# Patient Record
Sex: Female | Born: 1990 | Race: Black or African American | Hispanic: No | Marital: Single | Smoking: Current every day smoker
Health system: Southern US, Community
[De-identification: ages and names within clinical notes are randomized; demographics above are authoritative.]

## PROBLEM LIST (undated history)

## (undated) ENCOUNTER — Inpatient Hospital Stay (HOSPITAL_COMMUNITY): Payer: Self-pay

## (undated) ENCOUNTER — Emergency Department (HOSPITAL_COMMUNITY): Payer: MEDICAID

## (undated) DIAGNOSIS — Z98891 History of uterine scar from previous surgery: Secondary | ICD-10-CM

## (undated) DIAGNOSIS — E119 Type 2 diabetes mellitus without complications: Secondary | ICD-10-CM

## (undated) DIAGNOSIS — J302 Other seasonal allergic rhinitis: Secondary | ICD-10-CM

## (undated) DIAGNOSIS — F1290 Cannabis use, unspecified, uncomplicated: Secondary | ICD-10-CM

## (undated) DIAGNOSIS — J4530 Mild persistent asthma, uncomplicated: Secondary | ICD-10-CM

## (undated) DIAGNOSIS — F419 Anxiety disorder, unspecified: Secondary | ICD-10-CM

## (undated) DIAGNOSIS — R4589 Other symptoms and signs involving emotional state: Secondary | ICD-10-CM

## (undated) DIAGNOSIS — F332 Major depressive disorder, recurrent severe without psychotic features: Secondary | ICD-10-CM

## (undated) DIAGNOSIS — F329 Major depressive disorder, single episode, unspecified: Secondary | ICD-10-CM

## (undated) DIAGNOSIS — Z6791 Unspecified blood type, Rh negative: Secondary | ICD-10-CM

## (undated) DIAGNOSIS — F319 Bipolar disorder, unspecified: Secondary | ICD-10-CM

## (undated) DIAGNOSIS — O0990 Supervision of high risk pregnancy, unspecified, unspecified trimester: Secondary | ICD-10-CM

## (undated) DIAGNOSIS — O24113 Pre-existing diabetes mellitus, type 2, in pregnancy, third trimester: Secondary | ICD-10-CM

## (undated) DIAGNOSIS — Z720 Tobacco use: Secondary | ICD-10-CM

## (undated) HISTORY — PX: NO PAST SURGERIES: SHX2092

---
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
## 2009-04-22 ENCOUNTER — Inpatient Hospital Stay (HOSPITAL_COMMUNITY): Admission: AD | Admit: 2009-04-22 | Discharge: 2009-04-22 | Payer: Self-pay | Admitting: Obstetrics & Gynecology

## 2009-04-23 ENCOUNTER — Encounter: Payer: Self-pay | Admitting: Physician Assistant

## 2009-04-23 ENCOUNTER — Ambulatory Visit (HOSPITAL_COMMUNITY): Admission: RE | Admit: 2009-04-23 | Discharge: 2009-04-23 | Payer: Self-pay | Admitting: Obstetrics & Gynecology

## 2009-04-23 ENCOUNTER — Encounter (INDEPENDENT_AMBULATORY_CARE_PROVIDER_SITE_OTHER): Payer: Self-pay | Admitting: Family Medicine

## 2009-04-23 ENCOUNTER — Ambulatory Visit: Payer: Self-pay | Admitting: Obstetrics and Gynecology

## 2009-04-23 LAB — CONVERTED CEMR LAB
Basophils Relative: 0 % (ref 0–1)
Eosinophils Absolute: 0.2 10*3/uL (ref 0.0–0.7)
Eosinophils Relative: 2 % (ref 0–5)
HCT: 32 % — ABNORMAL LOW (ref 36.0–46.0)
Hepatitis B Surface Ag: NEGATIVE
Hgb F Quant: 0 % (ref 0.0–2.0)
Lymphs Abs: 1.9 10*3/uL (ref 0.7–4.0)
MCV: 89.6 fL (ref 78.0–100.0)
Neutro Abs: 7.9 10*3/uL — ABNORMAL HIGH (ref 1.7–7.7)
Neutrophils Relative %: 72 % (ref 43–77)
Rubella: 45.8 intl units/mL — ABNORMAL HIGH
WBC: 10.9 10*3/uL — ABNORMAL HIGH (ref 4.0–10.5)

## 2009-04-27 ENCOUNTER — Other Ambulatory Visit: Payer: Self-pay | Admitting: Emergency Medicine

## 2009-04-27 ENCOUNTER — Ambulatory Visit: Payer: Self-pay | Admitting: Obstetrics and Gynecology

## 2009-04-27 ENCOUNTER — Inpatient Hospital Stay (HOSPITAL_COMMUNITY): Admission: AD | Admit: 2009-04-27 | Discharge: 2009-04-27 | Payer: Self-pay | Admitting: Obstetrics & Gynecology

## 2009-05-02 ENCOUNTER — Inpatient Hospital Stay (HOSPITAL_COMMUNITY): Admission: AD | Admit: 2009-05-02 | Discharge: 2009-05-04 | Payer: Self-pay | Admitting: Obstetrics & Gynecology

## 2009-05-02 ENCOUNTER — Ambulatory Visit: Payer: Self-pay | Admitting: Obstetrics and Gynecology

## 2009-05-05 ENCOUNTER — Encounter: Admission: RE | Admit: 2009-05-05 | Discharge: 2009-05-13 | Payer: Self-pay | Admitting: Obstetrics and Gynecology

## 2009-05-05 ENCOUNTER — Ambulatory Visit: Payer: Self-pay | Admitting: Obstetrics & Gynecology

## 2009-05-05 ENCOUNTER — Encounter: Payer: Self-pay | Admitting: Family

## 2009-05-05 LAB — CONVERTED CEMR LAB: GC Probe Amp, Urine: NEGATIVE

## 2009-05-06 ENCOUNTER — Encounter: Payer: Self-pay | Admitting: Family

## 2009-05-09 ENCOUNTER — Ambulatory Visit: Payer: Self-pay | Admitting: Obstetrics & Gynecology

## 2009-05-12 ENCOUNTER — Encounter: Payer: Self-pay | Admitting: Obstetrics & Gynecology

## 2009-05-12 ENCOUNTER — Ambulatory Visit: Payer: Self-pay | Admitting: Obstetrics & Gynecology

## 2009-05-13 ENCOUNTER — Ambulatory Visit: Payer: Self-pay | Admitting: Family Medicine

## 2009-05-13 ENCOUNTER — Inpatient Hospital Stay (HOSPITAL_COMMUNITY): Admission: AD | Admit: 2009-05-13 | Discharge: 2009-05-13 | Payer: Self-pay | Admitting: Family Medicine

## 2009-05-16 ENCOUNTER — Ambulatory Visit: Payer: Self-pay | Admitting: Obstetrics & Gynecology

## 2009-05-19 ENCOUNTER — Ambulatory Visit: Payer: Self-pay | Admitting: Obstetrics & Gynecology

## 2009-05-23 ENCOUNTER — Ambulatory Visit: Payer: Self-pay | Admitting: Family Medicine

## 2009-05-23 ENCOUNTER — Ambulatory Visit: Payer: Self-pay | Admitting: Obstetrics & Gynecology

## 2009-05-23 ENCOUNTER — Ambulatory Visit (HOSPITAL_COMMUNITY): Admission: RE | Admit: 2009-05-23 | Discharge: 2009-05-23 | Payer: Self-pay | Admitting: Obstetrics & Gynecology

## 2009-05-23 ENCOUNTER — Inpatient Hospital Stay (HOSPITAL_COMMUNITY): Admission: AD | Admit: 2009-05-23 | Discharge: 2009-05-26 | Payer: Self-pay | Admitting: Obstetrics & Gynecology

## 2009-10-28 ENCOUNTER — Emergency Department (HOSPITAL_COMMUNITY): Admission: EM | Admit: 2009-10-28 | Discharge: 2009-10-29 | Payer: Self-pay | Admitting: Emergency Medicine

## 2010-03-29 ENCOUNTER — Encounter: Payer: Self-pay | Admitting: Family Medicine

## 2010-05-22 LAB — CBC
HCT: 36.4 % (ref 36.0–46.0)
MCHC: 34.1 g/dL (ref 30.0–36.0)
MCV: 85.2 fL (ref 78.0–100.0)
RDW: 14.3 % (ref 11.5–15.5)
WBC: 10.8 10*3/uL — ABNORMAL HIGH (ref 4.0–10.5)

## 2010-05-22 LAB — DIFFERENTIAL
Basophils Absolute: 0 10*3/uL (ref 0.0–0.1)
Basophils Relative: 0 % (ref 0–1)
Eosinophils Relative: 3 % (ref 0–5)
Lymphocytes Relative: 33 % (ref 12–46)
Neutrophils Relative %: 58 % (ref 43–77)

## 2010-05-22 LAB — POCT I-STAT, CHEM 8
BUN: 8 mg/dL (ref 6–23)
Glucose, Bld: 119 mg/dL — ABNORMAL HIGH (ref 70–99)

## 2010-05-26 ENCOUNTER — Emergency Department (HOSPITAL_COMMUNITY): Payer: Medicaid Other

## 2010-05-26 ENCOUNTER — Emergency Department (HOSPITAL_COMMUNITY)
Admission: EM | Admit: 2010-05-26 | Discharge: 2010-05-26 | Disposition: A | Discharge status: Home / Self Care | Payer: Medicaid Other | Attending: Emergency Medicine | Admitting: Emergency Medicine

## 2010-05-26 DIAGNOSIS — J40 Bronchitis, not specified as acute or chronic: Secondary | ICD-10-CM | POA: Insufficient documentation

## 2010-05-26 DIAGNOSIS — R112 Nausea with vomiting, unspecified: Secondary | ICD-10-CM | POA: Insufficient documentation

## 2010-05-26 DIAGNOSIS — R0989 Other specified symptoms and signs involving the circulatory and respiratory systems: Secondary | ICD-10-CM | POA: Insufficient documentation

## 2010-05-26 DIAGNOSIS — R0609 Other forms of dyspnea: Secondary | ICD-10-CM | POA: Insufficient documentation

## 2010-05-26 DIAGNOSIS — Z79899 Other long term (current) drug therapy: Secondary | ICD-10-CM | POA: Insufficient documentation

## 2010-05-26 DIAGNOSIS — R059 Cough, unspecified: Secondary | ICD-10-CM | POA: Insufficient documentation

## 2010-05-26 DIAGNOSIS — F3289 Other specified depressive episodes: Secondary | ICD-10-CM | POA: Insufficient documentation

## 2010-05-26 DIAGNOSIS — R0602 Shortness of breath: Secondary | ICD-10-CM | POA: Insufficient documentation

## 2010-05-26 DIAGNOSIS — R05 Cough: Secondary | ICD-10-CM | POA: Insufficient documentation

## 2010-05-26 DIAGNOSIS — J45901 Unspecified asthma with (acute) exacerbation: Secondary | ICD-10-CM | POA: Insufficient documentation

## 2010-05-26 DIAGNOSIS — E119 Type 2 diabetes mellitus without complications: Secondary | ICD-10-CM | POA: Insufficient documentation

## 2010-05-26 DIAGNOSIS — F329 Major depressive disorder, single episode, unspecified: Secondary | ICD-10-CM | POA: Insufficient documentation

## 2010-05-26 DIAGNOSIS — Z794 Long term (current) use of insulin: Secondary | ICD-10-CM | POA: Insufficient documentation

## 2010-05-26 LAB — DIFFERENTIAL
Basophils Absolute: 0.1 10*3/uL (ref 0.0–0.1)
Eosinophils Absolute: 0.4 10*3/uL (ref 0.0–0.7)
Lymphocytes Relative: 29 % (ref 12–46)
Monocytes Absolute: 0.9 10*3/uL (ref 0.1–1.0)
Monocytes Relative: 11 % (ref 3–12)
Neutro Abs: 4.4 10*3/uL (ref 1.7–7.7)

## 2010-05-26 LAB — URINALYSIS, ROUTINE W REFLEX MICROSCOPIC
Glucose, UA: NEGATIVE mg/dL
Hgb urine dipstick: NEGATIVE
Specific Gravity, Urine: 1.015 (ref 1.005–1.030)
pH: 6.5 (ref 5.0–8.0)

## 2010-05-26 LAB — BASIC METABOLIC PANEL
BUN: 4 mg/dL — ABNORMAL LOW (ref 6–23)
CO2: 24 mEq/L (ref 19–32)

## 2010-05-26 LAB — CBC
MCH: 25.6 pg — ABNORMAL LOW (ref 26.0–34.0)
MCV: 79.2 fL (ref 78.0–100.0)
Platelets: 362 10*3/uL (ref 150–400)
RBC: 4.18 MIL/uL (ref 3.87–5.11)
WBC: 8.1 10*3/uL (ref 4.0–10.5)

## 2010-05-26 LAB — URINE MICROSCOPIC-ADD ON

## 2010-05-26 LAB — POCT PREGNANCY, URINE: Preg Test, Ur: NEGATIVE

## 2010-05-27 LAB — URINE CULTURE
Colony Count: 75000
Culture  Setup Time: 201203201444

## 2010-05-27 LAB — GLUCOSE, CAPILLARY
Glucose-Capillary: 108 mg/dL — ABNORMAL HIGH (ref 70–99)
Glucose-Capillary: 128 mg/dL — ABNORMAL HIGH (ref 70–99)
Glucose-Capillary: 138 mg/dL — ABNORMAL HIGH (ref 70–99)
Glucose-Capillary: 178 mg/dL — ABNORMAL HIGH (ref 70–99)
Glucose-Capillary: 178 mg/dL — ABNORMAL HIGH (ref 70–99)
Glucose-Capillary: 204 mg/dL — ABNORMAL HIGH (ref 70–99)
Glucose-Capillary: 212 mg/dL — ABNORMAL HIGH (ref 70–99)

## 2010-05-27 LAB — POCT URINALYSIS DIP (DEVICE)
Bilirubin Urine: NEGATIVE
Glucose, UA: 250 mg/dL — AB
Nitrite: NEGATIVE
Protein, ur: 100 mg/dL — AB
Protein, ur: 100 mg/dL — AB
Specific Gravity, Urine: 1.02 (ref 1.005–1.030)
Specific Gravity, Urine: 1.02 (ref 1.005–1.030)

## 2010-05-27 LAB — RH IMMUNE GLOBULIN WORKUP (NOT WOMEN'S HOSP): ABO/RH(D): A NEG

## 2010-05-27 LAB — CBC
Hemoglobin: 11.1 g/dL — ABNORMAL LOW (ref 12.0–15.0)
MCHC: 34.1 g/dL (ref 30.0–36.0)
MCV: 91.6 fL (ref 78.0–100.0)
RBC: 3.57 MIL/uL — ABNORMAL LOW (ref 3.87–5.11)
WBC: 6.5 10*3/uL (ref 4.0–10.5)

## 2010-05-29 LAB — POCT I-STAT, CHEM 8
Calcium, Ion: 1.07 mmol/L — ABNORMAL LOW (ref 1.12–1.32)
Glucose, Bld: 157 mg/dL — ABNORMAL HIGH (ref 70–99)
HCT: 32 % — ABNORMAL LOW (ref 36.0–46.0)
Hemoglobin: 10.9 g/dL — ABNORMAL LOW (ref 12.0–15.0)
Potassium: 3.8 mEq/L (ref 3.5–5.1)

## 2010-05-29 LAB — URINALYSIS, ROUTINE W REFLEX MICROSCOPIC
Bilirubin Urine: NEGATIVE
Ketones, ur: NEGATIVE mg/dL
Nitrite: NEGATIVE
Specific Gravity, Urine: 1.022 (ref 1.005–1.030)
Urobilinogen, UA: 0.2 mg/dL (ref 0.0–1.0)

## 2010-05-29 LAB — CBC
MCHC: 34.6 g/dL (ref 30.0–36.0)
MCV: 92.8 fL (ref 78.0–100.0)
RBC: 3.64 MIL/uL — ABNORMAL LOW (ref 3.87–5.11)
RDW: 13.8 % (ref 11.5–15.5)

## 2010-05-29 LAB — DIFFERENTIAL
Basophils Absolute: 0 10*3/uL (ref 0.0–0.1)
Basophils Relative: 0 % (ref 0–1)
Eosinophils Absolute: 0.1 10*3/uL (ref 0.0–0.7)
Monocytes Relative: 4 % (ref 3–12)
Neutro Abs: 9.1 10*3/uL — ABNORMAL HIGH (ref 1.7–7.7)
Neutrophils Relative %: 89 % — ABNORMAL HIGH (ref 43–77)

## 2010-05-29 LAB — GLUCOSE, CAPILLARY: Glucose-Capillary: 128 mg/dL — ABNORMAL HIGH (ref 70–99)

## 2010-05-31 LAB — GLUCOSE, CAPILLARY
Glucose-Capillary: 109 mg/dL — ABNORMAL HIGH (ref 70–99)
Glucose-Capillary: 110 mg/dL — ABNORMAL HIGH (ref 70–99)
Glucose-Capillary: 120 mg/dL — ABNORMAL HIGH (ref 70–99)
Glucose-Capillary: 129 mg/dL — ABNORMAL HIGH (ref 70–99)
Glucose-Capillary: 133 mg/dL — ABNORMAL HIGH (ref 70–99)
Glucose-Capillary: 138 mg/dL — ABNORMAL HIGH (ref 70–99)
Glucose-Capillary: 160 mg/dL — ABNORMAL HIGH (ref 70–99)
Glucose-Capillary: 162 mg/dL — ABNORMAL HIGH (ref 70–99)
Glucose-Capillary: 189 mg/dL — ABNORMAL HIGH (ref 70–99)
Glucose-Capillary: 243 mg/dL — ABNORMAL HIGH (ref 70–99)
Glucose-Capillary: 244 mg/dL — ABNORMAL HIGH (ref 70–99)
Glucose-Capillary: 86 mg/dL (ref 70–99)
Glucose-Capillary: 98 mg/dL (ref 70–99)

## 2010-05-31 LAB — RH IMMUNE GLOB WKUP(>/=20WKS)(NOT WOMEN'S HOSP)

## 2010-05-31 LAB — POCT URINALYSIS DIP (DEVICE)
Bilirubin Urine: NEGATIVE
Glucose, UA: 100 mg/dL — AB
Ketones, ur: NEGATIVE mg/dL
Ketones, ur: NEGATIVE mg/dL
Nitrite: NEGATIVE
Protein, ur: 30 mg/dL — AB
Urobilinogen, UA: 0.2 mg/dL (ref 0.0–1.0)
pH: 7 (ref 5.0–8.0)

## 2010-05-31 LAB — WET PREP, GENITAL
Clue Cells Wet Prep HPF POC: NONE SEEN
Trich, Wet Prep: NONE SEEN
Yeast Wet Prep HPF POC: NONE SEEN

## 2010-05-31 LAB — CBC
HCT: 33.7 % — ABNORMAL LOW (ref 36.0–46.0)
Platelets: 248 10*3/uL (ref 150–400)
WBC: 9.4 10*3/uL (ref 4.0–10.5)

## 2010-06-12 LAB — POCT I-STAT, CHEM 8
Hemoglobin: 12.2 g/dL (ref 12.0–15.0)
Sodium: 137 mEq/L (ref 135–145)
TCO2: 22 mmol/L (ref 0–100)

## 2010-06-12 LAB — URINALYSIS, ROUTINE W REFLEX MICROSCOPIC
Ketones, ur: NEGATIVE mg/dL
Nitrite: NEGATIVE
Protein, ur: NEGATIVE mg/dL
pH: 6.5 (ref 5.0–8.0)

## 2010-06-12 LAB — URINE CULTURE: Colony Count: NO GROWTH

## 2010-06-12 LAB — URINE MICROSCOPIC-ADD ON

## 2010-06-18 LAB — CBC
HCT: 33.7 % — ABNORMAL LOW (ref 36.0–46.0)
Hemoglobin: 11.4 g/dL — ABNORMAL LOW (ref 12.0–15.0)
MCHC: 33.8 g/dL (ref 30.0–36.0)
Platelets: 252 10*3/uL (ref 150–400)
RDW: 13.9 % (ref 11.5–15.5)
WBC: 9.1 10*3/uL (ref 4.0–10.5)

## 2010-06-18 LAB — GLUCOSE, CAPILLARY
Glucose-Capillary: 117 mg/dL — ABNORMAL HIGH (ref 70–99)
Glucose-Capillary: 117 mg/dL — ABNORMAL HIGH (ref 70–99)
Glucose-Capillary: 189 mg/dL — ABNORMAL HIGH (ref 70–99)
Glucose-Capillary: 284 mg/dL — ABNORMAL HIGH (ref 70–99)
Glucose-Capillary: 87 mg/dL (ref 70–99)

## 2010-06-18 LAB — RH IMMUNE GLOB WKUP(>/=20WKS)(NOT WOMEN'S HOSP)

## 2010-06-18 LAB — RAPID HIV SCREEN (WH-MAU): Rapid HIV Screen: NONREACTIVE

## 2010-06-18 LAB — LSPG (L/S RATIO WITH PG)-AMNIO FLUID

## 2010-06-18 LAB — GLUCOSE, RANDOM: Glucose, Bld: 290 mg/dL — ABNORMAL HIGH (ref 70–99)

## 2010-06-18 LAB — RPR: RPR Ser Ql: NONREACTIVE

## 2010-06-22 LAB — RH IMMUNE GLOBULIN WORKUP (NOT WOMEN'S HOSP): Antibody Screen: NEGATIVE

## 2010-06-23 LAB — COMPREHENSIVE METABOLIC PANEL
ALT: 15 U/L (ref 0–35)
Alkaline Phosphatase: 66 U/L (ref 39–117)
CO2: 23 mEq/L (ref 19–32)
Calcium: 9.1 mg/dL (ref 8.4–10.5)
Chloride: 106 mEq/L (ref 96–112)
GFR calc non Af Amer: >60 mL/min (ref >60)
Glucose, Bld: 145 mg/dL — ABNORMAL HIGH (ref 70–99)
Potassium: 3.7 mEq/L (ref 3.5–5.1)
Sodium: 135 mEq/L (ref 135–145)
Total Bilirubin: <0.1 mg/dL — ABNORMAL LOW (ref 0.3–1.2)

## 2010-06-23 LAB — GLUCOSE, CAPILLARY
Glucose-Capillary: 109 mg/dL — ABNORMAL HIGH (ref 70–99)
Glucose-Capillary: 126 mg/dL — ABNORMAL HIGH (ref 70–99)
Glucose-Capillary: 137 mg/dL — ABNORMAL HIGH (ref 70–99)
Glucose-Capillary: 141 mg/dL — ABNORMAL HIGH (ref 70–99)
Glucose-Capillary: 82 mg/dL (ref 70–99)

## 2010-06-23 LAB — CBC
Hemoglobin: 10.5 g/dL — ABNORMAL LOW (ref 12.0–15.0)
MCHC: 33.9 g/dL (ref 30.0–36.0)
RBC: 3.28 MIL/uL — ABNORMAL LOW (ref 3.87–5.11)
WBC: 9.3 10*3/uL (ref 4.0–10.5)

## 2010-06-23 LAB — HEMOGLOBIN A1C: Hgb A1c MFr Bld: 6.6 % — ABNORMAL HIGH (ref 4.6–6.1)

## 2010-07-21 NOTE — Discharge Summary (Signed)
Grace Montgomery, Grace Montgomery              ACCOUNT NO.:  [id number]   MEDICAL RECORD NO.:  [id number]         PATIENT TYPE:  WINP   LOCATION:                                FACILITY:  [facility]   PHYSICIAN:  Kathreen Cosier, M.D.DATE OF BIRTH:  1990/07/07   DATE OF ADMISSION:  04/27/2008  DATE OF DISCHARGE:                               DISCHARGE SUMMARY   The patient is an 20 year old gravida 2, para 1-0-0-1, [facility] June 03, 2008.  The patient is a known diabetic, was seen at [facility].  On  the day of admission, she was seen in [facility] and she was given  prescription for her insulin.  The patient states she was unable to  afford medication and was sent to the hospital by the group home where  she lived.  While hospitalized, her blood sugars were under control.  She had a social work consult.  She was on 40 units of Lantus at bedtime  and 10 units of Humalog with meals and we were able to arrange for her  to have her medication prior to discharge on April 29, 2008.   DISCHARGE DIAGNOSIS:  Intrauterine pregnancy with gestational diabetes.           ______________________________  Kathreen Cosier, M.D.     BAM/MEDQ  D:  05/22/2008  T:  05/22/2008  Job:  [id number]

## 2010-08-07 ENCOUNTER — Emergency Department (HOSPITAL_COMMUNITY)
Admission: EM | Admit: 2010-08-07 | Discharge: 2010-08-07 | Discharge status: Against Medical Advice | Payer: Self-pay | Attending: Emergency Medicine | Admitting: Emergency Medicine

## 2010-08-07 DIAGNOSIS — R071 Chest pain on breathing: Secondary | ICD-10-CM | POA: Insufficient documentation

## 2010-12-11 LAB — CBC
HCT: 33.7 % — ABNORMAL LOW (ref 36.0–46.0)
MCHC: 34.7 g/dL (ref 30.0–36.0)
MCV: 94.3 fL (ref 78.0–100.0)
RBC: 3.57 MIL/uL — ABNORMAL LOW (ref 3.87–5.11)
WBC: 9.8 10*3/uL (ref 4.0–10.5)

## 2010-12-11 LAB — URINALYSIS, ROUTINE W REFLEX MICROSCOPIC
Hgb urine dipstick: NEGATIVE
Ketones, ur: 40 mg/dL — AB
Protein, ur: 30 mg/dL — AB
Urobilinogen, UA: 0.2 mg/dL (ref 0.0–1.0)

## 2010-12-11 LAB — COMPREHENSIVE METABOLIC PANEL
BUN: 5 mg/dL — ABNORMAL LOW (ref 6–23)
CO2: 21 mEq/L (ref 19–32)
Chloride: 104 mEq/L (ref 96–112)
Creatinine, Ser: 0.69 mg/dL (ref 0.4–1.2)
GFR calc non Af Amer: >60 mL/min (ref >60)
Total Bilirubin: 0.7 mg/dL (ref 0.3–1.2)

## 2010-12-11 LAB — WET PREP, GENITAL
Trich, Wet Prep: NONE SEEN
Yeast Wet Prep HPF POC: NONE SEEN

## 2010-12-11 LAB — URINE MICROSCOPIC-ADD ON

## 2010-12-22 LAB — RH IMMUNE GLOBULIN WORKUP (NOT WOMEN'S HOSP)
ABO/RH(D): A NEG
Antibody Screen: NEGATIVE

## 2010-12-22 LAB — CBC
HCT: 34.2 — ABNORMAL LOW
Hemoglobin: 11.7 — ABNORMAL LOW
MCHC: 34.1
MCV: 95.8
Platelets: 260
RBC: 3.57 — ABNORMAL LOW
RDW: 13.3
WBC: 11.1 — ABNORMAL HIGH

## 2010-12-22 LAB — KLEIHAUER-BETKE STAIN: Quantitation Fetal Hemoglobin: 0

## 2010-12-23 LAB — URINALYSIS, ROUTINE W REFLEX MICROSCOPIC
Glucose, UA: 100 — AB
Hgb urine dipstick: NEGATIVE
Ketones, ur: 15 — AB
Nitrite: NEGATIVE
Protein, ur: NEGATIVE
Specific Gravity, Urine: 1.01
pH: 7

## 2010-12-23 LAB — DIFFERENTIAL
Lymphs Abs: 1.8
Monocytes Relative: 7
Neutro Abs: 7.8 — ABNORMAL HIGH
Neutrophils Relative %: 73 — ABNORMAL HIGH

## 2010-12-23 LAB — HIV ANTIBODY (ROUTINE TESTING W REFLEX): HIV: NONREACTIVE

## 2010-12-23 LAB — CBC
RBC: 3.63 — ABNORMAL LOW
WBC: 10.8 — ABNORMAL HIGH

## 2010-12-23 LAB — ABO/RH: ABO/RH(D): A NEG

## 2010-12-23 LAB — WET PREP, GENITAL: Yeast Wet Prep HPF POC: NONE SEEN

## 2010-12-23 LAB — HEPATITIS B SURFACE ANTIGEN: Hepatitis B Surface Ag: NEGATIVE

## 2010-12-23 LAB — RPR
RPR Ser Ql: NONREACTIVE
RPR Ser Ql: NONREACTIVE

## 2011-02-19 ENCOUNTER — Encounter: Payer: Self-pay | Admitting: *Deleted

## 2011-02-19 ENCOUNTER — Emergency Department (HOSPITAL_COMMUNITY)
Admission: EM | Admit: 2011-02-19 | Discharge: 2011-02-19 | Disposition: A | Discharge status: Home / Self Care | Payer: Medicaid Other | Attending: Emergency Medicine | Admitting: Emergency Medicine

## 2011-02-19 DIAGNOSIS — R0981 Nasal congestion: Secondary | ICD-10-CM

## 2011-02-19 DIAGNOSIS — Z794 Long term (current) use of insulin: Secondary | ICD-10-CM | POA: Insufficient documentation

## 2011-02-19 DIAGNOSIS — J3489 Other specified disorders of nose and nasal sinuses: Secondary | ICD-10-CM | POA: Insufficient documentation

## 2011-02-19 DIAGNOSIS — R0789 Other chest pain: Secondary | ICD-10-CM | POA: Insufficient documentation

## 2011-02-19 DIAGNOSIS — E119 Type 2 diabetes mellitus without complications: Secondary | ICD-10-CM | POA: Insufficient documentation

## 2011-02-19 DIAGNOSIS — B9789 Other viral agents as the cause of diseases classified elsewhere: Secondary | ICD-10-CM | POA: Insufficient documentation

## 2011-02-19 DIAGNOSIS — F17200 Nicotine dependence, unspecified, uncomplicated: Secondary | ICD-10-CM | POA: Insufficient documentation

## 2011-02-19 DIAGNOSIS — B349 Viral infection, unspecified: Secondary | ICD-10-CM

## 2011-02-19 MED ORDER — MOMETASONE FUROATE 50 MCG/ACT NA SUSP: 2.0000 | Freq: Every day | NASAL | Status: DC

## 2011-02-19 MED ORDER — PSEUDOEPHEDRINE HCL 60 MG PO TABS: 60.0000 mg | ORAL_TABLET | ORAL | Status: AC | PRN

## 2011-02-19 NOTE — ED Provider Notes (Signed)
Medical screening examination/treatment/procedure(s) were performed by non-physician practitioner and as supervising physician I was immediately available for consultation/collaboration.  Ethelda Chick, MD 02/19/11 [phone]

## 2011-02-19 NOTE — ED Notes (Signed)
Pt states "for the past 2 days I have been a little congested & my chest feels a little tight, have nasal congestion & feeling a little dizzy, those spells come & go"; pt denies cough/n/v

## 2011-02-19 NOTE — ED Provider Notes (Signed)
History     CSN: [id number] Arrival date & time: 02/19/2011 10:32 AM   First MD Initiated Contact with Patient 02/19/11 1111      Chief Complaint  Patient presents with  . URI    (Consider location/radiation/quality/duration/timing/severity/associated sxs/prior treatment) HPI Comments: Patient states she's felt congestion for the last 4-5 days.  She does not have a primary care Dr.  She states that she's been having night sweats and chills.  She denies nausea, vomiting, diarrhea, headaches, change in vision, sore throat, cough and chest pain.  Patient is a 20 y.o. female presenting with URI. The history is provided by the patient.  URI Primary symptoms do not include fever, fatigue, headaches, ear pain, sore throat, swollen glands, cough, wheezing, abdominal pain, nausea, vomiting, myalgias, arthralgias or rash. The current episode started 3 to 5 days ago. This is a new problem. The problem has not changed since onset. The onset of the illness is associated with exposure to sick contacts. Symptoms associated with the illness include chills, congestion and rhinorrhea. The illness is not associated with plugged ear sensation, facial pain or sinus pressure.    Past Medical History  Diagnosis Date  . Diabetes mellitus   . Asthma     History reviewed. No pertinent past surgical history.  No family history on file.  History  Substance Use Topics  . Smoking status: Current Everyday Smoker -- 0.5 packs/day  . Smokeless tobacco: Not on file  . Alcohol Use: Yes     ocassionally    OB History    Grav Para Term Preterm Abortions TAB SAB Ect Mult Living                  Review of Systems  Constitutional: Positive for chills. Negative for fever, appetite change and fatigue.  HENT: Positive for congestion and rhinorrhea. Negative for hearing loss, ear pain, nosebleeds, sore throat, sneezing, trouble swallowing, neck stiffness, voice change, postnasal drip, sinus pressure, tinnitus  and ear discharge.   Eyes: Negative for photophobia and visual disturbance.  Respiratory: Positive for chest tightness. Negative for apnea, cough, choking, shortness of breath, wheezing and stridor.   Cardiovascular: Negative for chest pain, palpitations and leg swelling.  Gastrointestinal: Negative for nausea, vomiting, abdominal pain, diarrhea and constipation.  Genitourinary: Negative for dysuria, urgency and flank pain.  Musculoskeletal: Negative for myalgias and arthralgias.  Skin: Negative for rash.  Neurological: Negative for dizziness, seizures, syncope, weakness, light-headedness, numbness and headaches.  Psychiatric/Behavioral: Negative for behavioral problems and confusion.  All other systems reviewed and are negative.    Allergies  Chocolate  Home Medications   Current Outpatient Rx  Name Route Sig Dispense Refill  . ALBUTEROL 90 MCG/ACT IN AERS Inhalation Inhale 2 puffs into the lungs every 6 (six) hours as needed. wheezing     . INSULIN ASPART 100 UNIT/ML [place] SOLN Subcutaneous Inject 36-42 Units into the skin 3 (three) times daily before meals. 42 units in the morning,24 units in the afternoon,36 units before supper     . INSULIN GLARGINE 100 UNIT/ML [place] SOLN Subcutaneous Inject 42 Units into the skin daily.        BP 124/64  Pulse 83  Temp(Src) 98.9 F (37.2 C) (Oral)  Resp 16  Wt 198 lb (89.812 kg)  SpO2 100%  LMP 02/16/2011  Physical Exam  Nursing note and vitals reviewed. Constitutional: She is oriented to person, place, and time. She appears well-developed and well-nourished. No distress.  HENT:  Head: Normocephalic and  atraumatic. No trismus in the jaw.  Right Ear: Tympanic membrane, external ear and ear canal normal.  Left Ear: Tympanic membrane, external ear and ear canal normal.  Nose: Rhinorrhea present. No sinus tenderness or nasal deformity. Right sinus exhibits no maxillary sinus tenderness and no frontal sinus tenderness. Left sinus exhibits no  maxillary sinus tenderness and no frontal sinus tenderness.  Mouth/Throat: Uvula is midline and mucous membranes are normal. No oropharyngeal exudate, posterior oropharyngeal edema or posterior oropharyngeal erythema.  Eyes:       Normal appearance  Neck: Normal range of motion. Neck supple.  Cardiovascular: Normal rate and regular rhythm.   Pulmonary/Chest: Effort normal and breath sounds normal.  Abdominal: There is no tenderness.  Lymphadenopathy:    She has no cervical adenopathy.  Neurological: She is alert and oriented to person, place, and time.  Skin: Skin is warm and dry. No rash noted.  Psychiatric: She has a normal mood and affect. Her behavior is normal.    ED Course  Procedures (including critical care time)  Labs Reviewed - No data to display No results found.   No diagnosis found.  Patient's lungs are clear to auscultation anteriorly and posteriorly.  She is hemodynamically stable and is in no apparent distress.  Discussion of time of onset of symptoms and cause for his benefit of Tamiflu was discussed with the patient who states that she does not want her prescription.  Patient has asked for recommendations for over-the-counter decongestants.  Patient has no other complaints.  MDM  Viral syndrome, nasal congestion         Jaci Carrel, [place] 02/19/11 1151

## 2011-04-24 ENCOUNTER — Other Ambulatory Visit: Payer: Self-pay

## 2011-04-24 ENCOUNTER — Emergency Department (HOSPITAL_COMMUNITY)
Admission: EM | Admit: 2011-04-24 | Discharge: 2011-04-25 | Disposition: A | Discharge status: Other Acute Inpatient Hospital | Payer: Medicaid Other | Source: Home / Self Care | Attending: Emergency Medicine | Admitting: Emergency Medicine

## 2011-04-24 ENCOUNTER — Encounter (HOSPITAL_COMMUNITY): Payer: Self-pay | Admitting: *Deleted

## 2011-04-24 DIAGNOSIS — Z794 Long term (current) use of insulin: Secondary | ICD-10-CM | POA: Insufficient documentation

## 2011-04-24 DIAGNOSIS — T50902A Poisoning by unspecified drugs, medicaments and biological substances, intentional self-harm, initial encounter: Secondary | ICD-10-CM | POA: Insufficient documentation

## 2011-04-24 DIAGNOSIS — T50901A Poisoning by unspecified drugs, medicaments and biological substances, accidental (unintentional), initial encounter: Secondary | ICD-10-CM

## 2011-04-24 DIAGNOSIS — F329 Major depressive disorder, single episode, unspecified: Secondary | ICD-10-CM | POA: Insufficient documentation

## 2011-04-24 DIAGNOSIS — E119 Type 2 diabetes mellitus without complications: Secondary | ICD-10-CM | POA: Insufficient documentation

## 2011-04-24 DIAGNOSIS — T1491XA Suicide attempt, initial encounter: Secondary | ICD-10-CM

## 2011-04-24 DIAGNOSIS — F3289 Other specified depressive episodes: Secondary | ICD-10-CM | POA: Insufficient documentation

## 2011-04-24 DIAGNOSIS — R404 Transient alteration of awareness: Secondary | ICD-10-CM | POA: Insufficient documentation

## 2011-04-24 HISTORY — DX: Major depressive disorder, single episode, unspecified: F32.9

## 2011-04-24 HISTORY — DX: Anxiety disorder, unspecified: F41.9

## 2011-04-24 NOTE — ED Notes (Signed)
Pt denies SI/HI 

## 2011-04-24 NOTE — ED Provider Notes (Signed)
History     CSN: [id number]  Arrival date & time 04/24/11  2304   First MD Initiated Contact with Patient 04/24/11 2312      Chief Complaint  Patient presents with  . Ingestion    (Consider location/radiation/quality/duration/timing/severity/associated sxs/prior treatment) HPI Comments: 21 year old female with a history of psychiatric disorder of unknown diagnosis who states that she does take psychiatric medications as well as a sleep medication. She presents by ambulance after taking an unknown medication and overdose. The patient states that she had only taken a sleep medicine and her purpose was to go to sleep because she has not been sleeping well. According to a caseworker who helps her with psychiatric disorders he received Messages from her stating that she wanted to end her life and that she was tired of living.  The patient admits to taking the medication approximately 3 hours prior to but denies any coingestants. The patient is excessively sleepy and somnolent and was found not speaking very much by the caseworker. Symptoms are severe, nothing makes better or worse, patient denies suicidal thoughts though text messages suggest otherwise.  Patient does have a history of diabetes but admits that she has not been taking her insulin. She cannot remember the name of any of her medications.  My review of the medical record suggests that the patient does have a psychiatric history and she states that she had not taken her mental health medication from a visit in March of 2012. She recorded insulin as a medication as far back as approximately 1 year ago.  Patient also admits to recent miscarriage.  Denies chest pain, shortness of breath, headache, swelling, back pain, abdominal pain, vaginal bleeding, fevers chills nausea or vomit  Patient is a 21 y.o. female presenting with Ingested Medication. The history is provided by the patient, medical records, the EMS personnel and a friend.   Ingestion    Past Medical History  Diagnosis Date  . Diabetes mellitus   . Asthma   . 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 Anxiety   . Cannabis dependence   . Alcohol abuse     History reviewed. No pertinent past surgical history.  No family history on file.  History  Substance Use Topics  . Smoking status: Current Everyday Smoker -- 0.5 packs/day  . Smokeless tobacco: Not on file  . Alcohol Use: Yes     ocassionally    OB History    Grav Para Term Preterm Abortions TAB SAB Ect Mult Living                  Review of Systems  All other systems reviewed and are negative.    Allergies  Chocolate  Home Medications   Current Outpatient Rx  Name Route Sig Dispense Refill  . ALBUTEROL 90 MCG/ACT IN AERS Inhalation Inhale 2 puffs into the lungs every 6 (six) hours as needed. wheezing     . INSULIN ASPART 100 UNIT/ML [place] SOLN Subcutaneous Inject 36-42 Units into the skin 3 (three) times daily before meals. 42 units in the morning,24 units in the afternoon,36 units before supper     . MOMETASONE FUROATE 50 MCG/ACT NA SUSP Nasal Place 2 sprays into the nose daily. 17 g 12  . NAPROXEN SODIUM 220 MG PO CAPS Oral Take 2 capsules by mouth daily as needed.      BP 126/71  Pulse 92  Temp(Src) 98.9 F (37.2 C) (Oral)  Resp 16  SpO2 99%  Physical Exam  Nursing note and vitals reviewed. Constitutional: She appears well-developed and well-nourished.       Somnolent but easily arousable  HENT:  Head: Normocephalic and atraumatic.  Mouth/Throat: Oropharynx is clear and moist. No oropharyngeal exudate.  Eyes: Conjunctivae and EOM are normal. Pupils are equal, round, and reactive to light. Right eye exhibits no discharge. Left eye exhibits no discharge. No scleral icterus.  Neck: Normal range of motion. Neck supple. No JVD present. No thyromegaly present.  Cardiovascular: Normal rate, regular rhythm, normal heart sounds and intact distal pulses.   Exam reveals no gallop and no friction rub.   No murmur heard. Pulmonary/Chest: Effort normal and breath sounds normal. No respiratory distress. She has no wheezes. She has no rales.  Abdominal: Soft. Bowel sounds are normal. She exhibits no distension and no mass. There is no tenderness.  Musculoskeletal: Normal range of motion. She exhibits no edema and no tenderness.  Lymphadenopathy:    She has no cervical adenopathy.  Neurological: Coordination normal.       Sleepy appearing, follows commands, speech clear, cranial nerves III through XII intact, strength and sensation at baseline, moves all extremities x4, follows commands  Skin: Skin is warm and dry. No rash noted. No erythema.  Psychiatric:       Depression, suicidal thoughts    ED Course  Procedures (including critical care time)  ED ECG REPORT   Date: 04/25/2011   Rate: 77  Rhythm: normal sinus rhythm  QRS Axis: normal  Intervals: normal  ST/T Wave abnormalities: normal  Conduction Disutrbances:none  Narrative Interpretation:   Old EKG Reviewed: none available   Labs Reviewed  CBC - Abnormal; Notable for the following:    Hemoglobin 8.3 (*)    HCT 27.3 (*)    MCV 68.9 (*)    MCH 21.0 (*)    RDW 18.5 (*)    All other components within normal limits  COMPREHENSIVE METABOLIC PANEL - Abnormal; Notable for the following:    Glucose, Bld 130 (*)    Albumin 3.4 (*)    Total Bilirubin 0.1 (*)    All other components within normal limits  SALICYLATE LEVEL - Abnormal; Notable for the following:    Salicylate Lvl <2.0 (*)    All other components within normal limits  ETHANOL  ACETAMINOPHEN LEVEL  URINE RAPID DRUG SCREEN (HOSP PERFORMED)  URINALYSIS, ROUTINE W REFLEX MICROSCOPIC  PREGNANCY, URINE   No results found.   1. Overdose   2. Suicide attempt       MDM  At this time patient has normal vital signs including blood pressure 126/71, pulse of 85 and temperature of 98.9 with oxygen of 100% on room air.  We'll check for coingestants, EKG, observation, anticipate admission for intentional overdose and suicidal intent   12:38 AM  Discussed care with behavioral health assessment number who agrees to evaluate in ED.  Patient has been easily arousable her entire stay,  lab results show normal renal function, normal liver function, and detectable alcohol acetaminophen and salicylate. White blood cell count 10.2, hemoglobin 8.3 which is approximately 2 g lower than normal. She denies any other sources of bleeding. Will Hemoccult stools.   IVC completed by myself  Change of shift - care signed out to Dr. Ranae Palms at 8 AM  Vida Roller, MD 04/25/11 1539

## 2011-04-24 NOTE — ED Notes (Signed)
Pt comes from home where she took two unknown blue pills that her friend gave her for self treatment of insomnia.  EMS was called by a case worker.

## 2011-04-24 NOTE — ED Notes (Signed)
A director of care Grace Montgomery) from [facility] presents to [facility] with the report of having read many text messages sent from the pt about wanting to end her life.

## 2011-04-24 NOTE — ED Notes (Signed)
ZOX:WRUE<AV> Expected date:04/24/11<BR> Expected time:10:56 PM<BR> Means of arrival:Ambulance<BR> Comments:<BR> EMS [facility], 20 yof took 2 unknown pills, very sleepy

## 2011-04-25 ENCOUNTER — Encounter (HOSPITAL_COMMUNITY): Payer: Self-pay | Admitting: *Deleted

## 2011-04-25 ENCOUNTER — Inpatient Hospital Stay (HOSPITAL_COMMUNITY)
Admission: RE | Admit: 2011-04-25 | Discharge: 2011-04-28 | DRG: 885 | Disposition: A | Discharge status: Home / Self Care | Payer: Medicaid Other | Source: Ambulatory Visit | Attending: Psychiatry | Admitting: Psychiatry

## 2011-04-25 DIAGNOSIS — J45909 Unspecified asthma, uncomplicated: Secondary | ICD-10-CM

## 2011-04-25 DIAGNOSIS — F1220 Cannabis dependence, uncomplicated: Secondary | ICD-10-CM

## 2011-04-25 DIAGNOSIS — F17200 Nicotine dependence, unspecified, uncomplicated: Secondary | ICD-10-CM

## 2011-04-25 DIAGNOSIS — E119 Type 2 diabetes mellitus without complications: Secondary | ICD-10-CM

## 2011-04-25 DIAGNOSIS — Z79899 Other long term (current) drug therapy: Secondary | ICD-10-CM

## 2011-04-25 DIAGNOSIS — F1010 Alcohol abuse, uncomplicated: Secondary | ICD-10-CM

## 2011-04-25 DIAGNOSIS — R45851 Suicidal ideations: Secondary | ICD-10-CM

## 2011-04-25 DIAGNOSIS — Z91018 Allergy to other foods: Secondary | ICD-10-CM

## 2011-04-25 DIAGNOSIS — F329 Major depressive disorder, single episode, unspecified: Secondary | ICD-10-CM | POA: Diagnosis present

## 2011-04-25 DIAGNOSIS — F411 Generalized anxiety disorder: Secondary | ICD-10-CM

## 2011-04-25 DIAGNOSIS — F332 Major depressive disorder, recurrent severe without psychotic features: Principal | ICD-10-CM

## 2011-04-25 LAB — URINALYSIS, ROUTINE W REFLEX MICROSCOPIC
Bilirubin Urine: NEGATIVE
Glucose, UA: NEGATIVE mg/dL
Ketones, ur: NEGATIVE mg/dL
Nitrite: NEGATIVE
Protein, ur: 30 mg/dL — AB
Specific Gravity, Urine: 1.008 (ref 1.005–1.030)
Urobilinogen, UA: 0.2 mg/dL (ref 0.0–1.0)
pH: 6 (ref 5.0–8.0)

## 2011-04-25 LAB — COMPREHENSIVE METABOLIC PANEL WITH GFR
ALT: 7 U/L (ref 0–35)
AST: 13 U/L (ref 0–37)
Albumin: 3.4 g/dL — ABNORMAL LOW (ref 3.5–5.2)
Alkaline Phosphatase: 74 U/L (ref 39–117)
BUN: 13 mg/dL (ref 6–23)
CO2: 23 meq/L (ref 19–32)
Calcium: 9.1 mg/dL (ref 8.4–10.5)
Chloride: 105 meq/L (ref 96–112)
Creatinine, Ser: 0.84 mg/dL (ref 0.50–1.10)
GFR calc Af Amer: >90 mL/min (ref >90)
GFR calc non Af Amer: >90 mL/min (ref >90)
Glucose, Bld: 130 mg/dL — ABNORMAL HIGH (ref 70–99)
Potassium: 4 meq/L (ref 3.5–5.1)
Sodium: 137 meq/L (ref 135–145)
Total Bilirubin: 0.1 mg/dL — ABNORMAL LOW (ref 0.3–1.2)
Total Protein: 7.1 g/dL (ref 6.0–8.3)

## 2011-04-25 LAB — PREGNANCY, URINE: Preg Test, Ur: NEGATIVE

## 2011-04-25 LAB — CBC
HCT: 27.3 % — ABNORMAL LOW (ref 36.0–46.0)
Platelets: 363 10*3/uL (ref 150–400)
RBC: 3.96 MIL/uL (ref 3.87–5.11)
RDW: 18.5 % — ABNORMAL HIGH (ref 11.5–15.5)
WBC: 10.2 10*3/uL (ref 4.0–10.5)

## 2011-04-25 LAB — URINE MICROSCOPIC-ADD ON

## 2011-04-25 LAB — RAPID URINE DRUG SCREEN, HOSP PERFORMED
Amphetamines: NOT DETECTED
Tetrahydrocannabinol: POSITIVE — AB

## 2011-04-25 LAB — GLUCOSE, CAPILLARY: Glucose-Capillary: 82 mg/dL (ref 70–99)

## 2011-04-25 LAB — SALICYLATE LEVEL: Salicylate Lvl: <2 mg/dL — ABNORMAL LOW (ref 2.8–20.0)

## 2011-04-25 MED ORDER — ALUM & MAG HYDROXIDE-SIMETH 200-200-20 MG/5ML PO SUSP
30.0000 mL | ORAL | Status: DC | PRN
  Administered 2011-04-27: 30 mL via ORAL

## 2011-04-25 MED ORDER — NICOTINE 21 MG/24HR TD PT24
21.0000 mg | MEDICATED_PATCH | Freq: Every day | TRANSDERMAL | Status: DC
  Filled 2011-04-25 (×3): qty 1

## 2011-04-25 MED ORDER — LORAZEPAM 1 MG PO TABS: 1.0000 mg | ORAL_TABLET | Freq: Three times a day (TID) | ORAL | Status: DC | PRN

## 2011-04-25 MED ORDER — IBUPROFEN 600 MG PO TABS: 600.0000 mg | ORAL_TABLET | Freq: Three times a day (TID) | ORAL | Status: DC | PRN

## 2011-04-25 MED ORDER — ONDANSETRON HCL 4 MG PO TABS: 4.0000 mg | ORAL_TABLET | Freq: Three times a day (TID) | ORAL | Status: DC | PRN

## 2011-04-25 MED ORDER — MAGNESIUM HYDROXIDE 400 MG/5ML PO SUSP: 30.0000 mL | Freq: Every day | ORAL | Status: DC | PRN

## 2011-04-25 MED ORDER — ALUM & MAG HYDROXIDE-SIMETH 200-200-20 MG/5ML PO SUSP: 30.0000 mL | ORAL | Status: DC | PRN

## 2011-04-25 MED ORDER — NICOTINE 21 MG/24HR TD PT24: 21.0000 mg | MEDICATED_PATCH | Freq: Every day | TRANSDERMAL | Status: DC

## 2011-04-25 MED ORDER — ACETAMINOPHEN 325 MG PO TABS
650.0000 mg | ORAL_TABLET | ORAL | Status: DC | PRN
Start: 2011-04-25 — End: 2011-04-25

## 2011-04-25 MED ORDER — ACETAMINOPHEN 325 MG PO TABS: 650.0000 mg | ORAL_TABLET | Freq: Four times a day (QID) | ORAL | Status: DC | PRN

## 2011-04-25 NOTE — ED Notes (Signed)
PD contacted for transport to [facility]

## 2011-04-25 NOTE — ED Notes (Signed)
Rules Regulations explained to pt and questions answered.

## 2011-04-25 NOTE — BH Assessment (Signed)
Assessment Note   Grace Montgomery is an 21 y.o. female who presented voluntarily at [facility] via EMS, however she is now under IVC. Per RN's note - pt brought to [facility] after her caseworker Onalee Hua from [facility] called EMS b/c pt had been sending him texts referencing desire to kill herself. Pt took two unknown pills at that time. At time of assessment, pt reports no knowledge of having texted caseworker. Pt reports fleeting SI but is unable to contract for safety. She endorses depressed mood including fatigue, sadness, some tearfulness, loss of interest, and irritability. She denies HI and denies AVH. Pt also denies any substance use, however THC is present in her drug test. Pt notes loss of appetite and has recently lost 40 lbs.   Axis I: Major Depressive Disorder, Single Episode Axis II: Deferred Axis III:  Past Medical History  Diagnosis Date  . Diabetes mellitus   . Asthma   . 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 Anxiety   . Cannabis dependence   . Alcohol abuse    Axis IV: other psychosocial or environmental problems Axis V: 41-50 serious symptoms  Past Medical History:  Past Medical History  Diagnosis Date  . Diabetes mellitus   . Asthma   . 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 Anxiety   . Cannabis dependence   . Alcohol abuse     History reviewed. No pertinent past surgical history.  Family History: No family history on file.  Social History:  reports that she has been smoking.  She does not have any smokeless tobacco history on file. She reports that she drinks alcohol. She reports that she does not use illicit drugs.  Additional Social History:  Alcohol / Drug Use Pain Medications: none Prescriptions: none Over the Counter: none History of alcohol / drug use?: No history of alcohol / drug abuse (denies any substance use but THC present in labs 2/17) Longest period of sobriety (when/how long): n/a Allergies:    Allergies  Allergen Reactions  . Chocolate     Ears & throat itch    Home Medications:  Medications Prior to Admission  Medication Dose Route Frequency Provider Last Rate Last Dose  . acetaminophen (TYLENOL) tablet 650 mg  650 mg Oral Q4H PRN Vida Roller, MD      . alum & mag hydroxide-simeth (MAALOX/MYLANTA) 200-200-20 MG/5ML suspension 30 mL  30 mL Oral PRN Vida Roller, MD      . ibuprofen (ADVIL,MOTRIN) tablet 600 mg  600 mg Oral Q8H PRN Vida Roller, MD      . LORazepam (ATIVAN) tablet 1 mg  1 mg Oral Q8H PRN Vida Roller, MD      . nicotine (NICODERM CQ - dosed in mg/24 hours) patch 21 mg  21 mg Transdermal Daily Vida Roller, MD      . ondansetron [facility]) tablet 4 mg  4 mg Oral Q8H PRN Vida Roller, MD       Medications Prior to Admission  Medication Sig Dispense Refill  . albuterol (PROVENTIL,VENTOLIN) 90 MCG/ACT inhaler Inhale 2 puffs into the lungs every 6 (six) hours as needed. wheezing       . insulin aspart (NOVOLOG) 100 UNIT/ML injection Inject 36-42 Units into the skin 3 (three) times daily before meals. 42 units in the morning,24 units in the afternoon,36 units before supper       . mometasone (  NASONEX) 50 MCG/ACT nasal spray Place 2 sprays into the nose daily.  17 g  12    OB/GYN Status:  No LMP recorded.  General Assessment Data Location of Assessment: [facility] ED Living Arrangements: Children Can pt return to current living arrangement?: Yes Admission Status: Involuntary Is patient capable of signing voluntary admission?: Yes Transfer from: Acute Hospital  Education Status Is patient currently in school?: No  Risk to self Suicidal Ideation: Yes-Currently Present Suicidal Intent: No Is patient at risk for suicide?: No Suicidal Plan?: No Access to Means: No What has been your use of drugs/alcohol within the last 12 months?: denies substance use but thc in blood test Previous Attempts/Gestures: No How many times?: 0  Other Self Harm Risks:  n/a Triggers for Past Attempts:  (n/a) Intentional Self Injurious Behavior: None Family Suicide History: No Recent stressful life event(s):  (didn't name anything specific) Persecutory voices/beliefs?: No Depression: No Depression Symptoms: Despondent;Tearfulness;Feeling angry/irritable;Fatigue;Loss of interest in usual pleasures Substance abuse history and/or treatment for substance abuse?: No  Risk to Others Homicidal Ideation: No Thoughts of Harm to Others: No Current Homicidal Intent: No Current Homicidal Plan: No Access to Homicidal Means: No Identified Victim: n/a History of harm to others?: No Assessment of Violence: None Noted Violent Behavior Description: n/a Does patient have access to weapons?: No Criminal Charges Pending?: No Does patient have a court date: No  Psychosis Hallucinations: None noted Delusions: None noted  Mental Status Report Appear/Hygiene: Disheveled Eye Contact: Poor Motor Activity: Freedom of movement Speech: Logical/coherent Level of Consciousness: Drowsy Mood: Depressed Affect: Appropriate to circumstance Anxiety Level: None Thought Processes: Coherent;Relevant Judgement: Impaired Orientation: Situation;Time;Place;Person Obsessive Compulsive Thoughts/Behaviors: None  Cognitive Functioning Concentration: Decreased Memory: Remote Intact;Recent Intact IQ: Average Insight: Poor Impulse Control: Poor Appetite: Poor Weight Loss: 40  Sleep: No Change Total Hours of Sleep: 5  Vegetative Symptoms: None  Prior Inpatient Therapy Prior Inpatient Therapy: No Prior Therapy Dates: n/a Prior Therapy Facilty/Provider(s): n/a Reason for Treatment: n?a  Prior Outpatient Therapy Prior Outpatient Therapy: Yes Prior Therapy Dates: currently Prior Therapy Facilty[facility]Provider(s): [facility] Reason for Treatment: depression/community support team  ADL Screening (condition at time of admission) Patient's cognitive ability adequate to safely  complete daily activities?: Yes Patient able to express need for assistance with ADLs?: Yes Independently performs ADLs?: Yes Weakness of Legs: None Weakness of Arms/Hands: None       Abuse/Neglect Assessment (Assessment to be complete while patient is alone) Physical Abuse: Denies Verbal Abuse: Yes, past (Comment) Sexual Abuse: Denies Exploitation of patient/patient's resources: Denies Self-Neglect: Denies Values / Beliefs Cultural Requests During Hospitalization: None Spiritual Requests During Hospitalization: None        Additional Information 1:1 In Past 12 Months?: No CIRT Risk: No Elopement Risk: No Does patient have medical clearance?: Yes     Disposition:  Disposition Disposition of Patient: Outpatient treatment;Inpatient treatment program Type of inpatient treatment program: Adult Type of outpatient treatment: Adult  On Site Evaluation by:   Reviewed with Physician:     Donnamarie Rossetti P 04/25/2011 6:03 AM

## 2011-04-25 NOTE — ED Notes (Signed)
Sitting quietly in room waiting for [employer] to arrive

## 2011-04-25 NOTE — ED Notes (Signed)
Pt reports she does not take insulin now--was dx w/ with gestational diabetes when she was pregnant 1 yr ago

## 2011-04-25 NOTE — ED Notes (Signed)
[employer] here to see

## 2011-04-25 NOTE — ED Notes (Signed)
Grace Montgomery -case worker into see

## 2011-04-25 NOTE — ED Notes (Signed)
Up to the bathroom 

## 2011-04-25 NOTE — ED Provider Notes (Signed)
Pt states she is currently on her period which typically last 7 days with heavy flow requiring multiple pads daily. She has an appointment to see her OB/GYN on the 20th of this month to address this concern. She is anemic currently but denies CP, SOB, dizziness or weakness. Her VS are normal. Pt does not require acute transfusion for her anemia and should keep appointment with OB/GYN. Will advise starting iron supplements.   Loren Racer, MD 04/25/11 [phone]

## 2011-04-25 NOTE — ED Notes (Signed)
Family in to see. 

## 2011-04-25 NOTE — ED Notes (Signed)
1 bag of belongings sent w/ pt 

## 2011-04-25 NOTE — ED Notes (Signed)
Up to the bathroom to bath and shower

## 2011-04-25 NOTE — ED Notes (Signed)
Permission given by pt to discuss care w/ Onalee Hua at bedside--Pt also reports that she started on a new medication on Sunday-but is unsure of the name.  Onalee Hua will contact pt's Dr to try to find out the name of current medication and previous medication that she tried.

## 2011-04-25 NOTE — BH Assessment (Signed)
Pt too drowsy to assess at this time. Writer will try to assess later this am.

## 2011-04-25 NOTE — Progress Notes (Signed)
Patient ID: Grace Montgomery, female   DOB: 03/13/1990, 21 y.o.   MRN: [id number]  Pt was admitted to [facility] after a overdose on unknown pills. Pt reported on admission that she did not know why she was admitted and that she thought she was going home. Pt was agitated on admission, and was a very poor historian. Pt reported being negative SI/HI, no AH/VH noted.

## 2011-04-25 NOTE — Tx Team (Signed)
Initial Interdisciplinary Treatment Plan  PATIENT STRENGTHS: (choose at least two) Average or above average intelligence  PATIENT STRESSORS: Marital or family conflict   PROBLEM LIST: Problem List/Patient Goals Date to be addressed Date deferred Reason deferred Estimated date of resolution  Depression  04/25/11           SI 04/25/11                                          DISCHARGE CRITERIA:  Improved stabilization in mood, thinking, and/or behavior  PRELIMINARY DISCHARGE PLAN: Return to previous living arrangement  PATIENT/FAMIILY INVOLVEMENT: This treatment plan has been presented to and reviewed with the patient, Kitara D Behrman.  The patient and family have been given the opportunity to ask questions and make suggestions.  Jacquelyne Balint Shanta 04/25/2011, 6:39 PM

## 2011-04-26 DIAGNOSIS — F329 Major depressive disorder, single episode, unspecified: Secondary | ICD-10-CM | POA: Diagnosis present

## 2011-04-26 MED ORDER — FERROUS SULFATE 325 (65 FE) MG PO TABS
325.0000 mg | ORAL_TABLET | Freq: Two times a day (BID) | ORAL | Status: DC
  Administered 2011-04-26 – 2011-04-28 (×5): 325 mg via ORAL
  Filled 2011-04-26 (×9): qty 1

## 2011-04-26 MED ORDER — CITALOPRAM HYDROBROMIDE 10 MG PO TABS
10.0000 mg | ORAL_TABLET | Freq: Every day | ORAL | Status: DC
  Administered 2011-04-26 – 2011-04-28 (×3): 10 mg via ORAL
  Filled 2011-04-26 (×4): qty 1

## 2011-04-26 MED ORDER — DIPHENHYDRAMINE HCL 25 MG PO CAPS
25.0000 mg | ORAL_CAPSULE | Freq: Once | ORAL | Status: AC
  Administered 2011-04-26: 25 mg via ORAL

## 2011-04-26 NOTE — Progress Notes (Addendum)
Patient ID: Grace Montgomery, female   DOB: 04/02/1990, 21 y.o.   MRN: [id number] Pt. had been in bed all evening, since after admission.  Pt. awoke c/o hunger and was given a snack. Interaction with staff was appropriate and mood was quiet and subdued.  No c/o of any other problem.  03:30AM--Pt. Awoke and was in the bathroom, when she requested assistance to obtain towels and wash cloths in order to bathe, due to incontinent heavy menstrual flow: "it's running down my leg."  Pt. was allowed to take a shower and cleansed herself and wore a a fresh, clean Pt. Gown. 04:30--Pt asleep.

## 2011-04-26 NOTE — Progress Notes (Signed)
[facility] Group Notes:  (Counselor/Nursing/MHT/Case Management/Adjunct) 1:15pm   Type of Therapy:  Group Therapy  Participation Level:  Did Not Attend       Grace Montgomery 04/26/2011  2:07 PM   

## 2011-04-26 NOTE — H&P (Signed)
Medical/psychiatric screening examination/treatment/procedure(s) were performed by non-physician practitioner and as supervising physician I was immediately available for consultation/collaboration.   I have seen and examined this patient and agree with this evaluation.  

## 2011-04-26 NOTE — Progress Notes (Addendum)
Pt. Is very quiet this am but is attending group./ states she slept well this am and does contract for safety. No SI or HI at this time. Will continue to monitor closely. Pt. Is currently invol. Denies wanting to hurt herself right now, Pt was on the phone earlier smiling

## 2011-04-26 NOTE — Progress Notes (Signed)
[facility] Group Notes: (Counselor/Nursing/MHT/Case Management/Adjunct)  04/26/2011 @ 11:00am  Type of Therapy: Group Therapy   Participation Level: None   Participation Quality: None   Affect: Blunted   Cognitive: Appropriate   Insight: None   Engagement in Group: None   Engagement in Therapy: None   Modes of Intervention: Support and Exploration   Summary of Progress/Problems: Honesty was attentive but not engaged in group process   Grace Montgomery  04/26/2011 12:43 PM

## 2011-04-26 NOTE — Progress Notes (Signed)
SW met with pt individually at this time.  Pt was open with sharing reason for entering the hospital.  Pt states that she overdosed on sleeping pills and was sending texts to people, stating she didn't want to be here anymore.  When asked how pt felt about this today pt states that she feels she is meant to be here since she did not succeed.  Pt states that she lives by herself in [place] and has transportation home.  Pt states that she is seen by [facility] for medication management and therapy and states that they are coming to visit her tonight.  SW contacted [facility] and confirmed pt is seen by them for CST, PSR and medication management.  They will need to be contacted closer to d/c to schedule a follow up appointment.  No further needs at this time.   Reyes Ivan, [place] 04/26/2011  3:44 PM

## 2011-04-26 NOTE — [facility] Counselor (Signed)
Adult Comprehensive Assessment  Patient ID: Grace Montgomery, female   DOB: 05/12/1990, 21 y.o.   MRN: [id number]  Information Source: Information source: Patient  Current Stressors:  Educational / Learning stressors: has done some work toward a [employer] but did not finish Employment / Job issues: unemployed Family Relationships: Clinical cytogeneticist / Lack of resources (include bankruptcy): no income Housing / Lack of housing: no stressors Physical health (include injuries & life threatening diseases): diabetes, recent miscarriage Social relationships: no stressors Substance abuse: THC Bereavement / Loss: recent miscarriage  Living/Environment/Situation:  Living Arrangements: Spouse/significant other Living conditions (as described by patient or guardian): lives alone with children How long has patient lived in current situation?: about a year What is atmosphere in current home: Comfortable  Family History:  Marital status: Single Does patient have children?: Yes How many children?: 2  How is patient's relationship with their children?: 1 son and 1 daughter - great relationships  Childhood History:  By whom was/is the patient raised?: Mother Additional childhood history information: "everyone in my family has problems" Description of patient's relationship with caregiver when they were a child: okay Patient's description of current relationship with people who raised him/her: okay  Does patient have siblings?: Yes Number of Siblings: 4  Description of patient's current relationship with siblings: okay with some of them Did patient suffer any verbal/emotional/physical/sexual abuse as a child?: No Did patient suffer from severe childhood neglect?: No Has patient ever been sexually abused/assaulted/raped as an adolescent or adult?: No Was the patient ever a victim of a crime or a disaster?: No Witnessed domestic violence?: No Has patient been effected by domestic violence as an  adult?: No  Education:  Highest grade of school patient has completed: 10th grade Currently a student?: No Learning disability?: No  Employment/Work Situation:   Employment situation: Unemployed Patient's job has been impacted by current illness: No What is the longest time patient has a held a job?: never had a job Where was the patient employed at that time?: never worked Has patient ever been in the [employer]?: No Has patient ever served in Buyer, retail?: No  Financial Resources:   Financial resources: No income Does patient have a Lawyer or guardian?: No  Alcohol/Substance Abuse:   If attempted suicide, did drugs/alcohol play a role in this?: No (denies any attempt - took 2 sleeping pills) Alcohol/Substance Abuse Treatment Hx: Denies past history Has alcohol/substance abuse ever caused legal problems?: No  Social Support System:   Patient's Community Support System: Good Describe Community Support System: [employer], PSR people, pastor Type of faith/religion: Ephri[employer] Knuckles How does patient's faith help to cope with current illness?: reads the Bible, guidance from pastor  Leisure/Recreation:   Leisure and Hobbies: cooking   Strengths/Needs:   What things does the patient do well?: people person In what areas does patient struggle / problems for patient: "I'm fine, I don't need to be here. I have a house to get back to and children to get back to."; sent texts to case manager saying she didn't want to live and took 2 sleeping pills, denies stressors other than wanting to get some sleep.  Discharge Plan:   Does patient have access to transportation?: Yes Will patient be returning to same living situation after discharge?: Yes Currently receiving community mental health services: Yes (From Whom) Herbalist Team at [facility]) If no, would patient like referral for services when discharged?: No Does patient have financial barriers related to discharge  medications?: No  Summary/Recommendations:   Summary and Recommendations (to be completed by the evaluator): Grace Montgomery is a 21 year old single female diagnosed with Major Depressive Disorder. She reports she was not suicidal and did not attempt. States life is "stressful" but nothing out of the ordinary and she does not believe she needs to be in the hospital. Grace Montgomery would benefit from crisis stabilization, medication evaluation, therapy groups for processing thoughts/feelings/experiences, psychoed groups for coping skills, and case management for discharge planning.   Grace Montgomery, Grace Montgomery. 04/26/2011

## 2011-04-26 NOTE — [facility] Suicide Risk Assessment (Signed)
Suicide Risk Assessment  Admission Assessment     Demographic factors:  Assessment Details Time of Assessment: Admission Information Obtained From: Patient Current Mental Status:    Loss Factors:  Loss Factors: Financial problems / change in socioeconomic status Historical Factors:  Historical Factors: Prior suicide attempts Risk Reduction Factors:  Risk Reduction Factors: Responsible for children under 22 years of age  CLINICAL FACTORS:   Depression:   Anhedonia Comorbid alcohol abuse/dependence Hopelessness  COGNITIVE FEATURES THAT CONTRIBUTE TO RISK:  Thought constriction (tunnel vision)    SUICIDE RISK:   Moderate:  Frequent suicidal ideation with limited intensity, and duration, some specificity in terms of plans, no associated intent, good self-control, limited dysphoria/symptomatology, some risk factors present, and identifiable protective factors, including available and accessible social support.  Reason for hospitalization: .Pt was texting a friend and making referencies about not understanding why she is here  Diagnosis:  Axis I: Major Depression, single episode  ADL's:  Intact  Sleep: Good  Appetite:  Poor  Suicidal Ideation:  Plan:  Denies Intent:  Denies Means:  Denies Homicidal Ideation:  Plan:  No Intent:  No Means:  No  AEB (as evidenced by):  Mental Status Examination/Evaluation: Objective:  Appearance: Casual  Eye Contact::  Fair  Speech:  Clear and Coherent  Volume:  Decreased  Mood:  5 /10 on a scale of 1 is the best and 10 is the worst  Anxious: 1/10 on the same scale  Affect:  Blunt  Thought Process:  Coherent  Orientation:  Full  Thought Content:  WDL  Suicidal Thoughts:  No  Homicidal Thoughts:  No  Memory:  Immediate;   Good  Judgement:  Impaired  Insight:  Lacking  Psychomotor Activity:  Normal  Concentration:  Fair  Recall:  Fair  Akathisia:  No  Handed:  Right  AIMS (if indicated):     Assets:  Resilience  Sleep:        Vital Signs: Blood pressure 120/63, pulse 71, temperature 98.4 F (36.9 C), temperature source Oral, resp. rate 16, height 5\' 2"  (1.575 m), weight 92.534 kg (204 lb), last menstrual period 04/25/2011. Current Medications:  Current Facility-Administered Medications  Medication Dose Route Frequency Provider Last Rate Last Dose  . acetaminophen (TYLENOL) tablet 650 mg  650 mg Oral Q6H PRN Jorje Guild, PA      . alum & mag hydroxide-simeth (MAALOX/MYLANTA) 200-200-20 MG/5ML suspension 30 mL  30 mL Oral Q4H PRN Jorje Guild, PA      . ferrous sulfate tablet 325 mg  325 mg Oral BID WC Sanjuana Kava, NP      . magnesium hydroxide (MILK OF MAGNESIA) suspension 30 mL  30 mL Oral Daily PRN Jorje Guild, PA      . nicotine (NICODERM CQ - dosed in mg/24 hours) patch 21 mg  21 mg Transdermal Q0600 Jorje Guild, PA       Facility-Administered Medications Ordered in Other Encounters  Medication Dose Route Frequency Provider Last Rate Last Dose  . DISCONTD: acetaminophen (TYLENOL) tablet 650 mg  650 mg Oral Q4H PRN Vida Roller, MD      . DISCONTD: alum & mag hydroxide-simeth (MAALOX/MYLANTA) 200-200-20 MG/5ML suspension 30 mL  30 mL Oral PRN Vida Roller, MD      . DISCONTD: ibuprofen (ADVIL,MOTRIN) tablet 600 mg  600 mg Oral Q8H PRN Vida Roller, MD      . DISCONTD: LORazepam (ATIVAN) tablet 1 mg  1 mg Oral Q8H PRN Oris Drone  Hyacinth Meeker, MD      . DISCONTD: nicotine (NICODERM CQ - dosed in mg/24 hours) patch 21 mg  21 mg Transdermal Daily Vida Roller, MD      . DISCONTD: ondansetron [facility]) tablet 4 mg  4 mg Oral Q8H PRN Vida Roller, MD        Lab Results:  Results for orders placed during the hospital encounter of 04/24/11 (from the past 48 hour(s))  CBC     Status: Abnormal   Collection Time   04/24/11 11:22 PM      Component Value Range Comment   WBC 10.2  4.0 - 10.5 (K/uL)    RBC 3.96  3.87 - 5.11 (MIL/uL)    Hemoglobin 8.3 (*) 12.0 - 15.0 (g/dL)    HCT 16.1 (*) 09.6 - 46.0 (%)    MCV 68.9 (*)  78.0 - 100.0 (fL)    MCH 21.0 (*) 26.0 - 34.0 (pg)    MCHC 30.4  30.0 - 36.0 (g/dL)    RDW 04.5 (*) 40.9 - 15.5 (%)    Platelets 363  150 - 400 (K/uL)   COMPREHENSIVE METABOLIC PANEL     Status: Abnormal   Collection Time   04/24/11 11:22 PM      Component Value Range Comment   Sodium 137  135 - 145 (mEq/L)    Potassium 4.0  3.5 - 5.1 (mEq/L)    Chloride 105  96 - 112 (mEq/L)    CO2 23  19 - 32 (mEq/L)    Glucose, Bld 130 (*) 70 - 99 (mg/dL)    BUN 13  6 - 23 (mg/dL)    Creatinine, Ser 8.11  0.50 - 1.10 (mg/dL)    Calcium 9.1  8.4 - 10.5 (mg/dL)    Total Protein 7.1  6.0 - 8.3 (g/dL)    Albumin 3.4 (*) 3.5 - 5.2 (g/dL)    AST 13  0 - 37 (U/L)    ALT 7  0 - 35 (U/L)    Alkaline Phosphatase 74  39 - 117 (U/L)    Total Bilirubin 0.1 (*) 0.3 - 1.2 (mg/dL)    GFR calc non Af Amer >90  >90 (mL/min)    GFR calc Af Amer >90  >90 (mL/min)   ETHANOL     Status: Normal   Collection Time   04/24/11 11:22 PM      Component Value Range Comment   Alcohol, Ethyl (B) <11  0 - 11 (mg/dL)   ACETAMINOPHEN LEVEL     Status: Normal   Collection Time   04/24/11 11:22 PM      Component Value Range Comment   Acetaminophen (Tylenol), Serum <15.0  10 - 30 (ug/mL)   SALICYLATE LEVEL     Status: Abnormal   Collection Time   04/24/11 11:22 PM      Component Value Range Comment   Salicylate Lvl <2.0 (*) 2.8 - 20.0 (mg/dL)   URINE RAPID DRUG SCREEN (HOSP PERFORMED)     Status: Abnormal   Collection Time   04/25/11 12:20 AM      Component Value Range Comment   Opiates NONE DETECTED  NONE DETECTED     Cocaine NONE DETECTED  NONE DETECTED     Benzodiazepines NONE DETECTED  NONE DETECTED     Amphetamines NONE DETECTED  NONE DETECTED     Tetrahydrocannabinol POSITIVE (*) NONE DETECTED     Barbiturates NONE DETECTED  NONE DETECTED    URINALYSIS, ROUTINE W REFLEX MICROSCOPIC  Status: Abnormal   Collection Time   04/25/11 12:20 AM      Component Value Range Comment   Color, Urine RED (*) YELLOW   BIOCHEMICALS MAY BE AFFECTED BY COLOR   APPearance CLOUDY (*) CLEAR     Specific Gravity, Urine 1.008  1.005 - 1.030     pH 6.0  5.0 - 8.0     Glucose, UA NEGATIVE  NEGATIVE (mg/dL)    Hgb urine dipstick LARGE (*) NEGATIVE     Bilirubin Urine NEGATIVE  NEGATIVE     Ketones, ur NEGATIVE  NEGATIVE (mg/dL)    Protein, ur 30 (*) NEGATIVE (mg/dL)    Urobilinogen, UA 0.2  0.0 - 1.0 (mg/dL)    Nitrite NEGATIVE  NEGATIVE     Leukocytes, UA TRACE (*) NEGATIVE    PREGNANCY, URINE     Status: Normal   Collection Time   04/25/11 12:20 AM      Component Value Range Comment   Preg Test, Ur NEGATIVE  NEGATIVE    URINE MICROSCOPIC-ADD ON     Status: Normal   Collection Time   04/25/11 12:20 AM      Component Value Range Comment   RBC / HPF TOO NUMEROUS TO COUNT  <3 (RBC/hpf)    Urine-Other FIELD OBSCURED BY RBC'S     GLUCOSE, CAPILLARY     Status: Abnormal   Collection Time   04/25/11  8:43 AM      Component Value Range Comment   Glucose-Capillary 104 (*) 70 - 99 (mg/dL)    Comment 1 Notify RN     GLUCOSE, CAPILLARY     Status: Normal   Collection Time   04/25/11 12:18 PM      Component Value Range Comment   Glucose-Capillary 82  70 - 99 (mg/dL)    Comment 1 Notify RN     GLUCOSE, CAPILLARY     Status: Abnormal   Collection Time   04/25/11  5:25 PM      Component Value Range Comment   Glucose-Capillary 111 (*) 70 - 99 (mg/dL)    Physical Findings: AIMS:   CIWA:     COWS:      Treatment Plan Summary: Daily contact with patient to assess and evaluate symptoms and progress in treatment Medication management  Risk of harm to self is elevated by her depression and her absolute lack of insight.  Risk of harm to others is minimal in that she has not been involved in fights or had any legal charges filed on her.  Plan: We will admit the patient for crisis stabilization and treatment. I talked to pt about starting Celexa for depression.  That is what her primary care provider had started for  her, but she refused to take it because she "didn't believe in taking that crap". I explained the risks and benefits of medication in detail.  We will continue on q. 15 checks the unit protocol. At this time there is no clinical indication for one-to-one observation as patient contract for safety and presents little risk to harm themself and others.  We will increase collateral information. I encourage patient to participate in group milieu therapy. Pt will be seen in treatment team meeting tomorrow morning for further treatment and appropriate discharge planning. Please see history and physical note for more detailed information ELOS: 4 to 6 days.     Tamesha Ellerbrock 04/26/2011, 9:51 AM

## 2011-04-26 NOTE — H&P (Signed)
Psychiatric Admission Assessment Adult  Patient Identification:  Grace Montgomery Date of Evaluation:  04/26/2011 Chief Complaint:  MDD  History of Present Illness:: This is a 21 year old African-American female, admitted to [facility] from the [facility] ED with complaints of suicidal threats. Patient at this time is under involuntary commitment. Patient reports, "I really do not know why I am in this hospital. I was told by some people that I tried to kill myself. I know I did not try and or attempt to hurt myself. I remembered taking 2 sleeping pills to help me get some sleep, that is all. This innocent thing was turn into something that did not make any sense. I am not depressed, rather being in here depresses me. I am not suicidal and I did not try to hurt myself. I have 2 children to raise. This is my first time being in a place like this.  So when am I getting out of here? I have not seen any nurse since I have been in here"  Mood Symptoms:  Insomnia Depression Symptoms:  insomnia, (Hypo) Manic Symptoms:  Irritable Mood, Anxiety Symptoms:  Excessive Worry, Psychotic Symptoms:  Hallucinations: None  PTSD Symptoms: Had a traumatic exposure:  None reported  Past Psychiatric History: Diagnosis: Major depressive disorder  Hospitalizations: [facility]  Outpatient Care: "[facility] to [facility] here in [place]"  Substance Abuse Care: None reported  Self-Mutilation: None reported  Suicidal Attempts: Report indicates by overdose, however, patient denies r                                               reports.  Violent Behaviors: None reported   Past Medical History:   Past Medical History  Diagnosis Date  . Diabetes mellitus   . Asthma   . 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 Anxiety   . Cannabis dependence   . Alcohol abuse    None. Allergies:   Allergies  Allergen Reactions  . Chocolate     Ears & throat itch   PTA Medications: Prescriptions prior  to admission  Medication Sig Dispense Refill  . albuterol (PROVENTIL,VENTOLIN) 90 MCG/ACT inhaler Inhale 2 puffs into the lungs every 6 (six) hours as needed. wheezing      . insulin aspart (NOVOLOG) 100 UNIT/ML injection Inject 36-42 Units into the skin 3 (three) times daily before meals. 42 units in the morning,24 units in the afternoon,36 units before supper       . mometasone (NASONEX) 50 MCG/ACT nasal spray Place 2 sprays into the nose daily.  17 g  12  . Naproxen Sodium (ALEVE) 220 MG CAPS Take 2 capsules by mouth daily as needed. For pain        Previous Psychotropic Medications:  Medication/Dose                 Substance Abuse History in the last 12 months: Substance Age of 1st Use Last Use Amount Specific Type  Nicotine 1 4 Prior to hosp 4-6 cigarettes daily Cigarettes  Alcohol Denies use     Cannabis Denies use, however, report indicated + result on admission.      Opiates Denies use     Cocaine Denies use     Methamphetamines Denies use     LSD Denies use  Ecstasy Denies use     Benzodiazepines Denies use     Caffeine      Inhalants      Others:                         Consequences of Substance Abuse: Medical Consequences:  Liver damage, possible death by overdose. Legal Consequences:  Arrests, jail times, loss of driving privileges. Family Consequences:  Family discord Blackouts:   DT's: Withdrawal Symptoms:   Cramps Diaphoresis Headaches Nausea Tremors Vomiting  Social History: Current Place of Residence: [place] Place of Birth:  [place] Family Members: "My 2 children" Marital Status:  Single Children:2  Sons:1  Daughters:1 Relationships: "My children" Education:  No high school diploma Educational Problems/Performance: "I did not finish high school" Religious Beliefs/Practices: None reported History of Abuse (Emotional/Phsycial/Sexual): None reported Occupational Experiences: Unemployed Military History:  None. Legal History:  None reported Hobbies/Interests: None reported  Family History:  History reviewed. No pertinent family history.  Mental Status Examination/Evaluation: Objective:  Appearance: Disheveled  Eye Contact::  Fair  Speech:  Clear and Coherent  Volume:  Normal  Mood:  Irritable and frustrated  Affect:  Flat  Thought Process:  Coherent  Orientation:  Full  Thought Content:  Rumination  Suicidal Thoughts:  No, "I am not suicidal"  Homicidal Thoughts:  No  Memory:  Immediate;   Good Recent;   Good Remote;   Good  Judgement:  Impaired  Insight:  Lacking  Psychomotor Activity:  Normal  Concentration:  Fair  Recall:  Good  Akathisia:  No  Handed:  Right  AIMS (if indicated):     Assets:  Others:  lacked awareness of the issue at hand  Sleep:    Poor           Assessment:    AXIS I:  Major depressive disorder, AXIS II:  Deferred AXIS III:   Past Medical History  Diagnosis Date  . Diabetes mellitus   . Asthma   . 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 Anxiety   . Cannabis dependence   . Alcohol abuse    AXIS IV:  economic problems, educational problems, occupational problems, other psychosocial or environmental problems and problems related to social environment AXIS V:  41-50 serious symptoms  Treatment Plan/Recommendations: Admit for safety and stabilization.                                                                Review and reinstate any pertinent home medications.                                                                Start Ferrous sulfate 325 mg twice daily.  Obtain vitamin D levels.                                                         Treatment Plan Summary: Daily contact with patient to assess and evaluate symptoms and progress in treatment Medication management Current Medications:  Current  Facility-Administered Medications  Medication Dose Route Frequency Provider Last Rate Last Dose  . acetaminophen (TYLENOL) tablet 650 mg  650 mg Oral Q6H PRN Jorje Guild, PA      . alum & mag hydroxide-simeth (MAALOX/MYLANTA) 200-200-20 MG/5ML suspension 30 mL  30 mL Oral Q4H PRN Jorje Guild, PA      . citalopram (CELEXA) tablet 10 mg  10 mg Oral Daily Orson Aloe, MD      . ferrous sulfate tablet 325 mg  325 mg Oral BID WC Sanjuana Kava, NP      . magnesium hydroxide (MILK OF MAGNESIA) suspension 30 mL  30 mL Oral Daily PRN Jorje Guild, PA      . nicotine (NICODERM CQ - dosed in mg/24 hours) patch 21 mg  21 mg Transdermal Q0600 Jorje Guild, PA       Facility-Administered Medications Ordered in Other Encounters  Medication Dose Route Frequency Provider Last Rate Last Dose  . DISCONTD: acetaminophen (TYLENOL) tablet 650 mg  650 mg Oral Q4H PRN Vida Roller, MD      . DISCONTD: alum & mag hydroxide-simeth (MAALOX/MYLANTA) 200-200-20 MG/5ML suspension 30 mL  30 mL Oral PRN Vida Roller, MD      . DISCONTD: ibuprofen (ADVIL,MOTRIN) tablet 600 mg  600 mg Oral Q8H PRN Vida Roller, MD      . DISCONTD: LORazepam (ATIVAN) tablet 1 mg  1 mg Oral Q8H PRN Vida Roller, MD      . DISCONTD: nicotine (NICODERM CQ - dosed in mg/24 hours) patch 21 mg  21 mg Transdermal Daily Vida Roller, MD      . DISCONTD: ondansetron [facility]) tablet 4 mg  4 mg Oral Q8H PRN Vida Roller, MD        Observation Level/Precautions:  Q 15 minutes checks for safety  Laboratory:  Obtain vitamin D lvels.  Psychotherapy:  Group  Medications:  See lists  Routine PRN Medications:  Yes  Consultations:  None indicated  Discharge Concerns:  Safety  Other:     Sanjuana Kava 2/18/201311:40 AM

## 2011-04-27 DIAGNOSIS — F339 Major depressive disorder, recurrent, unspecified: Secondary | ICD-10-CM

## 2011-04-27 LAB — VITAMIN D 25 HYDROXY (VIT D DEFICIENCY, FRACTURES): Vit D, 25-Hydroxy: 12 ng/mL — ABNORMAL LOW (ref 30–89)

## 2011-04-27 LAB — GLUCOSE, CAPILLARY
Glucose-Capillary: 115 mg/dL — ABNORMAL HIGH (ref 70–99)
Glucose-Capillary: 218 mg/dL — ABNORMAL HIGH (ref 70–99)

## 2011-04-27 MED ORDER — PANTOPRAZOLE SODIUM 20 MG PO TBEC
20.0000 mg | DELAYED_RELEASE_TABLET | Freq: Two times a day (BID) | ORAL | Status: DC
  Administered 2011-04-27 – 2011-04-28 (×2): 20 mg via ORAL
  Filled 2011-04-27 (×6): qty 1

## 2011-04-27 MED ORDER — DIPHENHYDRAMINE HCL 25 MG PO CAPS
50.0000 mg | ORAL_CAPSULE | Freq: Every evening | ORAL | Status: DC | PRN
  Administered 2011-04-27: 50 mg via ORAL

## 2011-04-27 NOTE — Progress Notes (Signed)
  [facility] Group Notes:  (Counselor/Nursing/MHT/Case Management/Adjunct)  04/27/2011 5:20 PM  Type of Therapy: Group Therapy   Participation Level: Minimal   Participation Quality: Attentive and Sharing   Affect: Depressed   Cognitive: Appropriate   Insight: Limited   Engagement in Group: Limited   Engagement in Therapy: Limited   Modes of Intervention: Clarification, Education, Problem-solving, Socialization and Support   Summary of iProgress/Problems: Pt participated in group by listening attentively and self disclosing. Therapist prompted Pt to identify areas that are barriers to recovery.  Pt identified alcohol as an area that causes problems.  Pt stated she was going to stick with her brother and get back to a productive life.  Pt actively participated in the positive affirmations exercise and acknowledged it felt good to receive positive reinforcement.  Progress noted. Intervention effective.   Christen Butter 04/27/2011, 5:22 PM

## 2011-04-27 NOTE — Progress Notes (Signed)
Patient ID: Grace Montgomery, female   DOB: [birth year], 21 y.o.   MRN: [id number]  "Everything you do they say you're trying to kill yourself. It was only sleeping pills". Pt denies SI, but said upset with Dr due to comment made to her. Support and encouragement was offered.

## 2011-04-27 NOTE — Progress Notes (Signed)
Grief and Loss Group  Facilitated grief and loss group on [address]. Discussed group rules and confidentiality/privacy. Discussed various ways in which one may experience loss and recognizing the grief reactions one may have in response. This group mainly focused on loss of identity related to mental health dx and loss of freedom and self-esteem as a result of interaction w/ mental health system. Group identified grief reactions (re: anger, frustration, sadness, desperation, feeling overwhelmed) as a result of these losses.  The group was interactive with open sharing and universality.  Pt was engaged in the group, shared her struggle w/ losing pet and w/ loss of identity being labeled w/ mental health dx. Pt related to others in the group and provided insight and support. Pt identified those in her life that were helpful to her and indicated that surrounding herself w/ positive, helpful ppl (not just 'yes' ppl) may aid in fostering her wellness.  Zaiden Ludlum B MS, LPCA, NCC

## 2011-04-27 NOTE — Progress Notes (Signed)
Pt states she slept well, appetite is good, energy level is normal and focus is good. Pt denies SI/HI. Pt rates her depression as a 3 and hopelessness as a 0. Pt c/o feeling "gasy". Pt's goal after discharge is to "take my meds".

## 2011-04-27 NOTE — Progress Notes (Signed)
Pt attended discharge planning group and actively participated.  Pt presents with calm mood and affect.  Pt discussed wanting to d/c to be with her kids. Pt talked about her 25 and 22 year old children.  Pt states that her grandfather is watching them right now.  Pt states that [facility] was unable to visit her yesterday but plan to do so today.  Pt denies depression and anxiety today.  Pt denies SI.  SW discussed how pt plans to stay safe upon d/c.  Pt states that she stopped going to her PSR classes for a week and believes this is why she began feeling worse.  Pt will follow up at [facility] for CST, PSR and med management.  No further needs at this time.    Reyes Ivan, LCSWA 04/27/2011  10:13 AM

## 2011-04-27 NOTE — Tx Team (Signed)
Interdisciplinary Treatment Plan Update (Adult)  Date:  04/27/2011  Time Reviewed:  10:23 AM   Progress in Treatment: Attending groups: Yes Participating in groups:  Yes Taking medication as prescribed: Yes Tolerating medication:  Yes Family/Significant othe contact made:   Patient understands diagnosis:  Yes Discussing patient identified problems/goals with staff:  Yes Medical problems stabilized or resolved:  Yes Denies suicidal/homicidal ideation: Yes Issues/concerns per patient self-inventory:  None identified Other: N/A  New problem(s) identified: None Identified  Reason for Continuation of Hospitalization: Anxiety Depression Medication stabilization  Interventions implemented related to continuation of hospitalization: mood stabilization, medication monitoring and adjustment, group therapy and psycho education, safety checks q 15 mins  Additional comments: N/A  Estimated length of stay: 1-2 days  Discharge Plan: Pt will follow up with [facility] for CST, PSR and med mangement   New goal(s): N/A  Review of initial/current patient goals per problem list:    1.  Goal(s): Reduce depressive symptoms  Met:  No  Target date: by discharge  As evidenced by: Reducing depression from a 10 to a 3 as reported by pt.   2.  Goal (s): Reduce/Eliminate suicidal ideation  Met:  No  Target date: by discharge  As evidenced by: pt reporting no SI.    3.  Goal(s): Reduce anxiety symptoms  Met:  No  Target date: by discharge  As evidenced by: Reduce anxiety from a 10 to a 3 as reported by pt.    Attendees: Patient:     Family:     Physician:  Orson Aloe, MD  04/27/2011  10:23 AM   Nursing:   Quintella Reichert, RN 04/27/2011 10:24 AM   Case Manager:  Reyes Ivan, LCSWA 04/27/2011  10:23 AM   Counselor:  Marni Griffon, LCAS 04/27/2011  10:23 AM   Other:  Juline Patch, LCSW 04/27/2011  10:23 AM   Other:  Serena Colonel, NP 04/27/2011  10:23 AM   Other:     Other:       Scribe for Treatment Team:   Carmina Miller, 04/27/2011 , 10:23 AM

## 2011-04-27 NOTE — Progress Notes (Signed)
[facility] MD Progress Note  04/27/2011 3:16 PM  Diagnosis:  Axis I: Major Depression, Recurrent severe  ADL's:  Intact  Sleep: Fair  Appetite:  Good  Suicidal Ideation:  Denies any desire to harm herself  Homicidal Ideation:  Denies adamantly any homicidal thoughts.  Mental Status Examination/Evaluation: Objective:  Appearance: Casual  Eye Contact::  Good  Speech:  Clear and Coherent  Volume:  Normal  Mood:  3 /10 on a scale of 1 is the best and 10 is the worst  Anxiety: 0/10 on the same scale  Affect:  Blunt  Thought Process:  Coherent  Orientation:  Full  Thought Content:  WDL  Suicidal Thoughts:  No  Homicidal Thoughts:  No  Memory:  Immediate;   Good  Judgement:  Fair  Insight:  Fair  Psychomotor Activity:  Normal  Concentration:  Good  Recall:  Good  Akathisia:  No  AIMS (if indicated):     Assets:  Communication Skills Desire for Improvement Resilience Social Support  Sleep:      Vital Signs:Blood pressure 131/89, pulse 101, temperature 97.7 F (36.5 C), temperature source Oral, resp. rate 18, height 5\' 2"  (1.575 m), weight 92.534 kg (204 lb), last menstrual period 04/25/2011. Current Medications: Current Facility-Administered Medications  Medication Dose Route Frequency Provider Last Rate Last Dose  . acetaminophen (TYLENOL) tablet 650 mg  650 mg Oral Q6H PRN Jorje Guild, PA      . alum & mag hydroxide-simeth (MAALOX/MYLANTA) 200-200-20 MG/5ML suspension 30 mL  30 mL Oral Q4H PRN Jorje Guild, PA   30 mL at 04/27/11 0621  . citalopram (CELEXA) tablet 10 mg  10 mg Oral Daily Orson Aloe, MD   10 mg at 04/27/11 0801  . diphenhydrAMINE (BENADRYL) capsule 25 mg  25 mg Oral Once Viviann Spare, NP   25 mg at 04/26/11 2138  . ferrous sulfate tablet 325 mg  325 mg Oral BID WC Sanjuana Kava, NP   325 mg at 04/27/11 0802  . magnesium hydroxide (MILK OF MAGNESIA) suspension 30 mL  30 mL Oral Daily PRN Jorje Guild, PA      . pantoprazole (PROTONIX) EC tablet 20 mg  20 mg Oral  BID AC Orson Aloe, MD      . DISCONTD: nicotine (NICODERM CQ - dosed in mg/24 hours) patch 21 mg  21 mg Transdermal Q0600 Jorje Guild, PA        Lab Results:  Results for orders placed during the hospital encounter of 04/25/11 (from the past 48 hour(s))  VITAMIN D 25 HYDROXY     Status: Abnormal   Collection Time   04/26/11  7:30 PM      Component Value Range Comment   Vit D, 25-Hydroxy 12 (*) 30 - 89 (ng/mL)   GLUCOSE, CAPILLARY     Status: Abnormal   Collection Time   04/27/11  6:33 AM      Component Value Range Comment   Glucose-Capillary 115 (*) 70 - 99 (mg/dL)    Comment 1 Notify RN     GLUCOSE, CAPILLARY     Status: Abnormal   Collection Time   04/27/11 11:32 AM      Component Value Range Comment   Glucose-Capillary 218 (*) 70 - 99 (mg/dL)     Physical Findings: AIMS:  , ,  ,  ,    CIWA:    COWS:     Treatment Plan Summary: Daily contact with patient to assess and evaluate symptoms and progress  in treatment Medication management  Plan: Add Protonix for her stomach gas/hurting from the food here.  Orville Mena 04/27/2011, 3:16 PM

## 2011-04-27 NOTE — Progress Notes (Signed)
[facility] Family/Significant Other Suicide Prevention Education  Suicide Prevention Education:  Education Completed; Darnelle Maffucci, Emelia Loron [phone], has been identified by the patient as the family member/significant other with whom the patient will be residing, and identified as the person(s) who will aid the patient in the event of a mental health crisis (suicidal ideations/suicide attempt).  With written consent from the patient, the family member/significant other has been provided the following suicide prevention education, prior to the and/or following the discharge of the patient.  The suicide prevention education provided includes the following:  Suicide risk factors  Suicide prevention and interventions  National Suicide Hotline telephone number  [facility] assessment telephone number  [facility] Emergency Assistance 911  [facility] and/or Residential Mobile Crisis Unit telephone number  Request made of family/significant other to:  Remove weapons (e.g., guns, rifles, knives), all items previously/currently identified as safety concern.    Remove drugs/medications (over-the-counter, prescriptions, illicit drugs), all items previously/currently identified as a safety concern.  The family member/significant other verbalizes understanding of the suicide prevention education information provided.  The family member/significant other agrees to remove the items of safety concern listed above.  Marni Griffon C 04/27/2011, 1:13 PM

## 2011-04-27 NOTE — Progress Notes (Signed)
[facility] Group Notes:  (Counselor/Nursing/MHT/Case Management/Adjunct)  04/27/2011 5:48 PM  [facility] Group Notes:  (Counselor/Nursing/MHT/Case Management/Adjunct)   Type of Therapy: Group Therapy   Participation Level: Minimal   Participation Quality: Attentive and Sharing   Affect: Depressed   Cognitive: Appropriate   Insight: Limited   Engagement in Group: Limited   Engagement in Therapy: Limited   Modes of Intervention: Clarification, Education, Problem-solving, Socialization and Support    Summary of Progress/Problems:  Pt actively participated in group focused on the importance of expressing feelings.  Therapist prompted Pt to openly disclose.  Pt reported that she had a upset stomach and was not feeling well.  Pt related to others and offered appropriate feedback.  Minimal Progress noted.  Intervention somewhat effective.   Christen Butter 04/27/2011, 5:48 PM

## 2011-04-28 LAB — GLUCOSE, CAPILLARY: Glucose-Capillary: 94 mg/dL (ref 70–99)

## 2011-04-28 MED ORDER — ALBUTEROL 90 MCG/ACT IN AERS: 2.0000 | INHALATION_SPRAY | Freq: Four times a day (QID) | RESPIRATORY_TRACT | Status: DC | PRN

## 2011-04-28 MED ORDER — NAPROXEN SODIUM 220 MG PO CAPS: 2.0000 | ORAL_CAPSULE | Freq: Every day | ORAL | Status: DC | PRN

## 2011-04-28 MED ORDER — FERROUS SULFATE 325 (65 FE) MG PO TABS: 325.0000 mg | ORAL_TABLET | Freq: Two times a day (BID) | ORAL | Status: DC

## 2011-04-28 MED ORDER — CITALOPRAM HYDROBROMIDE 10 MG PO TABS: 10.0000 mg | ORAL_TABLET | Freq: Every day | ORAL | Status: DC

## 2011-04-28 MED ORDER — PANTOPRAZOLE SODIUM 20 MG PO TBEC: 20.0000 mg | DELAYED_RELEASE_TABLET | Freq: Two times a day (BID) | ORAL | Status: DC

## 2011-04-28 NOTE — Progress Notes (Addendum)
Patient alert and oriented. Denies SI/HI. States that family member will be picking her up at 2 PM. Discharge paperwork reviewed with patient. Prescriptions given. Patient given suicide prevention information in discharge packet. No further questions voiced by patient. Patient ratyes depression and hopelessness at 1/10.

## 2011-04-28 NOTE — Discharge Summary (Signed)
Physician Discharge Summary Note  Patient:  Grace Montgomery is an 21 y.o., female MRN:  [id number] DOB:  27-Feb-1991 Patient phone:  [phone] (home)  Patient address:   [address] [place] [address],   Date of Admission:  04/25/2011 Date of Discharge: 04/28/11  Reason for Admission: Suicide attempt by overdose  Discharge Diagnoses: Principal Problem:  *Major depressive disorder, recurrent episode   Axis Diagnosis:   AXIS I:  Major depressive disorder, recurrent episodes. AXIS II:  Deferred AXIS III:   Past Medical History  Diagnosis Date  . Diabetes mellitus   . Asthma   . 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 Anxiety   . Cannabis dependence   . Alcohol abuse    AXIS IV:  economic problems, occupational problems, other psychosocial or environmental problems and problems related to social environment AXIS V:  70  Level of Care:  OP  Hospital Course: This is a 21 year old African-American female, admitted to [facility] from the [facility] ED with complaints of suicidal threats. Patient at this time is under involuntary commitment. Patient reports, "I really do not know why I am in this hospital. I was told by some people that I tried to kill myself. I know I did not try and or attempt to hurt myself. I remembered taking 2 sleeping pills to help me get some sleep, that is all. While a patient in this hospital, patient received medication management as well as group counseling. She also received medication management for other medical conditions. She did attend group counseling on daily basis. She expressed how she has struggled over the years with being labelled mentally ill. She reports on daily basis an improved mood and decreased suicidal thoughts. She did attend treatment team meeting this morning and agreed with the treatment team members that she is stable for home discharge. She will be living with her grandfather to whom her 2  children are with while she is in this hospital. She will continue psychiatric care on an outpatient basis at the [facility] care services. She is provided with the address, date and time for her follow-up appointment. Patient left [facility] facility with all personal belongings via family transport. She is in no apparent distress. When asked what she has learned from being in this hospital, patient replied, "I need to take and do one thing at a time. I will try not to get overwhelmed in the morning with all I need to do, and make myself a list to better manage all of them in their order of importance.  Consults:  None  Significant Diagnostic Studies:  None  Discharge Vitals:   Blood pressure 110/67, pulse 93, temperature 98.1 F (36.7 C), temperature source Oral, resp. rate 17, height 5\' 2"  (1.575 m), weight 92.534 kg (204 lb), last menstrual period 04/25/2011.  Mental Status Exam: See Mental Status Examination and Suicide Risk Assessment completed by Attending Physician prior to discharge.  Discharge destination:  Home  Is patient on multiple antipsychotic therapies at discharge:  No   Has Patient had three or more failed trials of antipsychotic monotherapy by history:  No  Recommended Plan for Multiple Antipsychotic Therapies: NA   Medication List  As of 04/28/2011  1:01 PM   STOP taking these medications         insulin aspart 100 UNIT/ML injection      mometasone 50 MCG/ACT nasal spray  TAKE these medications      Indication    albuterol 90 MCG/ACT inhaler   Commonly known as: PROVENTIL,VENTOLIN   Inhale 2 puffs into the lungs every 6 (six) hours as needed for shortness of breath. wheezing       citalopram 10 MG tablet   Commonly known as: CELEXA   Take 1 tablet (10 mg total) by mouth daily. For depresion.       ferrous sulfate 325 (65 FE) MG tablet   Take 1 tablet (325 mg total) by mouth 2 (two) times daily with a meal. For iron replacement.       Naproxen Sodium 220  MG Caps   Take 2 capsules (440 mg total) by mouth daily as needed. For pain       pantoprazole 20 MG tablet   Commonly known as: PROTONIX   Take 1 tablet (20 mg total) by mouth 2 (two) times daily before a meal. For control of stomach acid secretion and helps GERD.            Follow-up Information    Follow up with [facility] - CST, PSR and medication management on 04/29/2011. (Appointment scheduled at 12:00 pm with Onalee Hua)    Contact information:   [address] [place], [place] [address]  [phone]         Follow-up recommendations:  Other:  Instructed patient to keep all scheduled follow-up appointments as recommended.  Comments:  Take medications as prescribed.                       Report any adverse effects promptly to your outpatient provider.  SignedArmandina Stammer I 04/28/2011, 1:01 PM

## 2011-04-28 NOTE — Progress Notes (Signed)
Patient's grandfather picked patient up at discharge. Denied SI/HI. Smiling, forward thinking.

## 2011-04-28 NOTE — Tx Team (Signed)
Interdisciplinary Treatment Plan Update (Adult)  Date:  04/28/2011  Time Reviewed:  10:54 AM   Progress in Treatment: Attending groups: Yes Participating in groups:  Yes Taking medication as prescribed: Yes Tolerating medication:  Yes Family/Significant othe contact made:  Yes - contact made with grandfather Patient understands diagnosis:  Yes Discussing patient identified problems/goals with staff:  Yes Medical problems stabilized or resolved:  Yes Denies suicidal/homicidal ideation: Yes Issues/concerns per patient self-inventory:  None identified Other: N/A  New problem(s) identified: None Identified  Reason for Continuation of Hospitalization: Stable to d/c  Interventions implemented related to continuation of hospitalization: Stable to d/c  Additional comments: N/A  Estimated length of stay: D/C today  Discharge Plan: Pt will follow up with [facility] for CST, PSR and med management  New goal(s): N/A  Review of initial/current patient goals per problem list:    1.  Goal(s): Reduce depressive symptoms  Met:  Yes  Target date: by discharge  As evidenced by: Reducing depression from a 10 to a 3 as reported by pt.  Pt denies depression.    2.  Goal (s): Reduce/Eliminate suicidal ideation  Met:  Yes  Target date: by discharge  As evidenced by: pt denies SI.      3.  Goal(s): Reduce anxiety symptoms  Met:  Yes  Target date: by discharge  As evidenced by: Reduce anxiety from a 10 to a 3 as reported by pt.  Pt denies anxiety.    Attendees: Patient:  Grace Montgomery 04/28/2011 10:56 AM   Family:     Physician:  Orson Aloe, MD  04/28/2011  10:54 AM   Nursing:   Omelia Blackwater, RN 04/28/2011 10:56 AM   Case Manager:  Reyes Ivan, LCSWA 04/28/2011  10:54 AM   Counselor:  Angus Palms, LCSW 04/28/2011  10:54 AM   Other:  Juline Patch, LCSW 04/28/2011  10:54 AM   Other:  Wilmon Arms, SW intern 04/28/2011  10:54 AM   Other:  Magdalen Spatz,  counseling intern 04/28/2011 10:56 AM   Other:      Scribe for Treatment Team:   Carmina Miller, 04/28/2011 , 10:54 AM

## 2011-04-28 NOTE — Progress Notes (Signed)
[facility] Group Notes:  (Counselor/Nursing/MHT/Case Management/Adjunct)  04/28/2011 2:13 PM  Type of Therapy:  1:15PM Group Therapy  Participation Level:  Active  Participation Quality:  Appropriate and Attentive  Affect:  Appropriate  Cognitive:  Alert and Appropriate  Insight:  Limited  Engagement in Group:  Limited  Engagement in Therapy:  Limited  Modes of Intervention:  Activity, Education and Exploration  Summary of Progress/Problems: Patient seemed to be actively engaged in the muscle relaxation exercise. Patient stated she felt "very relaxed". Patient seems invested in her treatment.   Wilmon Arms 04/28/2011, 2:13 PM  Cosigned by: Angus Palms, LCSW

## 2011-04-28 NOTE — Progress Notes (Signed)
Pt reports being in a good mood this evening. Pt primary concern this evening was being able to sleep. Pt reports minium results with the 25 mg of benadryl given Monday night. Pt suggested that 50 mg may work. Physican notified with 50 mg of benadryl being an acceptable prescribed med for this pt. Pt was not asleep at hr follow-up, but was asleep at 2300. Pt has been observed interacting well within the unit. Pt is denying any thoughts of SI/HI at this time. Pt safety remains with q57min checks.

## 2011-04-28 NOTE — Progress Notes (Signed)
DSS came to visit pt before d/c.  DSS states a new report came regarding condition of pt's home for the children.  DSS worker met with pt at this time to discuss the report.  Reyes Ivan, LCSWA 04/28/2011  2:20 PM

## 2011-04-28 NOTE — Progress Notes (Signed)
[facility] Case Management Discharge Plan:  Will you be returning to the same living situation after discharge: Yes,  return home At discharge, do you have transportation home?:Yes,  pt has transportation home Do you have the ability to pay for your medications:Yes,  access to meds  Release of information consent forms completed and in the chart;  Patient's signature needed at discharge.  Patient to Follow up at:  Follow-up Information    Follow up with [facility] - CST, PSR and medication management on 04/29/2011. (Appointment scheduled at 12:00 pm with Onalee Hua)    Contact information:   [address] [place], [place] [address]  [phone]         Patient denies SI/HI:   Yes,  pt denies SI/HI    Safety Planning and Suicide Prevention discussed:  Yes,  discussed with pt  Barrier to discharge identified:No.  Summary and Recommendations: Pt attended discharge planning group and actively participated.  Pt presents with calm mood and affect.  Pt denies depression, anxiety and SI.  Pt reports feeling stable to d/c today.  Pt discussed being eager to get home to her kids.  No recommendations from SW.  No further needs voiced by pt.  Pt stable to discharge.     Carmina Miller 04/28/2011, 1:07 PM

## 2011-04-28 NOTE — Progress Notes (Signed)
[facility] Group Notes:  (Counselor/Nursing/MHT/Case Management/Adjunct)  04/28/2011 2:02 PM  Type of Therapy:  Group Therapy  Participation Level:  Active  Participation Quality:  Appropriate, Attentive and Sharing  Affect:  Appropriate  Cognitive:  Alert and Appropriate  Insight:  Good  Engagement in Group:  Good  Engagement in Therapy:  Good  Modes of Intervention:  Education, Support and Exploration  Summary of Progress/Problems: Patient reported fearing the lost of control. She stated when she lived with her mother she would always be obedient and respectful of her mother. She then stated when she moved out of her mother's house her mother then lost her place of residence. Patient reported she allowed her mother and younger siblings to come live with her and as a result her electric bill had increased to $1,600.00. Patient then told her mother she could no longer live with her. Patient stated she made this decision because she was looking in the best interest of her children.    Wilmon Arms 04/28/2011, 2:02 PM  Cosigned by: Angus Palms, LCSW

## 2011-04-28 NOTE — [facility] Suicide Risk Assessment (Signed)
Suicide Risk Assessment  Discharge Assessment     Demographic factors:  Assessment Details Time of Assessment: Admission Information Obtained From: Patient Current Mental Status:    Risk Reduction Factors:  Risk Reduction Factors: Responsible for children under 22 years of age  CLINICAL FACTORS:   Depression:   Anhedonia Hopelessness  COGNITIVE FEATURES THAT CONTRIBUTE TO RISK:  Closed-mindedness Thought constriction (tunnel vision)    SUICIDE RISK:   Minimal: No identifiable suicidal ideation.  Patients presenting with no risk factors but with morbid ruminations; may be classified as minimal risk based on the severity of the depressive symptoms  ADL's:  Intact  Sleep: Good  Appetite:  Good  Suicidal Ideation:  Denies adamantly any suicidal thoughts. Homicidal Ideation:  Denies adamantly any homicidal thoughts.  Mental Status Examination/Evaluation: Objective:  Appearance: Casual  Eye Contact::  Good  Speech:  Clear and Coherent  Volume:  Normal  Mood:  Euthymic  Affect:  Congruent  Thought Process:  Coherent  Orientation:  Full  Thought Content:  WDL  Suicidal Thoughts:  No  Homicidal Thoughts:  No  Memory:  Immediate;   Good  Judgement:  Good  Insight:  Good  Psychomotor Activity:  Normal  Concentration:  Good  Recall:  Good  Akathisia:  No  AIMS (if indicated):     Assets:  Communication Skills Desire for Improvement Financial Resources/Insurance Housing Intimacy Leisure Time Physical Health Resilience Social Support Vocational/Educational  Sleep: Number of Hours: 6    Vital Signs: Blood pressure 110/67, pulse 93, temperature 98.1 F (36.7 C), temperature source Oral, resp. rate 17, height 5\' 2"  (1.575 m), weight 92.534 kg (204 lb), last menstrual period 04/25/2011.  Labs Results for orders placed during the hospital encounter of 04/25/11 (from the past 48 hour(s))  VITAMIN D 25 HYDROXY     Status: Abnormal   Collection Time   04/26/11  7:30 PM       Component Value Range Comment   Vit D, 25-Hydroxy 12 (*) 30 - 89 (ng/mL)   GLUCOSE, CAPILLARY     Status: Abnormal   Collection Time   04/27/11  6:33 AM      Component Value Range Comment   Glucose-Capillary 115 (*) 70 - 99 (mg/dL)    Comment 1 Notify RN     GLUCOSE, CAPILLARY     Status: Abnormal   Collection Time   04/27/11 11:32 AM      Component Value Range Comment   Glucose-Capillary 218 (*) 70 - 99 (mg/dL)   GLUCOSE, CAPILLARY     Status: Abnormal   Collection Time   04/27/11  5:08 PM      Component Value Range Comment   Glucose-Capillary 110 (*) 70 - 99 (mg/dL)   GLUCOSE, CAPILLARY     Status: Abnormal   Collection Time   04/28/11  5:54 AM      Component Value Range Comment   Glucose-Capillary 125 (*) 70 - 99 (mg/dL)   GLUCOSE, CAPILLARY     Status: Normal   Collection Time   04/28/11 11:53 AM      Component Value Range Comment   Glucose-Capillary 94  70 - 99 (mg/dL)     What pt has learned from hospital stay is that she needs to take one thing at a time.  To not get overwhelmed in the morning with all that she needs to that she needs to make herself a list to better manage all that.  Risk of self harm is elevated by  her having thought about it before, but has her children and herself and her future to live for.  Risk of harm to others is minimal in that she has not been involved in fights or had any legal charges filed on her.  PLAN: Discharge home Continue Medication List  As of 04/28/2011 12:27 PM   STOP taking these medications         insulin aspart 100 UNIT/ML injection      mometasone 50 MCG/ACT nasal spray         TAKE these medications         albuterol 90 MCG/ACT inhaler   Commonly known as: PROVENTIL,VENTOLIN   Inhale 2 puffs into the lungs every 6 (six) hours as needed for shortness of breath. wheezing      citalopram 10 MG tablet   Commonly known as: CELEXA   Take 1 tablet (10 mg total) by mouth daily. For depresion.      ferrous sulfate  325 (65 FE) MG tablet   Take 1 tablet (325 mg total) by mouth 2 (two) times daily with a meal. For iron replacement.      Naproxen Sodium 220 MG Caps   Take 2 capsules (440 mg total) by mouth daily as needed. For pain      pantoprazole 20 MG tablet   Commonly known as: PROTONIX   Take 1 tablet (20 mg total) by mouth 2 (two) times daily before a meal. For control of stomach acid secretion and helps GERD.           Nyzir Dubois 04/28/2011, 12:26 PM

## 2011-05-03 NOTE — Progress Notes (Signed)
Patient Discharge Instructions:  Admission Note Faxed,  05/03/2011 After Visit Summary Faxed,  05/03/2011 Faxed to the [facility] provider:  05/03/2011 D/C Summary Note faxed 05/03/2011 Facesheet faxed 05/03/2011  Faxed to [facility], [place] @ [phone]  Wandra Scot, 05/03/2011, 3:05 PM

## 2011-05-05 NOTE — Discharge Summary (Signed)
I agree with this D/C Summary.  

## 2011-06-19 ENCOUNTER — Encounter (HOSPITAL_COMMUNITY): Payer: Self-pay | Admitting: Emergency Medicine

## 2011-06-19 ENCOUNTER — Emergency Department (HOSPITAL_COMMUNITY)
Admission: EM | Admit: 2011-06-19 | Discharge: 2011-06-19 | Discharge status: Against Medical Advice | Payer: Medicaid Other | Attending: Emergency Medicine | Admitting: Emergency Medicine

## 2011-06-19 DIAGNOSIS — R109 Unspecified abdominal pain: Secondary | ICD-10-CM | POA: Insufficient documentation

## 2011-06-19 DIAGNOSIS — R112 Nausea with vomiting, unspecified: Secondary | ICD-10-CM | POA: Insufficient documentation

## 2011-06-19 DIAGNOSIS — R197 Diarrhea, unspecified: Secondary | ICD-10-CM | POA: Insufficient documentation

## 2011-06-19 NOTE — ED Notes (Signed)
Abdominal pain n/v/d since 0800 am

## 2011-06-19 NOTE — ED Notes (Signed)
Called x 2 without answer.  

## 2011-06-22 ENCOUNTER — Encounter: Payer: Self-pay | Admitting: Sports Medicine

## 2011-06-22 ENCOUNTER — Ambulatory Visit (INDEPENDENT_AMBULATORY_CARE_PROVIDER_SITE_OTHER): Payer: Medicaid Other | Admitting: Sports Medicine

## 2011-06-22 VITALS — BP 112/62 | HR 88 | Temp 98.8°F | Ht 63.0 in | Wt 210.0 lb

## 2011-06-22 DIAGNOSIS — J302 Other seasonal allergic rhinitis: Secondary | ICD-10-CM

## 2011-06-22 DIAGNOSIS — E669 Obesity, unspecified: Secondary | ICD-10-CM

## 2011-06-22 DIAGNOSIS — J309 Allergic rhinitis, unspecified: Secondary | ICD-10-CM

## 2011-06-22 MED ORDER — AEROCHAMBER PLUS MISC: Status: DC

## 2011-06-22 MED ORDER — BECLOMETHASONE DIPROPIONATE 40 MCG/ACT IN AERS: 2.0000 | INHALATION_SPRAY | Freq: Two times a day (BID) | RESPIRATORY_TRACT | Status: DC

## 2011-06-22 NOTE — Patient Instructions (Signed)
It was nice to meet you today.  I am sending in a prescription for an inhaled steroid (QVAR) to help treat your persistent asthma.  Please continue to take your Celexa.  Stop Your Pantoprazole.    Please come in for a lab visit first thing one morning in the next 1 to 2 weeks.  We will check your cholesterol and screen for diabetes.  Please call our office and let us know the day prior to coming in.  Please do not eat anything after midnight the day your blood is to be drawn.    Please follow up in 1 year or as needed.  Remember we have a 24 hour emergency line if you need to contact us in the event of an emergency or if you are unsure if you need to be evaluated in the Emergency Department or if your issue can wait until the our clinic opens in the morning.  Please call our office [phone]) and follow the instructions to reach our paging service.  If you have a life or limb threatening emergency, proceed to the [facility] Emergency Department or call 911.

## 2011-06-29 ENCOUNTER — Encounter: Payer: Self-pay | Admitting: Sports Medicine

## 2011-06-29 DIAGNOSIS — J4530 Mild persistent asthma, uncomplicated: Secondary | ICD-10-CM

## 2011-06-29 DIAGNOSIS — J302 Other seasonal allergic rhinitis: Secondary | ICD-10-CM | POA: Insufficient documentation

## 2011-06-29 HISTORY — DX: Mild persistent asthma, uncomplicated: J45.30

## 2011-06-29 HISTORY — DX: Other seasonal allergic rhinitis: J30.2

## 2011-06-29 HISTORY — DX: Morbid (severe) obesity due to excess calories: E66.01

## 2011-06-29 NOTE — Assessment & Plan Note (Signed)
Pt reports increasing usage of her albuterol MDI to >3 times per week.  Will start low dose controller med

## 2011-06-29 NOTE — Assessment & Plan Note (Signed)
- 

## 2011-06-29 NOTE — Assessment & Plan Note (Addendum)
Check TSH, LIPIDS & BMET & A1C as elevated 3 yrs ago and history of gestational dm

## 2011-06-29 NOTE — Assessment & Plan Note (Signed)
Will continue celexa and defer to primary psychiatrist

## 2011-06-29 NOTE — Progress Notes (Signed)
HPI:  Grace Montgomery is a 21 y.o. female presenting today to establish care.  She reports a history of Asthma, depression and gestational diabetes.  She is currently doing well and is concerned about her weight. She has returned to school and is doing well studying psychotherapy.  Hx of season allergies currently flared up with rhinorrhea and coryza but improved with zyrtec.  Was previously started on PPI for GERD like symptoms but have now resolved.  No symptoms, no heart burn, no voice changes, no cough  ROS No cough, no congestion, no fevers, no chills Hx of seasonal allergies  Past Medical Hx Reviewed: yes - see updated history section Medications Reviewed: yes - see updated history section Family History Reviewed: yes - see updated history section Social History Reviewed: yes - see updated history section  PE: GENERAL:  Young obese adult female.  Examined in [facility].  Sitting comfortably on exam table  In no discomfort; norespiratory distress.   PSYCH: Alert and appropriately interactive  HNEENT: AT/[place], MMM, no scleral icterus, EOMi THORAX: HEART: RRR, S1/S2 heard, no murmur LUNGS: CTA B, no wheezes, no crackles ABDOMEN:  +BS, soft, non-tender, no rigidity, no guarding, no masses/organomegaly EXTREMITIES: Moves all 4 extremities spontaneously, warm well perfused, no edema, bilateral DP and PT pulses 2/4.

## 2011-07-28 ENCOUNTER — Emergency Department (HOSPITAL_COMMUNITY)
Admission: EM | Admit: 2011-07-28 | Discharge: 2011-07-28 | Disposition: A | Discharge status: Home / Self Care | Payer: Medicaid Other | Attending: Emergency Medicine | Admitting: Emergency Medicine

## 2011-07-28 ENCOUNTER — Emergency Department (HOSPITAL_COMMUNITY): Payer: Medicaid Other

## 2011-07-28 ENCOUNTER — Encounter (HOSPITAL_COMMUNITY): Payer: Self-pay | Admitting: Emergency Medicine

## 2011-07-28 DIAGNOSIS — N644 Mastodynia: Secondary | ICD-10-CM | POA: Insufficient documentation

## 2011-07-28 DIAGNOSIS — F411 Generalized anxiety disorder: Secondary | ICD-10-CM | POA: Insufficient documentation

## 2011-07-28 DIAGNOSIS — F17200 Nicotine dependence, unspecified, uncomplicated: Secondary | ICD-10-CM | POA: Insufficient documentation

## 2011-07-28 DIAGNOSIS — J45909 Unspecified asthma, uncomplicated: Secondary | ICD-10-CM | POA: Insufficient documentation

## 2011-07-28 DIAGNOSIS — N6310 Unspecified lump in the right breast, unspecified quadrant: Secondary | ICD-10-CM

## 2011-07-28 DIAGNOSIS — E119 Type 2 diabetes mellitus without complications: Secondary | ICD-10-CM | POA: Insufficient documentation

## 2011-07-28 DIAGNOSIS — N63 Unspecified lump in unspecified breast: Secondary | ICD-10-CM | POA: Insufficient documentation

## 2011-07-28 DIAGNOSIS — N6459 Other signs and symptoms in breast: Secondary | ICD-10-CM | POA: Insufficient documentation

## 2011-07-28 MED ORDER — HYDROCODONE-ACETAMINOPHEN 5-325 MG PO TABS
1.0000 | ORAL_TABLET | Freq: Once | ORAL | Status: AC
  Administered 2011-07-28: 1 via ORAL
  Filled 2011-07-28: qty 1

## 2011-07-28 MED ORDER — HYDROCODONE-ACETAMINOPHEN 5-325 MG PO TABS: ORAL_TABLET | ORAL | Status: AC

## 2011-07-28 MED ORDER — DOXYCYCLINE HYCLATE 100 MG PO CAPS: 100.0000 mg | ORAL_CAPSULE | Freq: Two times a day (BID) | ORAL | Status: AC

## 2011-07-28 NOTE — Discharge Instructions (Signed)
Breast Cyst A breast cyst is a sac in your breast that is filled with fluid. This is common in women. Women can have one or many cysts. When the breasts contain many cysts, it is usually due to a noncancerous (benign) condition called fibrocystic change. These lumps form under the influence of female hormones (estrogen and progesterone). The lumps are most often located in the upper, outer portion of the breast. They are often more swollen, painful, and tender before your period starts. They usually disappear after menopause, unless you are on hormone therapy. Different types of cysts:  Macrocysts. These are cysts that are about 2 inches (5.1 cm) in diameter.   Microcysts. These are tiny cysts that you cannot feel, but that are seen with a mammogram or an ultrasound.   Galactocele. This is a cyst containing milk, which develops when and if you suddenly stop breastfeeding.   Sebaceous cyst of the skin (not in the breast tissue itself). These are not cancerous.  Breast cysts do not increase your chance of getting breast cancer. However, they must be followed and treated closely, because a cyst can be cancerous. Be sure to see your caregiver for follow-up care as recommended.  CAUSES   It is not completely known what causes a breast cyst.   Estrogen may influence the development of a breast cyst.   An overgrowth of milk glands and connective tissue in the breast can block the milk glands, causing them to fill with fluid.   Scar tissue in the breast from previous surgery may block the glands, causing a cyst.  SYMPTOMS   Feeling a smooth, round, soft lump (like a grape) in the breast that is easily moveable.   Breast discomfort or pain, especially in the area of the cyst.   Increase in size of the lump before your menstrual period, and decrease in size after your menstrual period.  DIAGNOSIS   The cyst can be felt during an exam by your caregiver.   Mammogram (breast X-ray).    Ultrasound.   Removing fluid from the cyst with a needle (fine needle aspiration).  TREATMENT   Your caregiver may feel there is no reason for treatment. He or she may watch to see if it goes away on its own.   Hormone treatment.   Needle aspiration. There is a 40% chance of the cyst recurring after aspiration.   Surgery to remove the whole cyst.  HOME CARE INSTRUCTIONS   Get a yearly exam by your caregiver.   Practice "breast self-awareness." This means understanding the normal appearance and feel of your breasts and may include breast self-examination.   Have a clinical breast exam (CBE) by a caregiver every 1 to 3 years if you are 20 to 22 years of age. After age 40, you should have a CBE every year.   Get mammogram tests as directed by your caregiver.   Only take over-the-counter pain medicine as directed by your caregiver.   Wear a good support bra, especially when exercising.   Avoid caffeine.   Reduce your salt intake, especially before your menstrual period. Too much salt can cause fluid retention, breast swelling, and discomfort.  SEEK MEDICAL CARE IF:   You feel, or think you feel, a lump in your breast.   You notice that both breasts look different than usual.   You notice that both breasts feel different than before.   Your breast is still causing pain, after your menstrual period is over.     You need medicine for breast pain and swelling that occurs with your menstrual period.  SEEK IMMEDIATE MEDICAL CARE IF:   You develop severe pain, tenderness, redness, or warmth in your breast.   You develop nipple discharge or bleeding.   Your breast lump becomes hard and painful.   You find new lumps or bumps that were not there before.   You feel lumps in your armpit (axilla).   You notice dimpling or wrinkling of the breast or nipple.   You have a fever.  Document Released: 02/22/2005 Document Revised: 02/11/2011 Document Reviewed: 06/14/2008 [employer]  Patient Information 2012 [employer]. 

## 2011-07-28 NOTE — ED Provider Notes (Addendum)
History    This chart was scribed for Gavin Pound. Oletta Lamas, MD, MD by Smitty Pluck. The patient was seen in room STRE4 and the patient's care was started at 6:24PM.   CSN: [id number]  Arrival date & time 07/28/11  1733   First MD Initiated Contact with Patient 07/28/11 1804      Chief Complaint  Patient presents with  . Breast Pain    (Consider location/radiation/quality/duration/timing/severity/associated sxs/prior treatment) The history is provided by the patient.   Grace Montgomery is a 21 y.o. female who presents to the Emergency Department complaining of waxing and waning over right breast pain onset 8 months but has worsened over past 3 days. She has noticed a lump in the area of pain. There is no radiation of pain. Denies family hx of breast cancer. Denies discharge, skin color change, fevers.    Past Medical History  Diagnosis Date  . Diabetes mellitus   . Asthma   . 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 Anxiety   . Cannabis dependence   . Alcohol abuse     History reviewed. No pertinent past surgical history.  Family History  Problem Relation Age of Onset  . Sickle cell anemia Mother   . Cancer Mother   . Depression Mother   . Diabetes Other     grandparents and aunts/uncles  . Stroke Other     grandparent    History  Substance Use Topics  . Smoking status: Current Everyday Smoker -- 0.5 packs/day  . Smokeless tobacco: Not on file  . Alcohol Use: Yes     ocassionally    OB History    Grav Para Term Preterm Abortions TAB SAB Ect Mult Living   2 2 0 0 0 0 0 0 0 2       Review of Systems  Constitutional: Negative for fever, chills and fatigue.  Respiratory: Negative for cough.   Cardiovascular: Negative for chest pain.  Gastrointestinal: Negative for nausea and vomiting.  Skin: Negative for color change and rash.    Allergies  Monistat; Banana; and Chocolate  Home Medications   Current Outpatient Rx  Name Route Sig  Dispense Refill  . ALBUTEROL 90 MCG/ACT IN AERS Inhalation Inhale 2 puffs into the lungs every 6 (six) hours as needed for shortness of breath. wheezing 17 g 0  . CITALOPRAM HYDROBROMIDE 10 MG PO TABS Oral Take 10 mg by mouth daily.    Marland Kitchen FERROUS SULFATE 325 (65 FE) MG PO TABS Oral Take 1 tablet (325 mg total) by mouth 2 (two) times daily with a meal. For iron replacement. 60 tablet 0  . NAPROXEN SODIUM 220 MG PO CAPS Oral Take 2 capsules (440 mg total) by mouth daily as needed. For pain 60 each 0  . AEROCHAMBER PLUS MISC  Use as instructed 1 each 2  . DOXYCYCLINE HYCLATE 100 MG PO CAPS Oral Take 1 capsule (100 mg total) by mouth 2 (two) times daily. 20 capsule 0  . HYDROCODONE-ACETAMINOPHEN 5-325 MG PO TABS  1-2 tablets po q 6 hours prn moderate to severe pain 20 tablet 0    BP 127/68  Pulse 106  Temp(Src) 98.6 F (37 C) (Oral)  Resp 20  SpO2 98%  Physical Exam  Nursing note and vitals reviewed. Constitutional: She is oriented to person, place, and time. She appears well-developed and well-nourished. No distress.  HENT:  Head: Normocephalic and atraumatic.  Eyes: Conjunctivae are normal.  Pulmonary/Chest: Effort  normal. No respiratory distress. Right breast exhibits inverted nipple, mass and tenderness. Right breast exhibits no nipple discharge and no skin change.    Abdominal: Soft. She exhibits no distension.  Neurological: She is oriented to person, place, and time.  Skin: Skin is warm, dry and intact. No abrasion and no rash noted. No pallor.       2 o'clock position under the areola. Silver dollar in size   Psychiatric: She has a normal mood and affect. Her behavior is normal.    ED Course  Procedures (including critical care time) DIAGNOSTIC STUDIES: Oxygen Saturation is 98% on room air, normal by my interpretation.    COORDINATION OF CARE: 6:31PM EDP discusses   Labs Reviewed - No data to display No results found.   1. Breast mass, right       MDM  I  personally performed the services described in this documentation, which was scribed in my presence. The recorded information has been reviewed and considered.     No sig induration, erythema, heat.  Very tender, underneath areola.  I spoke to Dr. Lyman Bishop with radiology who suggested pt present to Breast Center first thing in AM tomorrow, start on PO doxycycline for now and they will try to diagnose and drain lesion if appropriate.         Gavin Pound. Oletta Lamas, MD 07/28/11 1854  Gavin Pound. Oletta Lamas, MD 07/28/11 1610

## 2011-07-28 NOTE — ED Notes (Signed)
Pt c/o right sided breast pain x 3 days with lump noted

## 2011-07-28 NOTE — ED Notes (Signed)
Patient states she has a lump in her right breast that is sore x 8 months and the lump goes away from time to time. Patient states the lump came back x 1 week ago and the pain keep her up at night and the right breast looks and feel swollen. Patient getting undressed and in gown. Patient denies any other pain. Patient denies N/V/F/D.

## 2011-07-29 ENCOUNTER — Other Ambulatory Visit: Payer: Self-pay

## 2011-09-08 ENCOUNTER — Emergency Department (HOSPITAL_COMMUNITY)
Admission: EM | Admit: 2011-09-08 | Discharge: 2011-09-08 | Disposition: A | Discharge status: Home / Self Care | Payer: Medicaid Other | Source: Home / Self Care | Attending: Emergency Medicine | Admitting: Emergency Medicine

## 2011-09-08 ENCOUNTER — Encounter (HOSPITAL_COMMUNITY): Payer: Self-pay

## 2011-09-08 ENCOUNTER — Encounter (HOSPITAL_COMMUNITY): Payer: Self-pay | Admitting: Emergency Medicine

## 2011-09-08 ENCOUNTER — Emergency Department (HOSPITAL_COMMUNITY)
Admission: EM | Admit: 2011-09-08 | Discharge: 2011-09-08 | Disposition: A | Discharge status: Home / Self Care | Payer: Medicaid Other | Attending: Emergency Medicine | Admitting: Emergency Medicine

## 2011-09-08 ENCOUNTER — Ambulatory Visit
Admission: RE | Admit: 2011-09-08 | Discharge: 2011-09-08 | Disposition: A | Discharge status: Home / Self Care | Payer: Medicaid Other | Source: Ambulatory Visit

## 2011-09-08 DIAGNOSIS — E119 Type 2 diabetes mellitus without complications: Secondary | ICD-10-CM | POA: Insufficient documentation

## 2011-09-08 DIAGNOSIS — F17200 Nicotine dependence, unspecified, uncomplicated: Secondary | ICD-10-CM | POA: Insufficient documentation

## 2011-09-08 DIAGNOSIS — N611 Abscess of the breast and nipple: Secondary | ICD-10-CM

## 2011-09-08 DIAGNOSIS — Z888 Allergy status to other drugs, medicaments and biological substances status: Secondary | ICD-10-CM | POA: Insufficient documentation

## 2011-09-08 DIAGNOSIS — F3289 Other specified depressive episodes: Secondary | ICD-10-CM | POA: Insufficient documentation

## 2011-09-08 DIAGNOSIS — Z91018 Allergy to other foods: Secondary | ICD-10-CM | POA: Insufficient documentation

## 2011-09-08 DIAGNOSIS — N61 Mastitis without abscess: Secondary | ICD-10-CM | POA: Insufficient documentation

## 2011-09-08 DIAGNOSIS — J45909 Unspecified asthma, uncomplicated: Secondary | ICD-10-CM | POA: Insufficient documentation

## 2011-09-08 DIAGNOSIS — N644 Mastodynia: Secondary | ICD-10-CM

## 2011-09-08 DIAGNOSIS — F329 Major depressive disorder, single episode, unspecified: Secondary | ICD-10-CM | POA: Insufficient documentation

## 2011-09-08 DIAGNOSIS — Z79899 Other long term (current) drug therapy: Secondary | ICD-10-CM | POA: Insufficient documentation

## 2011-09-08 MED ORDER — OXYCODONE-ACETAMINOPHEN 5-325 MG PO TABS
2.0000 | ORAL_TABLET | Freq: Once | ORAL | Status: AC
  Administered 2011-09-08: 2 via ORAL
  Filled 2011-09-08: qty 2

## 2011-09-08 MED ORDER — CLINDAMYCIN HCL 300 MG PO CAPS: 300.0000 mg | ORAL_CAPSULE | Freq: Three times a day (TID) | ORAL | Status: AC

## 2011-09-08 MED ORDER — OXYCODONE-ACETAMINOPHEN 5-325 MG PO TABS: 1.0000 | ORAL_TABLET | Freq: Four times a day (QID) | ORAL | Status: AC | PRN

## 2011-09-08 MED ORDER — CLINDAMYCIN HCL 300 MG PO CAPS
300.0000 mg | ORAL_CAPSULE | Freq: Once | ORAL | Status: AC
  Administered 2011-09-08: 300 mg via ORAL
  Filled 2011-09-08: qty 1

## 2011-09-08 NOTE — ED Notes (Signed)
Patient seen in ED today sent to breast center who sent patient back to ED for evaluation for right breast abscess Pain now 0/10 states sleepy from pain medication.

## 2011-09-08 NOTE — ED Provider Notes (Addendum)
History   This chart was scribed for Forbes Cellar, MD by Charolett Bumpers . The patient was seen in room TR07C/TR07C.    CSN: [id number]  Arrival date & time 09/08/11  1046   First MD Initiated Contact with Patient 09/08/11 1114      Chief Complaint  Patient presents with  . Abscess    (Consider location/radiation/quality/duration/timing/severity/associated sxs/prior treatment) HPI Grace Montgomery is a 21 y.o. female who presents to the Emergency Department complaining of constant, moderate swelling to her left breast for the past 2 days. Patient also report associated redness and pain. Patient reports that her breast pain is a 10/10. Patient states that she was in ED 07/08/11 for the same symptoms and was prescribed doxycycline. Patient reports an incomplete course of doxycycline, but states that her symptoms subsided afterwards. Patient also reports taking tylenol with no relief. Patient states that the symptoms are alleviated some with the support of her bra. Patient denies any nipple discharge "I feel like it, but it's not there". Patient denies following up with the breast center. Patient denies any fevers or chills. Patient reports a h/o asthma and borderline diabetes. Patient reports a h/o an abscess under her left armpit in past. Patient denies any unintentional weight loss, night sweats. Patient denies any family h/o breast cancer.   Past Medical History  Diagnosis Date  . Diabetes mellitus   . Asthma   . 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 Anxiety   . Cannabis dependence   . Alcohol abuse     History reviewed. No pertinent past surgical history.  Family History  Problem Relation Age of Onset  . Sickle cell anemia Mother   . Cancer Mother   . Depression Mother   . Diabetes Other     grandparents and aunts/uncles  . Stroke Other     grandparent    History  Substance Use Topics  . Smoking status: Current Everyday Smoker -- 0.5  packs/day  . Smokeless tobacco: Not on file  . Alcohol Use: Yes     ocassionally    OB History    Grav Para Term Preterm Abortions TAB SAB Ect Mult Living   2 2 0 0 0 0 0 0 0 2       Review of Systems A complete 10 system review of systems was obtained and all systems are negative except as noted in the HPI and PMH.   Allergies  Monistat; Banana; and Chocolate  Home Medications   Current Outpatient Rx  Name Route Sig Dispense Refill  . ALBUTEROL 90 MCG/ACT IN AERS Inhalation Inhale 2 puffs into the lungs every 6 (six) hours as needed for shortness of breath. wheezing 17 g 0  . CITALOPRAM HYDROBROMIDE 10 MG PO TABS Oral Take 10 mg by mouth daily.    Marland Kitchen FERROUS SULFATE 325 (65 FE) MG PO TABS Oral Take 1 tablet (325 mg total) by mouth 2 (two) times daily with a meal. For iron replacement. 60 tablet 0  . CLINDAMYCIN HCL 300 MG PO CAPS Oral Take 1 capsule (300 mg total) by mouth 3 (three) times daily. 30 capsule 0  . OXYCODONE-ACETAMINOPHEN 5-325 MG PO TABS Oral Take 1 tablet by mouth every 6 (six) hours as needed for pain. 15 tablet 0    BP 112/76  Pulse 94  Temp 97 F (36.1 C) (Oral)  Resp 20  Ht 5\' 4"  (1.626 m)  Wt 204 lb (92.534 kg)  BMI 35.02 kg/m2  SpO2 100%  LMP 08/30/2011  Physical Exam  Nursing note and vitals reviewed. Constitutional: She is oriented to person, place, and time. She appears well-developed and well-nourished. No distress.  HENT:  Head: Normocephalic and atraumatic.  Mouth/Throat: Mucous membranes are normal.       Mucous membranes moist.   Eyes: EOM are normal. Pupils are equal, round, and reactive to light.  Neck: Neck supple. No tracheal deviation present.  Cardiovascular: Normal rate, regular rhythm and normal heart sounds.   Pulmonary/Chest: Effort normal and breath sounds normal. No respiratory distress. She has no wheezes.  Abdominal: Soft. Bowel sounds are normal. She exhibits no distension. There is no tenderness.  Musculoskeletal: Normal  range of motion. She exhibits no edema.  Neurological: She is alert and oriented to person, place, and time. No sensory deficit.  Skin: Skin is warm and dry.          Mild erythema to medial aspect of right breast. Tenderness to palpation of right breast with tenderness greater on right side of nipple +induration, no fluctuance  Psychiatric: She has a normal mood and affect. Her behavior is normal.    ED Course  Procedures (including critical care time)  DIAGNOSTIC STUDIES: Oxygen Saturation is 100% on room air, normal by my interpretation.    COORDINATION OF CARE:  11:48am- Discussed planned course of treatment with the patient, who is agreeable at this time.  12:00pm-Medication Orders: Oxycodone-acetaminophen (Percocet) 5-325 mg per tablet 2 tablet-once.    Labs Reviewed - No data to display No results found.   1. Breast pain    MDM  Breast pain. Possible cellulitis v/s abscess. D/W Radiology Dr. Manson Passey- patient to proceed to breast center for U/S-- if susp for cancer will also have mammogram performed at that time. She has been given percocet, clindamycin for home and will need to complete course for cellulitis if none of the above present.   I personally performed the services described in this documentation, which was scribed in my presence. The recorded information has been reviewed and considered.   Forbes Cellar, MD 09/08/11 1204  D/W Radiology Breast imaging center. 4.5cm abscess by U/S. Radiology plans to call surgery for I&D--if cannot arrange as an outpatient will send patient back to the Emergency Department for surgical evaluation.  Forbes Cellar, MD 09/08/11 1321

## 2011-09-08 NOTE — ED Notes (Signed)
Pt given drink and turkey sandwich  

## 2011-09-08 NOTE — ED Notes (Addendum)
Pt was seen today about c/o rt breast abscess, Pt was sent over to Breast Imaging per EDP Hyman Hopes. Breast Imaging sent pt back to ED to have abcess incision and drained. Pt states her pain right now is at a 5, Pt did state that the percocet given at the earlier ED visit has helped with the pain . Pt has no visible drainage, rt breast is tender to touch.

## 2011-09-08 NOTE — ED Notes (Signed)
Pt  States that about a month ago she was diagnosis at [facility] of having a cyst to her right nipple, pt was given doxycyline but admits that she did not complete the antibotic. Cyst on right nipple has a raise bump on nipple and swellen that is spreading to the outside of her breast, Pt states it is tender to touch. Educated pt that when she is given an antibiotic to finish the medication even if she starts feeling better because the infection can reoccur, no drainage visible from nipple. Pt states she took 2 tylenol 200 mg about 9a.m with no relieve.

## 2011-09-08 NOTE — ED Notes (Addendum)
Called pharmacy to send cleocin to POD A

## 2011-09-08 NOTE — ED Notes (Signed)
Per EDP pt refused to have IV inserted

## 2011-09-08 NOTE — ED Provider Notes (Signed)
History     CSN: [id number]  Arrival date & time 09/08/11  1349   First MD Initiated Contact with Patient 09/08/11 1418      Chief Complaint  Patient presents with  . Abscess    (Consider location/radiation/quality/duration/timing/severity/associated sxs/prior treatment) HPI  See previous note from today.  Past Medical History  Diagnosis Date  . Diabetes mellitus   . Asthma   . 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 Anxiety   . Cannabis dependence   . Alcohol abuse     History reviewed. No pertinent past surgical history.  Family History  Problem Relation Age of Onset  . Sickle cell anemia Mother   . Cancer Mother   . Depression Mother   . Diabetes Other     grandparents and aunts/uncles  . Stroke Other     grandparent    History  Substance Use Topics  . Smoking status: Current Everyday Smoker -- 0.5 packs/day  . Smokeless tobacco: Not on file  . Alcohol Use: Yes     ocassionally    OB History    Grav Para Term Preterm Abortions TAB SAB Ect Mult Living   2 2 0 0 0 0 0 0 0 2       Review of Systems  Allergies  Monistat; Banana; and Chocolate  Home Medications   Current Outpatient Rx  Name Route Sig Dispense Refill  . ALBUTEROL 90 MCG/ACT IN AERS Inhalation Inhale 2 puffs into the lungs every 6 (six) hours as needed for shortness of breath. wheezing 17 g 0  . CITALOPRAM HYDROBROMIDE 10 MG PO TABS Oral Take 10 mg by mouth daily.    Marland Kitchen CLINDAMYCIN HCL 300 MG PO CAPS Oral Take 1 capsule (300 mg total) by mouth 3 (three) times daily. 30 capsule 0  . FERROUS SULFATE 325 (65 FE) MG PO TABS Oral Take 1 tablet (325 mg total) by mouth 2 (two) times daily with a meal. For iron replacement. 60 tablet 0  . OXYCODONE-ACETAMINOPHEN 5-325 MG PO TABS Oral Take 1 tablet by mouth every 6 (six) hours as needed for pain. 15 tablet 0    BP 120/72  Pulse 97  Temp 98.3 F (36.8 C) (Oral)  Resp 16  SpO2 100%  LMP 08/30/2011  Physical  Exam  ED Course  Procedures (including critical care time)  Labs Reviewed - No data to display US Breast Right  09/08/2011  *RADIOLOGY REPORT*  Clinical Data:  Tender right breast mass with clinical findings suspicious for a breast abscess.  The patient has had a chronic infection in that portion of the breast since November 2012.  She was treated with doxycycline in May 2013.  However, she did not take all of the prescribed doxycycline after the symptoms resolved following 5 days of the antibiotic.  The symptoms subsequently recurred.  RIGHT BREAST ULTRASOUND  Comparison:  None.  On physical exam, the patient has an approximately 7 x 7 cm rounded, fluctuant, tender mass in the upper inner subareolar and periareolar regions of the right breast.  There is mild associated skin redness and warmth.  Findings: Ultrasound is performed, showing a 4.4 x 4.4 x 2.7 cm inhomogeneous fluid collection in the upper inner subareolar region of the right breast containing swirling, floating internal echoes. The surrounding breast tissue is echogenic.  IMPRESSION: 4.4 cm upper inner right breast subareolar abscess.  Incision and drainage is recommended.  This was discussed with the patient and Dr.  Hyman Hopes and the patient is returning to the Emergency Department at [facility] for the procedure.  Annual screening mammography is recommended beginning at age 34.  RECOMMENDATION:  1.  Right breast incision and drainage and clinical management of the abscess of the breast. 2.  Bilateral screening mammogram at age 13.  BI-RADS CATEGORY 2:  Benign finding(s).  Original Report Authenticated By: Darrol Angel, M.D.     1. Breast abscess       MDM    Patient sent back from Breast Imaging and surgery at the center could not I&D this afternoon. She was seen by general surgery and refused I&D. Given PO Clindamycin in the ED. Recommended f/u with her PMD or UCC in 2 days for recheck. Given Precautions for return.           Forbes Cellar, MD 09/08/11 [phone]

## 2011-09-08 NOTE — ED Notes (Signed)
Pt presents with 3 day h/o abscess to R breast.  Pt reports minimal clear drainage to area that was first noted yesterday.  Pt reports h/o same to same area.

## 2011-09-08 NOTE — Consult Note (Signed)
Reason for Consult: Right breast abcess Referring Physician: Dr. Forbes Cellar HPI: Grace Montgomery is a 21 y.o. female who presents to the Emergency Department complaining of constant, moderate swelling to her left breast for the past 2 days. Patient also report associated redness and pain. Patient reports that her breast pain is a 10/10. Patient states that she was in ED 07/08/11 for the same symptoms and was prescribed doxycycline. Patient reports an incomplete course of doxycycline, but states that her symptoms subsided afterwards. Patient also reports taking tylenol with no relief. Patient states that the symptoms are alleviated some with the support of her bra. Patient denies any nipple discharge "I feel like it, but it's not there". Patient denies following up with the breast center. Patient denies any fevers or chills. Patient reports a h/o asthma and borderline diabetes. Patient reports a h/o an abscess under her left armpit in past. Patient denies any unintentional weight loss, night sweats. Patient denies any family h/o breast cancer.    Past Medical History  Diagnosis Date  . Diabetes mellitus   . Asthma   . 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 Anxiety   . Cannabis dependence   . Alcohol abuse     History reviewed. No pertinent past surgical history.  Family History  Problem Relation Age of Onset  . Sickle cell anemia Mother   . Cancer Mother   . Depression Mother   . Diabetes Other     grandparents and aunts/uncles  . Stroke Other     grandparent    Social History:  reports that she has been smoking.  She does not have any smokeless tobacco history on file. She reports that she drinks alcohol. She reports that she does not use illicit drugs.  Allergies:  Allergies  Allergen Reactions  . Monistat (Tioconazole) Swelling  . Banana Other (See Comments)    Throat itching  . Chocolate Other (See Comments)    Ears & throat itch    Medications:  I have reviewed the patient's current medications.  No results found for this or any previous visit (from the past 48 hour(s)).  US Breast Right  09/08/2011  *RADIOLOGY REPORT*  Clinical Data:  Tender right breast mass with clinical findings suspicious for a breast abscess.  The patient has had a chronic infection in that portion of the breast since November 2012.  She was treated with doxycycline in May 2013.  However, she did not take all of the prescribed doxycycline after the symptoms resolved following 5 days of the antibiotic.  The symptoms subsequently recurred.  RIGHT BREAST ULTRASOUND  Comparison:  None.  On physical exam, the patient has an approximately 7 x 7 cm rounded, fluctuant, tender mass in the upper inner subareolar and periareolar regions of the right breast.  There is mild associated skin redness and warmth.  Findings: Ultrasound is performed, showing a 4.4 x 4.4 x 2.7 cm inhomogeneous fluid collection in the upper inner subareolar region of the right breast containing swirling, floating internal echoes. The surrounding breast tissue is echogenic.  IMPRESSION: 4.4 cm upper inner right breast subareolar abscess.  Incision and drainage is recommended.  This was discussed with the patient and Dr. Hyman Hopes and the patient is returning to the Emergency Department at [facility] for the procedure.  Annual screening mammography is recommended beginning at age 13.  RECOMMENDATION:  1.  Right breast incision and drainage and clinical management of the abscess  of the breast. 2.  Bilateral screening mammogram at age 56.  BI-RADS CATEGORY 2:  Benign finding(s).  Original Report Authenticated By: Darrol Angel, M.D.    Review of Systems  Constitutional: Negative.   HENT: Negative.   Eyes: Negative.   Respiratory: Negative.   Cardiovascular: Negative.   Gastrointestinal: Negative.   Genitourinary: Negative.   Musculoskeletal: Negative.   Skin: Negative.   Neurological: Negative.     Endo/Heme/Allergies: Negative.   Psychiatric/Behavioral: Negative.    Blood pressure 120/72, pulse 97, temperature 98.3 F (36.8 C), temperature source Oral, resp. rate 16, last menstrual period 08/30/2011, SpO2 100.00%. Physical Exam  Constitutional: She is oriented to person, place, and time. She appears well-developed and well-nourished.  HENT:  Head: Normocephalic and atraumatic.  Cardiovascular: Normal rate and regular rhythm.   Respiratory: Effort normal and breath sounds normal.  Neurological: She is alert and oriented to person, place, and time.  Skin: Skin is warm and dry.  Psychiatric: She has a normal mood and affect.    Assessment/Plan: Recurrent right breast abcess confirmed by ultrasound measures 4.4 x 4.4 x 2.7 cm  In homogeneous fluid collection in the upper inner subareolar region  of the right breast containing swirling, floating internal echoes.  The surrounding breast tissue is echogenic. Patient had previously been treated with doxycycline and was improving, but stopped taking the antibiotics after 5 days "because I felt better".  Plan: 1. Agree with Clindamycin 300 mg for 10 days, patient does not wish to have I&D done. She has been strongly advised to complete this course of antibiotics and has stated that she will do so. She also stated that she would prefer to follow-up with her PCP versus urgent care clinic should her condition worsen. 2. Patient also has script for hydrocodone 5-325 for pain management.    Grace Montgomery 09/08/2011, 3:25 PM

## 2011-09-08 NOTE — ED Notes (Signed)
Surgeon at bedside.  

## 2011-09-10 ENCOUNTER — Other Ambulatory Visit: Payer: Self-pay

## 2011-10-21 ENCOUNTER — Emergency Department (HOSPITAL_COMMUNITY)
Admission: EM | Admit: 2011-10-21 | Discharge: 2011-10-21 | Discharge status: Against Medical Advice | Payer: Medicaid Other | Attending: Emergency Medicine | Admitting: Emergency Medicine

## 2011-10-21 ENCOUNTER — Encounter (HOSPITAL_COMMUNITY): Payer: Self-pay | Admitting: Adult Health

## 2011-10-21 DIAGNOSIS — R42 Dizziness and giddiness: Secondary | ICD-10-CM | POA: Insufficient documentation

## 2011-10-21 NOTE — ED Notes (Addendum)
C/o dizziness that began yesterday.  Dizziness is associated with nausea. Dizziness only happens with position change. Alert, oriented and maex4. Pt also c/o constipation. Last BM was today described as hard small stools.

## 2011-11-07 ENCOUNTER — Encounter (HOSPITAL_COMMUNITY): Payer: Self-pay | Admitting: General Practice

## 2011-11-07 ENCOUNTER — Emergency Department (HOSPITAL_COMMUNITY)
Admission: EM | Admit: 2011-11-07 | Discharge: 2011-11-07 | Disposition: A | Discharge status: Home / Self Care | Payer: Medicaid Other | Attending: Emergency Medicine | Admitting: Emergency Medicine

## 2011-11-07 DIAGNOSIS — F329 Major depressive disorder, single episode, unspecified: Secondary | ICD-10-CM | POA: Insufficient documentation

## 2011-11-07 DIAGNOSIS — F411 Generalized anxiety disorder: Secondary | ICD-10-CM | POA: Insufficient documentation

## 2011-11-07 DIAGNOSIS — J45909 Unspecified asthma, uncomplicated: Secondary | ICD-10-CM | POA: Insufficient documentation

## 2011-11-07 DIAGNOSIS — F3289 Other specified depressive episodes: Secondary | ICD-10-CM | POA: Insufficient documentation

## 2011-11-07 DIAGNOSIS — F17200 Nicotine dependence, unspecified, uncomplicated: Secondary | ICD-10-CM | POA: Insufficient documentation

## 2011-11-07 DIAGNOSIS — J029 Acute pharyngitis, unspecified: Secondary | ICD-10-CM

## 2011-11-07 NOTE — ED Provider Notes (Signed)
History     CSN: [id number]  Arrival date & time 11/07/11  1144   First MD Initiated Contact with Patient 11/07/11 1203      Chief Complaint  Patient presents with  . URI  . Sore Throat    (Consider location/radiation/quality/duration/timing/severity/associated sxs/prior treatment) HPI Comments: Patient is a 21 year old female who presents for URI symptoms, and sore throat. Symptoms started yesterday, and had persisted.  No headache, no ear pain. Patient with sore throat., No trouble swallowing or drooling.  No rash, no abdominal pain. Patient with mild congestion and rhinorrhea. Patient's son with similar symptoms.    Patient is a 21 y.o. female presenting with URI and pharyngitis. The history is provided by the patient. No language interpreter was used.  URI The primary symptoms include sore throat, cough and myalgias. Primary symptoms do not include fever, swollen glands, abdominal pain, nausea, vomiting or rash. The current episode started yesterday. This is a new problem. The problem has not changed since onset. The sore throat began yesterday. The sore throat has been unchanged since its onset. The sore throat is mild in intensity. The sore throat is not accompanied by trouble swallowing, drooling, hoarse voice or stridor.  The onset of the illness is associated with exposure to sick contacts. Symptoms associated with the illness include sinus pressure, congestion and rhinorrhea. The illness is not associated with chills. The following treatments were addressed: NSAIDs were effective.  Sore Throat Pertinent negatives include no abdominal pain.    Past Medical History  Diagnosis Date  . Asthma   . 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 Anxiety   . Cannabis dependence     History reviewed. No pertinent past surgical history.  Family History  Problem Relation Age of Onset  . Sickle cell anemia Mother   . Cancer Mother   . Depression Mother   .  Diabetes Other     grandparents and aunts/uncles  . Stroke Other     grandparent    History  Substance Use Topics  . Smoking status: Current Everyday Smoker -- 0.5 packs/day  . Smokeless tobacco: Not on file  . Alcohol Use: Yes     ocassionally    OB History    Grav Para Term Preterm Abortions TAB SAB Ect Mult Living   2 2 0 0 0 0 0 0 0 2       Review of Systems  Constitutional: Negative for fever and chills.  HENT: Positive for congestion, sore throat, rhinorrhea and sinus pressure. Negative for hoarse voice, drooling and trouble swallowing.   Respiratory: Positive for cough. Negative for stridor.   Gastrointestinal: Negative for nausea, vomiting and abdominal pain.  Musculoskeletal: Positive for myalgias.  Skin: Negative for rash.  All other systems reviewed and are negative.    Allergies  Monistat; Banana; and Chocolate  Home Medications   Current Outpatient Rx  Name Route Sig Dispense Refill  . ALBUTEROL SULFATE HFA 108 (90 BASE) MCG/ACT IN AERS Inhalation Inhale 2 puffs into the lungs every 6 (six) hours as needed. For shortness of breath.    Marland Kitchen CITALOPRAM HYDROBROMIDE 10 MG PO TABS Oral Take 10 mg by mouth daily.      BP 121/75  Pulse 102  Temp 98.4 F (36.9 C) (Oral)  Resp 22  Wt 207 lb (93.895 kg)  SpO2 97%  Physical Exam  Nursing note and vitals reviewed. Constitutional: She is oriented to person, place, and time. She appears well-developed  and well-nourished.  HENT:  Head: Normocephalic and atraumatic.  Right Ear: External ear normal.  Left Ear: External ear normal.  Mouth/Throat: No oropharyngeal exudate.       Slight red oralpharynx, no exudate  Eyes: Conjunctivae and EOM are normal.  Neck: Normal range of motion. Neck supple.  Cardiovascular: Normal rate, normal heart sounds and intact distal pulses.   Pulmonary/Chest: Effort normal and breath sounds normal.  Abdominal: Soft. Bowel sounds are normal. There is no tenderness. There is no  rebound.  Musculoskeletal: Normal range of motion.  Neurological: She is alert and oriented to person, place, and time.  Skin: Skin is warm.    ED Course  Procedures (including critical care time)   Labs Reviewed  RAPID STREP SCREEN   No results found.   1. Pharyngitis       MDM  21 year old with sore throat, possible strep, will obtain rapid test. No physical signs of peritonsillar abscess on exam or by history.  Likely viral.   Strep is negative. Patient with likely viral pharyngitis. Discussed symptomatic care. Discussed signs that warrant reevaluation. Patient to followup with PCP in 2-3 days if not improved.         Chrystine Oiler, MD 11/07/11 1330

## 2011-11-07 NOTE — ED Notes (Signed)
Pt c/o of cold s/s, sore throat, and chills. No meds today.

## 2011-11-28 ENCOUNTER — Emergency Department (HOSPITAL_COMMUNITY)
Admission: EM | Admit: 2011-11-28 | Discharge: 2011-11-28 | Disposition: A | Discharge status: Home / Self Care | Payer: Medicaid Other | Attending: Emergency Medicine | Admitting: Emergency Medicine

## 2011-11-28 ENCOUNTER — Emergency Department (HOSPITAL_COMMUNITY): Payer: Medicaid Other

## 2011-11-28 ENCOUNTER — Encounter (HOSPITAL_COMMUNITY): Payer: Self-pay | Admitting: Emergency Medicine

## 2011-11-28 DIAGNOSIS — F3289 Other specified depressive episodes: Secondary | ICD-10-CM | POA: Insufficient documentation

## 2011-11-28 DIAGNOSIS — N12 Tubulo-interstitial nephritis, not specified as acute or chronic: Secondary | ICD-10-CM

## 2011-11-28 DIAGNOSIS — Z87891 Personal history of nicotine dependence: Secondary | ICD-10-CM | POA: Insufficient documentation

## 2011-11-28 DIAGNOSIS — J45909 Unspecified asthma, uncomplicated: Secondary | ICD-10-CM | POA: Insufficient documentation

## 2011-11-28 DIAGNOSIS — F411 Generalized anxiety disorder: Secondary | ICD-10-CM | POA: Insufficient documentation

## 2011-11-28 DIAGNOSIS — F329 Major depressive disorder, single episode, unspecified: Secondary | ICD-10-CM | POA: Insufficient documentation

## 2011-11-28 LAB — CBC WITH DIFFERENTIAL/PLATELET
Basophils Relative: 0 % (ref 0–1)
Eosinophils Absolute: 0.1 10*3/uL (ref 0.0–0.7)
Eosinophils Relative: 1 % (ref 0–5)
Hemoglobin: 8.5 g/dL — ABNORMAL LOW (ref 12.0–15.0)
Lymphs Abs: 1.4 10*3/uL (ref 0.7–4.0)
MCH: 22 pg — ABNORMAL LOW (ref 26.0–34.0)
MCHC: 30.7 g/dL (ref 30.0–36.0)
MCV: 71.6 fL — ABNORMAL LOW (ref 78.0–100.0)
Monocytes Absolute: 0.8 10*3/uL (ref 0.1–1.0)
Neutro Abs: 9.7 10*3/uL — ABNORMAL HIGH (ref 1.7–7.7)
Neutrophils Relative %: 80 % — ABNORMAL HIGH (ref 43–77)
RBC: 3.87 MIL/uL (ref 3.87–5.11)

## 2011-11-28 LAB — URINALYSIS, ROUTINE W REFLEX MICROSCOPIC
Glucose, UA: NEGATIVE mg/dL
Protein, ur: 30 mg/dL — AB
Urobilinogen, UA: 1 mg/dL (ref 0.0–1.0)

## 2011-11-28 LAB — COMPREHENSIVE METABOLIC PANEL
ALT: 7 U/L (ref 0–35)
BUN: 7 mg/dL (ref 6–23)
CO2: 24 mEq/L (ref 19–32)
Calcium: 9 mg/dL (ref 8.4–10.5)
Creatinine, Ser: 0.99 mg/dL (ref 0.50–1.10)
GFR calc Af Amer: >90 mL/min (ref >90)
GFR calc non Af Amer: 80 mL/min — ABNORMAL LOW (ref >90)
Glucose, Bld: 100 mg/dL — ABNORMAL HIGH (ref 70–99)
Sodium: 134 mEq/L — ABNORMAL LOW (ref 135–145)
Total Protein: 7.3 g/dL (ref 6.0–8.3)

## 2011-11-28 LAB — POCT PREGNANCY, URINE: Preg Test, Ur: NEGATIVE

## 2011-11-28 LAB — URINE MICROSCOPIC-ADD ON

## 2011-11-28 LAB — LIPASE, BLOOD: Lipase: 11 U/L (ref 11–59)

## 2011-11-28 LAB — MONONUCLEOSIS SCREEN: Mono Screen: NEGATIVE

## 2011-11-28 LAB — RAPID STREP SCREEN (MED CTR MEBANE ONLY): Streptococcus, Group A Screen (Direct): NEGATIVE

## 2011-11-28 MED ORDER — SODIUM CHLORIDE 0.9 % IV SOLN
1000.0000 mL | INTRAVENOUS | Status: DC
  Administered 2011-11-28: 1000 mL via INTRAVENOUS

## 2011-11-28 MED ORDER — SODIUM CHLORIDE 0.9 % IV SOLN
1000.0000 mL | Freq: Once | INTRAVENOUS | Status: AC
  Administered 2011-11-28: 1000 mL via INTRAVENOUS

## 2011-11-28 MED ORDER — CIPROFLOXACIN HCL 500 MG PO TABS: 500.0000 mg | ORAL_TABLET | Freq: Two times a day (BID) | ORAL | Status: DC

## 2011-11-28 MED ORDER — OXYCODONE-ACETAMINOPHEN 5-325 MG PO TABS
2.0000 | ORAL_TABLET | Freq: Once | ORAL | Status: AC
  Administered 2011-11-28: 2 via ORAL
  Filled 2011-11-28: qty 2

## 2011-11-28 MED ORDER — ONDANSETRON 8 MG PO TBDP: 8.0000 mg | ORAL_TABLET | Freq: Three times a day (TID) | ORAL | Status: DC | PRN

## 2011-11-28 MED ORDER — DEXTROSE 5 % IV SOLN
1.0000 g | INTRAVENOUS | Status: DC
  Administered 2011-11-28: 1 g via INTRAVENOUS
  Filled 2011-11-28: qty 10

## 2011-11-28 MED ORDER — OXYCODONE-ACETAMINOPHEN 5-325 MG PO TABS: 1.0000 | ORAL_TABLET | Freq: Four times a day (QID) | ORAL | Status: DC | PRN

## 2011-11-28 MED ORDER — ONDANSETRON HCL 4 MG/2ML IJ SOLN
4.0000 mg | Freq: Once | INTRAMUSCULAR | Status: DC
Start: 2011-11-28 — End: 2011-11-28
  Filled 2011-11-28: qty 2

## 2011-11-28 MED ORDER — ACETAMINOPHEN 325 MG PO TABS
650.0000 mg | ORAL_TABLET | Freq: Four times a day (QID) | ORAL | Status: DC | PRN
  Administered 2011-11-28: 650 mg via ORAL
  Filled 2011-11-28: qty 2

## 2011-11-28 NOTE — ED Notes (Signed)
Pt aware of need for urine sample. Pt unable to give sample at this time. Will call when able.

## 2011-11-28 NOTE — ED Notes (Signed)
Pt states that she started feeling bad Friday with general aches and fever.  Pt felt better on Saturday morning after taking some cold/flu medicine so she took her kids out but later in the day pt felt much worse.  Today, pt is c/o abdominal pain and generalized body aches. States that sometimes she feels nauseous but not currently.  Has been having cold sweats, shivering.

## 2011-11-28 NOTE — ED Notes (Signed)
To ED via private vehicle with c/o fever, abd pain, cough, started 2 days ago-- had flu in past. Fever for two days

## 2011-11-28 NOTE — ED Notes (Signed)
Pt also states that she thinks she might have pneumonia because she went outside with shoes on.

## 2011-11-28 NOTE — ED Provider Notes (Signed)
History     CSN: [id number]  Arrival date & time 11/28/11  1222   First MD Initiated Contact with Patient 11/28/11 1255      Chief Complaint  Patient presents with  . Generalized Body Aches  . Fever     Patient is a 21 y.o. female presenting with fever. The history is provided by the patient.  Fever Primary symptoms of the febrile illness include fever, cough and abdominal pain. Primary symptoms do not include vomiting, diarrhea, dysuria or rash. The current episode started 2 days ago. This is a new problem. The problem has been gradually worsening.  The abdominal pain is located in the epigastric region.  No tick bite, no rash, no foreign travel.  Imm utd.  Past Medical History  Diagnosis Date  . Asthma   . 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 Anxiety   . Cannabis dependence     History reviewed. No pertinent past surgical history.  Family History  Problem Relation Age of Onset  . Sickle cell anemia Mother   . Cancer Mother   . Depression Mother   . Diabetes Other     grandparents and aunts/uncles  . Stroke Other     grandparent    History  Substance Use Topics  . Smoking status: Former Smoker -- 0.5 packs/day  . Smokeless tobacco: Not on file  . Alcohol Use: Yes     ocassionally    OB History    Grav Para Term Preterm Abortions TAB SAB Ect Mult Living   2 2 0 0 0 0 0 0 0 2       Review of Systems  Constitutional: Positive for fever and chills.  HENT:       Halitosis   Respiratory: Positive for cough.   Gastrointestinal: Positive for abdominal pain. Negative for vomiting and diarrhea.  Genitourinary: Negative for dysuria, vaginal bleeding and vaginal discharge.  Skin: Negative for rash.  All other systems reviewed and are negative.    Allergies  Monistat; Banana; and Chocolate  Home Medications   Current Outpatient Rx  Name Route Sig Dispense Refill  . ALBUTEROL SULFATE HFA 108 (90 BASE) MCG/ACT IN AERS Inhalation  Inhale 2 puffs into the lungs every 6 (six) hours as needed. For shortness of breath.    Marland Kitchen CITALOPRAM HYDROBROMIDE 10 MG PO TABS Oral Take 10 mg by mouth daily.      BP 118/74  Pulse 120  Temp 103.1 F (39.5 C) (Oral)  Resp 20  SpO2 100%  LMP 11/19/2011  Physical Exam  Nursing note and vitals reviewed. Constitutional: She appears well-developed and well-nourished. No distress.  HENT:  Head: Normocephalic and atraumatic.  Right Ear: External ear normal.  Left Ear: External ear normal.  Mouth/Throat: No oropharyngeal exudate.  Eyes: Conjunctivae normal are normal. Right eye exhibits no discharge. Left eye exhibits no discharge. No scleral icterus.  Neck: Neck supple. No tracheal deviation present.  Cardiovascular: Regular rhythm and intact distal pulses.  Tachycardia present.   Pulmonary/Chest: Effort normal and breath sounds normal. No stridor. No respiratory distress. She has no wheezes. She has no rales.  Abdominal: Soft. Bowel sounds are normal. She exhibits no distension and no mass. There is no tenderness. There is no rebound and no guarding.  Musculoskeletal: She exhibits no edema and no tenderness.  Neurological: She is alert. She has normal strength. No sensory deficit. Cranial nerve deficit:  no gross defecits noted. She exhibits normal muscle  tone. She displays no seizure activity. Coordination normal.  Skin: Skin is warm and dry. No rash noted.  Psychiatric: She has a normal mood and affect.    ED Course  Procedures (including critical care time)  Labs Reviewed  CBC WITH DIFFERENTIAL - Abnormal; Notable for the following:    WBC 12.0 (*)     Hemoglobin 8.5 (*)     HCT 27.7 (*)     MCV 71.6 (*)     MCH 22.0 (*)     RDW 18.6 (*)     Neutrophils Relative 80 (*)     Neutro Abs 9.7 (*)     All other components within normal limits  COMPREHENSIVE METABOLIC PANEL - Abnormal; Notable for the following:    Sodium 134 (*)     Glucose, Bld 100 (*)     Albumin 3.3 (*)       GFR calc non Af Amer 80 (*)     All other components within normal limits  URINALYSIS, ROUTINE W REFLEX MICROSCOPIC - Abnormal; Notable for the following:    APPearance CLOUDY (*)     Hgb urine dipstick SMALL (*)     Ketones, ur TRACE (*)     Protein, ur 30 (*)     Leukocytes, UA LARGE (*)     All other components within normal limits  URINE MICROSCOPIC-ADD ON - Abnormal; Notable for the following:    Squamous Epithelial / LPF MANY (*)     Bacteria, UA FEW (*)     All other components within normal limits  LIPASE, BLOOD  RAPID STREP SCREEN  POCT PREGNANCY, URINE  MONONUCLEOSIS SCREEN  URINE CULTURE   Dg Chest 2 View  11/28/2011  *RADIOLOGY REPORT*  Clinical Data: Cough, fever  CHEST - 2 VIEW  Comparison: 05/26/2010  Findings: Cardiomediastinal silhouette is unremarkable.  No acute infiltrate or pleural effusion.  No pulmonary edema.  Central mild bronchitic changes.  Bony thorax is stable.  IMPRESSION: No acute infiltrate or pulmonary edema.  Central mild bronchitic changes.   Original Report Authenticated By: Natasha Mead, M.D.      1. Pyelonephritis       MDM  Patient appears to have uti/pyelonephritis.  On repeat exam she is still not having any adbominal ttp.  Doubt PID, appendicitis.  Pt is tolerating PO here, eating a sandwich and crackers.  Will dc home on oral abx.  Discussed close follow up.        Celene Kras, MD 11/28/11 [phone]

## 2011-11-30 LAB — URINE CULTURE: Colony Count: >=100000

## 2011-12-01 ENCOUNTER — Telehealth (HOSPITAL_COMMUNITY): Payer: Self-pay | Admitting: Emergency Medicine

## 2011-12-01 NOTE — ED Notes (Signed)
Results received from [facility].  URNC, >/= 100,000 colonies -> E Coli.  Rx given in ED for Cipro -> sensitive to the same.  Chart appended per protocol.  

## 2012-07-20 ENCOUNTER — Ambulatory Visit: Payer: Self-pay | Admitting: Sports Medicine

## 2012-08-15 ENCOUNTER — Encounter: Payer: Self-pay | Admitting: Sports Medicine

## 2012-08-15 ENCOUNTER — Ambulatory Visit (INDEPENDENT_AMBULATORY_CARE_PROVIDER_SITE_OTHER): Payer: Self-pay | Admitting: Sports Medicine

## 2012-08-15 VITALS — BP 110/57 | HR 86 | Temp 98.8°F | Ht 64.0 in | Wt 202.0 lb

## 2012-08-15 DIAGNOSIS — R21 Rash and other nonspecific skin eruption: Secondary | ICD-10-CM

## 2012-08-15 DIAGNOSIS — Z309 Encounter for contraceptive management, unspecified: Secondary | ICD-10-CM

## 2012-08-15 NOTE — Patient Instructions (Addendum)
It was nice to see you again today  Please make sure you schedule a lab appointment for some time in the next week to have your blood work tested.  Schedule a followup appointment with me to do the Nexplanon once your Medicaid has been changed.   Etonogestrel implant What is this medicine? ETONOGESTREL is a contraceptive (birth control) device. It is used to prevent pregnancy. It can be used for up to 3 years. This medicine may be used for other purposes; ask your health care provider or pharmacist if you have questions. What should I tell my health care provider before I take this medicine? They need to know if you have any of these conditions: -abnormal vaginal bleeding -blood vessel disease or blood clots -cancer of the breast, cervix, or liver -depression -diabetes -gallbladder disease -headaches -heart disease or recent heart attack -high blood pressure -high cholesterol -kidney disease -liver disease -renal disease -seizures -tobacco smoker -an unusual or allergic reaction to etonogestrel, other hormones, anesthetics or antiseptics, medicines, foods, dyes, or preservatives -pregnant or trying to get pregnant -breast-feeding How should I use this medicine? This device is inserted just under the skin on the inner side of your upper arm by a health care professional. Talk to your pediatrician regarding the use of this medicine in children. Special care may be needed. Overdosage: If you think you've taken too much of this medicine contact a poison control center or emergency room at once. Overdosage: If you think you have taken too much of this medicine contact a poison control center or emergency room at once. NOTE: This medicine is only for you. Do not share this medicine with others. What if I miss a dose? This does not apply. What may interact with this medicine? Do not take this medicine with any of the following  medications: -amprenavir -bosentan -fosamprenavir This medicine may also interact with the following medications: -barbiturate medicines for inducing sleep or treating seizures -certain medicines for fungal infections like ketoconazole and itraconazole -griseofulvin -medicines to treat seizures like carbamazepine, felbamate, oxcarbazepine, phenytoin, topiramate -modafinil -phenylbutazone -rifampin -some medicines to treat HIV infection like atazanavir, indinavir, lopinavir, nelfinavir, tipranavir, ritonavir -St. John's wort This list may not describe all possible interactions. Give your health care provider a list of all the medicines, herbs, non-prescription drugs, or dietary supplements you use. Also tell them if you smoke, drink alcohol, or use illegal drugs. Some items may interact with your medicine. What should I watch for while using this medicine? This product does not protect you against HIV infection (AIDS) or other sexually transmitted diseases. You should be able to feel the implant by pressing your fingertips over the skin where it was inserted. Tell your doctor if you cannot feel the implant. What side effects may I notice from receiving this medicine? Side effects that you should report to your doctor or health care professional as soon as possible: -allergic reactions like skin rash, itching or hives, swelling of the face, lips, or tongue -breast lumps -changes in vision -confusion, trouble speaking or understanding -dark urine -depressed mood -general ill feeling or flu-like symptoms -light-colored stools -loss of appetite, nausea -right upper belly pain -severe headaches -severe pain, swelling, or tenderness in the abdomen -shortness of breath, chest pain, swelling in a leg -signs of pregnancy -sudden numbness or weakness of the face, arm or leg -trouble walking, dizziness, loss of balance or coordination -unusual vaginal bleeding, discharge -unusually weak or  tired -yellowing of the eyes or skin Side effects  that usually do not require medical attention (Report these to your doctor or health care professional if they continue or are bothersome.): -acne -breast pain -changes in weight -cough -fever or chills -headache -irregular menstrual bleeding -itching, burning, and vaginal discharge -pain or difficulty passing urine -sore throat This list may not describe all possible side effects. Call your doctor for medical advice about side effects. You may report side effects to FDA at 1-800-FDA-1088. Where should I keep my medicine? This drug is given in a hospital or clinic and will not be stored at home. NOTE: This sheet is a summary. It may not cover all possible information. If you have questions about this medicine, talk to your doctor, pharmacist, or health care provider.  2013, [employer]vier/Gold Standard. (11/15/2008 3:54:17 PM)

## 2012-08-22 DIAGNOSIS — R21 Rash and other nonspecific skin eruption: Secondary | ICD-10-CM | POA: Insufficient documentation

## 2012-08-22 DIAGNOSIS — Z309 Encounter for contraceptive management, unspecified: Secondary | ICD-10-CM | POA: Insufficient documentation

## 2012-08-22 NOTE — Assessment & Plan Note (Signed)
Appears to be a bruise.  Encourage her to stay out of his son with this and to avoid any excessive sun exposure as this could worsening the hyperpigmentation.  Spontaneously improving

## 2012-08-22 NOTE — Assessment & Plan Note (Addendum)
Patient is sexually active.  Discussed chemical contraception does not decrease STI Potential interested in the Nexplanon.  Discussed the risks benefits and expected bleeding duration.  Also brought up Mirena the patient is uneasy with an IUD Encouraged to use condoms with withdrawal until Nexplanon is placed Greater than 50% of this 25 minute visit was spent in direct patient counseling. > Patient to schedule a followup appointment in my clinic for Nexplanon placement (30 min appointment).

## 2012-08-22 NOTE — Progress Notes (Signed)
  [facility]  Patient name: Grace Montgomery MRN [id number]  Date of birth: 23-Jul-1990  CC & HPI:  Grace Montgomery is a 22 y.o. female presenting today for:  # Contraception Management:  Patient is interested in contraception.  She is not using any chemical contraception at this point.  Condoms only.  She is monogamous.  She's not wishing to become pregnant.  She reports that her periods are regular lasting between 3-5 days every 28 days with moderate amount of bleeding with heaviest amount on the first day.   OB History   Grav Para Term Preterm Abortions TAB SAB Ect Mult Living   2 2 0 0 0 0 0 0 0 2      # Outc Date GA Lbr Len/2nd Wgt Sex Del Anes PTL Lv   1 PAR            2 PAR               # Skin Lesion:  She notices a single dark lesion on the left volar aspect of her forearm.  [place] the [place] and she is unsure as to what happened but she woke up and there is a small bruise-like appearance.  This is starting to go away on its own.  Has been less than one week.  There's no erythema, minimally painful with deep palpation   ROS:  No fevers, no chills, no dizziness, no orthopnea,  Pertinent History Reviewed:  Medical & Surgical Hx:  Reviewed: Significant for obesity, major depressive disorder including hospitalization for suicidality with accidental overdose of Tylenol Medications: Reviewed & Updated - see associated section Social History: Reviewed -  reports that she has quit smoking. She does not have any smokeless tobacco history on file.  Objective Findings:  Vitals: BP 110/57  Pulse 86  Temp(Src) 98.8 F (37.1 C) (Oral)  Ht 5\' 4"  (1.626 m)  Wt 202 lb (91.627 kg)  BMI 34.66 kg/m2  LMP 08/15/2012  PE: GENERAL:  Young Adult AA   female. In no discomfort; no respiratory distress. PSYCH: Alert and appropriately interactive; Insight:Good  Mood is appropriate   H&N: AT/[place], trachea midline EENT:  MMM, no scleral icterus, EOMi HEART: RRR,  S1/S2 heard, no murmur LUNGS: CTA B, no wheezes, no crackles EXTREMITIES: Moves all 4 extremities spontaneously, warm well perfused, no edema, bilateral DP and PT pulses 2/4.   aproximately 3 cm diameter purpuric lesion on the left volar aspect of her forearm.  No erythema, non-blanchable no other rash appreciated    Assessment & Plan:

## 2012-08-29 ENCOUNTER — Other Ambulatory Visit: Payer: Self-pay

## 2013-02-27 ENCOUNTER — Emergency Department (HOSPITAL_COMMUNITY)
Admission: EM | Admit: 2013-02-27 | Discharge: 2013-02-27 | Disposition: A | Discharge status: Home / Self Care | Payer: Self-pay | Attending: Emergency Medicine | Admitting: Emergency Medicine

## 2013-02-27 ENCOUNTER — Encounter (HOSPITAL_COMMUNITY): Payer: Self-pay | Admitting: Emergency Medicine

## 2013-02-27 ENCOUNTER — Emergency Department (HOSPITAL_COMMUNITY): Payer: Self-pay

## 2013-02-27 DIAGNOSIS — R142 Eructation: Secondary | ICD-10-CM | POA: Insufficient documentation

## 2013-02-27 DIAGNOSIS — Z87891 Personal history of nicotine dependence: Secondary | ICD-10-CM | POA: Insufficient documentation

## 2013-02-27 DIAGNOSIS — E669 Obesity, unspecified: Secondary | ICD-10-CM | POA: Insufficient documentation

## 2013-02-27 DIAGNOSIS — Z8659 Personal history of other mental and behavioral disorders: Secondary | ICD-10-CM | POA: Insufficient documentation

## 2013-02-27 DIAGNOSIS — Z3202 Encounter for pregnancy test, result negative: Secondary | ICD-10-CM | POA: Insufficient documentation

## 2013-02-27 DIAGNOSIS — R141 Gas pain: Secondary | ICD-10-CM | POA: Insufficient documentation

## 2013-02-27 DIAGNOSIS — J45909 Unspecified asthma, uncomplicated: Secondary | ICD-10-CM | POA: Insufficient documentation

## 2013-02-27 LAB — URINALYSIS, ROUTINE W REFLEX MICROSCOPIC
Bilirubin Urine: NEGATIVE
Hgb urine dipstick: NEGATIVE
Protein, ur: NEGATIVE mg/dL
Urobilinogen, UA: 0.2 mg/dL (ref 0.0–1.0)

## 2013-02-27 LAB — CBC WITH DIFFERENTIAL/PLATELET
Basophils Absolute: 0.1 10*3/uL (ref 0.0–0.1)
Basophils Relative: 0 % (ref 0–1)
HCT: 35.7 % — ABNORMAL LOW (ref 36.0–46.0)
MCHC: 33.1 g/dL (ref 30.0–36.0)
Monocytes Absolute: 0.6 10*3/uL (ref 0.1–1.0)
Neutro Abs: 6.7 10*3/uL (ref 1.7–7.7)
RDW: 16.4 % — ABNORMAL HIGH (ref 11.5–15.5)

## 2013-02-27 LAB — COMPREHENSIVE METABOLIC PANEL
ALT: 9 U/L (ref 0–35)
Alkaline Phosphatase: 78 U/L (ref 39–117)
CO2: 23 mEq/L (ref 19–32)
Chloride: 102 mEq/L (ref 96–112)
GFR calc Af Amer: >90 mL/min (ref >90)
GFR calc non Af Amer: 81 mL/min — ABNORMAL LOW (ref >90)
Glucose, Bld: 101 mg/dL — ABNORMAL HIGH (ref 70–99)
Potassium: 3.8 mEq/L (ref 3.5–5.1)
Sodium: 137 mEq/L (ref 135–145)
Total Bilirubin: 0.1 mg/dL — ABNORMAL LOW (ref 0.3–1.2)
Total Protein: 7.9 g/dL (ref 6.0–8.3)

## 2013-02-27 LAB — PREGNANCY, URINE: Preg Test, Ur: NEGATIVE

## 2013-02-27 MED ORDER — ONDANSETRON HCL 4 MG/2ML IJ SOLN: 4.0000 mg | Freq: Once | INTRAMUSCULAR | Status: DC

## 2013-02-27 MED ORDER — SODIUM CHLORIDE 0.9 % IV BOLUS (SEPSIS): 1000.0000 mL | Freq: Once | INTRAVENOUS | Status: DC

## 2013-02-27 MED ORDER — HYDROCODONE-ACETAMINOPHEN 5-325 MG PO TABS: 2.0000 | ORAL_TABLET | ORAL | Status: DC | PRN

## 2013-02-27 MED ORDER — KETOROLAC TROMETHAMINE 30 MG/ML IJ SOLN: 30.0000 mg | Freq: Once | INTRAMUSCULAR | Status: DC

## 2013-02-27 MED ORDER — GI COCKTAIL ~~LOC~~
30.0000 mL | Freq: Once | ORAL | Status: AC
  Administered 2013-02-27: 30 mL via ORAL
  Filled 2013-02-27: qty 30

## 2013-02-27 MED ORDER — ONDANSETRON 4 MG PO TBDP
4.0000 mg | ORAL_TABLET | Freq: Once | ORAL | Status: DC
  Filled 2013-02-27: qty 1

## 2013-02-27 MED ORDER — SENNOSIDES-DOCUSATE SODIUM 8.6-50 MG PO TABS: 1.0000 | ORAL_TABLET | Freq: Once | ORAL | Status: DC

## 2013-02-27 MED ORDER — OXYCODONE-ACETAMINOPHEN 5-325 MG PO TABS
2.0000 | ORAL_TABLET | Freq: Once | ORAL | Status: AC
  Administered 2013-02-27: 2 via ORAL
  Filled 2013-02-27: qty 2

## 2013-02-27 NOTE — ED Notes (Signed)
Writer offered pt a cup of ginger ale.

## 2013-02-27 NOTE — ED Provider Notes (Signed)
Medical screening examination/treatment/procedure(s) were performed by non-physician practitioner and as supervising physician I was immediately available for consultation/collaboration.    Olivia Mackie, MD 02/27/13 0900

## 2013-02-27 NOTE — ED Notes (Signed)
Bed: WA21 Expected date:  Expected time:  Means of arrival:  Comments: EMS 22yo Rt sided chest pain

## 2013-02-27 NOTE — ED Notes (Signed)
Patient states that she has been having a sharp pain to her right side that radiates up towards and underneath her breath. She states that it comes and goes but last for about 5-10 minutes. She denies flu/cold like symptoms, but she is a smoker.

## 2013-02-27 NOTE — ED Provider Notes (Signed)
CSN: [id number]     Arrival date & time 02/27/13  0230 History   First MD Initiated Contact with Patient 02/27/13 0234     Chief Complaint  Patient presents with  . Flank Pain   (Consider location/radiation/quality/duration/timing/severity/associated sxs/prior Treatment) HPI   Grace Montgomery is a 22 y.o.female with a significant PMH of asthma, depression and anxiety presents to the ER with complaints of gas in her stomach. She says that she woke up around an hour ago with pain under her right breast and her side that comes and goes in waves of 5-10 minutes. She feels as though it is gas because she had this before and an acid pill helped. She describes the pain and sharp and migratory. She has not had nausea, vomiting or diarrhea. She has not had fevers, chills, weakness, lethargy. Pt says that the pain gets worse only when she straightens her right arm at its elbow. Denies having hx of gall bladder or pancreas disease.  Past Medical History  Diagnosis Date  . Asthma   . 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 Anxiety   . Cannabis dependence    History reviewed. No pertinent past surgical history. Family History  Problem Relation Age of Onset  . Sickle cell anemia Mother   . Cancer Mother   . Depression Mother   . Diabetes Other     grandparents and aunts/uncles  . Stroke Other     grandparent   History  Substance Use Topics  . Smoking status: Former Smoker -- 0.50 packs/day  . Smokeless tobacco: Not on file  . Alcohol Use: Yes     Comment: ocassionally   OB History   Grav Para Term Preterm Abortions TAB SAB Ect Mult Living   2 2 0 0 0 0 0 0 0 2      Review of Systems The patient denies anorexia, fever, weight loss,, vision loss, decreased hearing, hoarseness, chest pain, syncope, dyspnea on exertion, peripheral edema, balance deficits, hemoptysis, melena, hematochezia, severe indigestion/heartburn, hematuria, incontinence, genital sores, muscle  weakness, suspicious skin lesions, transient blindness, difficulty walking, depression, unusual weight change, abnormal bleeding, enlarged lymph nodes, angioedema, and breast masses.  Allergies  Monistat; Banana; and Chocolate  Home Medications   No current outpatient prescriptions on file. BP 112/53  Pulse 81  Temp(Src) 98.3 F (36.8 C) (Oral)  Resp 18  SpO2 100%  LMP 02/09/2013 Physical Exam  Nursing note and vitals reviewed. Constitutional: She appears well-developed and well-nourished. No distress.  Pt obese and with pendulous breasts.  HENT:  Head: Normocephalic and atraumatic.  Eyes: Pupils are equal, round, and reactive to light.  Neck: Normal range of motion. Neck supple.  Cardiovascular: Normal rate and regular rhythm.   Pulmonary/Chest: Effort normal.    Pain to bone underlying chest, very sensitive to palpation, no tenderness to the breast tissue or abnormality noted.  Abdominal: Soft. She exhibits no distension. There is no tenderness (ruq and epigastric tenderness). There is no rebound and no guarding.  Neurological: She is alert.  Skin: Skin is warm and dry.    ED Course  Procedures (including critical care time) Labs Review Labs Reviewed  COMPREHENSIVE METABOLIC PANEL - Abnormal; Notable for the following:    Glucose, Bld 101 (*)    Total Bilirubin 0.1 (*)    GFR calc non Af Amer 81 (*)    All other components within normal limits  CBC WITH DIFFERENTIAL - Abnormal; Notable for the  following:    WBC 12.1 (*)    Hemoglobin 11.8 (*)    HCT 35.7 (*)    RDW 16.4 (*)    Lymphs Abs 4.3 (*)    All other components within normal limits  URINALYSIS, ROUTINE W REFLEX MICROSCOPIC - Abnormal; Notable for the following:    APPearance CLOUDY (*)    Specific Gravity, Urine 1.004 (*)    Leukocytes, UA TRACE (*)    All other components within normal limits  URINE MICROSCOPIC-ADD ON - Abnormal; Notable for the following:    Squamous Epithelial / LPF MANY (*)    All  other components within normal limits  LIPASE, BLOOD  PREGNANCY, URINE   Imaging Review Dg Abd 2 Views  02/27/2013   CLINICAL DATA:  Epigastric and right lateral abdominal pain.  EXAM: ABDOMEN - 2 VIEW  COMPARISON:  None.  FINDINGS: The visualized bowel gas pattern is unremarkable. Scattered air and stool filled loops of colon are seen; no abnormal dilatation of small bowel loops is seen to suggest small bowel obstruction. No free intra-abdominal air is identified on the provided upright view.  The visualized osseous structures are within normal limits; the sacroiliac joints are unremarkable in appearance. The visualized lung bases are essentially clear.  IMPRESSION: Unremarkable bowel gas pattern; no intra-abdominal air seen. Small to moderate amount of stool noted in the colon.   Electronically Signed   By: Roanna Raider M.D.   On: 02/27/2013 05:51    EKG Interpretation   None       MDM  No diagnosis found.   Patient reported that the GI cocktail mildly helped alleviate her pain. Her labs are reassuring. The pain medication did help her pain to resolve. The 2 view abdominal xray showed normal bowel gas pattern with some stool retained  Treatment: pain medication and anti-gas medication  22 y.o.Grace Montgomery's evaluation in the Emergency Department is complete. It has been determined that no acute conditions requiring further emergency intervention are present at this time. The patient/guardian have been advised of the diagnosis and plan. We have discussed signs and symptoms that warrant return to the ED, such as changes or worsening in symptoms.  Vital signs are stable at discharge. Filed Vitals:   02/27/13 0316  BP: 112/53  Pulse: 81  Temp: 98.3 F (36.8 C)  Resp: 18    Patient/guardian has voiced understanding and agreed to follow-up with the PCP or specialist.     Dorthula Matas, PA-C 02/27/13 [phone]

## 2013-04-23 ENCOUNTER — Emergency Department (HOSPITAL_COMMUNITY)
Admission: EM | Admit: 2013-04-23 | Discharge: 2013-04-23 | Disposition: A | Discharge status: Home / Self Care | Payer: Medicaid Other | Attending: Emergency Medicine | Admitting: Emergency Medicine

## 2013-04-23 ENCOUNTER — Encounter (HOSPITAL_COMMUNITY): Payer: Self-pay | Admitting: Emergency Medicine

## 2013-04-23 ENCOUNTER — Emergency Department (HOSPITAL_COMMUNITY): Payer: Medicaid Other

## 2013-04-23 DIAGNOSIS — Z87891 Personal history of nicotine dependence: Secondary | ICD-10-CM | POA: Insufficient documentation

## 2013-04-23 DIAGNOSIS — Z8659 Personal history of other mental and behavioral disorders: Secondary | ICD-10-CM | POA: Insufficient documentation

## 2013-04-23 DIAGNOSIS — B001 Herpesviral vesicular dermatitis: Secondary | ICD-10-CM

## 2013-04-23 DIAGNOSIS — J45909 Unspecified asthma, uncomplicated: Secondary | ICD-10-CM | POA: Insufficient documentation

## 2013-04-23 DIAGNOSIS — R51 Headache: Secondary | ICD-10-CM | POA: Insufficient documentation

## 2013-04-23 DIAGNOSIS — Z8711 Personal history of peptic ulcer disease: Secondary | ICD-10-CM | POA: Insufficient documentation

## 2013-04-23 DIAGNOSIS — R112 Nausea with vomiting, unspecified: Secondary | ICD-10-CM | POA: Insufficient documentation

## 2013-04-23 DIAGNOSIS — R109 Unspecified abdominal pain: Secondary | ICD-10-CM | POA: Insufficient documentation

## 2013-04-23 DIAGNOSIS — J069 Acute upper respiratory infection, unspecified: Secondary | ICD-10-CM

## 2013-04-23 LAB — POCT PREGNANCY, URINE: PREG TEST UR: NEGATIVE

## 2013-04-23 MED ORDER — HYDROCODONE-HOMATROPINE 5-1.5 MG/5ML PO SYRP: 5.0000 mL | ORAL_SOLUTION | Freq: Four times a day (QID) | ORAL | Status: DC | PRN

## 2013-04-23 NOTE — ED Notes (Signed)
Per patient, states cold congestion since last Thursday-no relief with OTC meds

## 2013-04-23 NOTE — Discharge Instructions (Signed)
The xray showed no problems in your lungs, however the radiologist mentioned that your heart shape is slighltly irregular. I have sent a message to your primary care Doctor Paulla Fore) and asked that he follow up with this because you might need to be evaluated by a heart doctor.  Read the instructions below on reasons to return to the emergency department and to learn more about your diagnosis.  Use over the counter medications for symptomatic relief as we discussed (mucinex as a decongestant, Tylenol for fever/pain, Motrin/Ibuprofen for muscle aches). If prescribed a cough suppressant during your visit, do not operate heavy machinery with in 5 hours of taking this medication. Followup with your primary care doctor in 4 days if your symptoms persist.  Your more than welcome to return to the emergency department if symptoms worsen or become concerning.  Upper Respiratory Infection, Adult  An upper respiratory infection (URI) is also sometimes known as the common cold. Most people improve within 1 week, but symptoms can last up to 2 weeks. A residual cough may last even longer.   URI is most commonly caused by a virus. Viruses are NOT treated with antibiotics. You can easily spread the virus to others by oral contact. This includes kissing, sharing a glass, coughing, or sneezing. Touching your mouth or nose and then touching a surface, which is then touched by another person, can also spread the virus.   TREATMENT  Treatment is directed at relieving symptoms. There is no cure. Antibiotics are not effective, because the infection is caused by a virus, not by bacteria. Treatment may include:  Increased fluid intake. Sports drinks offer valuable electrolytes, sugars, and fluids.  Breathing heated mist or steam (vaporizer or shower).  Eating chicken soup or other clear broths, and maintaining good nutrition.  Getting plenty of rest.  Using gargles or lozenges for comfort.  Controlling fevers with ibuprofen or  acetaminophen as directed by your caregiver.  Increasing usage of your inhaler if you have asthma.  Return to work when your temperature has returned to normal.   SEEK MEDICAL CARE IF:  After the first few days, you feel you are getting worse rather than better.  You develop worsening shortness of breath, or brown or red sputum. These may be signs of pneumonia.  You develop yellow or brown nasal discharge or pain in the face, especially when you bend forward. These may be signs of sinusitis.  You develop a fever, swollen neck glands, pain with swallowing, or white areas in the back of your throat. These may be signs of strep throat.

## 2013-04-23 NOTE — ED Notes (Signed)
Patient c/o nasal congestion and sinus pressure. Patient also has cough that often causes her to vomit. Patient also c/o chest pain associated with her cough. PA at bedside.

## 2013-04-23 NOTE — ED Provider Notes (Signed)
CSN: [id number]     Arrival date & time 04/23/13  1120 History  This chart was scribed for non-physician practitioner, Nita Sickle, working with Hoy Morn, MD by Celesta Gentile, ED Scribe. This patient was seen in room WTR5/WTR5 and the patient's care was started at 12:29 PM.  Chief Complaint  Patient presents with  . Nasal Congestion   The history is provided by the patient. No language interpreter was used.   HPI Comments: SUBJECTIVE:  Grace Montgomery is a 23 y.o. female who complains of coryza, congestion, sore throat, productive cough, post tussive vomiting, and headache for 4 days. She denies a history of anorexia, chest pain, chills, dizziness, fatigue, fevers, myalgias, nausea, rash, shortness of breath, sweats, weakness, weight loss and wheezing and has a history of asthma. Patient denies smoke cigarettes.      Past Medical History  Diagnosis Date  . Asthma   . 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 Anxiety   . Cannabis dependence    History reviewed. No pertinent past surgical history. Family History  Problem Relation Age of Onset  . Sickle cell anemia Mother   . Cancer Mother   . Depression Mother   . Diabetes Other     grandparents and aunts/uncles  . Stroke Other     grandparent   History  Substance Use Topics  . Smoking status: Former Smoker -- 0.50 packs/day  . Smokeless tobacco: Not on file  . Alcohol Use: Yes     Comment: ocassionally   OB History   Grav Para Term Preterm Abortions TAB SAB Ect Mult Living   2 2 0 0 0 0 0 0 0 2      Review of Systems  Constitutional: Negative for fever and chills.  HENT: Positive for congestion (nasal), mouth sores (1) and sinus pressure. Negative for ear pain, facial swelling, rhinorrhea and sore throat.   Respiratory: Positive for cough. Negative for shortness of breath.   Cardiovascular: Negative for chest pain.  Gastrointestinal: Positive for nausea, vomiting and abdominal pain.  Negative for diarrhea.  Musculoskeletal: Negative for back pain and myalgias.  Skin: Negative for color change and rash.  Neurological: Negative for syncope and headaches.  Psychiatric/Behavioral: Negative for behavioral problems and confusion.  All other systems reviewed and are negative.    Allergies  Monistat; Banana; and Chocolate  Home Medications   Current Outpatient Rx  Name  Route  Sig  Dispense  Refill  . aspirin-sod bicarb-citric acid (ALKA-SELTZER) 325 MG TBEF tablet   Oral   Take 325 mg by mouth every 6 (six) hours as needed (flu-like symptoms).          Triage Vitals: BP 135/84  Pulse 60  Temp(Src) 98.7 F (37.1 C) (Oral)  Resp 20  SpO2 99%  LMP 03/30/2013  Physical Exam  Nursing note and vitals reviewed. Constitutional: She is oriented to person, place, and time. She appears well-developed and well-nourished. No distress.  HENT:  Head: Normocephalic and atraumatic.  Right Ear: Tympanic membrane, external ear and ear canal normal.  Left Ear: Tympanic membrane, external ear and ear canal normal.  Nose: Nose normal. No mucosal edema or rhinorrhea. Right sinus exhibits no frontal sinus tenderness. Left sinus exhibits no frontal sinus tenderness.  Mouth/Throat: Uvula is midline, oropharynx is clear and moist and mucous membranes are normal. No oropharyngeal exudate, posterior oropharyngeal edema or posterior oropharyngeal erythema.  Small vesicular eruption on the Right lower [place] border.  Eyes: Conjunctivae and EOM are normal. Right eye exhibits no discharge. Left eye exhibits no discharge.  Neck: Neck supple. No tracheal deviation present.  Cardiovascular: Normal rate, regular rhythm and normal heart sounds.  Exam reveals no gallop and no friction rub.   No murmur heard. Pulmonary/Chest: Effort normal. No respiratory distress. She has no decreased breath sounds. She has no wheezes. She has rhonchi in the right lower field. She has no rales.  Abdominal:  Soft.  Musculoskeletal: Normal range of motion.  Neurological: She is alert and oriented to person, place, and time.  Skin: Skin is warm and dry. No rash noted.  Psychiatric: She has a normal mood and affect. Her behavior is normal. Judgment and thought content normal.    ED Course  Procedures (including critical care time) DIAGNOSTIC STUDIES: Oxygen Saturation is 99% on RA, normal by my interpretation.    COORDINATION OF CARE: 12:53 PM-Will order chest x-ray.  Patient informed of current plan of evaluation and agrees with plan.    1:10 PM-Will discharge with Hycodan.  Pt informed of treatment and agrees with plan.     Labs Review Labs Reviewed  POCT PREGNANCY, URINE   Imaging Review Dg Chest 2 View  04/23/2013   CLINICAL DATA:  Chills.  Congestion.  Cough.  EXAM: CHEST  2 VIEW  COMPARISON:  11/28/2011.  05/26/2010.  04/27/2009.  FINDINGS: Heart size is slightly prominent with a somewhat globular configuration. This could be normal, but raises the question about a possible septal defect. The pulmonary vascularity is within normal limits. No effusions. No bony abnormalities.  IMPRESSION: No active cardiopulmonary finding. Globular heart configuration, possibly becoming more prominent over time. Does the patient have murmur or any clinical signs or symptoms of septal defect? This may be normal, but if there is any clinical concern, echocardiography could be useful.   Electronically Signed   By: Nelson Chimes M.D.   On: 04/23/2013 12:55   MDM   Final diagnoses:  URI (upper respiratory infection)  Cold sore   Patient appears to have a small developing herpetic outbreak on the lower lip.  Pt CXR negative for acute infiltrate. Radiology concerned for irregular heart shape. I have in-boxed the patient's pcp to follow up on the concern and i have reviewed the patient's findings.  Patient with  Patients symptoms are consistent with URI, likely viral etiology. Discussed that antibiotics are not  indicated for viral infections. Pt will be discharged with symptomatic treatment.  Verbalizes understanding and is agreeable with plan. Pt is hemodynamically stable & in NAD prior to dc.  I personally performed the services described in this documentation, which was scribed in my presence. The recorded information has been reviewed and is accurate.       Margarita Mail, PA-C 04/24/13 [phone]

## 2013-04-23 NOTE — Progress Notes (Signed)
[facility] provided pt with a [facility] orange card application to help patient establish primary care.

## 2013-04-23 NOTE — ED Notes (Signed)
Patient given urine cup and escorted to restroom.

## 2013-04-24 NOTE — ED Provider Notes (Signed)
Medical screening examination/treatment/procedure(s) were performed by non-physician practitioner and as supervising physician I was immediately available for consultation/collaboration.  EKG Interpretation   None         Revia Nghiem M Leathia Farnell, MD 04/24/13 1628 

## 2013-04-24 NOTE — ED Provider Notes (Deleted)
CSN: [id number]     Arrival date & time 04/23/13  1120 History   First MD Initiated Contact with Patient 04/23/13 1207     Chief Complaint  Patient presents with  . Nasal Congestion     (Consider location/radiation/quality/duration/timing/severity/associated sxs/prior Treatment) HPI  Grace Montgomery is a 23 y.o. female who complains of coryza, congestion, sore throat, nasal blockage, productive cough and headache for 4 days. She denies a history of anorexia, chest pain, chills, dizziness, fatigue, fevers, myalgias, nausea, rash, shortness of breath, sweats, vomiting, weakness and wheezing and admits to a history of asthma. Patient denies smoke cigarettes.      Past Medical History  Diagnosis Date  . Asthma   . 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 Anxiety   . Cannabis dependence    History reviewed. No pertinent past surgical history. Family History  Problem Relation Age of Onset  . Sickle cell anemia Mother   . Cancer Mother   . Depression Mother   . Diabetes Other     grandparents and aunts/uncles  . Stroke Other     grandparent   History  Substance Use Topics  . Smoking status: Former Smoker -- 0.50 packs/day  . Smokeless tobacco: Not on file  . Alcohol Use: Yes     Comment: ocassionally   OB History   Grav Para Term Preterm Abortions TAB SAB Ect Mult Living   2 2 0 0 0 0 0 0 0 2      Review of Systems As stated in HPI.   Allergies  Monistat; Banana; and Chocolate  Home Medications   Current Outpatient Rx  Name  Route  Sig  Dispense  Refill  . aspirin-sod bicarb-citric acid (ALKA-SELTZER) 325 MG TBEF tablet   Oral   Take 325 mg by mouth every 6 (six) hours as needed (flu-like symptoms).         Marland Kitchen HYDROcodone-homatropine (HYCODAN) 5-1.5 MG/5ML syrup   Oral   Take 5 mLs by mouth every 6 (six) hours as needed for cough.   120 mL   0    BP 120/75  Pulse 75  Temp(Src) 98.7 F (37.1 C) (Oral)  Resp 18  SpO2 100%  LMP  03/30/2013 Physical Exam Appears moderately ill but not toxic; temperature as noted in vitals. Ears normal. Eyes:glassy appearance, no discharge  Heart: RRR, NO M/G/R Throat and pharynx normal.   Neck supple. No adenopathyhy in the neck.  Sinuses non tender.  The chest is clear. Abdomen is soft and nontender  ED Course  Procedures (including critical care time) Labs Review Labs Reviewed  POCT PREGNANCY, URINE   Imaging Review Dg Chest 2 View  04/23/2013   CLINICAL DATA:  Chills.  Congestion.  Cough.  EXAM: CHEST  2 VIEW  COMPARISON:  11/28/2011.  05/26/2010.  04/27/2009.  FINDINGS: Heart size is slightly prominent with a somewhat globular configuration. This could be normal, but raises the question about a possible septal defect. The pulmonary vascularity is within normal limits. No effusions. No bony abnormalities.  IMPRESSION: No active cardiopulmonary finding. Globular heart configuration, possibly becoming more prominent over time. Does the patient have murmur or any clinical signs or symptoms of septal defect? This may be normal, but if there is any clinical concern, echocardiography could be useful.   Electronically Signed   By: Nelson Chimes M.D.   On: 04/23/2013 12:55    EKG Interpretation   None  MDM   Final diagnoses:  URI (upper respiratory infection)   Pt CXR negative for acute infiltrate.Concern by radiology for possible irregular heart shape. I have inboxed the patient's pcp to follow up on this concern with the patient and I have informed the patient. Patients symptoms are consistent with URI, likely viral etiology. Discussed that antibiotics are not indicated for viral infections. Pt will be discharged with symptomatic treatment.  Verbalizes understanding and is agreeable with plan. Pt is hemodynamically stable & in NAD prior to dc.   Margarita Mail, PA-C 04/24/13 0710

## 2013-05-07 ENCOUNTER — Ambulatory Visit: Payer: Self-pay | Admitting: Sports Medicine

## 2013-05-21 ENCOUNTER — Ambulatory Visit: Payer: Self-pay | Admitting: Sports Medicine

## 2014-01-06 ENCOUNTER — Encounter (HOSPITAL_COMMUNITY): Payer: Self-pay | Admitting: Emergency Medicine

## 2014-01-06 ENCOUNTER — Emergency Department (HOSPITAL_COMMUNITY)
Admission: EM | Admit: 2014-01-06 | Discharge: 2014-01-06 | Disposition: A | Discharge status: Home / Self Care | Payer: Medicaid Other | Attending: Emergency Medicine | Admitting: Emergency Medicine

## 2014-01-06 DIAGNOSIS — A5901 Trichomonal vulvovaginitis: Secondary | ICD-10-CM

## 2014-01-06 DIAGNOSIS — B373 Candidiasis of vulva and vagina: Secondary | ICD-10-CM | POA: Insufficient documentation

## 2014-01-06 DIAGNOSIS — Z3202 Encounter for pregnancy test, result negative: Secondary | ICD-10-CM | POA: Insufficient documentation

## 2014-01-06 DIAGNOSIS — B3731 Acute candidiasis of vulva and vagina: Secondary | ICD-10-CM

## 2014-01-06 DIAGNOSIS — J45909 Unspecified asthma, uncomplicated: Secondary | ICD-10-CM | POA: Insufficient documentation

## 2014-01-06 DIAGNOSIS — Z87891 Personal history of nicotine dependence: Secondary | ICD-10-CM | POA: Insufficient documentation

## 2014-01-06 DIAGNOSIS — N72 Inflammatory disease of cervix uteri: Secondary | ICD-10-CM | POA: Insufficient documentation

## 2014-01-06 DIAGNOSIS — Z8659 Personal history of other mental and behavioral disorders: Secondary | ICD-10-CM | POA: Insufficient documentation

## 2014-01-06 LAB — URINALYSIS, ROUTINE W REFLEX MICROSCOPIC
BILIRUBIN URINE: NEGATIVE
Glucose, UA: NEGATIVE mg/dL
HGB URINE DIPSTICK: NEGATIVE
KETONES UR: NEGATIVE mg/dL
Nitrite: NEGATIVE
PROTEIN: NEGATIVE mg/dL
Specific Gravity, Urine: 1.025 (ref 1.005–1.030)
Urobilinogen, UA: 0.2 mg/dL (ref 0.0–1.0)
pH: 6 (ref 5.0–8.0)

## 2014-01-06 LAB — URINE MICROSCOPIC-ADD ON

## 2014-01-06 LAB — WET PREP, GENITAL: YEAST WET PREP: NONE SEEN

## 2014-01-06 LAB — PREGNANCY, URINE: PREG TEST UR: NEGATIVE

## 2014-01-06 MED ORDER — METRONIDAZOLE 500 MG PO TABS
2000.0000 mg | ORAL_TABLET | Freq: Once | ORAL | Status: AC
  Administered 2014-01-06: 2000 mg via ORAL
  Filled 2014-01-06: qty 4

## 2014-01-06 MED ORDER — FLUCONAZOLE 100 MG PO TABS
150.0000 mg | ORAL_TABLET | Freq: Every day | ORAL | Status: DC
Start: 2014-01-06 — End: 2014-01-06
  Administered 2014-01-06: 150 mg via ORAL
  Filled 2014-01-06: qty 2

## 2014-01-06 MED ORDER — CEFTRIAXONE SODIUM 250 MG IJ SOLR
250.0000 mg | Freq: Once | INTRAMUSCULAR | Status: AC
  Administered 2014-01-06: 250 mg via INTRAMUSCULAR
  Filled 2014-01-06: qty 250

## 2014-01-06 MED ORDER — LIDOCAINE HCL (PF) 1 % IJ SOLN
INTRAMUSCULAR | Status: AC
Start: 2014-01-06 — End: 2014-01-06
  Administered 2014-01-06: 5 mL
  Filled 2014-01-06: qty 5

## 2014-01-06 MED ORDER — AZITHROMYCIN 250 MG PO TABS
1000.0000 mg | ORAL_TABLET | Freq: Once | ORAL | Status: AC
  Administered 2014-01-06: 1000 mg via ORAL
  Filled 2014-01-06: qty 4

## 2014-01-06 NOTE — ED Notes (Signed)
Dr. Linker at bedside  

## 2014-01-06 NOTE — ED Notes (Signed)
Pelvic cart set up at bedside  

## 2014-01-06 NOTE — ED Provider Notes (Signed)
CSN: [id number]     Arrival date & time 01/06/14  0930 History   First MD Initiated Contact with Patient 01/06/14 1225     Chief Complaint  Patient presents with  . Vaginal Pain     (Consider location/radiation/quality/duration/timing/severity/associated sxs/prior Treatment) HPI  Pt presents with c/o vaginal itching.  She states the symptoms began 3 days ago.  She tried soaking in the bathtub but since then the symptoms have worsened.  No abdominal pain.  She has had some yellowish vaginal discharge.  No vomiting or fevers.  LMP was 10/6.  She denies having had a prior STD.  Has had unprotected sex recently.  There are no other associated systemic symptoms, there are no other alleviating or modifying factors.   Past Medical History  Diagnosis Date  . Asthma   . 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 Anxiety   . Cannabis dependence    History reviewed. No pertinent past surgical history. Family History  Problem Relation Age of Onset  . Sickle cell anemia Mother   . Cancer Mother   . Depression Mother   . Diabetes Other     grandparents and aunts/uncles  . Stroke Other     grandparent   History  Substance Use Topics  . Smoking status: Former Smoker -- 0.50 packs/day  . Smokeless tobacco: Not on file  . Alcohol Use: Yes     Comment: ocassionally   OB History    Gravida Para Term Preterm AB TAB SAB Ectopic Multiple Living   2 2 0 0 0 0 0 0 0 2      Review of Systems  ROS reviewed and all otherwise negative except for mentioned in HPI    Allergies  Monistat; Banana; and Chocolate  Home Medications   Prior to Admission medications   Medication Sig Start Date End Date Taking? Authorizing Provider  aspirin-sod bicarb-citric acid (ALKA-SELTZER) 325 MG TBEF tablet Take 325 mg by mouth every 6 (six) hours as needed (flu-like symptoms).    Historical Provider, MD  HYDROcodone-homatropine (HYCODAN) 5-1.5 MG/5ML syrup Take 5 mLs by mouth every 6  (six) hours as needed for cough. 04/23/13   Abigail Harris, PA-C   BP 117/62 mmHg  Pulse 63  Temp(Src) 98.4 F (36.9 C) (Oral)  Resp 16  Ht 5\' 3"  (1.6 m)  Wt 248 lb 9 oz (112.747 kg)  BMI 44.04 kg/m2  SpO2 100%  LMP 12/11/2013  Vitals reviewed Physical Exam  Physical Examination: General appearance - alert, well appearing, and in no distress Mental status - alert, oriented to person, place, and time Eyes - no conjunctival injection, no scleral icterus Chest - clear to auscultation, no wheezes, rales or rhonchi, symmetric air entry Heart - normal rate, regular rhythm, normal S1, S2, no murmurs, rubs, clicks or gallops Abdomen - soft, nontender, nondistended, no masses or organomegaly Pelvic - normal external genitalia, vulva, cervix, uterus and adnexa, yellow and thick discharge in vaginal vault, some adherence to vaginal walls, no CMT, no adnexal tenderness Extremities - peripheral pulses normal, no pedal edema, no clubbing or cyanosis Skin - normal coloration and turgor, no rashes  ED Course  Procedures (including critical care time) Labs Review Labs Reviewed  WET PREP, GENITAL - Abnormal; Notable for the following:    Trich, Wet Prep MODERATE (*)    Clue Cells Wet Prep HPF POC FEW (*)    WBC, Wet Prep HPF POC TOO NUMEROUS TO COUNT (*)  All other components within normal limits  URINALYSIS, ROUTINE W REFLEX MICROSCOPIC - Abnormal; Notable for the following:    Leukocytes, UA SMALL (*)    All other components within normal limits  URINE MICROSCOPIC-ADD ON - Abnormal; Notable for the following:    Squamous Epithelial / LPF FEW (*)    Bacteria, UA FEW (*)    All other components within normal limits  GC/CHLAMYDIA PROBE AMP  URINE CULTURE  PREGNANCY, URINE    Imaging Review No results found.   EKG Interpretation None      MDM   Final diagnoses:  Yeast vaginitis  Trichomonal vaginitis  Cervicitis    Pt presenting with vaginal itching and vaginal discharge,  clinically symptoms c/w yeast vaginitis, wet prep shows trich, many WBCs- pt treated for cervicitis, trichomonas and yeast infection.  No signs or symptoms of PID.  Discharged with strict return precautions.  Pt agreeable with plan.    Threasa Beards, MD 01/06/14 [phone]

## 2014-01-06 NOTE — Discharge Instructions (Signed)
Return to the ED with any concerns including abdominal pain, fever/chills, vomiting and not able to keep down liquids, decreased level of alertness/lethargy, or any other alarming symptoms °

## 2014-01-06 NOTE — ED Notes (Signed)
Pt. Stated, Grace Montgomery had vaginal pain and itching since Thursday night. I had unprotected sex Thursday and Friday. I took a bubble bath and soaked in bathtub.

## 2014-01-07 ENCOUNTER — Encounter (HOSPITAL_COMMUNITY): Payer: Self-pay | Admitting: Emergency Medicine

## 2014-01-08 LAB — GC/CHLAMYDIA PROBE AMP
CT PROBE, AMP APTIMA: NEGATIVE
GC PROBE AMP APTIMA: NEGATIVE

## 2014-01-08 LAB — URINE CULTURE

## 2014-02-23 ENCOUNTER — Emergency Department (HOSPITAL_COMMUNITY): Payer: Medicaid Other

## 2014-02-23 ENCOUNTER — Encounter (HOSPITAL_COMMUNITY): Payer: Self-pay | Admitting: Emergency Medicine

## 2014-02-23 ENCOUNTER — Emergency Department (HOSPITAL_COMMUNITY)
Admission: EM | Admit: 2014-02-23 | Discharge: 2014-02-23 | Disposition: A | Discharge status: Home / Self Care | Payer: Self-pay | Attending: Emergency Medicine | Admitting: Emergency Medicine

## 2014-02-23 DIAGNOSIS — Z8659 Personal history of other mental and behavioral disorders: Secondary | ICD-10-CM | POA: Insufficient documentation

## 2014-02-23 DIAGNOSIS — J45901 Unspecified asthma with (acute) exacerbation: Secondary | ICD-10-CM | POA: Insufficient documentation

## 2014-02-23 DIAGNOSIS — Z7982 Long term (current) use of aspirin: Secondary | ICD-10-CM | POA: Insufficient documentation

## 2014-02-23 DIAGNOSIS — R112 Nausea with vomiting, unspecified: Secondary | ICD-10-CM | POA: Insufficient documentation

## 2014-02-23 DIAGNOSIS — Z87891 Personal history of nicotine dependence: Secondary | ICD-10-CM | POA: Insufficient documentation

## 2014-02-23 DIAGNOSIS — Z3202 Encounter for pregnancy test, result negative: Secondary | ICD-10-CM | POA: Insufficient documentation

## 2014-02-23 LAB — CBC WITH DIFFERENTIAL/PLATELET
Basophils Absolute: 0 10*3/uL (ref 0.0–0.1)
Basophils Relative: 0 % (ref 0–1)
Eosinophils Absolute: 0.3 10*3/uL (ref 0.0–0.7)
Eosinophils Relative: 4 % (ref 0–5)
HCT: 33.9 % — ABNORMAL LOW (ref 36.0–46.0)
Hemoglobin: 10.9 g/dL — ABNORMAL LOW (ref 12.0–15.0)
Lymphocytes Relative: 34 % (ref 12–46)
Lymphs Abs: 2.4 10*3/uL (ref 0.7–4.0)
MCH: 26.5 pg (ref 26.0–34.0)
MCHC: 32.2 g/dL (ref 30.0–36.0)
MCV: 82.5 fL (ref 78.0–100.0)
Monocytes Absolute: 0.4 10*3/uL (ref 0.1–1.0)
Monocytes Relative: 6 % (ref 3–12)
Neutro Abs: 3.9 10*3/uL (ref 1.7–7.7)
Neutrophils Relative %: 56 % (ref 43–77)
Platelets: 222 10*3/uL (ref 150–400)
RBC: 4.11 MIL/uL (ref 3.87–5.11)
RDW: 16.5 % — ABNORMAL HIGH (ref 11.5–15.5)
WBC: 7.1 10*3/uL (ref 4.0–10.5)

## 2014-02-23 LAB — BASIC METABOLIC PANEL
Anion gap: 15 (ref 5–15)
BUN: 8 mg/dL (ref 6–23)
CO2: 22 mEq/L (ref 19–32)
Calcium: 8.7 mg/dL (ref 8.4–10.5)
Chloride: 103 mEq/L (ref 96–112)
Creatinine, Ser: 0.8 mg/dL (ref 0.50–1.10)
GFR calc Af Amer: >90 mL/min (ref >90)
GFR calc non Af Amer: >90 mL/min (ref >90)
Glucose, Bld: 149 mg/dL — ABNORMAL HIGH (ref 70–99)
Potassium: 5.3 mEq/L (ref 3.7–5.3)
Sodium: 140 mEq/L (ref 137–147)

## 2014-02-23 LAB — RAPID STREP SCREEN (MED CTR MEBANE ONLY): Streptococcus, Group A Screen (Direct): NEGATIVE

## 2014-02-23 LAB — POC URINE PREG, ED: Preg Test, Ur: NEGATIVE

## 2014-02-23 MED ORDER — IPRATROPIUM-ALBUTEROL 0.5-2.5 (3) MG/3ML IN SOLN
3.0000 mL | Freq: Once | RESPIRATORY_TRACT | Status: DC
  Filled 2014-02-23: qty 3

## 2014-02-23 MED ORDER — SODIUM CHLORIDE 0.9 % IV BOLUS (SEPSIS)
1000.0000 mL | Freq: Once | INTRAVENOUS | Status: AC
  Administered 2014-02-23: 1000 mL via INTRAVENOUS

## 2014-02-23 MED ORDER — PROMETHAZINE HCL 25 MG PO TABS: 25.0000 mg | ORAL_TABLET | Freq: Four times a day (QID) | ORAL | Status: DC | PRN

## 2014-02-23 MED ORDER — ONDANSETRON HCL 4 MG/2ML IJ SOLN
4.0000 mg | Freq: Once | INTRAMUSCULAR | Status: AC
  Administered 2014-02-23: 4 mg via INTRAVENOUS
  Filled 2014-02-23: qty 2

## 2014-02-23 MED ORDER — ONDANSETRON 4 MG PO TBDP
4.0000 mg | ORAL_TABLET | Freq: Once | ORAL | Status: AC
  Administered 2014-02-23: 4 mg via ORAL
  Filled 2014-02-23: qty 1

## 2014-02-23 MED ORDER — IPRATROPIUM-ALBUTEROL 0.5-2.5 (3) MG/3ML IN SOLN: 3.0000 mL | Freq: Once | RESPIRATORY_TRACT | Status: DC

## 2014-02-23 MED ORDER — IPRATROPIUM-ALBUTEROL 0.5-2.5 (3) MG/3ML IN SOLN
3.0000 mL | Freq: Once | RESPIRATORY_TRACT | Status: AC
  Administered 2014-02-23: 3 mL via RESPIRATORY_TRACT

## 2014-02-23 MED ORDER — ALBUTEROL SULFATE HFA 108 (90 BASE) MCG/ACT IN AERS: 2.0000 | INHALATION_SPRAY | RESPIRATORY_TRACT | Status: DC | PRN

## 2014-02-23 MED ORDER — PREDNISONE 20 MG PO TABS
ORAL_TABLET | ORAL | Status: DC
Start: 2014-02-23 — End: 2014-05-14

## 2014-02-23 MED ORDER — PREDNISONE 20 MG PO TABS
60.0000 mg | ORAL_TABLET | Freq: Once | ORAL | Status: AC
  Administered 2014-02-23: 60 mg via ORAL
  Filled 2014-02-23: qty 3

## 2014-02-23 NOTE — ED Notes (Signed)
Pt. Stated, i started vomiting this morning and I think Im dehydrated.  i also have asthma.  My throat hurts to.

## 2014-02-23 NOTE — ED Notes (Signed)
To X-ray

## 2014-02-23 NOTE — ED Notes (Signed)
Pt vomited large amount of [place], pt NPO again

## 2014-02-23 NOTE — Discharge Instructions (Signed)

## 2014-02-23 NOTE — ED Notes (Signed)
Pt vomiting forcefully after strep culture.

## 2014-02-23 NOTE — ED Provider Notes (Signed)
CSN: [id number]     Arrival date & time 02/23/14  1011 History   First MD Initiated Contact with Patient 02/23/14 1024     Chief Complaint  Patient presents with  . Emesis  . Dizziness  . Sore Throat  . Wheezing     (Consider location/radiation/quality/duration/timing/severity/associated sxs/prior Treatment) HPI  Pt is an obese female with hx of asthma, anxiety, major depressive disorder, and cannabis dependence, presenting to ED with c/o vomiting that woke pt from her sleep around 4AM this morning. States once it started it was "constant" she does not recall number of episodes of emesis but believes it was over 10 times. Denies blood in emesis. Reports associated SOB with chest tightness and wheeze as well as sore throat.  Symptoms all started this morning but she does report a coworker "coughing over everyone" yesterday.  States she does not have an inhaler at home as her asthma only gets bad when she is sick, which has not been for about 4 years.  Pt tried to eat something this morning after vomiting as she felt lightheaded and thought she was becoming dehydrated. Pt was unable to keep down food or fluids. No medication tried PTA.  Denies fever or chills. Denies diarrhea, urinary or vaginal symptoms. Denies recent travel. LMP: 01/06/14, pt reports having Implanon in place.  Past Medical History  Diagnosis Date  . Asthma   . 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 Anxiety   . Cannabis dependence    History reviewed. No pertinent past surgical history. Family History  Problem Relation Age of Onset  . Sickle cell anemia Mother   . Cancer Mother   . Depression Mother   . Diabetes Other     grandparents and aunts/uncles  . Stroke Other     grandparent   History  Substance Use Topics  . Smoking status: Former Smoker -- 0.50 packs/day  . Smokeless tobacco: Not on file  . Alcohol Use: Yes     Comment: ocassionally   OB History    Gravida Para Term Preterm  AB TAB SAB Ectopic Multiple Living   2 2 0 0 0 0 0 0 0 2      Review of Systems  Constitutional: Positive for appetite change and fatigue. Negative for fever and chills.  HENT: Positive for sore throat. Negative for congestion, rhinorrhea, sinus pressure, trouble swallowing and voice change.   Respiratory: Positive for chest tightness, shortness of breath and wheezing. Negative for cough.   Cardiovascular: Positive for chest pain ( "chest tightness"). Negative for palpitations and leg swelling.  Gastrointestinal: Positive for nausea and vomiting. Negative for abdominal pain, diarrhea and constipation.  Neurological: Positive for weakness and light-headedness. Negative for dizziness and headaches.  All other systems reviewed and are negative.     Allergies  Monistat; Banana; and Chocolate  Home Medications   Prior to Admission medications   Medication Sig Start Date End Date Taking? Authorizing Provider  aspirin-sod bicarb-citric acid (ALKA-SELTZER) 325 MG TBEF tablet Take 325 mg by mouth every 6 (six) hours as needed (flu-like symptoms).    Historical Provider, MD  HYDROcodone-homatropine (HYCODAN) 5-1.5 MG/5ML syrup Take 5 mLs by mouth every 6 (six) hours as needed for cough. 04/23/13   Margarita Mail, PA-C  predniSONE (DELTASONE) 20 MG tablet 2 tabs po daily x 3 days 02/23/14   Noland Fordyce, PA-C  promethazine (PHENERGAN) 25 MG tablet Take 1 tablet (25 mg total) by mouth every 6 (six)  hours as needed for nausea or vomiting. 02/23/14   Noland Fordyce, PA-C   BP 104/70 mmHg  Pulse 106  Temp(Src) 99.1 F (37.3 C) (Oral)  Resp 16  Ht 5\' 2"  (1.575 m)  Wt 224 lb (101.606 kg)  BMI 40.96 kg/m2  SpO2 100%  LMP 02/05/2014 Physical Exam  Constitutional: She appears well-developed and well-nourished. No distress.  Obese female lying in exam bed, actively gagging, spitting up clear mucous.  HENT:  Head: Normocephalic and atraumatic.  Right Ear: Hearing, tympanic membrane, external ear  and ear canal normal.  Left Ear: Hearing, tympanic membrane, external ear and ear canal normal.  Nose: Mucosal edema present.  Mouth/Throat: Uvula is midline and mucous membranes are normal. Posterior oropharyngeal edema and posterior oropharyngeal erythema present. No oropharyngeal exudate.  Eyes: Conjunctivae are normal. No scleral icterus.  Neck: Normal range of motion.  Cardiovascular: Normal rate, regular rhythm and normal heart sounds.   Pulmonary/Chest: Effort normal. No respiratory distress. She has wheezes in the right upper field, the right middle field, the left upper field and the left middle field. She has no rhonchi. She has no rales. She exhibits no tenderness.  Abdominal: Soft. Bowel sounds are normal. She exhibits no distension and no mass. There is no tenderness. There is no rebound and no guarding.  Soft, non-distended, non-tender. No CVAT  Musculoskeletal: Normal range of motion.  Neurological: She is alert.  Skin: Skin is warm and dry. She is not diaphoretic.  Nursing note and vitals reviewed.   ED Course  Procedures (including critical care time) Labs Review Labs Reviewed  CBC WITH DIFFERENTIAL - Abnormal; Notable for the following:    Hemoglobin 10.9 (*)    HCT 33.9 (*)    RDW 16.5 (*)    All other components within normal limits  BASIC METABOLIC PANEL - Abnormal; Notable for the following:    Glucose, Bld 149 (*)    All other components within normal limits  RAPID STREP SCREEN  CULTURE, GROUP A STREP  POC URINE PREG, ED    Imaging Review Dg Chest 2 View  02/23/2014   CLINICAL DATA:  Wheezing, cough since yesterday, sore throat  EXAM: CHEST  2 VIEW  COMPARISON:  None.  FINDINGS: Bilateral diffuse interstitial thickening and peribronchial cuffing most concerning for bronchitis. There is no focal parenchymal opacity, pleural effusion, or pneumothorax. The heart and mediastinal contours are unremarkable. The osseous structures are unremarkable.  IMPRESSION:  Bilateral diffuse interstitial thickening and peribronchial cuffing most concerning for bronchitis.   Electronically Signed   By: Kathreen Devoid   On: 02/23/2014 12:35     EKG Interpretation None      MDM   Final diagnoses:  Asthma exacerbation  Non-intractable vomiting with nausea, vomiting of unspecified type    Pt is a 24yo female with hx of asthma, presenting to ED with c/o SOB, nausea and vomiting that started around 4AM this morning.  On exam, pt actively gagging up phlegm.  No respiratory distress, however, Oropharynx: tonsillar erythema and edema w/o exudates. No tonsillar abscess. Lungs: wheeze in upper lung fields.  Abdominal exam: benign. Not concerned for surgical abdomen. DDx: pneumonia, asthma exacerbation, pneumothorax, strep pharyngitis, gastritis, pregnancy, doubt SBO, cholecystitis, ectopic pregnancy or appendicitis.   CXR: bilateral diffuse interstitial thickening and peribronchial cuffing concerning for bronchitis.  Pt given, zofran, IV fluids, prednisone and duoneb.  12:44 PM Discussed CXR with pt. Pt states nausea has improved and requesting water and crackers. Duoneb has not been completed  as pt was in radiology department.  Will give pt PO fluid challenge and reevaluate lung sounds after nebulizer tx.  Lungs: CTAB, pt states she feels much better and would like to be discharged home. Encouraged pt to take small sips of PO fluids to prevent nausea and vomiting.  Will discharge pt home with prednisone x3 days and phenergan. Pt given albuterol inhaler in ED. Home care instructions provided.  Return precautions provided. Advised to f/u with PCP in 2-3 days if not improving. Pt verbalized understanding and agreement with tx plan.    Noland Fordyce, PA-C 02/23/14 [place], MD 02/24/14 [phone]

## 2014-02-25 LAB — CULTURE, GROUP A STREP

## 2014-05-14 ENCOUNTER — Encounter (HOSPITAL_COMMUNITY): Payer: Self-pay

## 2014-05-14 ENCOUNTER — Emergency Department (HOSPITAL_COMMUNITY)
Admission: EM | Admit: 2014-05-14 | Discharge: 2014-05-14 | Disposition: A | Discharge status: Home / Self Care | Payer: Medicaid Other | Attending: Emergency Medicine | Admitting: Emergency Medicine

## 2014-05-14 ENCOUNTER — Emergency Department (HOSPITAL_COMMUNITY): Payer: Medicaid Other

## 2014-05-14 DIAGNOSIS — R0602 Shortness of breath: Secondary | ICD-10-CM

## 2014-05-14 DIAGNOSIS — J069 Acute upper respiratory infection, unspecified: Secondary | ICD-10-CM | POA: Insufficient documentation

## 2014-05-14 DIAGNOSIS — Z8659 Personal history of other mental and behavioral disorders: Secondary | ICD-10-CM | POA: Insufficient documentation

## 2014-05-14 DIAGNOSIS — Z87891 Personal history of nicotine dependence: Secondary | ICD-10-CM | POA: Insufficient documentation

## 2014-05-14 DIAGNOSIS — J45901 Unspecified asthma with (acute) exacerbation: Secondary | ICD-10-CM | POA: Insufficient documentation

## 2014-05-14 MED ORDER — PREDNISONE 20 MG PO TABS: 60.0000 mg | ORAL_TABLET | Freq: Every day | ORAL | Status: DC

## 2014-05-14 MED ORDER — ALBUTEROL SULFATE (2.5 MG/3ML) 0.083% IN NEBU
5.0000 mg | INHALATION_SOLUTION | Freq: Once | RESPIRATORY_TRACT | Status: AC
  Administered 2014-05-14: 5 mg via RESPIRATORY_TRACT
  Filled 2014-05-14: qty 6

## 2014-05-14 MED ORDER — ALBUTEROL SULFATE HFA 108 (90 BASE) MCG/ACT IN AERS
2.0000 | INHALATION_SPRAY | RESPIRATORY_TRACT | Status: DC | PRN
  Administered 2014-05-14: 2 via RESPIRATORY_TRACT
  Filled 2014-05-14: qty 6.7

## 2014-05-14 MED ORDER — PREDNISONE 20 MG PO TABS
60.0000 mg | ORAL_TABLET | Freq: Once | ORAL | Status: AC
  Administered 2014-05-14: 60 mg via ORAL
  Filled 2014-05-14: qty 3

## 2014-05-14 MED ORDER — ALBUTEROL SULFATE (2.5 MG/3ML) 0.083% IN NEBU
INHALATION_SOLUTION | RESPIRATORY_TRACT | Status: AC
  Filled 2014-05-14: qty 6

## 2014-05-14 MED ORDER — IPRATROPIUM BROMIDE 0.02 % IN SOLN
0.5000 mg | Freq: Once | RESPIRATORY_TRACT | Status: AC
  Administered 2014-05-14: 0.5 mg via RESPIRATORY_TRACT
  Filled 2014-05-14: qty 2.5

## 2014-05-14 MED ORDER — ALBUTEROL SULFATE (2.5 MG/3ML) 0.083% IN NEBU
5.0000 mg | INHALATION_SOLUTION | Freq: Once | RESPIRATORY_TRACT | Status: AC
  Administered 2014-05-14: 5 mg via RESPIRATORY_TRACT

## 2014-05-14 MED ORDER — BENZONATATE 100 MG PO CAPS: 100.0000 mg | ORAL_CAPSULE | Freq: Three times a day (TID) | ORAL | Status: DC

## 2014-05-14 NOTE — Discharge Instructions (Signed)
Read the instructions below on reasons to return to the emergency department and to learn more about your diagnosis.  Use over the counter medications for symptomatic relief as we discussed (mucinex as a decongestant, Tylenol for fever/pain, Motrin/Ibuprofen for muscle aches). Followup with your primary care doctor in 4 days if your symptoms persist.  Your more than welcome to return to the emergency department if symptoms worsen or become concerning.  Upper Respiratory Infection, Adult  An upper respiratory infection (URI) is also sometimes known as the common cold. Most people improve within 1 week, but symptoms can last up to 2 weeks. A residual cough may last even longer.   URI is most commonly caused by a virus. Viruses are NOT treated with antibiotics. You can easily spread the virus to others by oral contact. This includes kissing, sharing a glass, coughing, or sneezing. Touching your mouth or nose and then touching a surface, which is then touched by another person, can also spread the virus.   TREATMENT  Treatment is directed at relieving symptoms. There is no cure. Antibiotics are not effective, because the infection is caused by a virus, not by bacteria. Treatment may include:  Increased fluid intake. Sports drinks offer valuable electrolytes, sugars, and fluids.  Breathing heated mist or steam (vaporizer or shower).  Eating chicken soup or other clear broths, and maintaining good nutrition.  Getting plenty of rest.  Using gargles or lozenges for comfort.  Controlling fevers with ibuprofen or acetaminophen as directed by your caregiver.  Increasing usage of your inhaler if you have asthma.  Return to work when your temperature has returned to normal.   SEEK MEDICAL CARE IF:  After the first few days, you feel you are getting worse rather than better.  You develop worsening shortness of breath, or brown or red sputum. These may be signs of pneumonia.  You develop yellow or brown nasal  discharge or pain in the face, especially when you bend forward. These may be signs of sinusitis.  You develop a fever, swollen neck glands, pain with swallowing, or white areas in the back of your throat. These may be signs of strep throat.

## 2014-05-14 NOTE — ED Notes (Signed)
Pt has asthma and has been feeling SOB since Saturday. Has audible wheezing. Has been having abd pain with N/V for the past 4 days also.

## 2014-05-14 NOTE — ED Provider Notes (Signed)
CSN: [id number]     Arrival date & time 05/14/14  1516 History   First MD Initiated Contact with Patient 05/14/14 1712     Chief Complaint  Patient presents with  . Asthma     (Consider location/radiation/quality/duration/timing/severity/associated sxs/prior Treatment) HPI Comments: Patient with a history of Asthma presents today with complaints of wheezing, SOB, and productive cough.  She reports that symptoms have been present for the past 4 days.  She states that she did not have an inhaler at home.  She reports associated vomiting as noted in the Triage RN's note.  However, when she describes the vomiting she states that she is "throwing up chest mucous when coughing really hard. "  She states that she has been able to keep down food and fluids.  She denies any abdominal pain and states that she never had abdominal pain with this.  She denies diarrhea, fever, chills, chest pain, or hemoptysis.   She does report associated post nasal drip.  She states that she has been taking Theraflu for her symptoms with mild improvement.  She also states that she was given a breathing treatment in Triage, which she reports helped with the SOB.  Patient is a 24 y.o. female presenting with asthma. The history is provided by the patient.  Asthma    Past Medical History  Diagnosis Date  . Asthma   . 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 Anxiety   . Cannabis dependence    History reviewed. No pertinent past surgical history. Family History  Problem Relation Age of Onset  . Sickle cell anemia Mother   . Cancer Mother   . Depression Mother   . Diabetes Other     grandparents and aunts/uncles  . Stroke Other     grandparent   History  Substance Use Topics  . Smoking status: Former Smoker -- 0.50 packs/day  . Smokeless tobacco: Not on file  . Alcohol Use: Yes     Comment: ocassionally   OB History    Gravida Para Term Preterm AB TAB SAB Ectopic Multiple Living   2 2  0 0 0 0 0 0 0 2     Review of Systems  All other systems reviewed and are negative.     Allergies  Monistat; Banana; and Chocolate  Home Medications   Prior to Admission medications   Medication Sig Start Date End Date Taking? Authorizing Provider  Chlorphen-Pseudoephed-APAP (THERAFLU FLU/COLD PO) Take 30 mLs by mouth daily as needed (cough / cold).   Yes Historical Provider, MD  HYDROcodone-homatropine (HYCODAN) 5-1.5 MG/5ML syrup Take 5 mLs by mouth every 6 (six) hours as needed for cough. Patient not taking: Reported on 05/14/2014 04/23/13   Margarita Mail, PA-C  predniSONE (DELTASONE) 20 MG tablet 2 tabs po daily x 3 days Patient not taking: Reported on 05/14/2014 02/23/14   Noland Fordyce, PA-C  promethazine (PHENERGAN) 25 MG tablet Take 1 tablet (25 mg total) by mouth every 6 (six) hours as needed for nausea or vomiting. Patient not taking: Reported on 05/14/2014 02/23/14   Noland Fordyce, PA-C   BP 115/67 mmHg  Pulse 86  Temp(Src) 98.7 F (37.1 C) (Oral)  Resp 18  SpO2 99%  LMP 05/03/2014 Physical Exam  Constitutional: She appears well-developed and well-nourished.  HENT:  Head: Normocephalic and atraumatic.  Mouth/Throat: Oropharynx is clear and moist.  Neck: Normal range of motion. Neck supple.  Cardiovascular: Normal rate, regular rhythm and normal heart  sounds.   Pulmonary/Chest: Effort normal. She has wheezes.  Mild diffuse expiratory wheezing Patient able to talk in complete sentences  Abdominal: Soft. Bowel sounds are normal. She exhibits no distension and no mass. There is no tenderness. There is no rebound and no guarding.  Musculoskeletal: Normal range of motion.  No LE edema bilaterally  Neurological: She is alert.  Skin: Skin is warm and dry.  Psychiatric: She has a normal mood and affect.  Nursing note and vitals reviewed.   ED Course  Procedures (including critical care time) Labs Review Labs Reviewed - No data to display  Imaging Review Dg Chest 2  View  05/14/2014   CLINICAL DATA:  Short of breath and wheezing for 3 days.  EXAM: CHEST  2 VIEW  COMPARISON:  02/23/2014  FINDINGS: Lateral view degraded by patient arm position. Midline trachea. Heart size upper normal. Suggestion of enlargement of the pulmonary outflow tract. No pleural effusion or pneumothorax. Similar interstitial prominence. No lobar consolidation.  IMPRESSION: Chronic interstitial prominence. Possibly related to bronchitis/ asthma or remote history of smoking.  Borderline cardiomegaly with suggestion of pulmonary outflow tract prominence. present over prior exams. Depending on clinical concern, nonemergent echocardiography could be performed to exclude pulmonary arterial hypertension.   Electronically Signed   By: Abigail Miyamoto M.D.   On: 05/14/2014 18:21     EKG Interpretation None     7:07 PM Reassessed patient.  Lungs CTAB.  She denies any SOB at this time. MDM   Final diagnoses:  SOB (shortness of breath)   Patient with a history of Asthma presents today with wheezing, SOB, and productive cough.  SOB improved after given two breathing treatments and Prednisone.  CXR results as above showing no acute findings.  Patient informed of the CXR results and instructed to follow up with her PCP.  She is [facility] at [facility].  VSS.  Pulse ox 99-100 on RA.  Symptoms most consistent with URI and Asthma exacerbation.  Feel that the patient is stable for discharge.  Return precautions given.      Hyman Bible, PA-C 05/14/14 [place], MD 05/14/14 2204

## 2014-09-11 ENCOUNTER — Ambulatory Visit: Payer: Self-pay | Admitting: Family Medicine

## 2015-04-22 ENCOUNTER — Emergency Department (HOSPITAL_COMMUNITY)
Admission: EM | Admit: 2015-04-22 | Discharge: 2015-04-22 | Disposition: A | Discharge status: Home / Self Care | Payer: Self-pay | Attending: Emergency Medicine | Admitting: Emergency Medicine

## 2015-04-22 ENCOUNTER — Encounter (HOSPITAL_COMMUNITY): Payer: Self-pay | Admitting: Emergency Medicine

## 2015-04-22 ENCOUNTER — Emergency Department (HOSPITAL_COMMUNITY): Payer: Self-pay

## 2015-04-22 DIAGNOSIS — Z79899 Other long term (current) drug therapy: Secondary | ICD-10-CM | POA: Insufficient documentation

## 2015-04-22 DIAGNOSIS — F17210 Nicotine dependence, cigarettes, uncomplicated: Secondary | ICD-10-CM | POA: Insufficient documentation

## 2015-04-22 DIAGNOSIS — J45901 Unspecified asthma with (acute) exacerbation: Secondary | ICD-10-CM | POA: Insufficient documentation

## 2015-04-22 DIAGNOSIS — F419 Anxiety disorder, unspecified: Secondary | ICD-10-CM | POA: Insufficient documentation

## 2015-04-22 DIAGNOSIS — J069 Acute upper respiratory infection, unspecified: Secondary | ICD-10-CM | POA: Insufficient documentation

## 2015-04-22 DIAGNOSIS — B9789 Other viral agents as the cause of diseases classified elsewhere: Secondary | ICD-10-CM

## 2015-04-22 LAB — CBG MONITORING, ED: GLUCOSE-CAPILLARY: 120 mg/dL — AB (ref 65–99)

## 2015-04-22 MED ORDER — ALBUTEROL SULFATE HFA 108 (90 BASE) MCG/ACT IN AERS: 2.0000 | INHALATION_SPRAY | RESPIRATORY_TRACT | Status: DC | PRN

## 2015-04-22 MED ORDER — PREDNISONE 20 MG PO TABS
60.0000 mg | ORAL_TABLET | Freq: Once | ORAL | Status: AC
  Administered 2015-04-22: 60 mg via ORAL
  Filled 2015-04-22: qty 3

## 2015-04-22 MED ORDER — ALBUTEROL SULFATE (2.5 MG/3ML) 0.083% IN NEBU
5.0000 mg | INHALATION_SOLUTION | Freq: Once | RESPIRATORY_TRACT | Status: AC
  Administered 2015-04-22: 5 mg via RESPIRATORY_TRACT
  Filled 2015-04-22: qty 6

## 2015-04-22 MED ORDER — BENZONATATE 100 MG PO CAPS: 100.0000 mg | ORAL_CAPSULE | Freq: Three times a day (TID) | ORAL | Status: DC

## 2015-04-22 MED ORDER — PREDNISONE 20 MG PO TABS: 60.0000 mg | ORAL_TABLET | Freq: Every day | ORAL | Status: DC

## 2015-04-22 MED ORDER — BENZONATATE 100 MG PO CAPS
200.0000 mg | ORAL_CAPSULE | Freq: Once | ORAL | Status: AC
  Administered 2015-04-22: 200 mg via ORAL
  Filled 2015-04-22: qty 2

## 2015-04-22 MED ORDER — IPRATROPIUM BROMIDE 0.02 % IN SOLN
0.5000 mg | Freq: Once | RESPIRATORY_TRACT | Status: AC
  Administered 2015-04-22: 0.5 mg via RESPIRATORY_TRACT
  Filled 2015-04-22: qty 2.5

## 2015-04-22 MED ORDER — ALBUTEROL SULFATE HFA 108 (90 BASE) MCG/ACT IN AERS
2.0000 | INHALATION_SPRAY | RESPIRATORY_TRACT | Status: DC | PRN
  Administered 2015-04-22: 2 via RESPIRATORY_TRACT
  Filled 2015-04-22: qty 6.7

## 2015-04-22 MED ORDER — AEROCHAMBER PLUS W/MASK MISC
1.0000 | Freq: Once | Status: AC
  Administered 2015-04-22: 1

## 2015-04-22 NOTE — ED Notes (Signed)
Patient states cold symptoms x 1 week.   Patient states she is coughing up a lot of mucous which "makes me throw up".   Patient denies nasal congestion or other symptoms.   Patient states has been taking dayquil at home.

## 2015-04-22 NOTE — Discharge Instructions (Signed)
1. Medications: albuterol, prednisone, tessalon, usual home medications 2. Treatment: rest, drink plenty of fluids, begin OTC antihistamine (Zyrtec or Claritin)  3. Follow Up: Please followup with your primary doctor in 2-3 days for discussion of your diagnoses and further evaluation after today's visit; if you do not have a primary care doctor use the resource guide provided to find one; Please return to the ER for difficulty breathing, high fevers or worsening symptoms.     Asthma, Adult Asthma is a recurring condition in which the airways tighten and narrow. Asthma can make it difficult to breathe. It can cause coughing, wheezing, and shortness of breath. Asthma episodes, also called asthma attacks, range from minor to life-threatening. Asthma cannot be cured, but medicines and lifestyle changes can help control it. CAUSES Asthma is believed to be caused by inherited (genetic) and environmental factors, but its exact cause is unknown. Asthma may be triggered by allergens, lung infections, or irritants in the air. Asthma triggers are different for each person. Common triggers include:   Animal dander.  Dust mites.  Cockroaches.  Pollen from trees or grass.  Mold.  Smoke.  Air pollutants such as dust, household cleaners, hair sprays, aerosol sprays, paint fumes, strong chemicals, or strong odors.  Cold air, weather changes, and winds (which increase molds and pollens in the air).  Strong emotional expressions such as crying or laughing hard.  Stress.  Certain medicines (such as aspirin) or types of drugs (such as beta-blockers).  Sulfites in foods and drinks. Foods and drinks that may contain sulfites include dried fruit, potato chips, and sparkling grape juice.  Infections or inflammatory conditions such as the flu, a cold, or an inflammation of the nasal membranes (rhinitis).  Gastroesophageal reflux disease (GERD).  Exercise or strenuous activity. SYMPTOMS Symptoms may  occur immediately after asthma is triggered or many hours later. Symptoms include:  Wheezing.  Excessive nighttime or early morning coughing.  Frequent or severe coughing with a common cold.  Chest tightness.  Shortness of breath. DIAGNOSIS  The diagnosis of asthma is made by a review of your medical history and a physical exam. Tests may also be performed. These may include:  Lung function studies. These tests show how much air you breathe in and out.  Allergy tests.  Imaging tests such as X-rays. TREATMENT  Asthma cannot be cured, but it can usually be controlled. Treatment involves identifying and avoiding your asthma triggers. It also involves medicines. There are 2 classes of medicine used for asthma treatment:   Controller medicines. These prevent asthma symptoms from occurring. They are usually taken every day.  Reliever or rescue medicines. These quickly relieve asthma symptoms. They are used as needed and provide short-term relief. Your health care provider will help you create an asthma action plan. An asthma action plan is a written plan for managing and treating your asthma attacks. It includes a list of your asthma triggers and how they may be avoided. It also includes information on when medicines should be taken and when their dosage should be changed. An action plan may also involve the use of a device called a peak flow meter. A peak flow meter measures how well the lungs are working. It helps you monitor your condition. HOME CARE INSTRUCTIONS   Take medicines only as directed by your health care provider. Speak with your health care provider if you have questions about how or when to take the medicines.  Use a peak flow meter as directed by your health  care provider. Record and keep track of readings.  Understand and use the action plan to help minimize or stop an asthma attack without needing to seek medical care.  Control your home environment in the following  ways to help prevent asthma attacks:  Do not smoke. Avoid being exposed to secondhand smoke.  Change your heating and air conditioning filter regularly.  Limit your use of fireplaces and wood stoves.  Get rid of pests (such as roaches and mice) and their droppings.  Throw away plants if you see mold on them.  Clean your floors and dust regularly. Use unscented cleaning products.  Try to have someone else vacuum for you regularly. Stay out of rooms while they are being vacuumed and for a short while afterward. If you vacuum, use a dust mask from a hardware store, a double-layered or microfilter vacuum cleaner bag, or a vacuum cleaner with a HEPA filter.  Replace carpet with wood, tile, or vinyl flooring. Carpet can trap dander and dust.  Use allergy-proof pillows, mattress covers, and box spring covers.  Wash bed sheets and blankets every week in hot water and dry them in a dryer.  Use blankets that are made of polyester or cotton.  Clean bathrooms and kitchens with bleach. If possible, have someone repaint the walls in these rooms with mold-resistant paint. Keep out of the rooms that are being cleaned and painted.  Wash hands frequently. SEEK MEDICAL CARE IF:   You have wheezing, shortness of breath, or a cough even if taking medicine to prevent attacks.  The colored mucus you cough up (sputum) is thicker than usual.  Your sputum changes from clear or white to yellow, green, gray, or bloody.  You have any problems that may be related to the medicines you are taking (such as a rash, itching, swelling, or trouble breathing).  You are using a reliever medicine more than 2-3 times per week.  Your peak flow is still at 50-79% of your personal best after following your action plan for 1 hour.  You have a fever. SEEK IMMEDIATE MEDICAL CARE IF:   You seem to be getting worse and are unresponsive to treatment during an asthma attack.  You are short of breath even at rest.  You  get short of breath when doing very little physical activity.  You have difficulty eating, drinking, or talking due to asthma symptoms.  You develop chest pain.  You develop a fast heartbeat.  You have a bluish color to your lips or fingernails.  You are light-headed, dizzy, or faint.  Your peak flow is less than 50% of your personal best.   This information is not intended to replace advice given to you by your health care provider. Make sure you discuss any questions you have with your health care provider.   Document Released: 02/22/2005 Document Revised: 11/13/2014 Document Reviewed: 09/21/2012 Elsevier Interactive Patient Education 2016 [employer].    Emergency Department Resource Guide 1) Find a Doctor and Pay Out of Pocket Although you won't have to find out who is covered by your insurance plan, it is a good idea to ask around and get recommendations. You will then need to call the office and see if the doctor you have chosen will accept you as a new patient and what types of options they offer for patients who are self-pay. Some doctors offer discounts or will set up payment plans for their patients who do not have insurance, but you will need to ask  so you aren't surprised when you get to your appointment.  2) Contact Your Local Health Department Not all health departments have doctors that can see patients for sick visits, but many do, so it is worth a call to see if yours does. If you don't know where your local health department is, you can check in your phone book. The CDC also has a tool to help you locate your state's health department, and many state websites also have listings of all of their local health departments.  3) Find a [facility] If your illness is not likely to be very severe or complicated, you may want to try a walk in clinic. These are popping up all over the country in pharmacies, drugstores, and shopping centers. They're usually staffed by nurse  practitioners or physician assistants that have been trained to treat common illnesses and complaints. They're usually fairly quick and inexpensive. However, if you have serious medical issues or chronic medical problems, these are probably not your best option.  No Primary Care Doctor: - Call Health Connect at  [phone] - they can help you locate a primary care doctor that  accepts your insurance, provides certain services, etc. - Physician Referral Service- [phone]  Chronic Pain Problems: Organization         Address  Phone   Notes  [facility]  [phone] Patients need to be referred by their primary care doctor.   Medication Assistance: Organization         Address  Phone   Notes  [facility] Medication [facility] [address] [place], [place] [address] [phone] --Must be a resident of [facility] -- Must have NO insurance coverage whatsoever (no Medicaid/ Medicare, etc.) -- The pt. MUST have a primary care doctor that directs their care regularly and follows them in the community   Med[facility]ist  [phone]   [employer]  [phone]    Agencies that provide inexpensive medical care: Organization         Address  Phone   Notes  [place]  [phone]   [facility]    [phone]   [facility] [place], [place] [address] [phone]   [employer] [address], [place] [phone]   Planned Parenthood    [phone]   [facility]    [phone]   [place] and [place] [address], [place] Phone:  [phone], Fax:  [phone] Hours of Operation:  9 am - 6 pm, M-F.  Also accepts Medicaid/Medicare and self-pay.  [facility] for [place], [address], [place] Phone: [phone], Fax: [phone]. Hours of Operation:  8:30 am -  5:30 pm, M-F.  Also accepts Medicaid and self-pay.  [facility] [place] [address] [place] Phone: [phone]   [place], [place], [place] [phone], Ext. [phone] Mondays & Thursdays: 7-9 AM.  First 15 patients are seen on a first come, first serve basis.    [place] Providers:  Organization         Address  Phone   Notes  [facility] [address],  [phone] Also accepts self-pay patients.  [place], [place], [place]  [phone]  [place], [address], [place] [phone]   [place] [address] [place] [phone]   Lucianne Lei [address], [place]   [phone] Only accepts [place] Access [place] patients after they have their name applied to their card.   Self-Pay (no insurance) in [facility]:  Organization         Address  Phone   Notes  Sickle Cell Patients, [facility] Internal Medicine [place] [phone]   [facility] [place] [phone]   [facility] [place]  [place], [place], [place] [phone]   [facility]/Dr. Osei-Bonsu  [address], [place] or [address], [place] [phone] Phone number for both [place] and [place] locations is the same.  Urgent Medical and [facility] [address], [place] [phone]   [employer] [address], [place] or [address] [phone]   [facility] [address] [place] [phone], phone; [phone], fax Sees patients 1st and 3rd Saturday of every month.  Must not qualify for public or private insurance (i.e. Medicaid, Medicare, [place] Health Choice,  Veterans' Benefits)  Household income should be no more than 200% of the poverty level The clinic cannot treat you if you are pregnant or think you are pregnant  Sexually transmitted diseases are not treated at the clinic.    Dental Care: Organization         Address  Phone  Notes  [facility] Department of [facility] [place] [phone] Accepts children up to age 58 who are enrolled in [place] or [place]; pregnant women with a Medicaid card; and children who have applied for Medicaid or [place] Health Choice, but were declined, whose parents can pay a reduced fee at time of service.  [facility] Department of [facility]  [address], [place] [phone] Accepts children up to age 25 who are enrolled in [place] or [place]; pregnant women with a Medicaid card; and children who have applied for Medicaid or [place] Health Choice, but were declined, whose parents can pay a reduced fee at time of service.  [place] Adult Dental Access PROGRAM  [place] [phone] Patients are seen by appointment only. Walk-ins are not accepted. [place] will see patients 39 years of age and older. Monday - Tuesday (8am-5pm) Most Wednesdays (8:30-5pm) $30 per visit, cash only  [facility] Adult Dental Access PROGRAM  [address], [facility] [phone] Patients are seen by appointment only. Walk-ins are not accepted. [place] will see patients 18 years of age and older. One Wednesday Evening (Monthly: Volunteer Based).  $30 per visit, cash only  [place]  [phone] for adults; Children under age 33, call Graduate Pediatric Dentistry at [phone]. Children aged 39-14, please call [phone] to request a pediatric application.  Dental services are provided in all areas of dental care including fillings, crowns and bridges, complete and  partial dentures, implants, gum treatment, root canals, and extractions. Preventive care is also provided. Treatment is provided to both adults and children. Patients are selected via a lottery and there is often a waiting list.  [facility] [address] [place]  [phone] [email or web address]   [facility] [address] [place], [place] [phone], Ext. [phone] Second and Fourth Thursday of each month, opens at 6:30 AM; Clinic ends at 9 AM.  Patients are seen on a first-come first-served basis, and a limited number are seen during each clinic.   [facility]  [address] Hillard Danker [place], [place] [phone]   Eligibility Requirements You must have lived in [place], [place], or [place] counties for at least the last three months.   You cannot be eligible for state or federal sponsored [employer], including [employer], [place], or [employer].   You generally cannot be eligible for healthcare insurance through your employer.    How to apply: Eligibility screenings are held every Tuesday and Wednesday afternoon from 1:00 pm until 4:00 pm. You do not need an appointment for the interview!  [facility] [address] [place], [place]   [place]  [place] Department  [place]  [phone]    Behavioral Health Resources in the Community: Intensive Outpatient Programs Organization         Address  Phone  Notes  [place]. [address] [place], [place] [phone]   [facility] [address], [place], [place]   ADS: Alcohol & Drug S[facility] [address] [place], [place]   [place] [address],  [place], [place] or [phone]   Substance Abuse Resources Organization          Address  Phone  Notes  Alcohol and Drug Services  [phone]   [place]  [phone]   [facility] [place]   Chinita Pester  [phone]   Residential & Outpatient Substance Abuse Program  [phone]   Psychological Services Organization         Address  Phone  Notes  [facility] [place]  [place]  [phone]   [place] [address] [place] or [phone]    Mobile Crisis Teams Organization         Address  Phone  Notes  [facility], Mobile Crisis Care Unit  [phone]   [facility]  [address] [place], [place]   [place] [address], [place] [phone]    Self-Help/Support Groups Organization         Address  Phone             Notes  [place]. of [place] - variety of support groups  [place] Call for more information  Narcotics Anonymous (NA), [facility] [address], [employer] [place]  2 meetings at this location   Special educational needs teacher         Address  Phone  Notes  [facility]idential [facility] [place],    [place]  [phone])[phone]   [facility]  [address], [place] [phone], [place], [place]   [place], [place] [phone] Admissions: 8am-3pm M-F  Incentives Substance [place] [address],    [place], [place] [phone]   [facility] [address] [place], [place], [place]   The [place].,  [place], [place]   [facility] - Intensive Outpatient [address], [place], [place]   [facility] ([place] [address]  [place], [place] [phone] or [phone]   [facility] Services (RTS) [address], [place], [place] Accepts Medicaid  [facility] [place] [address],  [place] [phone] Substance Abuse/Addiction Treatment   [facility] Organization         Address  Phone  Notes  [facility]  3057959371   Domenic Schwab, PhD [address] [place], [place]   [phone] or [phone]   [place], [place] [phone]   [facility] [address] [place], [place] [phone] Insurance/Medicaid/sponsorship through [facility] and Families [address]                                    [place], [place] [phone] [place] [address], [place] [phone]    Dr. Adele Schilder  [phone]   [facility] [place] Dept. 1) [address], [place] 2) [place] 3)  [place] [address], [place] [phone]  [phone]   [place] [phone] or [phone] (After Hours)

## 2015-04-22 NOTE — ED Provider Notes (Signed)
CSN: [id number]     Arrival date & time 04/22/15  0900 History   First MD Initiated Contact with Patient 04/22/15 [phone]     Chief Complaint  Patient presents with  . Cough  . Emesis     (Consider location/radiation/quality/duration/timing/severity/associated sxs/prior Treatment) Patient is a 25 y.o. female presenting with vomiting. The history is provided by the patient and medical records. No language interpreter was used.  Emesis Associated symptoms: sore throat   Associated symptoms: no abdominal pain, no arthralgias, no chills, no diarrhea, no headaches and no myalgias      Grace Montgomery is a 25 y.o. female  with a hx of asthma presents to the Emergency Department complaining of gradual, persistent, progressively worsening cough, shortness of breath and wheezing onset with 5 weeks ago. Associated symptoms include rhinorrhea, nasal congestion, postnasal drip, sore throat.  Reports several episodes of posttussive emesis in the last week.  She reports that she has been taking DayQuil at home and that is helping her URI symptoms. She denies using an albuterol rescue inhaler at home as she has lost it. She reports worsening cough and wheezing over the last 24 hours with feelings of chest tightness and shortness of breath. She denies fever, chills, headache or neck pain, abdominal pain, diarrhea, weakness, dizziness, syncope  Past Medical History  Diagnosis Date  . Asthma   . 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 Anxiety   . Cannabis dependence    History reviewed. No pertinent past surgical history. Family History  Problem Relation Age of Onset  . Sickle cell anemia Mother   . Cancer Mother   . Depression Mother   . Diabetes Other     grandparents and aunts/uncles  . Stroke Other     grandparent   Social History  Substance Use Topics  . Smoking status: Current Every Day Smoker -- 0.25 packs/day    Types: Cigarettes  . Smokeless tobacco: None   . Alcohol Use: Yes     Comment: ocassionally   OB History    Gravida Para Term Preterm AB TAB SAB Ectopic Multiple Living   2 2 0 0 0 0 0 0 0 2      Review of Systems  Constitutional: Positive for fatigue. Negative for fever, chills, diaphoresis, appetite change and unexpected weight change.  HENT: Positive for congestion, postnasal drip, rhinorrhea, sinus pressure and sore throat. Negative for ear discharge, ear pain and mouth sores.   Eyes: Negative for visual disturbance.  Respiratory: Positive for cough, chest tightness, shortness of breath and wheezing. Negative for stridor.   Cardiovascular: Negative for chest pain, palpitations and leg swelling.  Gastrointestinal: Positive for vomiting. Negative for nausea, abdominal pain, diarrhea and constipation.  Endocrine: Negative for polydipsia, polyphagia and polyuria.  Genitourinary: Negative for dysuria, urgency, frequency and hematuria.  Musculoskeletal: Negative for myalgias, back pain, arthralgias and neck stiffness.  Skin: Negative for rash.  Allergic/Immunologic: Negative for immunocompromised state.  Neurological: Negative for syncope, light-headedness, numbness and headaches.  Hematological: Negative for adenopathy. Does not bruise/bleed easily.  Psychiatric/Behavioral: Negative for sleep disturbance. The patient is not nervous/anxious.   All other systems reviewed and are negative.     Allergies  Monistat; Banana; and Chocolate  Home Medications   Prior to Admission medications   Medication Sig Start Date End Date Taking? Authorizing Provider  albuterol (PROVENTIL HFA;VENTOLIN HFA) 108 (90 Base) MCG/ACT inhaler Inhale 2 puffs into the lungs every 6 (six) hours  as needed for wheezing or shortness of breath.   Yes Historical Provider, MD  albuterol (PROVENTIL HFA;VENTOLIN HFA) 108 (90 Base) MCG/ACT inhaler Inhale 2 puffs into the lungs every 4 (four) hours as needed for wheezing or shortness of breath. 04/22/15   Daisy Mcneel, PA-C  benzonatate (TESSALON) 100 MG capsule Take 1 capsule (100 mg total) by mouth every 8 (eight) hours. 04/22/15   Anson Peddie, PA-C  predniSONE (DELTASONE) 20 MG tablet Take 3 tablets (60 mg total) by mouth daily. 04/22/15   Debi Cousin, PA-C   BP 109/75 mmHg  Pulse 90  Temp(Src) 97.5 F (36.4 C) (Oral)  Resp 18  Ht 5\' 4"  (1.626 m)  Wt 101.606 kg  BMI 38.43 kg/m2  SpO2 100%  LMP 04/02/2015 Physical Exam  Constitutional: She is oriented to person, place, and time. She appears well-developed and well-nourished. No distress.  Awake, alert, nontoxic appearance  HENT:  Head: Normocephalic and atraumatic.  Right Ear: Tympanic membrane, external ear and ear canal normal.  Left Ear: Tympanic membrane, external ear and ear canal normal.  Nose: Mucosal edema and rhinorrhea present. No epistaxis. Right sinus exhibits no maxillary sinus tenderness and no frontal sinus tenderness. Left sinus exhibits no maxillary sinus tenderness and no frontal sinus tenderness.  Mouth/Throat: Uvula is midline, oropharynx is clear and moist and mucous membranes are normal. Mucous membranes are not pale and not cyanotic. No oropharyngeal exudate, posterior oropharyngeal edema, posterior oropharyngeal erythema or tonsillar abscesses.  Eyes: Conjunctivae are normal. Pupils are equal, round, and reactive to light. No scleral icterus.  Neck: Normal range of motion and full passive range of motion without pain. Neck supple.  Cardiovascular: Normal rate, regular rhythm, normal heart sounds and intact distal pulses.   No murmur heard. Pulmonary/Chest: Effort normal. No accessory muscle usage or stridor. No tachypnea. No respiratory distress. She has decreased breath sounds. She has wheezes.  Fine surgery wheezes throughout, no respiratory distress or accessory muscle usage  Abdominal: Soft. Bowel sounds are normal. She exhibits no mass. There is no tenderness. There is no rebound and no  guarding.  Musculoskeletal: Normal range of motion. She exhibits no edema.  Lymphadenopathy:    She has no cervical adenopathy.  Neurological: She is alert and oriented to person, place, and time.  Speech is clear and goal oriented Moves extremities without ataxia  Skin: Skin is warm and dry. No rash noted. She is not diaphoretic.  Psychiatric: She has a normal mood and affect.  Nursing note and vitals reviewed.   ED Course  Procedures (including critical care time) Labs Review Labs Reviewed  CBG MONITORING, ED - Abnormal; Notable for the following:    Glucose-Capillary 120 (*)    All other components within normal limits    Imaging Review Dg Chest 2 View  04/22/2015  CLINICAL DATA:  Cough, cold, chills, and post-tussive emesis for 3 days, history smoking, asthma EXAM: CHEST  2 VIEW COMPARISON:  05/14/2014 FINDINGS: Upper normal heart size. Normal mediastinal contours and pulmonary vascularity. Lungs clear. No pleural effusion or pneumothorax. Bones unremarkable. IMPRESSION: No acute abnormalities. Electronically Signed   By: Lavonia Dana M.D.   On: 04/22/2015 10:40   I have personally reviewed and evaluated these images and lab results as part of my medical decision-making.    MDM   Final diagnoses:  Viral URI with cough  Asthma exacerbation   Grace Montgomery presents with URI 1 week, wheezing and evidence of asthma exacerbation. Chest x-ray without evidence of  abnormalities, specifically no evidence of pneumonia. Blood sugar is within normal limits.     Patient ambulated in ED with O2 saturations maintained >90, no current signs of respiratory distress. Lung exam improved after nebulizer treatment. Prednisone given in the ED and pt will bd dc with 5 day burst. Pt states they are breathing at baseline. Pt has been instructed to continue using prescribed medications and to speak with PCP about today's exacerbation.     Jarrett Soho Serai Tukes, PA-C 04/22/15 1411  Jola Schmidt, MD 04/22/15 [phone]

## 2015-06-20 ENCOUNTER — Encounter (HOSPITAL_COMMUNITY): Payer: Self-pay

## 2015-06-20 ENCOUNTER — Emergency Department (HOSPITAL_COMMUNITY)
Admission: EM | Admit: 2015-06-20 | Discharge: 2015-06-20 | Disposition: A | Discharge status: Home / Self Care | Payer: Medicaid Other | Attending: Emergency Medicine | Admitting: Emergency Medicine

## 2015-06-20 DIAGNOSIS — J45901 Unspecified asthma with (acute) exacerbation: Secondary | ICD-10-CM | POA: Insufficient documentation

## 2015-06-20 DIAGNOSIS — Z7952 Long term (current) use of systemic steroids: Secondary | ICD-10-CM | POA: Insufficient documentation

## 2015-06-20 DIAGNOSIS — F17210 Nicotine dependence, cigarettes, uncomplicated: Secondary | ICD-10-CM | POA: Insufficient documentation

## 2015-06-20 DIAGNOSIS — Z8659 Personal history of other mental and behavioral disorders: Secondary | ICD-10-CM | POA: Insufficient documentation

## 2015-06-20 DIAGNOSIS — Z79899 Other long term (current) drug therapy: Secondary | ICD-10-CM | POA: Insufficient documentation

## 2015-06-20 DIAGNOSIS — R000 Tachycardia, unspecified: Secondary | ICD-10-CM | POA: Insufficient documentation

## 2015-06-20 MED ORDER — ALBUTEROL SULFATE HFA 108 (90 BASE) MCG/ACT IN AERS
2.0000 | INHALATION_SPRAY | Freq: Once | RESPIRATORY_TRACT | Status: AC
  Administered 2015-06-20: 2 via RESPIRATORY_TRACT
  Filled 2015-06-20: qty 6.7

## 2015-06-20 MED ORDER — ALBUTEROL SULFATE (2.5 MG/3ML) 0.083% IN NEBU
5.0000 mg | INHALATION_SOLUTION | Freq: Once | RESPIRATORY_TRACT | Status: AC
  Administered 2015-06-20: 5 mg via RESPIRATORY_TRACT
  Filled 2015-06-20: qty 6

## 2015-06-20 NOTE — ED Provider Notes (Signed)
CSN: [id number]     Arrival date & time 06/20/15  1617 History  By signing my name below, I, Soijett Blue, attest that this documentation has been prepared under the direction and in the presence of Clayton Bibles, PA-C Electronically Signed: Soijett Blue, ED Scribe. 06/20/2015. 5:11 PM.   Chief Complaint  Patient presents with  . Asthma      The history is provided by the patient. No language interpreter was used.    Grace Montgomery is a 25 y.o. female with a medical hx of asthma who presents to the Emergency Department complaining of asthma exacerbation onset 2 days ago. Pt reports that she had an asthma attack due to the seasonal changes and pollen. Pt notes that she was unable to use her rescue inhaler because she was unable to find it.  Pt was informed by her PCP to come into the ED for further evaluation. She notes that she was given a breathing treatment while in the ED and that alleviated her symptoms.  Currently she feels back to normal, all symptoms have resolved. Pt is having associated symptoms of cough. She denies CP, leg swelling, hemoptysis, rash, and any other symptoms.    Past Medical History  Diagnosis Date  . Asthma   . 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 Anxiety   . Cannabis dependence    History reviewed. No pertinent past surgical history. Family History  Problem Relation Age of Onset  . Sickle cell anemia Mother   . Cancer Mother   . Depression Mother   . Diabetes Other     grandparents and aunts/uncles  . Stroke Other     grandparent   Social History  Substance Use Topics  . Smoking status: Current Every Day Smoker -- 0.25 packs/day    Types: Cigarettes  . Smokeless tobacco: None  . Alcohol Use: Yes     Comment: ocassionally   OB History    Gravida Para Term Preterm AB TAB SAB Ectopic Multiple Living   2 2 0 0 0 0 0 0 0 2      Review of Systems  Constitutional: Negative for fever.  Respiratory: Positive for cough.  Negative for shortness of breath.   Cardiovascular: Negative for chest pain and leg swelling.  Musculoskeletal: Negative for myalgias.  Skin: Negative for rash.  Allergic/Immunologic: Negative for immunocompromised state.      Allergies  Monistat; Banana; and Chocolate  Home Medications   Prior to Admission medications   Medication Sig Start Date End Date Taking? Authorizing Provider  albuterol (PROVENTIL HFA;VENTOLIN HFA) 108 (90 Base) MCG/ACT inhaler Inhale 2 puffs into the lungs every 6 (six) hours as needed for wheezing or shortness of breath.    Historical Provider, MD  albuterol (PROVENTIL HFA;VENTOLIN HFA) 108 (90 Base) MCG/ACT inhaler Inhale 2 puffs into the lungs every 4 (four) hours as needed for wheezing or shortness of breath. 04/22/15   Hannah Muthersbaugh, PA-C  benzonatate (TESSALON) 100 MG capsule Take 1 capsule (100 mg total) by mouth every 8 (eight) hours. 04/22/15   Hannah Muthersbaugh, PA-C  predniSONE (DELTASONE) 20 MG tablet Take 3 tablets (60 mg total) by mouth daily. 04/22/15   Hannah Muthersbaugh, PA-C   BP 113/83 mmHg  Pulse 104  Temp(Src) 98.7 F (37.1 C) (Oral)  Resp 20  SpO2 95%  LMP 06/01/2015 Physical Exam  Constitutional: She appears well-developed and well-nourished. No distress.  HENT:  Head: Normocephalic and atraumatic.  Mouth/Throat:  Oropharynx is clear and moist. No oropharyngeal exudate.  Eyes: Conjunctivae are normal.  Neck: Neck supple.  Cardiovascular: Regular rhythm and normal heart sounds.  Tachycardia present.  Exam reveals no gallop and no friction rub.   No murmur heard. Slightly tachycardic  Pulmonary/Chest: Effort normal and breath sounds normal. No respiratory distress. She has no wheezes. She has no rales.  Pt able to speak in full and clear sentences  Neurological: She is alert.  Skin: She is not diaphoretic.  Nursing note and vitals reviewed.   ED Course  Procedures (including critical care time) DIAGNOSTIC  STUDIES: Oxygen Saturation is 95% on RA, adequate by my interpretation.    COORDINATION OF CARE: 5:10 PM Discussed treatment plan with pt at bedside which includes breathing treatment and albuterol inhaler and pt agreed to plan.    Labs Review Labs Reviewed - No data to display  Imaging Review No results found.    EKG Interpretation None      MDM   Final diagnoses:  Asthma attack    Afebrile, nontoxic patient with asthma attack,back to normal after single breathing treatment, requesting to be d/c home as soon as I saw her.  Denies PE risk factors.  Lungs CTAB.   D/C home with albuterol hfa.  Discussed result, findings, treatment, and follow up  with patient.  Pt given return precautions.  Pt verbalizes understanding and agrees with plan.        I personally performed the services described in this documentation, which was scribed in my presence. The recorded information has been reviewed and is accurate.    Clayton Bibles, PA-C 06/20/15 [place], MD 06/25/15 [phone]

## 2015-06-20 NOTE — ED Notes (Signed)
Pt. Verbalizes understanding of d/c instructions. Pt. Going home with mother and voices no concerns. Pt. Ambulatory out of the unit with no s/s distress.

## 2015-06-20 NOTE — Discharge Instructions (Signed)
Read the information below.  You may return to the Emergency Department at any time for worsening condition or any new symptoms that concern you.  If you develop worsening shortness of breath, uncontrolled wheezing, severe chest pain, or fevers despite using tylenol and/or ibuprofen, return for a recheck.       Asthma, Adult Asthma is a condition of the lungs in which the airways tighten and narrow. Asthma can make it hard to breathe. Asthma cannot be cured, but medicine and lifestyle changes can help control it. Asthma may be started (triggered) by:  Animal skin flakes (dander).  Dust.  Cockroaches.  Pollen.  Mold.  Smoke.  Cleaning products.  Hair sprays or aerosol sprays.  Paint fumes or strong smells.  Cold air, weather changes, and winds.  Crying or laughing hard.  Stress.  Certain medicines or drugs.  Foods, such as dried fruit, potato chips, and sparkling grape juice.  Infections or conditions (colds, flu).  Exercise.  Certain medical conditions or diseases.  Exercise or tiring activities. HOME CARE   Take medicine as told by your doctor.  Use a peak flow meter as told by your doctor. A peak flow meter is a tool that measures how well the lungs are working.  Record and keep track of the peak flow meter's readings.  Understand and use the asthma action plan. An asthma action plan is a written plan for taking care of your asthma and treating your attacks.  To help prevent asthma attacks:  Do not smoke. Stay away from secondhand smoke.  Change your heating and air conditioning filter often.  Limit your use of fireplaces and wood stoves.  Get rid of pests (such as roaches and mice) and their droppings.  Throw away plants if you see mold on them.  Clean your floors. Dust regularly. Use cleaning products that do not smell.  Have someone vacuum when you are not home. Use a vacuum cleaner with a HEPA filter if possible.  Replace carpet with wood,  tile, or vinyl flooring. Carpet can trap animal skin flakes and dust.  Use allergy-proof pillows, mattress covers, and box spring covers.  Wash bed sheets and blankets every week in hot water and dry them in a dryer.  Use blankets that are made of polyester or cotton.  Clean bathrooms and kitchens with bleach. If possible, have someone repaint the walls in these rooms with mold-resistant paint. Keep out of the rooms that are being cleaned and painted.  Wash hands often. GET HELP IF:  You have make a whistling sound when breaking (wheeze), have shortness of breath, or have a cough even if taking medicine to prevent attacks.  The colored mucus you cough up (sputum) is thicker than usual.  The colored mucus you cough up changes from clear or white to yellow, green, gray, or bloody.  You have problems from the medicine you are taking such as:  A rash.  Itching.  Swelling.  Trouble breathing.  You need reliever medicines more than 2-3 times a week.  Your peak flow measurement is still at 50-79% of your personal best after following the action plan for 1 hour.  You have a fever. GET HELP RIGHT AWAY IF:   You seem to be worse and are not responding to medicine during an asthma attack.  You are short of breath even at rest.  You get short of breath when doing very little activity.  You have trouble eating, drinking, or talking.  You have chest  pain.  You have a fast heartbeat.  Your lips or fingernails start to turn blue.  You are light-headed, dizzy, or faint.  Your peak flow is less than 50% of your personal best.   This information is not intended to replace advice given to you by your health care provider. Make sure you discuss any questions you have with your health care provider.   Document Released: 08/11/2007 Document Revised: 11/13/2014 Document Reviewed: 09/21/2012 Elsevier Interactive Patient Education [employer].

## 2015-06-20 NOTE — ED Notes (Signed)
Pt reports she is having an asthma attack due to the pollen, onset 2 days ago. Her rescue inhaler is empty so she was unable to use. Auditory wheezing heard and pt is coughing at triage. Pt able to speak in clear, complete sentences and O2 sats 98% RA.

## 2015-10-19 ENCOUNTER — Encounter (HOSPITAL_COMMUNITY): Payer: Self-pay | Admitting: Emergency Medicine

## 2015-10-19 ENCOUNTER — Emergency Department (HOSPITAL_COMMUNITY)
Admission: EM | Admit: 2015-10-19 | Discharge: 2015-10-19 | Disposition: A | Discharge status: Home / Self Care | Payer: Medicaid Other | Attending: Emergency Medicine | Admitting: Emergency Medicine

## 2015-10-19 DIAGNOSIS — W503XXA Accidental bite by another person, initial encounter: Secondary | ICD-10-CM | POA: Insufficient documentation

## 2015-10-19 DIAGNOSIS — J45909 Unspecified asthma, uncomplicated: Secondary | ICD-10-CM | POA: Insufficient documentation

## 2015-10-19 DIAGNOSIS — Y999 Unspecified external cause status: Secondary | ICD-10-CM | POA: Insufficient documentation

## 2015-10-19 DIAGNOSIS — F17210 Nicotine dependence, cigarettes, uncomplicated: Secondary | ICD-10-CM | POA: Insufficient documentation

## 2015-10-19 DIAGNOSIS — S61251A Open bite of left index finger without damage to nail, initial encounter: Secondary | ICD-10-CM | POA: Insufficient documentation

## 2015-10-19 DIAGNOSIS — Y939 Activity, unspecified: Secondary | ICD-10-CM | POA: Insufficient documentation

## 2015-10-19 DIAGNOSIS — Z23 Encounter for immunization: Secondary | ICD-10-CM | POA: Insufficient documentation

## 2015-10-19 DIAGNOSIS — Y929 Unspecified place or not applicable: Secondary | ICD-10-CM | POA: Insufficient documentation

## 2015-10-19 MED ORDER — AMOXICILLIN-POT CLAVULANATE 875-125 MG PO TABS
1.0000 | ORAL_TABLET | Freq: Once | ORAL | Status: AC
  Administered 2015-10-19: 1 via ORAL
  Filled 2015-10-19: qty 1

## 2015-10-19 MED ORDER — HYDROCODONE-ACETAMINOPHEN 5-325 MG PO TABS: 1.0000 | ORAL_TABLET | Freq: Four times a day (QID) | ORAL | 0 refills | Status: DC | PRN

## 2015-10-19 MED ORDER — AMOXICILLIN-POT CLAVULANATE 875-125 MG PO TABS
1.0000 | ORAL_TABLET | Freq: Two times a day (BID) | ORAL | 0 refills | Status: DC
Start: 2015-10-19 — End: 2016-12-25

## 2015-10-19 MED ORDER — TETANUS-DIPHTH-ACELL PERTUSSIS 5-2.5-18.5 LF-MCG/0.5 IM SUSP
0.5000 mL | Freq: Once | INTRAMUSCULAR | Status: AC
  Administered 2015-10-19: 0.5 mL via INTRAMUSCULAR
  Filled 2015-10-19: qty 0.5
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
## 2015-10-19 NOTE — ED Triage Notes (Signed)
Last Tdap 2011
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
## 2015-10-19 NOTE — Discharge Instructions (Signed)
It is imperative that you call the hand specialist for close follow-up.  Bite wounds such as this can easily become infected.  Please take you antibiotics as directed.  Keep the wound clean and dry.  Return to the ER for: Fever Redness Pus draining from wound Inability to move finger Increased swelling

## 2015-10-19 NOTE — ED Notes (Signed)
Declined W/C at D/C and was escorted to lobby by RN. 

## 2015-10-20 ENCOUNTER — Telehealth: Payer: Self-pay | Admitting: *Deleted

## 2015-11-19 ENCOUNTER — Encounter (HOSPITAL_COMMUNITY): Payer: Self-pay

## 2015-11-19 ENCOUNTER — Emergency Department (HOSPITAL_COMMUNITY)
Admission: EM | Admit: 2015-11-19 | Discharge: 2015-11-19 | Disposition: A | Discharge status: Home / Self Care | Payer: Medicaid Other | Attending: Emergency Medicine | Admitting: Emergency Medicine

## 2015-11-19 DIAGNOSIS — N898 Other specified noninflammatory disorders of vagina: Secondary | ICD-10-CM

## 2015-11-19 DIAGNOSIS — F17210 Nicotine dependence, cigarettes, uncomplicated: Secondary | ICD-10-CM | POA: Insufficient documentation

## 2015-11-19 DIAGNOSIS — Z79899 Other long term (current) drug therapy: Secondary | ICD-10-CM | POA: Insufficient documentation

## 2015-11-19 DIAGNOSIS — J45909 Unspecified asthma, uncomplicated: Secondary | ICD-10-CM | POA: Insufficient documentation

## 2015-11-19 DIAGNOSIS — A599 Trichomoniasis, unspecified: Secondary | ICD-10-CM

## 2015-11-19 LAB — WET PREP, GENITAL
Clue Cells Wet Prep HPF POC: NONE SEEN
Sperm: NONE SEEN
Yeast Wet Prep HPF POC: NONE SEEN

## 2015-11-19 LAB — URINALYSIS, ROUTINE W REFLEX MICROSCOPIC
Bilirubin Urine: NEGATIVE
Glucose, UA: NEGATIVE mg/dL
Ketones, ur: NEGATIVE mg/dL
Nitrite: NEGATIVE
Protein, ur: NEGATIVE mg/dL
Specific Gravity, Urine: 1.021 (ref 1.005–1.030)
pH: 6 (ref 5.0–8.0)

## 2015-11-19 LAB — URINE MICROSCOPIC-ADD ON

## 2015-11-19 LAB — POC URINE PREG, ED: Preg Test, Ur: NEGATIVE

## 2015-11-19 MED ORDER — AZITHROMYCIN 250 MG PO TABS
1000.0000 mg | ORAL_TABLET | Freq: Once | ORAL | Status: AC
  Administered 2015-11-19: 1000 mg via ORAL
  Filled 2015-11-19: qty 4

## 2015-11-19 MED ORDER — CEFTRIAXONE SODIUM 250 MG IJ SOLR
250.0000 mg | Freq: Once | INTRAMUSCULAR | Status: AC
  Administered 2015-11-19: 250 mg via INTRAMUSCULAR
  Filled 2015-11-19: qty 250

## 2015-11-19 MED ORDER — METRONIDAZOLE 500 MG PO TABS
2000.0000 mg | ORAL_TABLET | Freq: Once | ORAL | Status: AC
  Administered 2015-11-19: 2000 mg via ORAL
  Filled 2015-11-19: qty 4

## 2015-11-19 NOTE — ED Triage Notes (Signed)
Patient complains of vaginal itching the past several days following antibiotic use, also complains of dysuiia

## 2015-11-19 NOTE — Discharge Instructions (Signed)
Return here as needed. Follow up with your doctor. °

## 2015-11-19 NOTE — ED Notes (Signed)
Called pt's name to update vitals, no one answered.

## 2015-11-20 LAB — GC/CHLAMYDIA PROBE AMP (~~LOC~~) NOT AT ARMC
Chlamydia: NEGATIVE
Neisseria Gonorrhea: NEGATIVE

## 2015-11-21 NOTE — ED Provider Notes (Signed)
[facility] DEPT Provider Note   CSN: [id number] Arrival date & time: 11/19/15  1246     History   Chief Complaint Chief Complaint  Patient presents with  . Vaginal Itching    HPI Grace Montgomery is a 25 y.o. female.  HPI Patient presents to the emergency department with vaginal irritation discharge.  Patient states that she was recently placed on antibiotics several weeks ago and started having vaginal irritation.  Patient states he is concerned that there is something else going on based on the fact she had discharge.  She states that nothing seems make the condition better or worse.  She did not take any medications prior to arrival.  Patient states that she last had unprotected sex one month ago. The patient denies chest pain, shortness of breath, headache,blurred vision, neck pain, fever, cough, weakness, numbness, dizziness, anorexia, edema, abdominal pain, nausea, vomiting, diarrhea, rash, back pain, dysuria, hematemesis, bloody stool, near syncope, or syncope. Past Medical History:  Diagnosis Date  . Anxiety   . Asthma   . Cannabis dependence   . 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 Contraception management 08/22/2012  . Rash and nonspecific skin eruption 08/22/2012  . Obesity 06/29/2011  . Seasonal allergies 06/29/2011  . Asthma, mild persistent 06/29/2011  . Major depressive disorder ([place]) 04/26/2011    Class: Acute    History reviewed. No pertinent surgical history.  OB History    Gravida Para Term Preterm AB Living   2 2 0 0 0 2   SAB TAB Ectopic Multiple Live Births   0 0 0 0         Home Medications    Prior to Admission medications   Medication Sig Start Date End Date Taking? Authorizing Provider  diphenhydramine-acetaminophen (TYLENOL PM) 25-500 MG TABS tablet Take 1 tablet by mouth at bedtime as needed.   Yes Historical Provider, MD  albuterol (PROVENTIL  HFA;VENTOLIN HFA) 108 (90 Base) MCG/ACT inhaler Inhale 2 puffs into the lungs every 4 (four) hours as needed for wheezing or shortness of breath. 04/22/15   Hannah Muthersbaugh, PA-C  amoxicillin-clavulanate (AUGMENTIN) 875-125 MG tablet Take 1 tablet by mouth every 12 (twelve) hours. Patient not taking: Reported on 11/19/2015 10/19/15   Montine Circle, PA-C  benzonatate (TESSALON) 100 MG capsule Take 1 capsule (100 mg total) by mouth every 8 (eight) hours. Patient not taking: Reported on 11/19/2015 04/22/15   Jarrett Soho Muthersbaugh, PA-C  HYDROcodone-acetaminophen (NORCO/VICODIN) 5-325 MG tablet Take 1-2 tablets by mouth every 6 (six) hours as needed. Patient not taking: Reported on 11/19/2015 10/19/15   Montine Circle, PA-C  predniSONE (DELTASONE) 20 MG tablet Take 3 tablets (60 mg total) by mouth daily. Patient not taking: Reported on 11/19/2015 04/22/15   Jarrett Soho Muthersbaugh, PA-C    Family History Family History  Problem Relation Age of Onset  . Sickle cell anemia Mother   . Cancer Mother   . Depression Mother   . Diabetes Other     grandparents and aunts/uncles  . Stroke Other     grandparent    Social History Social History  Substance Use Topics  . Smoking status: Current Every Day Smoker    Packs/day: 0.25    Types: Cigarettes  . Smokeless tobacco: Never Used  . Alcohol use Yes     Comment: ocassionally     Allergies   Monistat [tioconazole]; Chocolate; and Banana  Review of Systems Review of Systems All other systems negative except as documented in the HPI. All pertinent positives and negatives as reviewed in the HPI. Physical Exam Updated Vital Signs BP 124/86   Pulse 60   Temp 98.9 F (37.2 C) (Oral)   Resp 19   SpO2 100%   Physical Exam  Constitutional: She is oriented to person, place, and time. She appears well-developed and well-nourished. No distress.  HENT:  Head: Normocephalic and atraumatic.  Mouth/Throat: Oropharynx is clear and moist.  Eyes: Pupils  are equal, round, and reactive to light.  Neck: Normal range of motion. Neck supple.  Cardiovascular: Normal rate, regular rhythm and normal heart sounds.  Exam reveals no gallop and no friction rub.   No murmur heard. Pulmonary/Chest: Effort normal and breath sounds normal. No respiratory distress. She has no wheezes.  Abdominal: Soft. Bowel sounds are normal. She exhibits no distension. There is no tenderness.  Genitourinary: Cervix exhibits discharge. Cervix exhibits no motion tenderness and no friability. Right adnexum displays no mass and no tenderness. Left adnexum displays no mass and no tenderness. No bleeding in the vagina. Vaginal discharge found.  Neurological: She is alert and oriented to person, place, and time. She exhibits normal muscle tone. Coordination normal.  Skin: Skin is warm and dry. No rash noted. No erythema.  Psychiatric: She has a normal mood and affect. Her behavior is normal.  Nursing note and vitals reviewed.    ED Treatments / Results  Labs (all labs ordered are listed, but only abnormal results are displayed) Labs Reviewed  WET PREP, GENITAL - Abnormal; Notable for the following:       Result Value   Trich, Wet Prep PRESENT (*)    WBC, Wet Prep HPF POC MANY (*)    All other components within normal limits  URINALYSIS, ROUTINE W REFLEX MICROSCOPIC (NOT AT [facility]) - Abnormal; Notable for the following:    APPearance TURBID (*)    Hgb urine dipstick LARGE (*)    Leukocytes, UA LARGE (*)    All other components within normal limits  URINE MICROSCOPIC-ADD ON - Abnormal; Notable for the following:    Squamous Epithelial / LPF TOO NUMEROUS TO COUNT (*)    Bacteria, UA MANY (*)    All other components within normal limits  POC URINE PREG, ED  GC/CHLAMYDIA PROBE AMP ([place]) NOT AT [facility]    EKG  EKG Interpretation None       Radiology No results found.  Procedures Procedures (including critical care time)  Medications Ordered in  ED Medications  metroNIDAZOLE (FLAGYL) tablet 2,000 mg (2,000 mg Oral Given 11/19/15 2036)  azithromycin (ZITHROMAX) tablet 1,000 mg (1,000 mg Oral Given 11/19/15 2035)  cefTRIAXone (ROCEPHIN) injection 250 mg (250 mg Intramuscular Given 11/19/15 2036)     Initial Impression / Assessment and Plan / ED Course  I have reviewed the triage vital signs and the nursing notes.  Pertinent labs & imaging results that were available during my care of the patient were reviewed by me and considered in my medical decision making (see chart for details).  Clinical Course  Patient be treated for STDs along with trichomonas.  Told to return here as needed.  Patient agrees the plan and all questions were answered.  I do not feel she has PID based on her neck.  She does not have any adnexal tenderness or palpable.  Lower pelvic pain.  I did advise her to follow-up with her GYN doctor  Final Clinical Impressions(s) / ED Diagnoses   Final diagnoses:  Trichomoniasis  Vaginal irritation    New Prescriptions Discharge Medication List as of 11/19/2015  8:32 PM       Dalia Heading, PA-C 11/21/15 [id number]    Virgel Manifold, MD 11/23/15 [phone]

## 2016-05-16 ENCOUNTER — Emergency Department (HOSPITAL_COMMUNITY)
Admission: EM | Admit: 2016-05-16 | Discharge: 2016-05-16 | Disposition: A | Discharge status: Home / Self Care | Payer: Self-pay | Attending: Emergency Medicine | Admitting: Emergency Medicine

## 2016-05-16 ENCOUNTER — Emergency Department (HOSPITAL_COMMUNITY): Payer: Self-pay

## 2016-05-16 ENCOUNTER — Encounter (HOSPITAL_COMMUNITY): Payer: Self-pay

## 2016-05-16 DIAGNOSIS — F17210 Nicotine dependence, cigarettes, uncomplicated: Secondary | ICD-10-CM | POA: Insufficient documentation

## 2016-05-16 DIAGNOSIS — Y9341 Activity, dancing: Secondary | ICD-10-CM | POA: Insufficient documentation

## 2016-05-16 DIAGNOSIS — M25562 Pain in left knee: Secondary | ICD-10-CM | POA: Insufficient documentation

## 2016-05-16 DIAGNOSIS — X509XXA Other and unspecified overexertion or strenuous movements or postures, initial encounter: Secondary | ICD-10-CM | POA: Insufficient documentation

## 2016-05-16 DIAGNOSIS — Y999 Unspecified external cause status: Secondary | ICD-10-CM | POA: Insufficient documentation

## 2016-05-16 DIAGNOSIS — J45909 Unspecified asthma, uncomplicated: Secondary | ICD-10-CM | POA: Insufficient documentation

## 2016-05-16 DIAGNOSIS — Y92009 Unspecified place in unspecified non-institutional (private) residence as the place of occurrence of the external cause: Secondary | ICD-10-CM | POA: Insufficient documentation

## 2016-05-16 NOTE — Discharge Instructions (Signed)
X-ray shows no fracture. Please wear the knee brace for comfort. Follow-up with orthopedist. Rest, ice, elevate her left knee. Ambulate as able. Return to ED if symptoms worsen.

## 2016-05-16 NOTE — ED Notes (Signed)
Declined W/C at D/C and was escorted to lobby by RN. 

## 2016-05-16 NOTE — ED Provider Notes (Signed)
[facility] DEPT Provider Note   CSN: [id number] Arrival date & time: 05/16/16  1448   By signing my name below, I, Evelene Croon, attest that this documentation has been prepared under the direction and in the presence of Ocie Cornfield, PA-C. Electronically Signed: Evelene Croon, Scribe. 05/16/2016. 3:45 PM.   History   Chief Complaint Chief Complaint  Patient presents with  . Knee Injury     The history is provided by the patient. No language interpreter was used.    HPI Comments:  Grace Montgomery is a 26 y.o. female who presents to the Emergency Department complaining of gradually improving left knee pain s/p injury ~1.5 weeks ago. She injured the knee while doing a twisting type dance at home. While the pain has improved she has increased pain with long periods of standing which she does at work. No alleviating factors noted. Pt has no acute other complaints or associated symptoms at this time.     Past Medical History:  Diagnosis Date  . Anxiety   . Asthma   . Cannabis dependence   . 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 Contraception management 08/22/2012  . Rash and nonspecific skin eruption 08/22/2012  . Obesity 06/29/2011  . Seasonal allergies 06/29/2011  . Asthma, mild persistent 06/29/2011  . Major depressive disorder 04/26/2011    Class: Acute    History reviewed. No pertinent surgical history.  OB History    Gravida Para Term Preterm AB Living   2 2 0 0 0 2   SAB TAB Ectopic Multiple Live Births   0 0 0 0         Home Medications    Prior to Admission medications   Medication Sig Start Date End Date Taking? Authorizing Provider  albuterol (PROVENTIL HFA;VENTOLIN HFA) 108 (90 Base) MCG/ACT inhaler Inhale 2 puffs into the lungs every 4 (four) hours as needed for wheezing or shortness of breath. 04/22/15   Hannah Muthersbaugh, PA-C  amoxicillin-clavulanate  (AUGMENTIN) 875-125 MG tablet Take 1 tablet by mouth every 12 (twelve) hours. Patient not taking: Reported on 11/19/2015 10/19/15   Montine Circle, PA-C  benzonatate (TESSALON) 100 MG capsule Take 1 capsule (100 mg total) by mouth every 8 (eight) hours. Patient not taking: Reported on 11/19/2015 04/22/15   Jarrett Soho Muthersbaugh, PA-C  diphenhydramine-acetaminophen (TYLENOL PM) 25-500 MG TABS tablet Take 1 tablet by mouth at bedtime as needed.    Historical Provider, MD  HYDROcodone-acetaminophen (NORCO/VICODIN) 5-325 MG tablet Take 1-2 tablets by mouth every 6 (six) hours as needed. Patient not taking: Reported on 11/19/2015 10/19/15   Montine Circle, PA-C  predniSONE (DELTASONE) 20 MG tablet Take 3 tablets (60 mg total) by mouth daily. Patient not taking: Reported on 11/19/2015 04/22/15   Jarrett Soho Muthersbaugh, PA-C    Family History Family History  Problem Relation Age of Onset  . Sickle cell anemia Mother   . Cancer Mother   . Depression Mother   . Diabetes Other     grandparents and aunts/uncles  . Stroke Other     grandparent    Social History Social History  Substance Use Topics  . Smoking status: Current Every Day Smoker    Packs/day: 0.25    Types: Cigarettes  . Smokeless tobacco: Never Used  . Alcohol use Yes     Comment: ocassionally     Allergies   Monistat [tioconazole]; Chocolate; and Banana  Review of Systems Review of Systems  Constitutional: Negative for chills and fever.  Musculoskeletal: Positive for arthralgias and myalgias.  Skin: Negative for wound.  Neurological: Negative for weakness and numbness.     Physical Exam Updated Vital Signs BP 124/79 (BP Location: Right Arm)   Pulse 83   Temp 98.4 F (36.9 C) (Oral)   Resp 20   SpO2 96%   Physical Exam  Constitutional: She is oriented to person, place, and time. She appears well-developed and well-nourished. No distress.  HENT:  Head: Normocephalic and atraumatic.  Eyes: Conjunctivae are normal.    Cardiovascular: Normal rate.   Pulmonary/Chest: Effort normal.  Abdominal: She exhibits no distension.  Musculoskeletal:  tenderness to the left lateral joint line; Pain with ROM but does have FROM  Varus and valgus stress applied with slight laxity of the varus stress Negative anterior drawer test  No edema, deformity, ecchymosis, or wound  FROM of the left hip and left ankle  Able to bear weight on the left leg  Neurological: She is alert and oriented to person, place, and time.  Strength 5 out of 5 in lower extremities. Sensation intact sharp/20. Cap refill normal. DP pulses are 2+ bilaterally.  Skin: Skin is warm and dry.  Psychiatric: She has a normal mood and affect.  Nursing note and vitals reviewed.    ED Treatments / Results  DIAGNOSTIC STUDIES:  Oxygen Saturation is 96% on RA, normal by my interpretation.    COORDINATION OF CARE:  3:45 PM Discussed treatment plan with pt at bedside and pt agreed to plan.  Labs (all labs ordered are listed, but only abnormal results are displayed) Labs Reviewed - No data to display  EKG  EKG Interpretation None       Radiology Dg Knee Complete 4 Views Left  Result Date: 05/16/2016 CLINICAL DATA:  Acute left knee pain following twisting injury. Initial encounter. EXAM: LEFT KNEE - COMPLETE 4+ VIEW COMPARISON:  None. FINDINGS: No evidence of fracture, dislocation, or joint effusion. No evidence of arthropathy or other focal bone abnormality. Soft tissues are unremarkable. IMPRESSION: Negative. Electronically Signed   By: Margarette Canada M.D.   On: 05/16/2016 15:17    Procedures Procedures (including critical care time)  Medications Ordered in ED Medications - No data to display   Initial Impression / Assessment and Plan / ED Course  I have reviewed the triage vital signs and the nursing notes.  Pertinent labs & imaging results that were available during my care of the patient were reviewed by me and considered in my medical  decision making (see chart for details).     Patient presents to the ED with complaints of left knee pain after dancing 1.5 weeks ago. Patient able to ambulate but with limp. States pain and bruits since initial onset. She is neurovascularly intact. Strength is normal. Patient X-Ray negative for obvious fracture or dislocation.  Pt advised to follow up with orthopedics. Offered patient knee immobilizer but she would like to use a knee sleeve if she has to work. Patient given brace while in ED, conservative therapy recommended and discussed including rice therapy. Patient will be discharged home & is agreeable with above plan. Returns precautions discussed. Pt appears safe for discharge.  Final Clinical Impressions(s) / ED Diagnoses   Final diagnoses:  Acute pain of left knee    New Prescriptions New Prescriptions   No medications on file   I personally performed the services described in this documentation, which was  scribed in my presence. The recorded information has been reviewed and is accurate.     Doristine Devoid, PA-C 05/16/16 1621    Charlesetta Shanks, MD 05/16/16 [phone]

## 2016-05-16 NOTE — ED Triage Notes (Signed)
Patient states that she twisted left knee 4 days ago when dancing. States she needs brace for knee so she can stand while at work.  Pain only when she stands too long

## 2016-10-05 ENCOUNTER — Encounter (HOSPITAL_COMMUNITY): Payer: Self-pay | Admitting: Emergency Medicine

## 2016-10-05 ENCOUNTER — Emergency Department (HOSPITAL_COMMUNITY)
Admission: EM | Admit: 2016-10-05 | Discharge: 2016-10-05 | Disposition: A | Discharge status: Home / Self Care | Payer: Medicaid Other | Attending: Emergency Medicine | Admitting: Emergency Medicine

## 2016-10-05 DIAGNOSIS — F17210 Nicotine dependence, cigarettes, uncomplicated: Secondary | ICD-10-CM | POA: Insufficient documentation

## 2016-10-05 DIAGNOSIS — J45909 Unspecified asthma, uncomplicated: Secondary | ICD-10-CM | POA: Insufficient documentation

## 2016-10-05 DIAGNOSIS — N898 Other specified noninflammatory disorders of vagina: Secondary | ICD-10-CM

## 2016-10-05 DIAGNOSIS — A599 Trichomoniasis, unspecified: Secondary | ICD-10-CM

## 2016-10-05 LAB — WET PREP, GENITAL
Sperm: NONE SEEN
Yeast Wet Prep HPF POC: NONE SEEN

## 2016-10-05 LAB — POC URINE PREG, ED: Preg Test, Ur: NEGATIVE

## 2016-10-05 MED ORDER — CEFTRIAXONE SODIUM 250 MG IJ SOLR
250.0000 mg | Freq: Once | INTRAMUSCULAR | Status: AC
  Administered 2016-10-05: 250 mg via INTRAMUSCULAR
  Filled 2016-10-05: qty 250

## 2016-10-05 MED ORDER — LIDOCAINE HCL (PF) 1 % IJ SOLN
INTRAMUSCULAR | Status: AC
  Administered 2016-10-05: 0.9 mL
  Filled 2016-10-05: qty 5

## 2016-10-05 MED ORDER — AZITHROMYCIN 250 MG PO TABS
1000.0000 mg | ORAL_TABLET | Freq: Once | ORAL | Status: AC
  Administered 2016-10-05: 1000 mg via ORAL
  Filled 2016-10-05: qty 4

## 2016-10-05 MED ORDER — METRONIDAZOLE 500 MG PO TABS: 500.0000 mg | ORAL_TABLET | Freq: Two times a day (BID) | ORAL | 0 refills | Status: DC

## 2016-10-05 NOTE — ED Provider Notes (Signed)
[facility] DEPT Provider Note   CSN: [id number] Arrival date & time: 10/05/16  1249  By signing my name below, I, Ephriam Jenkins, attest that this documentation has been prepared under the direction and in the presence of [employer].  Electronically Signed: Ephriam Jenkins, ED Scribe. 10/05/16. 4:34 PM.   History   Chief Complaint Chief Complaint  Patient presents with  . Vaginal Itching    HPI HPI Comments:  Grace Montgomery is a 26 y.o. female who presents to the Emergency Department complaining of vaginal itching and discharge that started 9 days ago. Pt notes that she started experience vaginal itching first, and then noted discharge a few days after. She is sexually active with one person and does not use protection. She is not taking birth control. Pt has not taken any medications PTA. No recent abx. She denies any abdominal pain, fever, chills, nausea or vomiting.    The history is provided by the patient. No language interpreter was used.    Past Medical History:  Diagnosis Date  . Anxiety   . Asthma   . Cannabis dependence   . 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 Contraception management 08/22/2012  . Rash and nonspecific skin eruption 08/22/2012  . Obesity 06/29/2011  . Seasonal allergies 06/29/2011  . Asthma, mild persistent 06/29/2011  . Major depressive disorder 04/26/2011    Class: Acute    History reviewed. No pertinent surgical history.  OB History    Gravida Para Term Preterm AB Living   2 2 0 0 0 2   SAB TAB Ectopic Multiple Live Births   0 0 0 0         Home Medications    Prior to Admission medications   Medication Sig Start Date End Date Taking? Authorizing Provider  albuterol (PROVENTIL HFA;VENTOLIN HFA) 108 (90 Base) MCG/ACT inhaler Inhale 2 puffs into the lungs every 4 (four) hours as needed for wheezing or shortness of breath. 04/22/15    Muthersbaugh, Jarrett Soho, PA-C  amoxicillin-clavulanate (AUGMENTIN) 875-125 MG tablet Take 1 tablet by mouth every 12 (twelve) hours. Patient not taking: Reported on 11/19/2015 10/19/15   Montine Circle, PA-C  benzonatate (TESSALON) 100 MG capsule Take 1 capsule (100 mg total) by mouth every 8 (eight) hours. Patient not taking: Reported on 11/19/2015 04/22/15   Muthersbaugh, Jarrett Soho, PA-C  diphenhydramine-acetaminophen (TYLENOL PM) 25-500 MG TABS tablet Take 1 tablet by mouth at bedtime as needed.    [provider]  HYDROcodone-acetaminophen (NORCO/VICODIN) 5-325 MG tablet Take 1-2 tablets by mouth every 6 (six) hours as needed. Patient not taking: Reported on 11/19/2015 10/19/15   Montine Circle, PA-C  metroNIDAZOLE (FLAGYL) 500 MG tablet Take 1 tablet (500 mg total) by mouth 2 (two) times daily. 10/05/16   Renardo Cheatum, Dellis Filbert, PA-C  predniSONE (DELTASONE) 20 MG tablet Take 3 tablets (60 mg total) by mouth daily. Patient not taking: Reported on 11/19/2015 04/22/15   Muthersbaugh, Jarrett Soho, PA-C    Family History Family History  Problem Relation Age of Onset  . Sickle cell anemia Mother   . Cancer Mother   . Depression Mother   . Diabetes Other        grandparents and aunts/uncles  . Stroke Other        grandparent    Social History Social History  Substance Use Topics  . Smoking status: Current Every Day Smoker  Packs/day: 0.25    Types: Cigarettes  . Smokeless tobacco: Never Used  . Alcohol use Yes     Comment: ocassionally    Allergies   Monistat [tioconazole]; Chocolate; and Banana   Review of Systems Review of Systems  Constitutional: Negative for chills and fever.  Gastrointestinal: Negative for abdominal pain, nausea and vomiting.  Genitourinary: Positive for vaginal discharge. Negative for vaginal bleeding and vaginal pain.       Vaginal irritation/itching     Physical Exam Updated Vital Signs BP 120/75 (BP Location: Left Arm)   Pulse 87   Temp 99.5 F (37.5  C) (Oral)   Resp 16   LMP 09/12/2016 (Approximate)   SpO2 94%   Physical Exam  Constitutional: She is oriented to person, place, and time. She appears well-developed and well-nourished. No distress.  HENT:  Head: Normocephalic and atraumatic.  Neck: Normal range of motion.  Pulmonary/Chest: Effort normal.  Abdominal: Soft. She exhibits no distension.  Genitourinary:  Genitourinary Comments: Purulent vaginal discharge, no rashes, no cervical motion tenderness, no adnexal tenderness no vaginal bleeding  Neurological: She is alert and oriented to person, place, and time.  Skin: Skin is warm and dry. She is not diaphoretic.  Psychiatric: She has a normal mood and affect. Judgment normal.  Nursing note and vitals reviewed.    ED Treatments / Results  DIAGNOSTIC STUDIES: Oxygen Saturation is 100% on RA, normal by my interpretation.  COORDINATION OF CARE: 4:34 PM-Discussed treatment plan with pt at bedside and pt agreed to plan.   Labs (all labs ordered are listed, but only abnormal results are displayed) Labs Reviewed  WET PREP, GENITAL - Abnormal; Notable for the following:       Result Value   Trich, Wet Prep PRESENT (*)    Clue Cells Wet Prep HPF POC PRESENT (*)    WBC, Wet Prep HPF POC MANY (*)    All other components within normal limits  POC URINE PREG, ED  GC/CHLAMYDIA PROBE AMP ([place]) NOT AT [facility]    EKG  EKG Interpretation None       Radiology No results found.  Procedures Procedures (including critical care time)  Medications Ordered in ED Medications  cefTRIAXone (ROCEPHIN) injection 250 mg (250 mg Intramuscular Given 10/05/16 1720)  azithromycin (ZITHROMAX) tablet 1,000 mg (1,000 mg Oral Given 10/05/16 1720)  lidocaine (PF) (XYLOCAINE) 1 % injection (0.9 mLs  Given 10/05/16 1721)     Initial Impression / Assessment and Plan / ED Course  I have reviewed the triage vital signs and the nursing notes.  Pertinent labs & imaging results that were  available during my care of the patient were reviewed by me and considered in my medical decision making (see chart for details).     Patient presents with vaginal discharge likely secondary to trichomoniasis.  Patient treated prophylactically for gonorrhea and chlamydia given her exam.  No comp gating features, return precautions given.  Patient verbalized understanding and agreement to today's plan.  Final Clinical Impressions(s) / ED Diagnoses   Final diagnoses:  Trichimoniasis  Vaginal discharge    New Prescriptions Discharge Medication List as of 10/05/2016  5:56 PM    START taking these medications   Details  metroNIDAZOLE (FLAGYL) 500 MG tablet Take 1 tablet (500 mg total) by mouth 2 (two) times daily., Starting Tue 10/05/2016, Print       I personally performed the services described in this documentation, which was scribed in my presence. The recorded information has  been reviewed and is accurate.    Okey Regal, PA-C 10/05/16 2101    Daleen Bo, MD 10/07/16 [phone]

## 2016-10-05 NOTE — Discharge Instructions (Signed)
Please read attached information. If you experience any new or worsening signs or symptoms please return to the emergency room for evaluation. Please follow-up with your primary care provider or specialist as discussed. Please use medication prescribed only as directed and discontinue taking if you have any concerning signs or symptoms.   °

## 2016-10-05 NOTE — ED Triage Notes (Signed)
Pt comes in with vagina itching starting about a week ago. No discharge.

## 2016-10-05 NOTE — ED Notes (Signed)
Pelvic exam done by Merry Proud - PA and Dorethea Clan - EMT assisted.

## 2016-10-08 LAB — GC/CHLAMYDIA PROBE AMP (~~LOC~~) NOT AT ARMC
Chlamydia: NEGATIVE
Neisseria Gonorrhea: NEGATIVE

## 2016-11-06 DIAGNOSIS — E119 Type 2 diabetes mellitus without complications: Secondary | ICD-10-CM | POA: Diagnosis present

## 2016-11-06 DIAGNOSIS — E1165 Type 2 diabetes mellitus with hyperglycemia: Secondary | ICD-10-CM | POA: Diagnosis present

## 2016-11-08 ENCOUNTER — Encounter (HOSPITAL_COMMUNITY): Payer: Self-pay | Admitting: Emergency Medicine

## 2016-11-08 ENCOUNTER — Emergency Department (HOSPITAL_COMMUNITY)
Admission: EM | Admit: 2016-11-08 | Discharge: 2016-11-09 | Disposition: A | Discharge status: Home / Self Care | Payer: Self-pay | Attending: Emergency Medicine | Admitting: Emergency Medicine

## 2016-11-08 DIAGNOSIS — F17210 Nicotine dependence, cigarettes, uncomplicated: Secondary | ICD-10-CM | POA: Insufficient documentation

## 2016-11-08 DIAGNOSIS — F1010 Alcohol abuse, uncomplicated: Secondary | ICD-10-CM | POA: Insufficient documentation

## 2016-11-08 DIAGNOSIS — R45851 Suicidal ideations: Secondary | ICD-10-CM | POA: Insufficient documentation

## 2016-11-08 DIAGNOSIS — F329 Major depressive disorder, single episode, unspecified: Secondary | ICD-10-CM | POA: Diagnosis present

## 2016-11-08 DIAGNOSIS — J45909 Unspecified asthma, uncomplicated: Secondary | ICD-10-CM | POA: Insufficient documentation

## 2016-11-08 DIAGNOSIS — F332 Major depressive disorder, recurrent severe without psychotic features: Secondary | ICD-10-CM | POA: Insufficient documentation

## 2016-11-08 LAB — COMPREHENSIVE METABOLIC PANEL
ALBUMIN: 3.7 g/dL (ref 3.5–5.0)
ALT: 15 U/L (ref 14–54)
AST: 19 U/L (ref 15–41)
Alkaline Phosphatase: 46 U/L (ref 38–126)
Anion gap: 10 (ref 5–15)
BUN: 7 mg/dL (ref 6–20)
CHLORIDE: 106 mmol/L (ref 101–111)
CO2: 22 mmol/L (ref 22–32)
Calcium: 8.7 mg/dL — ABNORMAL LOW (ref 8.9–10.3)
Creatinine, Ser: 0.96 mg/dL (ref 0.44–1.00)
GFR calc Af Amer: >60 mL/min (ref >60)
GFR calc non Af Amer: >60 mL/min (ref >60)
GLUCOSE: 171 mg/dL — AB (ref 65–99)
POTASSIUM: 3.5 mmol/L (ref 3.5–5.1)
Sodium: 138 mmol/L (ref 135–145)
Total Bilirubin: 0.2 mg/dL — ABNORMAL LOW (ref 0.3–1.2)
Total Protein: 7 g/dL (ref 6.5–8.1)

## 2016-11-08 LAB — CBC
HCT: 36.2 % (ref 36.0–46.0)
Hemoglobin: 12 g/dL (ref 12.0–15.0)
MCH: 28.8 pg (ref 26.0–34.0)
MCHC: 33.1 g/dL (ref 30.0–36.0)
MCV: 87 fL (ref 78.0–100.0)
PLATELETS: 427 10*3/uL — AB (ref 150–400)
RBC: 4.16 MIL/uL (ref 3.87–5.11)
RDW: 16.2 % — ABNORMAL HIGH (ref 11.5–15.5)
WBC: 6.3 10*3/uL (ref 4.0–10.5)

## 2016-11-08 LAB — RAPID URINE DRUG SCREEN, HOSP PERFORMED
AMPHETAMINES: NOT DETECTED
BENZODIAZEPINES: NOT DETECTED
Barbiturates: NOT DETECTED
COCAINE: NOT DETECTED
Opiates: NOT DETECTED
Tetrahydrocannabinol: POSITIVE — AB

## 2016-11-08 LAB — SALICYLATE LEVEL: Salicylate Lvl: <7 mg/dL (ref 2.8–30.0)

## 2016-11-08 LAB — ACETAMINOPHEN LEVEL

## 2016-11-08 LAB — ETHANOL: ALCOHOL ETHYL (B): 284 mg/dL — AB (ref <5)

## 2016-11-08 LAB — POC URINE PREG, ED: PREG TEST UR: NEGATIVE

## 2016-11-08 MED ORDER — ALBUTEROL SULFATE HFA 108 (90 BASE) MCG/ACT IN AERS: 2.0000 | INHALATION_SPRAY | RESPIRATORY_TRACT | Status: DC | PRN

## 2016-11-08 MED ORDER — STERILE WATER FOR INJECTION IJ SOLN
INTRAMUSCULAR | Status: AC
Start: 2016-11-08 — End: 2016-11-08
  Administered 2016-11-08: 10 mL
  Filled 2016-11-08: qty 10

## 2016-11-08 MED ORDER — ZIPRASIDONE MESYLATE 20 MG IM SOLR
20.0000 mg | Freq: Once | INTRAMUSCULAR | Status: AC
  Administered 2016-11-08: 20 mg via INTRAMUSCULAR
  Filled 2016-11-08: qty 20

## 2016-11-08 MED ORDER — LORAZEPAM 2 MG/ML IJ SOLN
2.0000 mg | Freq: Once | INTRAMUSCULAR | Status: AC
  Administered 2016-11-08: 2 mg via INTRAMUSCULAR
  Filled 2016-11-08: qty 1

## 2016-11-08 NOTE — [facility] Assessment (Addendum)
[facility] Assessment Progress Note Case was staffed with Romilda Garret FNP who recommended patient be re-evaluated in the a.m.

## 2016-11-08 NOTE — ED Provider Notes (Signed)
[facility] DEPT Provider Note   CSN: [id number] Arrival date & time: 11/08/16  0007     History   Chief Complaint Chief Complaint  Patient presents with  . Suicidal    HPI Grace Montgomery is a 26 y.o. female presenting via EMS with suicidal ideations. Patient states that she was on the phone with her boyfriend and tried to cut her wrists but didn't do it. Her boyfriend called EMS and she was brought in. She states that she has been having suicidal ideation for the last month and doesn't know what is stopping her from doing it. She has been under a lot of stress and has just found out her boyfriend was cheating in addition to other personal stressors. She denies any hallucinations. She reports that she is otherwise feeling well physically. LMP now. Patient is tearful.  HPI  Past Medical History:  Diagnosis Date  . Anxiety   . Asthma   . Cannabis dependence   . 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 Contraception management 08/22/2012  . Rash and nonspecific skin eruption 08/22/2012  . Obesity 06/29/2011  . Seasonal allergies 06/29/2011  . Asthma, mild persistent 06/29/2011  . Major depressive disorder 04/26/2011    Class: Acute    History reviewed. No pertinent surgical history.  OB History    Gravida Para Term Preterm AB Living   2 2 0 0 0 2   SAB TAB Ectopic Multiple Live Births   0 0 0 0         Home Medications    Prior to Admission medications   Medication Sig Start Date End Date Taking? Authorizing Provider  albuterol (PROVENTIL HFA;VENTOLIN HFA) 108 (90 Base) MCG/ACT inhaler Inhale 2 puffs into the lungs every 4 (four) hours as needed for wheezing or shortness of breath. 04/22/15  Yes Muthersbaugh, Jarrett Soho, PA-C  amoxicillin-clavulanate (AUGMENTIN) 875-125 MG tablet Take 1 tablet by mouth every 12 (twelve) hours. Patient not taking: Reported on 11/19/2015 10/19/15   Montine Circle, PA-C  benzonatate (TESSALON) 100 MG capsule Take 1 capsule (100 mg total) by mouth every 8 (eight) hours. Patient not taking: Reported on 11/19/2015 04/22/15   Muthersbaugh, Jarrett Soho, PA-C  HYDROcodone-acetaminophen (NORCO/VICODIN) 5-325 MG tablet Take 1-2 tablets by mouth every 6 (six) hours as needed. Patient not taking: Reported on 11/19/2015 10/19/15   Montine Circle, PA-C  metroNIDAZOLE (FLAGYL) 500 MG tablet Take 1 tablet (500 mg total) by mouth 2 (two) times daily. Patient not taking: Reported on 11/08/2016 10/05/16   Hedges, Dellis Filbert, PA-C  predniSONE (DELTASONE) 20 MG tablet Take 3 tablets (60 mg total) by mouth daily. Patient not taking: Reported on 11/19/2015 04/22/15   Muthersbaugh, Jarrett Soho, PA-C    Family History Family History  Problem Relation Age of Onset  . Sickle cell anemia Mother   . Cancer Mother   . Depression Mother   . Diabetes Other        grandparents and aunts/uncles  . Stroke Other        grandparent    Social History Social History  Substance Use Topics  . Smoking status: Current Every Day Smoker    Packs/day: 0.25    Types: Cigarettes  . Smokeless tobacco: Never Used  . Alcohol use Yes     Comment: ocassionally     Allergies   Monistat [tioconazole]; Chocolate; and Banana   Review of Systems Review of  Systems  Constitutional: Negative for chills and fever.  HENT: Negative for ear pain and sore throat.   Eyes: Negative for pain and visual disturbance.  Respiratory: Negative for cough, shortness of breath, wheezing and stridor.   Cardiovascular: Negative for chest pain and palpitations.  Gastrointestinal: Negative for abdominal distention, abdominal pain, nausea and vomiting.  Genitourinary: Negative for difficulty urinating, dysuria, hematuria and menstrual problem.  Musculoskeletal: Negative for arthralgias, back pain, gait problem, myalgias, neck pain and neck stiffness.  Skin: Negative for color change, pallor, rash and wound.    Neurological: Negative for dizziness, tremors, seizures, syncope, weakness, light-headedness and headaches.  Psychiatric/Behavioral: Positive for suicidal ideas. Negative for hallucinations.     Physical Exam Updated Vital Signs BP (!) 99/52 (BP Location: Left Arm)   Pulse 67   Temp 98.3 F (36.8 C) (Oral)   Resp 16   Ht 5\' 3"  (1.6 m)   Wt 97.1 kg (214 lb)   LMP 11/08/2016   SpO2 100%   BMI 37.91 kg/m   Physical Exam  Constitutional: She is oriented to person, place, and time. She appears well-developed and well-nourished. No distress.  Afebrile, nontoxic-appearing, sitting comfortably in chair in no acute distress. Patient is labile throughout history taking and physical exam.  HENT:  Head: Normocephalic and atraumatic.  Mouth/Throat: Oropharynx is clear and moist. No oropharyngeal exudate.  Eyes: Pupils are equal, round, and reactive to light. Conjunctivae and EOM are normal. Right eye exhibits no discharge. Left eye exhibits no discharge.  Neck: Normal range of motion. Neck supple.  Cardiovascular: Normal rate, regular rhythm and normal heart sounds.   No murmur heard. Pulmonary/Chest: Effort normal and breath sounds normal. No respiratory distress. She has no wheezes. She has no rales.  Abdominal: Soft. She exhibits no distension. There is no tenderness.  Musculoskeletal: Normal range of motion. She exhibits no edema, tenderness or deformity.  Neurological: She is alert and oriented to person, place, and time. No sensory deficit.  ambulatory with normal stance and gait.  Skin: Skin is warm and dry. No rash noted. She is not diaphoretic. No erythema. No pallor.  No evidence of self inflicted or other injury on exam  Nursing note and vitals reviewed.    ED Treatments / Results  Labs (all labs ordered are listed, but only abnormal results are displayed) Labs Reviewed  COMPREHENSIVE METABOLIC PANEL - Abnormal; Notable for the following:       Result Value   Glucose,  Bld 171 (*)    Calcium 8.7 (*)    Total Bilirubin 0.2 (*)    All other components within normal limits  ETHANOL - Abnormal; Notable for the following:    Alcohol, Ethyl (B) 284 (*)    All other components within normal limits  ACETAMINOPHEN LEVEL - Abnormal; Notable for the following:    Acetaminophen (Tylenol), Serum <10 (*)    All other components within normal limits  CBC - Abnormal; Notable for the following:    RDW 16.2 (*)    Platelets 427 (*)    All other components within normal limits  RAPID URINE DRUG SCREEN, HOSP PERFORMED - Abnormal; Notable for the following:    Tetrahydrocannabinol POSITIVE (*)    All other components within normal limits  SALICYLATE LEVEL  POC URINE PREG, ED    EKG  EKG Interpretation None       Radiology No results found.  Procedures Procedures (including critical care time)  Medications Ordered in ED Medications  albuterol (PROVENTIL HFA;VENTOLIN  HFA) 108 (90 Base) MCG/ACT inhaler 2 puff (not administered)  LORazepam (ATIVAN) injection 2 mg (2 mg Intramuscular Given 11/08/16 0331)  ziprasidone (GEODON) injection 20 mg (20 mg Intramuscular Given 11/08/16 0331)  sterile water (preservative free) injection (10 mLs  Given 11/08/16 0331)     Initial Impression / Assessment and Plan / ED Course  I have reviewed the triage vital signs and the nursing notes.  Pertinent labs & imaging results that were available during my care of the patient were reviewed by me and considered in my medical decision making (see chart for details).  Clinical Course as of Nov 09 2034  [facility] Nov 08, 2016  0254 Patient not away and trying to leave. Reports active SI. Is very uncooperative and will not return to her room. IVC paperwork initiated.  [CH]    Clinical Course User Index [CH] Horton, Barbette Hair, MD   Patient presenting via EMS with suicidal ideation and stated that she attempted to cut her wrists. No hallucinations. No evidence of physical injury on  exam.   Labs with evidence of ETOH intoxication (284) and positive for cannabinoid. Patient was placed on Psych hold with ciwa protocol. Exam and lasb otherwise unremarkable.  She was initially cooperative on my assessment, but patient later found by staff  wandering in the hall threatening to leave and refusing to return to her room, reporting that no one evaluated her.   Patient had to be placed on IVC. Patient care transferred at end of shift to [facility], PA-C, pending clinical sobriety and TTS evaluation in the morning for disposition.  Final Clinical Impressions(s) / ED Diagnoses   Final diagnoses:  Suicidal ideation  Alcohol abuse    New Prescriptions New Prescriptions   No medications on file     Dossie Der 11/08/16 2038    Merryl Hacker, MD 11/10/16 [phone]

## 2016-11-08 NOTE — ED Notes (Signed)
Bed: WTR7 Expected date:  Expected time:  Means of arrival:  Comments: 

## 2016-11-08 NOTE — ED Notes (Signed)
Pts belongings under cabinet in triage

## 2016-11-08 NOTE — ED Notes (Signed)
Bed: WHALD Expected date:  Expected time:  Means of arrival:  Comments: 

## 2016-11-08 NOTE — BH Assessment (Addendum)
Assessment Note  Grace Montgomery is an 26 y.o. female that presents this date with thoughts of self harm but will not elaborate on plan/intent. Patient per notes, was IVCed on admission due to being aggressive, impaired and combative earlier this date. Per IVC, patient admits to active S/I on admission and "has a plan to end her life for the last month." Patient is partially impaired at the time of assessment and renders limited information. Patient reports current alcohol use stating she consumes three to four 12 oz beers "every other day" with last reported use on 11/07/16 when patient reported she drank a unknown amount of beers and "some liquor." Patient denies any other illicit SA use. Patient stated she has recently been under a lot of stress with her electricity being disconnected on 11/06/16. Patient states she resides alone and has been depressed lately with symptoms worsening in the last two weeks to include: feeling hopeless and guilt over not being able to provide for herself. Patient denies any previous/attempts gestures at self harm although per note review patient was admitted to [facility] in 2013 for a accidental overdose. Patient denies having any current OP providers or being on any MH medication regimen. Patient is oriented to place only and speaks in a slurred slow voice. Patient is drowsy and renders limited history. Per note review patient presents by EMS for evaluation of SI. Pt currently denies a plan to harm self at this time. Pt reports drinking alcohol for the last 48 hours. Patient is a poor historian as this Probation officer attempts to ask assessment questions several times before a response is given. There were parts of the assessment that were unable to be obtained due to patient being impaired. Case was staffed with Romilda Garret FNP who recommended patient be re-evaluated in the a.m.     Diagnosis: MDD recurrent without psychotic features, moderate/ Alcohol abuse  Past Medical History:  Past Medical  History:  Diagnosis Date  . Anxiety   . Asthma   . Cannabis dependence   . 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 No pertinent surgical history.  Family History:  Family History  Problem Relation Age of Onset  . Sickle cell anemia Mother   . Cancer Mother   . Depression Mother   . Diabetes Other        grandparents and aunts/uncles  . Stroke Other        grandparent    Social History:  reports that she has been smoking Cigarettes.  She has been smoking about 0.25 packs per day. She has never used smokeless tobacco. She reports that she drinks alcohol. She reports that she does not use drugs.  Additional Social History:  Alcohol / Drug Use Pain Medications: none noted Prescriptions: none noted Over the Counter: none noted History of alcohol / drug use?: Yes Longest period of sobriety (when/how long): Unknown Negative Consequences of Use:  (Pt denies) Withdrawal Symptoms:  (Pt denies) Substance #1 Name of Substance 1: Alcohol 1 - Age of First Use: 17 1 - Amount (size/oz): UTA 1 - Frequency: UTA 1 - Duration: UTA 1 - Last Use / Amount: 11/07/16 Unknown amount    CIWA: CIWA-Ar BP: 97/68 Pulse Rate: 68 COWS:    Allergies:  Allergies  Allergen Reactions  . Monistat [Tioconazole] Swelling  . Chocolate Itching and Other (See Comments)    Ears & throat itch  . Banana Itching and Other (See Comments)  Throat itching    Home Medications:  (Not in a hospital admission)  OB/GYN Status:  Patient's last menstrual period was 11/08/2016.  General Assessment Data Assessment unable to be completed: Yes Reason for not completing assessment: Pt given geodon and ativan.  [facility] of Assessment: [facility] TTS Assessment: In system Is this a Tele or Face-to-Face Assessment?: Face-to-Face Is this an Initial Assessment or a Re-assessment for this encounter?: Initial Assessment Marital status: Single Maiden name: NA Is patient  pregnant?: Unknown Pregnancy Status: Unknown Living Arrangements: Children Can pt return to current living arrangement?: Yes Admission Status: Voluntary Is patient capable of signing voluntary admission?: Yes Referral Source: Self/Family/Friend Insurance type: Medicaid  Medical Screening Exam ([place]) Medical Exam completed: Yes  Crisis Care Plan Living Arrangements: Children Legal Guardian:  (NA) Name of Psychiatrist: None Name of Therapist: None  Education Status Is patient currently in school?: No Current Grade:  (NA) Highest grade of school patient has completed:  (12th) Name of school:  (NA) Contact person:  (NA)  Risk to self with the past 6 months Suicidal Ideation: Yes-Currently Present Has patient been a risk to self within the past 6 months prior to admission? : No Suicidal Intent: No Has patient had any suicidal intent within the past 6 months prior to admission? : No Is patient at risk for suicide?: Yes Suicidal Plan?: No Has patient had any suicidal plan within the past 6 months prior to admission? : No Access to Means: No What has been your use of drugs/alcohol within the last 12 months?: Current use Previous Attempts/Gestures: No How many times?: 0 Other Self Harm Risks: NA Triggers for Past Attempts: Unknown Intentional Self Injurious Behavior: None Family Suicide History: No Recent stressful life event(s): Other (Comment) (Relationship issues) Persecutory voices/beliefs?: No Depression: Yes Depression Symptoms: Feeling worthless/self pity Substance abuse history and/or treatment for substance abuse?: Yes Suicide prevention information given to non-admitted patients: Not applicable  Risk to Others within the past 6 months Homicidal Ideation: No Does patient have any lifetime risk of violence toward others beyond the six months prior to admission? : No Thoughts of Harm to Others: No Current Homicidal Intent: No Current Homicidal Plan:  No Access to Homicidal Means: No Identified Victim: NA History of harm to others?: No Assessment of Violence: None Noted Violent Behavior Description: NA Does patient have access to weapons?: No Criminal Charges Pending?: No Does patient have a court date: No Is patient on probation?: No  Psychosis Hallucinations: None noted Delusions: None noted  Mental Status Report Appearance/Hygiene: In scrubs Eye Contact: Poor Motor Activity: Unremarkable Speech: Slow, Slurred Level of Consciousness: Drowsy Mood: Depressed Affect: Flat Anxiety Level: Minimal Thought Processes: Unable to Assess Judgement: Partial Orientation: Place Obsessive Compulsive Thoughts/Behaviors: None  Cognitive Functioning Concentration: Decreased Memory: Recent Intact IQ: Average Insight: Poor Impulse Control: Fair Appetite:  (UTA) Weight Loss:  (UTA) Weight Gain:  (UTA) Sleep:  (UTA) Total Hours of Sleep:  (UTA) Vegetative Symptoms: None  ADLScreening [facility] Assessment Services) Patient's cognitive ability adequate to safely complete daily activities?: Yes Patient able to express need for assistance with ADLs?: Yes Independently performs ADLs?: Yes (appropriate for developmental age)  Prior Inpatient Therapy Prior Inpatient Therapy: Yes Prior Therapy Dates: 2013 Prior Therapy Facilty/Provider(s): [facility] Reason for Treatment: SA issues  Prior Outpatient Therapy Prior Outpatient Therapy: Yes Prior Therapy Dates:  [facility]) Prior Therapy Facilty/Provider(s): [facility] Reason for Treatment: SA issues Does patient have an ACCT team?: No Does patient have Intensive In-House Services?  :  No Does patient have [facility] services? : Unknown Does patient have P4CC services?: No  ADL Screening (condition at time of admission) Patient's cognitive ability adequate to safely complete daily activities?: Yes Is the patient deaf or have difficulty hearing?: No Does the patient have difficulty seeing, even  when wearing glasses/contacts?: No Does the patient have difficulty concentrating, remembering, or making decisions?: No Patient able to express need for assistance with ADLs?: Yes Does the patient have difficulty dressing or bathing?: No Independently performs ADLs?: Yes (appropriate for developmental age) Does the patient have difficulty walking or climbing stairs?: No Weakness of Legs: None Weakness of Arms/Hands: None  Home Assistive Devices/Equipment Home Assistive Devices/Equipment: None  Therapy Consults (therapy consults require a physician order) PT Evaluation Needed: No OT Evalulation Needed: No SLP Evaluation Needed: No Abuse/Neglect Assessment (Assessment to be complete while patient is alone) Physical Abuse: Denies Verbal Abuse: Denies Sexual Abuse: Denies Exploitation of patient/patient's resources: Denies Self-Neglect: Denies Values / Beliefs Cultural Requests During Hospitalization: None Spiritual Requests During Hospitalization: None Consults Spiritual Care Consult Needed: No Social Work Consult Needed: No Regulatory affairs officer (For Healthcare) Does Patient Have a Medical Advance Directive?: No Would patient like information on creating a medical advance directive?: No - Patient declined    Additional Information 1:1 In Past 12 Months?: No CIRT Risk: No Elopement Risk: No Does patient have medical clearance?: Yes     Disposition: Case was staffed with Romilda Garret FNP who recommended patient be re-evaluated in the a.m.    Disposition Initial Assessment Completed for this Encounter: Yes Disposition of Patient: Other dispositions Other disposition(s): Other (Comment) (Re-evaluate in the a.m.)  On Site Evaluation by:   Reviewed with Physician:    Mamie Nick 11/08/2016 12:02 PM

## 2016-11-08 NOTE — ED Notes (Signed)
Pt oriented to room and unit.  Patient has very flat affect and doesn't show much interest in talking.  She was given some juice upon arrival.  She contracts for safety.  Pt is withdrawn and irritable.  15 minute checks and video monitoring in place.

## 2016-11-08 NOTE — Progress Notes (Signed)
Met with pt 1:1.  Presents with depressed affect and mood and reports she is "tired."  She forwards little information to Probation officer.  Denies SI/HI, hallucinations, withdrawal symptoms, and pain.  Pt reports she will inform staff of needs and concerns.  Pt is safe on the unit and she verbally contracts for safety.  Will continue to monitor and assess.

## 2016-11-08 NOTE — ED Notes (Signed)
Bed: [facility] Expected date:  Expected time:  Means of arrival:  Comments: 

## 2016-11-08 NOTE — ED Triage Notes (Signed)
Pt presents by EMS for evaluation of SI. Pt currently denies a plan to harm self at this time. Pt reports drinking alcohol for the last 48 hours.

## 2016-11-08 NOTE — ED Notes (Signed)
Pt stating that she can't stay in the room. Dr.Horton speaking with patient at this time and informing pt about plan of care.

## 2016-11-08 NOTE — ED Provider Notes (Signed)
12:00 noon.  Pt awake.  She is agreeable to talking to TTS.  Pt reports she still has suicidal thoughts. TTS will continue to observe.     Grace Montgomery 11/08/16 [place], MD 11/09/16 [phone]

## 2016-11-08 NOTE — B[facility] Counselor (Signed)
At 0401: Per pt's nurse, pt has been given Geodon and Ativan and is not able to participate at this time. Per nurse, Consult will be resubmitted when pt is able to be assessed.  Vertell Novak, MS, [facility], [facility] Triage Specialist [phone]

## 2016-11-09 DIAGNOSIS — Z818 Family history of other mental and behavioral disorders: Secondary | ICD-10-CM

## 2016-11-09 DIAGNOSIS — R45851 Suicidal ideations: Secondary | ICD-10-CM

## 2016-11-09 DIAGNOSIS — Z560 Unemployment, unspecified: Secondary | ICD-10-CM

## 2016-11-09 DIAGNOSIS — F329 Major depressive disorder, single episode, unspecified: Secondary | ICD-10-CM

## 2016-11-09 DIAGNOSIS — F17210 Nicotine dependence, cigarettes, uncomplicated: Secondary | ICD-10-CM

## 2016-11-09 DIAGNOSIS — Y908 Blood alcohol level of 240 mg/100 ml or more: Secondary | ICD-10-CM

## 2016-11-09 DIAGNOSIS — F10129 Alcohol abuse with intoxication, unspecified: Secondary | ICD-10-CM

## 2016-11-09 MED ORDER — FLUOXETINE HCL 10 MG PO CAPS: 10.0000 mg | ORAL_CAPSULE | Freq: Every day | ORAL | Status: DC

## 2016-11-09 MED ORDER — FLUOXETINE HCL 10 MG PO CAPS
10.0000 mg | ORAL_CAPSULE | Freq: Every day | ORAL | Status: DC
  Administered 2016-11-09: 10 mg via ORAL
  Filled 2016-11-09: qty 1

## 2016-11-09 NOTE — BH Assessment (Deleted)
[facility] Assessment Progress Note  For your ongoing mental health needs, you are advised to follow up with [facility].  New and returning patients are seen at their walk-in clinic.  Walk-in hours are Monday - Friday from 8:00 am - 3:00 pm.  Walk-in patients are seen on a first come, first served basis.  Try to arrive as early as possible for he best chance of being seen the same day:       [facility]      [address]      [place], [place] [address]      [phone]

## 2016-11-09 NOTE — Discharge Instructions (Signed)
For your ongoing mental health needs, you are advised to follow up with [facility].  New and returning patients are seen at their walk-in clinic.  Walk-in hours are Monday - Friday from 8:00 am - 3:00 pm.  Walk-in patients are seen on a first come, first served basis.  Try to arrive as early as possible for he best chance of being seen the same day: ° °     [facility] °     [address] °     [place], [place] [address] °     [phone] °

## 2016-11-09 NOTE — BH Assessment (Signed)
[facility] Assessment Progress Note  Per Corena Pilgrim, MD, this pt does not require psychiatric hospitalization at this time.  Pt presents under IVC initiate EDP Thayer Jew, MD, which Dr Darleene Cleaver has rescinded.  Pt is to be discharged from [facility] with recommendation to follow up with [facility].  This has been included in pt's discharge instructions.  Pt's nurse, Diane, has been notified.  Jalene Mullet, [place] Triage Specialist [phone]

## 2016-11-09 NOTE — ED Notes (Signed)
Introduced self to patient. Pt oriented to unit expectations.  Assessed pt for:  A) Anxiety &/or agitation: Pt reports feeling better today and would like to go home. Denies SI/HI/AVH. Pt has been calm and cooperative.   S) Safety: Safety maintained with q-15-minute checks and hourly rounds by staff.  A) ADLs: Pt able to perform ADLs independently.  P) Pick-Up (room cleanliness): Pt's room clean and free of clutter.

## 2016-11-09 NOTE — ED Notes (Signed)
Pt discharged home. Discharged instructions read to pt who verbalized understanding. All belongings returned to pt who signed for same. Denies SI/HI, is not delusional and not responding to internal stimuli. Escorted pt to the ED exit.    

## 2016-11-09 NOTE — [facility] Suicide Risk Assessment (Signed)
Suicide Risk Assessment  Discharge Assessment   [facility] Discharge Suicide Risk Assessment   Principal Problem: Major depressive disorder Discharge Diagnoses:  Patient Active Problem List   Diagnosis Date Noted  . Contraception management [Z30.9] 08/22/2012  . Rash and nonspecific skin eruption [R21] 08/22/2012  . Obesity [E66.9] 06/29/2011  . Seasonal allergies [J30.2] 06/29/2011  . Asthma, mild persistent [J45.30] 06/29/2011  . Major depressive disorder [F32.9] 04/26/2011    Class: Acute    Total Time spent with patient: 45 minutes  Musculoskeletal: Strength & Muscle Tone: within normal limits Gait & Station: normal Patient leans: N/A  Psychiatric Specialty Exam: Physical Exam  Constitutional: She is oriented to person, place, and time. She appears well-developed and well-nourished.  Respiratory: Effort normal.  Musculoskeletal: Normal range of motion.  Neurological: She is alert and oriented to person, place, and time.  Psychiatric: Her speech is normal and behavior is normal. Thought content normal. Cognition and memory are normal. She expresses impulsivity. She exhibits a depressed mood.   Review of Systems  Psychiatric/Behavioral: Positive for depression and substance abuse. Negative for hallucinations, memory loss and suicidal ideas. The patient is not nervous/anxious and does not have insomnia.   All other systems reviewed and are negative.  Blood pressure 115/70, pulse 63, temperature 97.9 F (36.6 C), temperature source Oral, resp. rate 18, height 5\' 3"  (1.6 m), weight 97.1 kg (214 lb), last menstrual period 11/08/2016, SpO2 100 %.Body mass index is 37.91 kg/m. General Appearance: Casual Eye Contact:  Good Speech:  Clear and Coherent Volume:  Decreased Mood:  Depressed Affect:  Congruent and Depressed Thought Process:  Coherent and Linear Orientation:  Full (Time, Place, and Person) Thought Content:  Logical Suicidal Thoughts:  No Homicidal Thoughts:   No Memory:  Immediate;   Good Recent;   Good Remote;   Fair Judgement:  Fair Insight:  Fair Psychomotor Activity:  Normal Concentration:  Concentration: Good and Attention Span: Good Recall:  Good Fund of Knowledge:  Good Language:  Good Akathisia:  No Handed:  Right AIMS (if indicated):    Assets:  Agricultural consultant Housing Resilience Social Support ADL's:  Intact Cognition:  WNL  Mental Status Per Nursing Assessment::   On Admission:   Depressed, suicidal ideation and intoxicated  Demographic Factors:  Adolescent or young adult, Low socioeconomic status and Unemployed  Loss Factors: Financial problems/change in socioeconomic status  Historical Factors: Prior suicide attempts and Impulsivity  Risk Reduction Factors:   Responsible for children under 32 years of age, Sense of responsibility to family and Living with another person, especially a relative  Continued Clinical Symptoms:  Depression:   Impulsivity Alcohol/Substance Abuse/Dependencies More than one psychiatric diagnosis  Cognitive Features That Contribute To Risk:  Closed-mindedness    Suicide Risk:  Minimal: No identifiable suicidal ideation.  Patients presenting with no risk factors but with morbid ruminations; may be classified as minimal risk based on the severity of the depressive symptoms    Plan Of Care/Follow-up recommendations:  Activity:  as tloerated Diet:  Heart healthy  Ethelene Hal, NP 11/09/2016, 11:50 AM

## 2016-11-09 NOTE — Consult Note (Signed)
[facility] Psychiatry Consult   Reason for Consult:  Suicidal ideation Referring Physician:  EDP Patient Identification: Grace Montgomery MRN:  [id number] Principal Diagnosis: Major depressive disorder Diagnosis:   Patient Active Problem List   Diagnosis Date Noted  . Contraception management [Z30.9] 08/22/2012  . Rash and nonspecific skin eruption [R21] 08/22/2012  . Obesity [E66.9] 06/29/2011  . Seasonal allergies [J30.2] 06/29/2011  . Asthma, mild persistent [J45.30] 06/29/2011  . Major depressive disorder [F32.9] 04/26/2011    Class: Acute    Total Time spent with patient: 45 minutes  Subjective:   Grace Montgomery is a 26 y.o. female patient admitted with suicidal ideation and intoxication.  HPI:  Pt was seen and chart reviewed with Dr Darleene Cleaver. Pt presented to the [facility] under IVC for suicidal ideation without a plan. Pt's BAL 284 on admission, UDS positive for THC. Today,  Pt denies suicidal/homicidal ideation, denies auditory/visual hallucinations and does not appear to be responding to internal stimuli. Pt was calm and cooperative, alert & oriented x 4, and appropriate for the situation. Pt stated she had a mental breakdown on Sunday because she lost her job and drank too much as a result. Pt denies daily alcohol use. Pt stated she wants help for her depression and needs to go home so she can take care of her children. Pt was diagnosed with MDD 6 yrs ago but is currently not in therapy or on any medications. Pt is willing to seek outpatient help. Pt is psychiatrically clear for discharge and follow up at [place].   Past Psychiatric History: As above  Risk to Self: None Risk to Others: None Prior Inpatient Therapy: Prior Inpatient Therapy: Yes Prior Therapy Dates: 2013 Prior Therapy Facilty/Provider(s): [facility] Reason for Treatment: SA issues Prior Outpatient Therapy: Prior Outpatient Therapy: Yes Prior Therapy Dates:  (UTA) Prior Therapy  Facilty/Provider(s): [facility] Reason for Treatment: SA issues Does patient have an ACCT team?: No Does patient have Intensive In-House Services?  : No Does patient have [facility] services? : Unknown Does patient have P4CC services?: No  Past Medical History:  Past Medical History:  Diagnosis Date  . Anxiety   . Asthma   . Cannabis dependence   . 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 No pertinent surgical history. Family History:  Family History  Problem Relation Age of Onset  . Sickle cell anemia Mother   . Cancer Mother   . Depression Mother   . Diabetes Other        grandparents and aunts/uncles  . Stroke Other        grandparent   Family Psychiatric  History: Unknown Social History:  History  Alcohol Use  . Yes    Comment: ocassionally     History  Drug Use No    Comment: History of Marijuana    Social History   Social History  . Marital status: Single    Spouse name: N/A  . Number of children: N/A  . Years of education: N/A   Social History Main Topics  . Smoking status: Current Every Day Smoker    Packs/day: 0.25    Types: Cigarettes  . Smokeless tobacco: Never Used  . Alcohol use Yes     Comment: ocassionally  . Drug use: No     Comment: History of Marijuana  . Sexual activity: Not Asked   Other Topics Concern  . None   Social History Narrative  Student for psychotherapy   2 Children - Boy(4yo) & Girl (3yo)         Additional Social History:    Allergies:   Allergies  Allergen Reactions  . Monistat [Tioconazole] Swelling  . Chocolate Itching and Other (See Comments)    Ears & throat itch  . Banana Itching and Other (See Comments)    Throat itching    Labs:  Results for orders placed or performed during the hospital encounter of 11/08/16 (from the past 48 hour(s))  Rapid urine drug screen (hospital performed)     Status: Abnormal   Collection Time: 11/08/16 12:50 AM  Result Value Ref  Range   Opiates NONE DETECTED NONE DETECTED   Cocaine NONE DETECTED NONE DETECTED   Benzodiazepines NONE DETECTED NONE DETECTED   Amphetamines NONE DETECTED NONE DETECTED   Tetrahydrocannabinol POSITIVE (A) NONE DETECTED   Barbiturates NONE DETECTED NONE DETECTED    Comment:        DRUG SCREEN FOR MEDICAL PURPOSES ONLY.  IF CONFIRMATION IS NEEDED FOR ANY PURPOSE, NOTIFY LAB WITHIN 5 DAYS.        LOWEST DETECTABLE LIMITS FOR URINE DRUG SCREEN Drug Class       Cutoff (ng/mL) Amphetamine      1000 Barbiturate      200 Benzodiazepine   937 Tricyclics       342 Opiates          300 Cocaine          300 THC              50   POC urine preg, ED     Status: None   Collection Time: 11/08/16  1:06 AM  Result Value Ref Range   Preg Test, Ur NEGATIVE NEGATIVE    Comment:        THE SENSITIVITY OF THIS METHODOLOGY IS >24 mIU/mL   Comprehensive metabolic panel     Status: Abnormal   Collection Time: 11/08/16  1:13 AM  Result Value Ref Range   Sodium 138 135 - 145 mmol/L   Potassium 3.5 3.5 - 5.1 mmol/L   Chloride 106 101 - 111 mmol/L   CO2 22 22 - 32 mmol/L   Glucose, Bld 171 (H) 65 - 99 mg/dL   BUN 7 6 - 20 mg/dL   Creatinine, Ser 0.96 0.44 - 1.00 mg/dL   Calcium 8.7 (L) 8.9 - 10.3 mg/dL   Total Protein 7.0 6.5 - 8.1 g/dL   Albumin 3.7 3.5 - 5.0 g/dL   AST 19 15 - 41 U/L   ALT 15 14 - 54 U/L   Alkaline Phosphatase 46 38 - 126 U/L   Total Bilirubin 0.2 (L) 0.3 - 1.2 mg/dL   GFR calc non Af Amer >60 >60 mL/min   GFR calc Af Amer >60 >60 mL/min    Comment: (NOTE) The eGFR has been calculated using the CKD EPI equation. This calculation has not been validated in all clinical situations. eGFR's persistently <60 mL/min signify possible Chronic Kidney Disease.    Anion gap 10 5 - 15  Ethanol     Status: Abnormal   Collection Time: 11/08/16  1:13 AM  Result Value Ref Range   Alcohol, Ethyl (B) 284 (H) <5 mg/dL    Comment:        LOWEST DETECTABLE LIMIT FOR SERUM ALCOHOL IS  5 mg/dL FOR MEDICAL PURPOSES ONLY   Salicylate level     Status: None   Collection Time: 11/08/16  1:13 AM  Result Value Ref Range   Salicylate Lvl <8.3 2.8 - 30.0 mg/dL  Acetaminophen level     Status: Abnormal   Collection Time: 11/08/16  1:13 AM  Result Value Ref Range   Acetaminophen (Tylenol), Serum <10 (L) 10 - 30 ug/mL    Comment:        THERAPEUTIC CONCENTRATIONS VARY SIGNIFICANTLY. A RANGE OF 10-30 ug/mL MAY BE AN EFFECTIVE CONCENTRATION FOR MANY PATIENTS. HOWEVER, SOME ARE BEST TREATED AT CONCENTRATIONS OUTSIDE THIS RANGE. ACETAMINOPHEN CONCENTRATIONS >150 ug/mL AT 4 HOURS AFTER INGESTION AND >50 ug/mL AT 12 HOURS AFTER INGESTION ARE OFTEN ASSOCIATED WITH TOXIC REACTIONS.   cbc     Status: Abnormal   Collection Time: 11/08/16  1:13 AM  Result Value Ref Range   WBC 6.3 4.0 - 10.5 K/uL   RBC 4.16 3.87 - 5.11 MIL/uL   Hemoglobin 12.0 12.0 - 15.0 g/dL   HCT 36.2 36.0 - 46.0 %   MCV 87.0 78.0 - 100.0 fL   MCH 28.8 26.0 - 34.0 pg   MCHC 33.1 30.0 - 36.0 g/dL   RDW 16.2 (H) 11.5 - 15.5 %   Platelets 427 (H) 150 - 400 K/uL    Current Facility-Administered Medications  Medication Dose Route Frequency Provider Last Rate Last Dose  . albuterol (PROVENTIL HFA;VENTOLIN HFA) 108 (90 Base) MCG/ACT inhaler 2 puff  2 puff Inhalation Q4H PRN Avie Echevaria B, PA-C      . FLUoxetine (PROZAC) capsule 10 mg  10 mg Oral Daily Corena Pilgrim, MD       Current Outpatient Prescriptions  Medication Sig Dispense Refill  . albuterol (PROVENTIL HFA;VENTOLIN HFA) 108 (90 Base) MCG/ACT inhaler Inhale 2 puffs into the lungs every 4 (four) hours as needed for wheezing or shortness of breath. 1 Inhaler 3  . amoxicillin-clavulanate (AUGMENTIN) 875-125 MG tablet Take 1 tablet by mouth every 12 (twelve) hours. (Patient not taking: Reported on 11/19/2015) 14 tablet 0  . benzonatate (TESSALON) 100 MG capsule Take 1 capsule (100 mg total) by mouth every 8 (eight) hours. (Patient not taking:  Reported on 11/19/2015) 21 capsule 0  . HYDROcodone-acetaminophen (NORCO/VICODIN) 5-325 MG tablet Take 1-2 tablets by mouth every 6 (six) hours as needed. (Patient not taking: Reported on 11/19/2015) 10 tablet 0  . metroNIDAZOLE (FLAGYL) 500 MG tablet Take 1 tablet (500 mg total) by mouth 2 (two) times daily. (Patient not taking: Reported on 11/08/2016) 14 tablet 0  . predniSONE (DELTASONE) 20 MG tablet Take 3 tablets (60 mg total) by mouth daily. (Patient not taking: Reported on 11/19/2015) 12 tablet 0    Musculoskeletal: Strength & Muscle Tone: within normal limits Gait & Station: normal Patient leans: N/A  Psychiatric Specialty Exam: Physical Exam  Constitutional: She is oriented to person, place, and time. She appears well-developed and well-nourished.  Respiratory: Effort normal.  Musculoskeletal: Normal range of motion.  Neurological: She is alert and oriented to person, place, and time.  Psychiatric: Her speech is normal and behavior is normal. Thought content normal. Cognition and memory are normal. She expresses impulsivity. She exhibits a depressed mood.    Review of Systems  Psychiatric/Behavioral: Positive for depression and substance abuse. Negative for hallucinations, memory loss and suicidal ideas. The patient is not nervous/anxious and does not have insomnia.   All other systems reviewed and are negative.   Blood pressure 115/70, pulse 63, temperature 97.9 F (36.6 C), temperature source Oral, resp. rate 18, height _0  (1.6 m), weight 97.1 kg (214 lb),  last menstrual period 11/08/2016, SpO2 100 %.Body mass index is 37.91 kg/m.  General Appearance: Casual  Eye Contact:  Good  Speech:  Clear and Coherent  Volume:  Decreased  Mood:  Depressed  Affect:  Congruent and Depressed  Thought Process:  Coherent and Linear  Orientation:  Full (Time, Place, and Person)  Thought Content:  Logical  Suicidal Thoughts:  No  Homicidal Thoughts:  No  Memory:  Immediate;   Good Recent;    Good Remote;   Fair  Judgement:  Fair  Insight:  Fair  Psychomotor Activity:  Normal  Concentration:  Concentration: Good and Attention Span: Good  Recall:  Good  Fund of Knowledge:  Good  Language:  Good  Akathisia:  No  Handed:  Right  AIMS (if indicated):     Assets:  Agricultural consultant Housing Resilience Social Support  ADL's:  Intact  Cognition:  WNL  Sleep:        Treatment Plan Summary: Plan Major Depressive Disorder  Discharge Home Follow up with [facility] [place] for therapy and medication management Avoid the use of alcohol and illicit drugs Take medications as prescribed  Disposition: No evidence of imminent risk to self or others at present.   Patient does not meet criteria for psychiatric inpatient admission. Supportive therapy provided about ongoing stressors. Discussed crisis plan, support from social network, calling 911, coming to the Emergency Department, and calling Suicide Hotline.  Ethelene Hal, NP 11/09/2016 11:36 AM  Patient seen face-to-face for psychiatric evaluation, chart reviewed and case discussed with the physician extender and developed treatment plan. Reviewed the information documented and agree with the treatment plan. Corena Pilgrim, MD

## 2016-12-25 ENCOUNTER — Emergency Department (HOSPITAL_COMMUNITY)
Admission: EM | Admit: 2016-12-25 | Discharge: 2016-12-25 | Disposition: A | Discharge status: Home / Self Care | Payer: Self-pay | Attending: Emergency Medicine | Admitting: Emergency Medicine

## 2016-12-25 ENCOUNTER — Encounter (HOSPITAL_COMMUNITY): Payer: Self-pay

## 2016-12-25 DIAGNOSIS — A599 Trichomoniasis, unspecified: Secondary | ICD-10-CM

## 2016-12-25 DIAGNOSIS — F17210 Nicotine dependence, cigarettes, uncomplicated: Secondary | ICD-10-CM | POA: Insufficient documentation

## 2016-12-25 DIAGNOSIS — Z79899 Other long term (current) drug therapy: Secondary | ICD-10-CM | POA: Insufficient documentation

## 2016-12-25 DIAGNOSIS — N898 Other specified noninflammatory disorders of vagina: Secondary | ICD-10-CM

## 2016-12-25 DIAGNOSIS — N72 Inflammatory disease of cervix uteri: Secondary | ICD-10-CM | POA: Insufficient documentation

## 2016-12-25 DIAGNOSIS — A5900 Urogenital trichomoniasis, unspecified: Secondary | ICD-10-CM | POA: Insufficient documentation

## 2016-12-25 DIAGNOSIS — J45909 Unspecified asthma, uncomplicated: Secondary | ICD-10-CM | POA: Insufficient documentation

## 2016-12-25 LAB — WET PREP, GENITAL
Clue Cells Wet Prep HPF POC: NONE SEEN
Sperm: NONE SEEN
Yeast Wet Prep HPF POC: NONE SEEN

## 2016-12-25 LAB — POC URINE PREG, ED: PREG TEST UR: NEGATIVE

## 2016-12-25 MED ORDER — AZITHROMYCIN 250 MG PO TABS
1000.0000 mg | ORAL_TABLET | Freq: Once | ORAL | Status: AC
  Administered 2016-12-25: 1000 mg via ORAL
  Filled 2016-12-25: qty 4

## 2016-12-25 MED ORDER — STERILE WATER FOR INJECTION IJ SOLN
INTRAMUSCULAR | Status: AC
  Administered 2016-12-25: 0.9 mL
  Filled 2016-12-25: qty 10

## 2016-12-25 MED ORDER — CEFTRIAXONE SODIUM 250 MG IJ SOLR
250.0000 mg | Freq: Once | INTRAMUSCULAR | Status: AC
  Administered 2016-12-25: 250 mg via INTRAMUSCULAR
  Filled 2016-12-25: qty 250

## 2016-12-25 MED ORDER — METRONIDAZOLE 500 MG PO TABS
2000.0000 mg | ORAL_TABLET | Freq: Once | ORAL | Status: AC
  Administered 2016-12-25: 2000 mg via ORAL
  Filled 2016-12-25: qty 4

## 2016-12-25 NOTE — ED Triage Notes (Signed)
Pt presents with 2 day h/o vaginal itching.  Pt denies any abdominal pain, denies any dysuria.

## 2016-12-25 NOTE — ED Notes (Signed)
Walked patient to the bathroom patient did well 

## 2016-12-25 NOTE — ED Provider Notes (Signed)
[place] EMERGENCY DEPARTMENT Provider Note   CSN: [id number] Arrival date & time: 12/25/16  1428     History   Chief Complaint No chief complaint on file.   HPI Grace Montgomery is a 26 y.o. female.  The history is provided by the patient and medical records. No language interpreter was used.  Vaginal Itching  This is a recurrent problem. The current episode started yesterday. The problem occurs constantly. The problem has not changed since onset.Pertinent negatives include no chest pain, no abdominal pain, no headaches and no shortness of breath. Nothing aggravates the symptoms. Nothing relieves the symptoms. She has tried nothing for the symptoms. The treatment provided no relief.    Past Medical History:  Diagnosis Date  . Anxiety   . Asthma   . Cannabis dependence   . 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 Contraception management 08/22/2012  . Rash and nonspecific skin eruption 08/22/2012  . Obesity 06/29/2011  . Seasonal allergies 06/29/2011  . Asthma, mild persistent 06/29/2011  . Major depressive disorder 04/26/2011    Class: Acute    History reviewed. No pertinent surgical history.  OB History    Gravida Para Term Preterm AB Living   2 2 0 0 0 2   SAB TAB Ectopic Multiple Live Births   0 0 0 0         Home Medications    Prior to Admission medications   Medication Sig Start Date End Date Taking? Authorizing Provider  albuterol (PROVENTIL HFA;VENTOLIN HFA) 108 (90 Base) MCG/ACT inhaler Inhale 2 puffs into the lungs every 4 (four) hours as needed for wheezing or shortness of breath. 04/22/15   Muthersbaugh, Jarrett Soho, PA-C  amoxicillin-clavulanate (AUGMENTIN) 875-125 MG tablet Take 1 tablet by mouth every 12 (twelve) hours. Patient not taking: Reported on 11/19/2015 10/19/15   Montine Circle, PA-C  benzonatate (TESSALON) 100 MG capsule Take 1 capsule (100  mg total) by mouth every 8 (eight) hours. Patient not taking: Reported on 11/19/2015 04/22/15   Muthersbaugh, Jarrett Soho, PA-C  HYDROcodone-acetaminophen (NORCO/VICODIN) 5-325 MG tablet Take 1-2 tablets by mouth every 6 (six) hours as needed. Patient not taking: Reported on 11/19/2015 10/19/15   Montine Circle, PA-C  metroNIDAZOLE (FLAGYL) 500 MG tablet Take 1 tablet (500 mg total) by mouth 2 (two) times daily. Patient not taking: Reported on 11/08/2016 10/05/16   Hedges, Dellis Filbert, PA-C  predniSONE (DELTASONE) 20 MG tablet Take 3 tablets (60 mg total) by mouth daily. Patient not taking: Reported on 11/19/2015 04/22/15   Muthersbaugh, Jarrett Soho, PA-C    Family History Family History  Problem Relation Age of Onset  . Sickle cell anemia Mother   . Cancer Mother   . Depression Mother   . Diabetes Other        grandparents and aunts/uncles  . Stroke Other        grandparent    Social History Social History  Substance Use Topics  . Smoking status: Current Every Day Smoker    Packs/day: 0.25    Types: Cigarettes  . Smokeless tobacco: Never Used  . Alcohol use Yes     Comment: ocassionally     Allergies   Monistat [tioconazole]; Chocolate; and Banana   Review of Systems Review of Systems  Constitutional: Negative for chills, diaphoresis, fatigue and fever.  Eyes: Negative for photophobia, pain and visual disturbance.  Respiratory: Negative for cough, choking,  chest tightness and shortness of breath.   Cardiovascular: Negative for chest pain and palpitations.  Gastrointestinal: Negative for abdominal pain, constipation, diarrhea, nausea and vomiting.  Genitourinary: Positive for vaginal discharge. Negative for decreased urine volume, dysuria, flank pain, frequency, hematuria, vaginal bleeding and vaginal pain.  Musculoskeletal: Negative for arthralgias, back pain, neck pain and neck stiffness.  Skin: Negative for color change, rash and wound.  Neurological: Negative for seizures, syncope,  light-headedness, numbness and headaches.  Psychiatric/Behavioral: Negative for agitation and confusion.  All other systems reviewed and are negative.    Physical Exam Updated Vital Signs BP 129/84 (BP Location: Right Arm)   Pulse (!) 108   Temp 98.2 F (36.8 C) (Oral)   Resp 14   Ht 5\' 3"  (1.6 m)   Wt 92.5 kg (204 lb)   LMP 12/01/2016 (Exact Date)   SpO2 97%   BMI 36.14 kg/m   Physical Exam  Constitutional: She appears well-developed and well-nourished. No distress.  HENT:  Head: Normocephalic and atraumatic.  Mouth/Throat: Oropharynx is clear and moist. No oropharyngeal exudate.  Eyes: Pupils are equal, round, and reactive to light. Conjunctivae and EOM are normal.  Neck: Normal range of motion. Neck supple.  Cardiovascular: Intact distal pulses.  Tachycardia present.   No murmur heard. Pulmonary/Chest: Effort normal and breath sounds normal. No stridor. No respiratory distress. She has no wheezes. She exhibits no tenderness.  Abdominal: Soft. There is no tenderness.  Genitourinary: Uterus is not tender. Cervix exhibits discharge. Cervix exhibits no motion tenderness and no friability. Right adnexum displays no tenderness. Left adnexum displays no tenderness. No erythema, tenderness or bleeding in the vagina. Vaginal discharge found.  Genitourinary Comments: Greenish yellow discharge in vagina.  No cervical motion tenderness or adnexal tenderness.  Musculoskeletal: She exhibits no edema or tenderness.  Neurological: She is alert. No sensory deficit. She exhibits normal muscle tone.  Skin: Skin is warm and dry. Capillary refill takes less than 2 seconds. No rash noted. She is not diaphoretic. No erythema.  Psychiatric: She has a normal mood and affect.  Nursing note and vitals reviewed.    ED Treatments / Results  Labs (all labs ordered are listed, but only abnormal results are displayed) Labs Reviewed  WET PREP, GENITAL - Abnormal; Notable for the following:        Result Value   Trich, Wet Prep PRESENT (*)    WBC, Wet Prep HPF POC MANY (*)    All other components within normal limits  POC URINE PREG, ED  WET PREP  (BD AFFIRM) ([place])  GC/CHLAMYDIA PROBE AMP ([place]) NOT AT [facility]    EKG  EKG Interpretation None       Radiology No results found.  Procedures Procedures (including critical care time)  Medications Ordered in ED Medications  metroNIDAZOLE (FLAGYL) tablet 2,000 mg (not administered)  cefTRIAXone (ROCEPHIN) injection 250 mg (not administered)  azithromycin (ZITHROMAX) tablet 1,000 mg (not administered)     Initial Impression / Assessment and Plan / ED Course  I have reviewed the triage vital signs and the nursing notes.  Pertinent labs & imaging results that were available during my care of the patient were reviewed by me and considered in my medical decision making (see chart for details).     Grace Montgomery is a 26 y.o. female with a past medical history significant for depression, obesity, and prior sexual transmitted infections who presents with a 2-day history of vaginal itching and vaginal discharge.  Patient reports  that her significant other cheated on her 2 weeks ago with another individual and she is concerned she may have "gotten something".  Patient says that this is happened before.  Patient describes vaginal itching and a clear discharge starting yesterday.  Patient wants to be checked out.  Patient denies any abdominal pain or vaginal pain.  She denies any vaginal bleeding.  She says her menstrual cycle was several weeks ago.  Patient denies nausea, vomiting, fevers, or chills.  She denies any abdominal pain.  She reports that she donated plasma earlier today and is feeling slightly dehydrated.  Patient's heart rate is slightly tachycardic on arrival.  On exam, abdomen was nontender.  Lungs are clear.  Chest is nontender.  Patient requested fluids for dehydration.  Oral fluids were given.  Patient  will have a pelvic exam with wet prep and GC chlamydia testing.  Patient was offered HIV testing and she did not want it at this time.  Anticipate reassessment after pelvic exam.      Pelvic exam had yellowish green discharge but no cervical motion tenderness or adnexal tenderness.  Will await results of labs however anticipate antibiotic therapy.  Due to lack of tenderness, do not feel patient needs pelvic ultrasound to look for TOA.  Patient's wet prep revealed trichomoniasis but no BV.  Given the discharge, patient will be treated empirically for GC/chlamydia.  Patient given Rocephin and azithromycin as well as a dose of metronidazole.  Patient instructed to follow-up with PCP for further management.  Do not feel patient has PID based on lack of tenderness or pain.  Patient understood return precautions for any new or worsening symptoms.  Patient discharged in good condition.   Final Clinical Impressions(s) / ED Diagnoses   Final diagnoses:  Cervicitis  Trichomoniasis  Vaginal discharge    New Prescriptions New Prescriptions   No medications on file    Clinical Impression: 1. Cervicitis   2. Trichomoniasis   3. Vaginal discharge     Disposition: Discharge  Condition: Good  I have discussed the results, Dx and Tx plan with the pt(& family if present). He/she/they expressed understanding and agree(s) with the plan. Discharge instructions discussed at great length. Strict return precautions discussed and pt &/or family have verbalized understanding of the instructions. No further questions at time of discharge.    New Prescriptions   No medications on file    Follow Up: [place] [address] [id number] [place] [phone]  If symptoms worsen  [place] [address] [place] [address] [phone] Schedule an appointment as soon as  possible for a visit       Cleatus Gabriel, Gwenyth Allegra, MD 12/25/16 2257

## 2016-12-25 NOTE — Discharge Instructions (Signed)
Your workup today showed evidence of trichomoniasis.  We gave you antibiotics to cover for this as well as for other sexually transmitted infections.  Please follow-up with your primary care physician for further management.  If any symptoms change or worsen, please return to the nearest emergency department.

## 2016-12-25 NOTE — ED Notes (Signed)
Pt states that shes ready to go home. States that last bus leaves at 2130. MD aware.

## 2016-12-25 NOTE — ED Notes (Signed)
Lab called and state that they have samples but no orders for GC/ch and wet prep. Checked the chart and we do have orders for the above tests. They will start the orders.

## 2016-12-27 LAB — GC/CHLAMYDIA PROBE AMP (~~LOC~~) NOT AT ARMC
Chlamydia: NEGATIVE
NEISSERIA GONORRHEA: NEGATIVE

## 2017-02-12 ENCOUNTER — Emergency Department (HOSPITAL_COMMUNITY)
Admission: EM | Admit: 2017-02-12 | Discharge: 2017-02-12 | Disposition: A | Discharge status: Home / Self Care | Payer: Self-pay | Attending: Emergency Medicine | Admitting: Emergency Medicine

## 2017-02-12 ENCOUNTER — Encounter (HOSPITAL_COMMUNITY): Payer: Self-pay | Admitting: Emergency Medicine

## 2017-02-12 DIAGNOSIS — J4530 Mild persistent asthma, uncomplicated: Secondary | ICD-10-CM | POA: Insufficient documentation

## 2017-02-12 DIAGNOSIS — F17210 Nicotine dependence, cigarettes, uncomplicated: Secondary | ICD-10-CM | POA: Insufficient documentation

## 2017-02-12 DIAGNOSIS — R21 Rash and other nonspecific skin eruption: Secondary | ICD-10-CM | POA: Insufficient documentation

## 2017-02-12 MED ORDER — PREDNISONE 50 MG PO TABS: ORAL_TABLET | ORAL | 0 refills | Status: DC

## 2017-02-12 MED ORDER — DEXAMETHASONE SODIUM PHOSPHATE 10 MG/ML IJ SOLN
10.0000 mg | Freq: Once | INTRAMUSCULAR | Status: AC
  Administered 2017-02-12: 10 mg via INTRAMUSCULAR
  Filled 2017-02-12: qty 1

## 2017-02-12 MED ORDER — DIPHENHYDRAMINE HCL 25 MG PO TABS: ORAL_TABLET | ORAL | 0 refills | Status: DC

## 2017-02-12 NOTE — ED Triage Notes (Signed)
Pt states she stayed in hotel weds, noticed a rash started on Thursday. Pt has generalized rash to body. No shortness of breath, no sore throat no swelling to tongue or lips. Pt states she is itchy everywhere.

## 2017-02-12 NOTE — ED Provider Notes (Signed)
[facility] EMERGENCY DEPARTMENT Provider Note   CSN: [id number] Arrival date & time: 02/12/17  1113     History   Chief Complaint Chief Complaint  Patient presents with  . Rash    HPI [facility] is a 26 y.o. female the emergency department complaining of a rash that started approximately 36 hours ago.  States that rash started on her posterior neck and has now spread to her back, abdomen, extremities x4, and face. Describes the rash as pruritic. States that she tried Benadryl and Goodie Powder without relief. Cannot recall any new detergents, moisturizers, products, or foods. Patient states that the night before the rash appeared she stayed at a motel [address] she had not stayed at previously.  Denies difficulty breathing, feeling of throat closing, or swelling to lips or periorbital region.   HPI  Past Medical History:  Diagnosis Date  . Anxiety   . Asthma   . Cannabis dependence   . 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 Contraception management 08/22/2012  . Rash and nonspecific skin eruption 08/22/2012  . Obesity 06/29/2011  . Seasonal allergies 06/29/2011  . Asthma, mild persistent 06/29/2011  . Major depressive disorder 04/26/2011    Class: Acute    History reviewed. No pertinent surgical history.  OB History    Gravida Para Term Preterm AB Living   2 2 0 0 0 2   SAB TAB Ectopic Multiple Live Births   0 0 0 0         Home Medications    Prior to Admission medications   Medication Sig Start Date End Date Taking? Authorizing Provider  albuterol (PROVENTIL HFA;VENTOLIN HFA) 108 (90 Base) MCG/ACT inhaler Inhale 2 puffs into the lungs every 4 (four) hours as needed for wheezing or shortness of breath. 04/22/15   Muthersbaugh, Jarrett Soho, PA-C  benzonatate (TESSALON) 100 MG capsule Take 1 capsule (100 mg total) by mouth every 8 (eight) hours. Patient not taking:  Reported on 11/19/2015 04/22/15   Muthersbaugh, Jarrett Soho, PA-C  HYDROcodone-acetaminophen (NORCO/VICODIN) 5-325 MG tablet Take 1-2 tablets by mouth every 6 (six) hours as needed. Patient not taking: Reported on 11/19/2015 10/19/15   Montine Circle, PA-C  Naproxen Sodium (ALEVE) 220 MG CAPS Take 440 mg by mouth daily as needed (back pain).    [provider]    Family History Family History  Problem Relation Age of Onset  . Sickle cell anemia Mother   . Cancer Mother   . Depression Mother   . Diabetes Other        grandparents and aunts/uncles  . Stroke Other        grandparent    Social History Social History   Tobacco Use  . Smoking status: Current Every Day Smoker    Packs/day: 0.25    Types: Cigarettes  . Smokeless tobacco: Never Used  Substance Use Topics  . Alcohol use: Yes    Comment: ocassionally  . Drug use: No    Comment: History of Marijuana     Allergies   Monistat [tioconazole]; Chocolate; and Banana   Review of Systems Review of Systems  Constitutional: Negative for chills and fever.  HENT: Negative for trouble swallowing.   Respiratory: Negative for cough, choking, shortness of breath and wheezing.   Gastrointestinal: Negative for abdominal pain.  Skin: Positive for rash.     Physical Exam Updated Vital Signs  BP 106/66 (BP Location: Right Arm)   Pulse 100   Temp 98.3 F (36.8 C) (Oral)   Resp 16   Ht 5\' 3"  (1.6 m)   Wt 93 kg (205 lb)   LMP 01/25/2017   SpO2 98%   BMI 36.31 kg/m   Physical Exam  Constitutional: She appears well-developed and well-nourished. No distress.  HENT:  Head: Normocephalic and atraumatic.  Mouth/Throat: Uvula is midline and oropharynx is clear and moist. No trismus in the jaw. No uvula swelling. No posterior oropharyngeal edema.  No lip swelling.   Eyes: Conjunctivae are normal.  No periorbital swelling bilaterally.   Neck: Normal range of motion. Neck supple.  Pulmonary/Chest: Effort normal and breath  sounds normal. No stridor. No respiratory distress.  Neurological: She is alert.  Clear speech.   Skin: Rash noted. Rash is papular (erythematous- diffuse to face, back, abdomen, and extremities X 4- spares palms and soles). Rash is not pustular and not urticarial.  LUE: excoriations present- patient reports from scratching  Psychiatric: She has a normal mood and affect. Her behavior is normal. Thought content normal.  Nursing note and vitals reviewed.    ED Treatments / Results  Labs (all labs ordered are listed, but only abnormal results are displayed) Labs Reviewed - No data to display  EKG  EKG Interpretation None      Radiology No results found.  Procedures Procedures (including critical care time)  Medications Ordered in ED Medications  dexamethasone (DECADRON) injection 10 mg (not administered)     Initial Impression / Assessment and Plan / ED Course  I have reviewed the triage vital signs and the nursing notes.  Pertinent labs & imaging results that were available during my care of the patient were reviewed by me and considered in my medical decision making (see chart for details).     Patient resents with complaint of pruritic rash.  She is nontoxic-appearing, in no apparent distress, with stable vital signs.  She is without difficulty breathing or swallowing. Patient has a patent airway without stridor and is handling secretions without difficulty; no angioedema. No blisters, no pustules, no warmth, no draining sinus tracts, no superficial abscesses, no bullous impetigo, no vesicles, no desquamation, no target lesions with dusky purpura or a central bulla. Not tender to touch. No concern for superimposed infection. No concern for SJS, TEN, TSS, tick borne illness, syphilis or other life-threatening condition.  Rash is diffuse, erythematous, and papular. Suspect contact irritant dermatitis. Will treat with a short course of steroids and Benadryl. I discussed treatment  plan, need for PCP follow-up, and return precautions with the patient. Provided opportunity for questions, patient confirmed understanding and is in agreement with plan.   Final Clinical Impressions(s) / ED Diagnoses   Final diagnoses:  Rash    ED Discharge Orders        Ordered    predniSONE (DELTASONE) 50 MG tablet     02/12/17 1203    diphenhydrAMINE (BENADRYL) 25 MG tablet     02/12/17 [address] Ozone R, PA-C 02/12/17 1215    Drenda Freeze, MD 02/13/17 [phone]

## 2017-02-12 NOTE — Discharge Instructions (Signed)
You were seen in the emergency department today for a rash.  I suspect that the rash is related to something that irritated to her skin.  We are not sure what exactly caused this is at this point.  You received an injection of a steroid today as well as a prescription for a 5-day course of steroids to treat the rash.  To need to take Benadryl for itching- be aware that this medication may make you drowsy, do not drive or operate heavy machinery when taking this.  I have prescribed a new medication for you today. It is important that when you pick the prescription up you discuss the potential interactions of this medication with other medications you are taking, including over the counter medications, with the pharmacists.   This new medication has potential side effects. Be sure to contact your primary care provider or return to the emergency department if you are experiencing new symptoms that you are unable to tolerate after starting the medication. You need to receive medical evaluation immediately if you start to experience blistering of the skin, rash, swelling, or difficulty breathing as these signs could indicate a more serious medication side effect.    Follow up with your primary care provider in the next 1 week.  Return to the emergency department for any new or worsening symptoms including but not limited to difficulty breathing, feeling of throat closing, or facial swelling.

## 2017-02-12 NOTE — ED Notes (Signed)
Declined W/C at D/C and was escorted to lobby by RN. 

## 2017-02-15 ENCOUNTER — Emergency Department (HOSPITAL_COMMUNITY)
Admission: EM | Admit: 2017-02-15 | Discharge: 2017-02-16 | Disposition: A | Discharge status: Other Acute Inpatient Hospital | Payer: Self-pay | Attending: Emergency Medicine | Admitting: Emergency Medicine

## 2017-02-15 ENCOUNTER — Other Ambulatory Visit: Payer: Self-pay

## 2017-02-15 ENCOUNTER — Encounter (HOSPITAL_COMMUNITY): Payer: Self-pay

## 2017-02-15 DIAGNOSIS — J45909 Unspecified asthma, uncomplicated: Secondary | ICD-10-CM | POA: Insufficient documentation

## 2017-02-15 DIAGNOSIS — F332 Major depressive disorder, recurrent severe without psychotic features: Secondary | ICD-10-CM

## 2017-02-15 DIAGNOSIS — F17210 Nicotine dependence, cigarettes, uncomplicated: Secondary | ICD-10-CM | POA: Insufficient documentation

## 2017-02-15 DIAGNOSIS — Z046 Encounter for general psychiatric examination, requested by authority: Secondary | ICD-10-CM | POA: Insufficient documentation

## 2017-02-15 DIAGNOSIS — R21 Rash and other nonspecific skin eruption: Secondary | ICD-10-CM | POA: Insufficient documentation

## 2017-02-15 DIAGNOSIS — R45851 Suicidal ideations: Secondary | ICD-10-CM | POA: Insufficient documentation

## 2017-02-15 DIAGNOSIS — F430 Acute stress reaction: Secondary | ICD-10-CM | POA: Insufficient documentation

## 2017-02-15 LAB — CBC
HEMATOCRIT: 33 % — AB (ref 36.0–46.0)
HEMOGLOBIN: 10.6 g/dL — AB (ref 12.0–15.0)
MCH: 27.5 pg (ref 26.0–34.0)
MCHC: 32.1 g/dL (ref 30.0–36.0)
MCV: 85.5 fL (ref 78.0–100.0)
Platelets: 360 10*3/uL (ref 150–400)
RBC: 3.86 MIL/uL — ABNORMAL LOW (ref 3.87–5.11)
RDW: 15.4 % (ref 11.5–15.5)
WBC: 10.7 10*3/uL — AB (ref 4.0–10.5)

## 2017-02-15 LAB — COMPREHENSIVE METABOLIC PANEL
ALT: 15 U/L (ref 14–54)
ANION GAP: 10 (ref 5–15)
AST: 22 U/L (ref 15–41)
Albumin: 3.5 g/dL (ref 3.5–5.0)
Alkaline Phosphatase: 59 U/L (ref 38–126)
BILIRUBIN TOTAL: 0.4 mg/dL (ref 0.3–1.2)
BUN: 7 mg/dL (ref 6–20)
CALCIUM: 8.7 mg/dL — AB (ref 8.9–10.3)
CO2: 22 mmol/L (ref 22–32)
Chloride: 104 mmol/L (ref 101–111)
Creatinine, Ser: 0.99 mg/dL (ref 0.44–1.00)
GFR calc Af Amer: >60 mL/min (ref >60)
GLUCOSE: 109 mg/dL — AB (ref 65–99)
POTASSIUM: 3.5 mmol/L (ref 3.5–5.1)
Sodium: 136 mmol/L (ref 135–145)
TOTAL PROTEIN: 6.9 g/dL (ref 6.5–8.1)

## 2017-02-15 LAB — I-STAT BETA HCG BLOOD, ED (MC, WL, AP ONLY): I-stat hCG, quantitative: <5 m[IU]/mL (ref <5)

## 2017-02-15 LAB — ACETAMINOPHEN LEVEL: Acetaminophen (Tylenol), Serum: <10 ug/mL — ABNORMAL LOW (ref 10–30)

## 2017-02-15 LAB — ETHANOL: Alcohol, Ethyl (B): 17 mg/dL — ABNORMAL HIGH (ref <10)

## 2017-02-15 LAB — SALICYLATE LEVEL: Salicylate Lvl: <7 mg/dL (ref 2.8–30.0)

## 2017-02-15 MED ORDER — DIPHENHYDRAMINE HCL 25 MG PO CAPS
25.0000 mg | ORAL_CAPSULE | Freq: Once | ORAL | Status: AC
  Administered 2017-02-15: 25 mg via ORAL
  Filled 2017-02-15: qty 1

## 2017-02-15 NOTE — ED Triage Notes (Signed)
Pt states she has been sitting in front of the hospital since 0900. She states she has had suicidal ideation X2 days. She states "nothing has been going right." Pt denies any drug or alcohol use. No suicide plan.

## 2017-02-15 NOTE — ED Notes (Signed)
Dinner tray ordered.
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
## 2017-02-15 NOTE — ED Notes (Signed)
Pt changed into burgundy scrubs; pt's belongings placed in locker #1 in [place]

## 2017-02-15 NOTE — ED Provider Notes (Signed)
[facility] Provider Note   CSN: [id number] Arrival date & time: 02/15/17  1228     History   Chief Complaint Chief Complaint  Patient presents with  . Suicidal    HPI Grace Montgomery is a 26 y.o. female.  HPI   26 year old female presents today with suicidal ideations.  Patient reports that she would like to hurt her self, reports that she feels overwhelmed.  She denies any plan.  She denies taking any medications,, denies any drug or alcohol use.  Patient also reports a rash to her left arm that she was seen in the emergency room for.  She was prescribed Benadryl and prednisone, but did not have money to get the medications.  Patient denies any other physical complaints today.      Past Medical History:  Diagnosis Date  . Anxiety   . Asthma   . Cannabis dependence   . 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 Contraception management 08/22/2012  . Rash and nonspecific skin eruption 08/22/2012  . Obesity 06/29/2011  . Seasonal allergies 06/29/2011  . Asthma, mild persistent 06/29/2011  . Major depressive disorder 04/26/2011    Class: Acute    History reviewed. No pertinent surgical history.  OB History    Gravida Para Term Preterm AB Living   2 2 0 0 0 2   SAB TAB Ectopic Multiple Live Births   0 0 0 0         Home Medications    Prior to Admission medications   Not on File    Family History Family History  Problem Relation Age of Onset  . Sickle cell anemia Mother   . Cancer Mother   . Depression Mother   . Diabetes Other        grandparents and aunts/uncles  . Stroke Other        grandparent    Social History Social History   Tobacco Use  . Smoking status: Current Every Day Smoker    Packs/day: 0.25    Types: Cigarettes  . Smokeless tobacco: Never Used  Substance Use Topics  . Alcohol use: Yes    Comment:  ocassionally  . Drug use: No    Comment: History of Marijuana     Allergies   Monistat [tioconazole]; Chocolate; and Banana   Review of Systems Review of Systems  All other systems reviewed and are negative.    Physical Exam Updated Vital Signs BP 111/79 (BP Location: Right Arm)   Pulse 86   Temp 99 F (37.2 C) (Oral)   Resp 16   Ht 5\' 3"  (1.6 m)   Wt 93 kg (205 lb)   LMP 01/25/2017   SpO2 98%   BMI 36.31 kg/m   Physical Exam  Constitutional: She is oriented to person, place, and time. She appears well-developed and well-nourished.  HENT:  Head: Normocephalic and atraumatic.  Eyes: Conjunctivae are normal. Pupils are equal, round, and reactive to light. Right eye exhibits no discharge. Left eye exhibits no discharge. No scleral icterus.  Neck: Normal range of motion. No JVD present. No tracheal deviation present.  Pulmonary/Chest: Effort normal. No stridor.  Neurological: She is alert and oriented to person, place, and time. Coordination normal.  Skin:  Erythematous rash to the right upper extremity  Psychiatric: She has a normal mood and affect. Her behavior is normal. Judgment  and thought content normal.  Nursing note and vitals reviewed.    ED Treatments / Results  Labs (all labs ordered are listed, but only abnormal results are displayed) Labs Reviewed  COMPREHENSIVE METABOLIC PANEL - Abnormal; Notable for the following components:      Result Value   Glucose, Bld 109 (*)    Calcium 8.7 (*)    All other components within normal limits  ETHANOL - Abnormal; Notable for the following components:   Alcohol, Ethyl (B) 17 (*)    All other components within normal limits  ACETAMINOPHEN LEVEL - Abnormal; Notable for the following components:   Acetaminophen (Tylenol), Serum <10 (*)    All other components within normal limits  CBC - Abnormal; Notable for the following components:   WBC 10.7 (*)    RBC 3.86 (*)    Hemoglobin 10.6 (*)    HCT 33.0 (*)    All  other components within normal limits  SALICYLATE LEVEL  RAPID URINE DRUG SCREEN, HOSP PERFORMED  I-STAT BETA HCG BLOOD, ED ([facility], [facility], [facility] ONLY)    EKG  EKG Interpretation None       Radiology No results found.  Procedures Procedures (including critical care time)  Medications Ordered in ED Medications  diphenhydrAMINE (BENADRYL) capsule 25 mg (25 mg Oral Given 02/15/17 1654)     Initial Impression / Assessment and Plan / ED Course  I have reviewed the triage vital signs and the nursing notes.  Pertinent labs & imaging results that were available during my care of the patient were reviewed by me and considered in my medical decision making (see chart for details).  Clinical Course as of Feb 15 1753  Tue Feb 15, 2017  1512 Alcohol, Ethyl (B): (!) 17 [EC]    Clinical Course User Index [EC] Cabel, Fabio Asa, [place]     Final Clinical Impressions(s) / ED Diagnoses   Final diagnoses:  Suicidal ideation    Assessment/Plan: 26 year old female presents today with suicidal ideations.  Patient medically cleared awaiting TTS evaluation.    ED Discharge Orders    None       Francee Gentile 02/15/17 1709    Okey Regal, PA-C 02/15/17 1754    Davonna Belling, MD 02/16/17 [phone]

## 2017-02-16 ENCOUNTER — Encounter (HOSPITAL_COMMUNITY): Payer: Self-pay | Admitting: Registered Nurse

## 2017-02-16 DIAGNOSIS — F332 Major depressive disorder, recurrent severe without psychotic features: Secondary | ICD-10-CM

## 2017-02-16 HISTORY — DX: Major depressive disorder, recurrent severe without psychotic features: F33.2

## 2017-02-16 LAB — RAPID URINE DRUG SCREEN, HOSP PERFORMED
AMPHETAMINES: NOT DETECTED
BARBITURATES: NOT DETECTED
Benzodiazepines: NOT DETECTED
Cocaine: NOT DETECTED
Opiates: NOT DETECTED
TETRAHYDROCANNABINOL: POSITIVE — AB

## 2017-02-16 NOTE — ED Notes (Signed)
[facility] CALLED

## 2017-02-16 NOTE — ED Notes (Signed)
Pt valuables returned from security. Valuables and belongings given to [facility] transport.

## 2017-02-16 NOTE — Discharge Planning (Addendum)
26 year old female presents today with suicidal ideations.  Patient medically cleared ([place], PA 12/11) awaiting TTS placement.
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
## 2017-02-16 NOTE — Progress Notes (Signed)
Accepted by [facility] in [place], [place].   Accepting/attending physician is Dr. Deri Fuelling Patient is going to room 244-2 Nurse to Nurse report number is [phone] The bed is ready, patient can transport at anytime.   If the patient does not arrive by 11:00pm tonight, please transport after 6:00am tomorrow.     Earleen Newport, RN notified.    Radonna Ricker MSW, [place] Disposition [phone]

## 2017-02-16 NOTE — Progress Notes (Signed)
Patient meets criteria for inpatient treatment. CSW faxed referrals to the following inpatient facilities for review:  [facility], [facility], [facility], [facility], [facility], [facility], [facility], [facility], [facility]    TTS will continue to seek bed placement.   Radonna Ricker MSW, [place] Disposition [phone]

## 2017-02-17 DIAGNOSIS — F332 Major depressive disorder, recurrent severe without psychotic features: Secondary | ICD-10-CM

## 2017-02-17 HISTORY — DX: Major depressive disorder, recurrent severe without psychotic features: F33.2

## 2017-02-26 ENCOUNTER — Emergency Department (HOSPITAL_COMMUNITY)
Admission: EM | Admit: 2017-02-26 | Discharge: 2017-02-26 | Disposition: A | Discharge status: Home / Self Care | Payer: Self-pay | Attending: Emergency Medicine | Admitting: Emergency Medicine

## 2017-02-26 ENCOUNTER — Encounter (HOSPITAL_COMMUNITY): Payer: Self-pay | Admitting: Emergency Medicine

## 2017-02-26 ENCOUNTER — Other Ambulatory Visit: Payer: Self-pay

## 2017-02-26 DIAGNOSIS — J45909 Unspecified asthma, uncomplicated: Secondary | ICD-10-CM | POA: Insufficient documentation

## 2017-02-26 DIAGNOSIS — R519 Headache, unspecified: Secondary | ICD-10-CM

## 2017-02-26 DIAGNOSIS — F17210 Nicotine dependence, cigarettes, uncomplicated: Secondary | ICD-10-CM | POA: Insufficient documentation

## 2017-02-26 DIAGNOSIS — R51 Headache: Secondary | ICD-10-CM | POA: Insufficient documentation

## 2017-02-26 MED ORDER — METOCLOPRAMIDE HCL 5 MG/ML IJ SOLN
10.0000 mg | Freq: Once | INTRAMUSCULAR | Status: DC
  Filled 2017-02-26: qty 2

## 2017-02-26 MED ORDER — KETOROLAC TROMETHAMINE 30 MG/ML IJ SOLN
30.0000 mg | Freq: Once | INTRAMUSCULAR | Status: DC
  Filled 2017-02-26: qty 1

## 2017-02-26 MED ORDER — DIPHENHYDRAMINE HCL 50 MG/ML IJ SOLN
25.0000 mg | Freq: Once | INTRAMUSCULAR | Status: DC
  Filled 2017-02-26: qty 1

## 2017-02-26 MED ORDER — ACETAMINOPHEN 500 MG PO TABS
1000.0000 mg | ORAL_TABLET | Freq: Once | ORAL | Status: AC
  Administered 2017-02-26: 1000 mg via ORAL
  Filled 2017-02-26: qty 2

## 2017-02-26 MED ORDER — SODIUM CHLORIDE 0.9 % IV BOLUS (SEPSIS): 1000.0000 mL | Freq: Once | INTRAVENOUS | Status: DC

## 2017-02-26 NOTE — ED Triage Notes (Addendum)
Patient just got out Behavior health a week ago. Patient is having a headache after taking a new psych medication.

## 2017-02-26 NOTE — ED Provider Notes (Signed)
[facility] DEPT Provider Note   CSN: [id number] Arrival date & time: 02/26/17  0340     History   Chief Complaint Chief Complaint  Patient presents with  . Headache    HPI Grace Montgomery is a 26 y.o. female.  Patient presents to the ED with a chief complaint of headache.  She reports having had intermittent headaches for months.  She states that the symptoms come and go. She states that she thinks the headache worsened tonight because of drinking alcohol.  She states that she sometimes has tingling and vision changes, but associates this with when her blood sugar is running high. She denies these symptoms now.  She denies any trauma.  She denies any other associated symptoms.   The history is provided by the patient. No language interpreter was used.    Past Medical History:  Diagnosis Date  . Anxiety   . Asthma   . Cannabis dependence   . 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 MDD (major depressive disorder), recurrent severe, without psychosis ([place]) 02/16/2017  . Contraception management 08/22/2012  . Rash and nonspecific skin eruption 08/22/2012  . Obesity 06/29/2011  . Seasonal allergies 06/29/2011  . Asthma, mild persistent 06/29/2011  . Major depressive disorder 04/26/2011    Class: Acute    History reviewed. No pertinent surgical history.  OB History    Gravida Para Term Preterm AB Living   2 2 0 0 0 2   SAB TAB Ectopic Multiple Live Births   0 0 0 0         Home Medications    Prior to Admission medications   Not on File    Family History Family History  Problem Relation Age of Onset  . Sickle cell anemia Mother   . Cancer Mother   . Depression Mother   . Diabetes Other        grandparents and aunts/uncles  . Stroke Other        grandparent    Social History Social History   Tobacco Use  . Smoking status: Current Every  Day Smoker    Packs/day: 0.25    Types: Cigarettes  . Smokeless tobacco: Never Used  Substance Use Topics  . Alcohol use: Yes    Comment: ocassionally  . Drug use: No    Comment: History of Marijuana     Allergies   Monistat [tioconazole]; Chocolate; and Banana   Review of Systems Review of Systems  All other systems reviewed and are negative.    Physical Exam Updated Vital Signs BP (!) 143/81 (BP Location: Left Arm)   Pulse 64   Temp 98.1 F (36.7 C)   Resp 18   Ht 5\' 3"  (1.6 m)   Wt 89.4 kg (197 lb)   SpO2 100%   BMI 34.90 kg/m   Physical Exam  Constitutional: She is oriented to person, place, and time. She appears well-developed and well-nourished.  HENT:  Head: Normocephalic and atraumatic.  Right Ear: External ear normal.  Left Ear: External ear normal.  Eyes: Conjunctivae and EOM are normal. Pupils are equal, round, and reactive to light.  Neck: Normal range of motion. Neck supple.  No pain with neck flexion, no meningismus  Cardiovascular: Normal rate, regular rhythm and normal heart sounds. Exam reveals no gallop and no friction rub.  No murmur heard. Pulmonary/Chest: Effort normal and  breath sounds normal. No respiratory distress. She has no wheezes. She has no rales. She exhibits no tenderness.  Abdominal: Soft. Bowel sounds are normal. She exhibits no distension and no mass. There is no tenderness. There is no rebound and no guarding.  Musculoskeletal: Normal range of motion. She exhibits no edema or tenderness.  Normal gait.  Neurological: She is alert and oriented to person, place, and time. She has normal reflexes.  CN 3-12 intact, normal finger to nose, no pronator drift, sensation and strength intact bilaterally.  Skin: Skin is warm and dry.  Psychiatric: She has a normal mood and affect. Her behavior is normal. Judgment and thought content normal.  Nursing note and vitals reviewed.    ED Treatments / Results  Labs (all labs ordered are  listed, but only abnormal results are displayed) Labs Reviewed - No data to display  EKG  EKG Interpretation None       Radiology No results found.  Procedures Procedures (including critical care time)  Medications Ordered in ED Medications  ketorolac (TORADOL) 30 MG/ML injection 30 mg (not administered)  metoCLOPramide (REGLAN) injection 10 mg (not administered)  diphenhydrAMINE (BENADRYL) injection 25 mg (not administered)  sodium chloride 0.9 % bolus 1,000 mL (not administered)     Initial Impression / Assessment and Plan / ED Course  I have reviewed the triage vital signs and the nursing notes.  Pertinent labs & imaging results that were available during my care of the patient were reviewed by me and considered in my medical decision making (see chart for details).    Presentation is like pts typical HA and non concerning for [facility], ICH, Meningitis, or temporal arteritis. Pt is afebrile with no focal neuro deficits, nuchal rigidity, or change in vision. Pt is to follow up with PCP to discuss prophylactic medication. She refuses IV meds for her headache.  Will give Tylenol. Pt verbalizes understanding and is agreeable with plan to dc.     Final Clinical Impressions(s) / ED Diagnoses   Final diagnoses:  Nonintractable headache, unspecified chronicity pattern, unspecified headache type    ED Discharge Orders    None       Montine Circle, PA-C 02/26/17 [place], Delice Bison, DO 02/26/17 [phone]

## 2017-03-17 ENCOUNTER — Emergency Department (HOSPITAL_COMMUNITY): Admission: EM | Admit: 2017-03-17 | Discharge: 2017-03-17 | Discharge status: Against Medical Advice | Payer: Self-pay

## 2017-03-17 ENCOUNTER — Other Ambulatory Visit: Payer: Self-pay

## 2017-03-17 NOTE — ED Notes (Signed)
Pt name called for triage, no answer

## 2017-03-19 ENCOUNTER — Encounter (HOSPITAL_COMMUNITY): Payer: Self-pay | Admitting: Emergency Medicine

## 2017-03-19 ENCOUNTER — Emergency Department (HOSPITAL_COMMUNITY)
Admission: EM | Admit: 2017-03-19 | Discharge: 2017-03-19 | Disposition: A | Discharge status: Home / Self Care | Payer: Self-pay | Attending: Emergency Medicine | Admitting: Emergency Medicine

## 2017-03-19 DIAGNOSIS — N898 Other specified noninflammatory disorders of vagina: Secondary | ICD-10-CM

## 2017-03-19 DIAGNOSIS — Z7984 Long term (current) use of oral hypoglycemic drugs: Secondary | ICD-10-CM | POA: Insufficient documentation

## 2017-03-19 DIAGNOSIS — B9689 Other specified bacterial agents as the cause of diseases classified elsewhere: Secondary | ICD-10-CM | POA: Insufficient documentation

## 2017-03-19 DIAGNOSIS — F17210 Nicotine dependence, cigarettes, uncomplicated: Secondary | ICD-10-CM | POA: Insufficient documentation

## 2017-03-19 DIAGNOSIS — N760 Acute vaginitis: Secondary | ICD-10-CM | POA: Insufficient documentation

## 2017-03-19 DIAGNOSIS — R102 Pelvic and perineal pain: Secondary | ICD-10-CM

## 2017-03-19 DIAGNOSIS — Z202 Contact with and (suspected) exposure to infections with a predominantly sexual mode of transmission: Secondary | ICD-10-CM | POA: Insufficient documentation

## 2017-03-19 DIAGNOSIS — J45909 Unspecified asthma, uncomplicated: Secondary | ICD-10-CM | POA: Insufficient documentation

## 2017-03-19 LAB — COMPREHENSIVE METABOLIC PANEL
ALBUMIN: 3.6 g/dL (ref 3.5–5.0)
ALT: 14 U/L (ref 14–54)
AST: 17 U/L (ref 15–41)
Alkaline Phosphatase: 60 U/L (ref 38–126)
Anion gap: 7 (ref 5–15)
BUN: 11 mg/dL (ref 6–20)
CHLORIDE: 106 mmol/L (ref 101–111)
CO2: 23 mmol/L (ref 22–32)
CREATININE: 0.79 mg/dL (ref 0.44–1.00)
Calcium: 9.4 mg/dL (ref 8.9–10.3)
GFR calc Af Amer: >60 mL/min (ref >60)
GLUCOSE: 84 mg/dL (ref 65–99)
Potassium: 3.8 mmol/L (ref 3.5–5.1)
Sodium: 136 mmol/L (ref 135–145)
Total Bilirubin: 0.7 mg/dL (ref 0.3–1.2)
Total Protein: 7.2 g/dL (ref 6.5–8.1)

## 2017-03-19 LAB — LIPASE, BLOOD: LIPASE: 26 U/L (ref 11–51)

## 2017-03-19 LAB — URINALYSIS, ROUTINE W REFLEX MICROSCOPIC
BILIRUBIN URINE: NEGATIVE
Glucose, UA: 50 mg/dL — AB
Hgb urine dipstick: NEGATIVE
KETONES UR: NEGATIVE mg/dL
Nitrite: NEGATIVE
PROTEIN: NEGATIVE mg/dL
Specific Gravity, Urine: 1.013 (ref 1.005–1.030)
pH: 5 (ref 5.0–8.0)

## 2017-03-19 LAB — WET PREP, GENITAL
Sperm: NONE SEEN
Trich, Wet Prep: NONE SEEN
Yeast Wet Prep HPF POC: NONE SEEN

## 2017-03-19 LAB — PREGNANCY, URINE: Preg Test, Ur: NEGATIVE

## 2017-03-19 LAB — CBC
HCT: 32.7 % — ABNORMAL LOW (ref 36.0–46.0)
Hemoglobin: 10.4 g/dL — ABNORMAL LOW (ref 12.0–15.0)
MCH: 26.6 pg (ref 26.0–34.0)
MCHC: 31.8 g/dL (ref 30.0–36.0)
MCV: 83.6 fL (ref 78.0–100.0)
PLATELETS: 401 10*3/uL — AB (ref 150–400)
RBC: 3.91 MIL/uL (ref 3.87–5.11)
RDW: 17.1 % — ABNORMAL HIGH (ref 11.5–15.5)
WBC: 14.3 10*3/uL — AB (ref 4.0–10.5)

## 2017-03-19 MED ORDER — AZITHROMYCIN 250 MG PO TABS
1000.0000 mg | ORAL_TABLET | Freq: Once | ORAL | Status: AC
  Administered 2017-03-19: 1000 mg via ORAL
  Filled 2017-03-19: qty 4

## 2017-03-19 MED ORDER — CEFTRIAXONE SODIUM 250 MG IJ SOLR
250.0000 mg | Freq: Once | INTRAMUSCULAR | Status: AC
  Administered 2017-03-19: 250 mg via INTRAMUSCULAR
  Filled 2017-03-19: qty 250

## 2017-03-19 MED ORDER — KETOROLAC TROMETHAMINE 30 MG/ML IJ SOLN
30.0000 mg | Freq: Once | INTRAMUSCULAR | Status: AC
  Administered 2017-03-19: 30 mg via INTRAMUSCULAR
  Filled 2017-03-19: qty 1

## 2017-03-19 MED ORDER — METRONIDAZOLE 500 MG PO TABS: 500.0000 mg | ORAL_TABLET | Freq: Two times a day (BID) | ORAL | 0 refills | Status: DC

## 2017-03-19 MED ORDER — LIDOCAINE HCL (PF) 1 % IJ SOLN
INTRAMUSCULAR | Status: AC
  Administered 2017-03-19: 5 mL
  Filled 2017-03-19: qty 5

## 2017-03-19 NOTE — Discharge Instructions (Signed)
You have been treated prophylactically today for gonorrhea and chlamydia you have STD testing pending and will be called in 2-3 days if any of these results are positive, lab results will also be available on my chart.  Please use protection until you get all of your test results back.  In the future you can follow-up with the health department for STD exposures.  You have worsening pelvic pain, fevers, nausea vomiting or discharge does not improve please follow-up with your OB/GYN or return to the ED.
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
## 2017-03-19 NOTE — ED Notes (Signed)
ED PA in room talking with pt.

## 2017-03-19 NOTE — ED Triage Notes (Signed)
Patient here from home with complaints of STD exposure. Patient reports that her boyfriend tested positive for gonorrhea and would like to get tested. Reports lower pelvic pain.

## 2017-03-20 LAB — RPR: RPR Ser Ql: NONREACTIVE

## 2017-03-20 LAB — HIV ANTIBODY (ROUTINE TESTING W REFLEX): HIV SCREEN 4TH GENERATION: NONREACTIVE

## 2017-03-21 LAB — GC/CHLAMYDIA PROBE AMP (~~LOC~~) NOT AT ARMC
Chlamydia: NEGATIVE
Neisseria Gonorrhea: POSITIVE — AB

## 2017-09-22 ENCOUNTER — Inpatient Hospital Stay (HOSPITAL_COMMUNITY)
Admission: AD | Admit: 2017-09-22 | Discharge: 2017-09-22 | Disposition: A | Discharge status: Home / Self Care | Payer: Self-pay | Source: Ambulatory Visit | Attending: Obstetrics & Gynecology | Admitting: Obstetrics & Gynecology

## 2017-09-22 ENCOUNTER — Encounter (HOSPITAL_COMMUNITY): Payer: Self-pay | Admitting: *Deleted

## 2017-09-22 DIAGNOSIS — S3991XA Unspecified injury of abdomen, initial encounter: Secondary | ICD-10-CM | POA: Insufficient documentation

## 2017-09-22 DIAGNOSIS — F17210 Nicotine dependence, cigarettes, uncomplicated: Secondary | ICD-10-CM | POA: Insufficient documentation

## 2017-09-22 DIAGNOSIS — Z7984 Long term (current) use of oral hypoglycemic drugs: Secondary | ICD-10-CM | POA: Insufficient documentation

## 2017-09-22 DIAGNOSIS — Z888 Allergy status to other drugs, medicaments and biological substances status: Secondary | ICD-10-CM | POA: Insufficient documentation

## 2017-09-22 DIAGNOSIS — Z792 Long term (current) use of antibiotics: Secondary | ICD-10-CM | POA: Insufficient documentation

## 2017-09-22 DIAGNOSIS — F329 Major depressive disorder, single episode, unspecified: Secondary | ICD-10-CM | POA: Insufficient documentation

## 2017-09-22 DIAGNOSIS — Z832 Family history of diseases of the blood and blood-forming organs and certain disorders involving the immune mechanism: Secondary | ICD-10-CM | POA: Insufficient documentation

## 2017-09-22 DIAGNOSIS — J45909 Unspecified asthma, uncomplicated: Secondary | ICD-10-CM | POA: Insufficient documentation

## 2017-09-22 DIAGNOSIS — Z809 Family history of malignant neoplasm, unspecified: Secondary | ICD-10-CM | POA: Insufficient documentation

## 2017-09-22 DIAGNOSIS — Z823 Family history of stroke: Secondary | ICD-10-CM | POA: Insufficient documentation

## 2017-09-22 DIAGNOSIS — N912 Amenorrhea, unspecified: Secondary | ICD-10-CM | POA: Insufficient documentation

## 2017-09-22 DIAGNOSIS — Z818 Family history of other mental and behavioral disorders: Secondary | ICD-10-CM | POA: Insufficient documentation

## 2017-09-22 DIAGNOSIS — Z833 Family history of diabetes mellitus: Secondary | ICD-10-CM | POA: Insufficient documentation

## 2017-09-22 DIAGNOSIS — Z79899 Other long term (current) drug therapy: Secondary | ICD-10-CM | POA: Insufficient documentation

## 2017-09-22 DIAGNOSIS — F419 Anxiety disorder, unspecified: Secondary | ICD-10-CM | POA: Insufficient documentation

## 2017-09-22 DIAGNOSIS — T7491XA Unspecified adult maltreatment, confirmed, initial encounter: Secondary | ICD-10-CM | POA: Insufficient documentation

## 2017-09-22 DIAGNOSIS — Z3202 Encounter for pregnancy test, result negative: Secondary | ICD-10-CM | POA: Insufficient documentation

## 2017-09-22 DIAGNOSIS — Z91018 Allergy to other foods: Secondary | ICD-10-CM | POA: Insufficient documentation

## 2017-09-22 LAB — URINALYSIS, ROUTINE W REFLEX MICROSCOPIC
Bilirubin Urine: NEGATIVE
Glucose, UA: NEGATIVE mg/dL
Ketones, ur: 80 mg/dL — AB
LEUKOCYTES UA: NEGATIVE
NITRITE: NEGATIVE
PH: 5 (ref 5.0–8.0)
Protein, ur: 30 mg/dL — AB
SPECIFIC GRAVITY, URINE: 1.03 (ref 1.005–1.030)

## 2017-09-22 LAB — CBC
HCT: 35.9 % — ABNORMAL LOW (ref 36.0–46.0)
Hemoglobin: 12 g/dL (ref 12.0–15.0)
MCH: 29.5 pg (ref 26.0–34.0)
MCHC: 33.4 g/dL (ref 30.0–36.0)
MCV: 88.2 fL (ref 78.0–100.0)
PLATELETS: 323 10*3/uL (ref 150–400)
RBC: 4.07 MIL/uL (ref 3.87–5.11)
RDW: 16.4 % — AB (ref 11.5–15.5)
WBC: 8.8 10*3/uL (ref 4.0–10.5)

## 2017-09-22 LAB — WET PREP, GENITAL
Sperm: NONE SEEN
TRICH WET PREP: NONE SEEN
Yeast Wet Prep HPF POC: NONE SEEN

## 2017-09-22 LAB — HCG, QUANTITATIVE, PREGNANCY

## 2017-09-22 LAB — POCT PREGNANCY, URINE: Preg Test, Ur: NEGATIVE

## 2017-09-22 MED ORDER — ACETAMINOPHEN 500 MG PO TABS
1000.0000 mg | ORAL_TABLET | Freq: Four times a day (QID) | ORAL | Status: DC | PRN
  Administered 2017-09-22: 1000 mg via ORAL
  Filled 2017-09-22: qty 2

## 2017-09-22 NOTE — Discharge Instructions (Signed)
Secondary Amenorrhea Secondary amenorrhea is the stopping of menstrual flow for 3-6 months in a female who has previously had periods. There are many possible causes. Most of these causes are not serious. Usually, treating the underlying problem causing the loss of menses will return your periods to normal. What are the causes? Some common and uncommon causes of not menstruating include:  Malnutrition.  Low blood sugar (hypoglycemia).  Polycystic ovary disease.  Stress or fear.  Breastfeeding.  Hormone imbalance.  Ovarian failure.  Medicines.  Extreme obesity.  Cystic fibrosis.  Low body weight or drastic weight reduction from any cause.  Early menopause.  Removal of ovaries or uterus.  Contraceptives.  Illness.  Long-term (chronic) illnesses.  Cushing syndrome.  Thyroid problems.  Birth control pills, patches, or vaginal rings for birth control.  What increases the risk? You may be at greater risk of secondary amenorrhea if:  You have a family history of this condition.  You have an eating disorder.  You do athletic training.  How is this diagnosed? A diagnosis is made by your health care provider taking a medical history and doing a physical exam. This will include a pelvic exam to check for problems with your reproductive organs. Pregnancy must be ruled out. Often, numerous blood tests are done to measure different hormones in the body. Urine testing may be done. Specialized exams (ultrasound, CT scan, MRI, or hysteroscopy) may have to be done as well as measuring the body mass index (BMI). How is this treated? Treatment depends on the cause of the amenorrhea. If an eating disorder is present, this can be treated with an adequate diet and therapy. Chronic illnesses may improve with treatment of the illness. Amenorrhea may be corrected with medicines, lifestyle changes, or surgery. If the amenorrhea cannot be corrected, it is sometimes possible to create a  false menstruation with medicines. Follow these instructions at home:  Maintain a healthy diet.  Manage weight problems.  Exercise regularly but not excessively.  Get adequate sleep.  Manage stress.  Be aware of changes in your menstrual cycle. Keep a record of when your periods occur. Note the date your period starts, how long it lasts, and any problems. Contact a health care provider if: Your symptoms do not get better with treatment. This information is not intended to replace advice given to you by your health care provider. Make sure you discuss any questions you have with your health care provider. Document Released: 04/05/2006 Document Revised: 07/31/2015 Document Reviewed: 08/10/2012 Elsevier Interactive Patient Education  2018 [employer]  
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
## 2017-09-22 NOTE — Progress Notes (Signed)
CSW received consult and spoke with bedside nurse via telephone.  Per bedside nurse patient is not interested in speaking with CSW and communicated that patient has resources. CSW encouraged bedside to inform patient that CSW available if patient changes her mind.   There are no barriers to discharge.   Laurey Arrow, MSW, LCSW Clinical Social Work [phone]
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
## 2017-09-23 LAB — GC/CHLAMYDIA PROBE AMP (~~LOC~~) NOT AT ARMC
CHLAMYDIA, DNA PROBE: NEGATIVE
NEISSERIA GONORRHEA: NEGATIVE

## 2017-12-07 ENCOUNTER — Emergency Department (HOSPITAL_COMMUNITY)
Admission: EM | Admit: 2017-12-07 | Discharge: 2017-12-07 | Disposition: A | Discharge status: Home / Self Care | Payer: Medicaid Other | Attending: Emergency Medicine | Admitting: Emergency Medicine

## 2017-12-07 ENCOUNTER — Emergency Department (HOSPITAL_COMMUNITY): Payer: Medicaid Other

## 2017-12-07 ENCOUNTER — Encounter (HOSPITAL_COMMUNITY): Payer: Self-pay | Admitting: Emergency Medicine

## 2017-12-07 ENCOUNTER — Other Ambulatory Visit: Payer: Self-pay

## 2017-12-07 DIAGNOSIS — N760 Acute vaginitis: Secondary | ICD-10-CM | POA: Diagnosis not present

## 2017-12-07 DIAGNOSIS — F17210 Nicotine dependence, cigarettes, uncomplicated: Secondary | ICD-10-CM | POA: Diagnosis not present

## 2017-12-07 DIAGNOSIS — O9989 Other specified diseases and conditions complicating pregnancy, childbirth and the puerperium: Secondary | ICD-10-CM | POA: Insufficient documentation

## 2017-12-07 DIAGNOSIS — J45909 Unspecified asthma, uncomplicated: Secondary | ICD-10-CM | POA: Diagnosis not present

## 2017-12-07 DIAGNOSIS — O99331 Smoking (tobacco) complicating pregnancy, first trimester: Secondary | ICD-10-CM | POA: Insufficient documentation

## 2017-12-07 DIAGNOSIS — R102 Pelvic and perineal pain: Secondary | ICD-10-CM | POA: Insufficient documentation

## 2017-12-07 DIAGNOSIS — O26899 Other specified pregnancy related conditions, unspecified trimester: Secondary | ICD-10-CM | POA: Diagnosis present

## 2017-12-07 DIAGNOSIS — Z3A09 9 weeks gestation of pregnancy: Secondary | ICD-10-CM | POA: Insufficient documentation

## 2017-12-07 DIAGNOSIS — W19XXXA Unspecified fall, initial encounter: Secondary | ICD-10-CM

## 2017-12-07 DIAGNOSIS — O24415 Gestational diabetes mellitus in pregnancy, controlled by oral hypoglycemic drugs: Secondary | ICD-10-CM | POA: Diagnosis not present

## 2017-12-07 DIAGNOSIS — W010XXA Fall on same level from slipping, tripping and stumbling without subsequent striking against object, initial encounter: Secondary | ICD-10-CM | POA: Diagnosis not present

## 2017-12-07 DIAGNOSIS — B9689 Other specified bacterial agents as the cause of diseases classified elsewhere: Secondary | ICD-10-CM | POA: Diagnosis not present

## 2017-12-07 LAB — CBG MONITORING, ED: GLUCOSE-CAPILLARY: 79 mg/dL (ref 70–99)

## 2017-12-07 LAB — CBC
HCT: 34.5 % — ABNORMAL LOW (ref 36.0–46.0)
Hemoglobin: 11.4 g/dL — ABNORMAL LOW (ref 12.0–15.0)
MCH: 29.9 pg (ref 26.0–34.0)
MCHC: 33 g/dL (ref 30.0–36.0)
MCV: 90.6 fL (ref 78.0–100.0)
PLATELETS: 292 10*3/uL (ref 150–400)
RBC: 3.81 MIL/uL — AB (ref 3.87–5.11)
RDW: 16.1 % — ABNORMAL HIGH (ref 11.5–15.5)
WBC: 6.6 10*3/uL (ref 4.0–10.5)

## 2017-12-07 LAB — BASIC METABOLIC PANEL
Anion gap: 8 (ref 5–15)
BUN: 8 mg/dL (ref 6–20)
CO2: 22 mmol/L (ref 22–32)
Calcium: 9.1 mg/dL (ref 8.9–10.3)
Chloride: 105 mmol/L (ref 98–111)
Creatinine, Ser: 0.73 mg/dL (ref 0.44–1.00)
GFR calc non Af Amer: >60 mL/min (ref >60)
Glucose, Bld: 92 mg/dL (ref 70–99)
POTASSIUM: 4 mmol/L (ref 3.5–5.1)
SODIUM: 135 mmol/L (ref 135–145)

## 2017-12-07 LAB — WET PREP, GENITAL
Sperm: NONE SEEN
TRICH WET PREP: NONE SEEN
Yeast Wet Prep HPF POC: NONE SEEN

## 2017-12-07 LAB — HCG, QUANTITATIVE, PREGNANCY: hCG, Beta Chain, Quant, S: 78920 m[IU]/mL — ABNORMAL HIGH (ref <5)

## 2017-12-07 LAB — GC/CHLAMYDIA PROBE AMP (~~LOC~~) NOT AT ARMC
Chlamydia: NEGATIVE
Neisseria Gonorrhea: NEGATIVE

## 2017-12-07 MED ORDER — METRONIDAZOLE 500 MG PO TABS: 500.0000 mg | ORAL_TABLET | Freq: Two times a day (BID) | ORAL | 0 refills | Status: DC

## 2017-12-07 MED ORDER — ACETAMINOPHEN 325 MG PO TABS
650.0000 mg | ORAL_TABLET | Freq: Once | ORAL | Status: AC
  Administered 2017-12-07: 650 mg via ORAL
  Filled 2017-12-07: qty 2

## 2017-12-07 MED ORDER — PRENATAL VITAMIN 27-0.8 MG PO TABS: 1.0000 | ORAL_TABLET | Freq: Every day | ORAL | 2 refills | Status: AC

## 2017-12-07 NOTE — ED Triage Notes (Addendum)
Pt reports walking to work when she slipped and fell off curb. Pt reports hurting her right hip and the left side of her stomach. Pt reports she is [redacted] weeks pregnant. PT ambulatory to the ambulance and ambulatory into the ED.

## 2017-12-07 NOTE — Discharge Instructions (Addendum)
Do not drink alcohol during pregnancy.  Do not drink alcohol with this medication in particular as it can make you violently ill.  He may take Tylenol if you are having pain.

## 2017-12-07 NOTE — ED Notes (Signed)
CBG 79 
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
## 2017-12-27 ENCOUNTER — Inpatient Hospital Stay (HOSPITAL_COMMUNITY)
Admission: AD | Admit: 2017-12-27 | Discharge: 2017-12-27 | Disposition: A | Discharge status: Home / Self Care | Payer: Medicaid Other | Source: Ambulatory Visit | Attending: Obstetrics and Gynecology | Admitting: Obstetrics and Gynecology

## 2017-12-27 ENCOUNTER — Encounter (HOSPITAL_COMMUNITY): Payer: Self-pay | Admitting: *Deleted

## 2017-12-27 ENCOUNTER — Other Ambulatory Visit: Payer: Self-pay

## 2017-12-27 DIAGNOSIS — G479 Sleep disorder, unspecified: Secondary | ICD-10-CM | POA: Insufficient documentation

## 2017-12-27 DIAGNOSIS — O99341 Other mental disorders complicating pregnancy, first trimester: Secondary | ICD-10-CM | POA: Diagnosis not present

## 2017-12-27 DIAGNOSIS — O26891 Other specified pregnancy related conditions, first trimester: Secondary | ICD-10-CM | POA: Insufficient documentation

## 2017-12-27 DIAGNOSIS — Z79899 Other long term (current) drug therapy: Secondary | ICD-10-CM | POA: Insufficient documentation

## 2017-12-27 DIAGNOSIS — Z792 Long term (current) use of antibiotics: Secondary | ICD-10-CM | POA: Insufficient documentation

## 2017-12-27 DIAGNOSIS — Z91018 Allergy to other foods: Secondary | ICD-10-CM | POA: Diagnosis not present

## 2017-12-27 DIAGNOSIS — O99331 Smoking (tobacco) complicating pregnancy, first trimester: Secondary | ICD-10-CM | POA: Diagnosis not present

## 2017-12-27 DIAGNOSIS — Z3A12 12 weeks gestation of pregnancy: Secondary | ICD-10-CM | POA: Insufficient documentation

## 2017-12-27 DIAGNOSIS — F419 Anxiety disorder, unspecified: Secondary | ICD-10-CM | POA: Insufficient documentation

## 2017-12-27 DIAGNOSIS — Z833 Family history of diabetes mellitus: Secondary | ICD-10-CM | POA: Insufficient documentation

## 2017-12-27 DIAGNOSIS — F1220 Cannabis dependence, uncomplicated: Secondary | ICD-10-CM | POA: Insufficient documentation

## 2017-12-27 DIAGNOSIS — Z886 Allergy status to analgesic agent status: Secondary | ICD-10-CM | POA: Diagnosis not present

## 2017-12-27 DIAGNOSIS — R1013 Epigastric pain: Secondary | ICD-10-CM | POA: Diagnosis present

## 2017-12-27 DIAGNOSIS — F17210 Nicotine dependence, cigarettes, uncomplicated: Secondary | ICD-10-CM | POA: Diagnosis not present

## 2017-12-27 DIAGNOSIS — R12 Heartburn: Secondary | ICD-10-CM | POA: Diagnosis not present

## 2017-12-27 DIAGNOSIS — N760 Acute vaginitis: Secondary | ICD-10-CM | POA: Insufficient documentation

## 2017-12-27 HISTORY — DX: Bipolar disorder, unspecified: F31.9

## 2017-12-27 LAB — URINALYSIS, ROUTINE W REFLEX MICROSCOPIC
Bilirubin Urine: NEGATIVE
Hgb urine dipstick: NEGATIVE
Ketones, ur: NEGATIVE mg/dL
Leukocytes, UA: NEGATIVE
NITRITE: NEGATIVE
PH: 6 (ref 5.0–8.0)
Protein, ur: NEGATIVE mg/dL
SPECIFIC GRAVITY, URINE: 1.012 (ref 1.005–1.030)

## 2017-12-27 LAB — COMPREHENSIVE METABOLIC PANEL
ALT: 16 U/L (ref 0–44)
ANION GAP: 8 (ref 5–15)
AST: 20 U/L (ref 15–41)
Albumin: 3 g/dL — ABNORMAL LOW (ref 3.5–5.0)
Alkaline Phosphatase: 34 U/L — ABNORMAL LOW (ref 38–126)
BUN: 10 mg/dL (ref 6–20)
CHLORIDE: 107 mmol/L (ref 98–111)
CO2: 20 mmol/L — AB (ref 22–32)
Calcium: 8.9 mg/dL (ref 8.9–10.3)
Creatinine, Ser: 0.63 mg/dL (ref 0.44–1.00)
GFR calc non Af Amer: >60 mL/min (ref >60)
Glucose, Bld: 146 mg/dL — ABNORMAL HIGH (ref 70–99)
Potassium: 3.6 mmol/L (ref 3.5–5.1)
SODIUM: 135 mmol/L (ref 135–145)
Total Bilirubin: 0.4 mg/dL (ref 0.3–1.2)
Total Protein: 6.3 g/dL — ABNORMAL LOW (ref 6.5–8.1)

## 2017-12-27 LAB — CBC
HCT: 32.9 % — ABNORMAL LOW (ref 36.0–46.0)
Hemoglobin: 11.2 g/dL — ABNORMAL LOW (ref 12.0–15.0)
MCH: 30.5 pg (ref 26.0–34.0)
MCHC: 34 g/dL (ref 30.0–36.0)
MCV: 89.6 fL (ref 80.0–100.0)
PLATELETS: 286 10*3/uL (ref 150–400)
RBC: 3.67 MIL/uL — AB (ref 3.87–5.11)
RDW: 16.8 % — ABNORMAL HIGH (ref 11.5–15.5)
WBC: 7.4 10*3/uL (ref 4.0–10.5)

## 2017-12-27 LAB — GLUCOSE, CAPILLARY: Glucose-Capillary: 154 mg/dL — ABNORMAL HIGH (ref 70–99)

## 2017-12-27 MED ORDER — FAMOTIDINE 20 MG PO TABS: 20.0000 mg | ORAL_TABLET | Freq: Two times a day (BID) | ORAL | 1 refills | Status: DC

## 2017-12-27 MED ORDER — ALUM & MAG HYDROXIDE-SIMETH 200-200-20 MG/5ML PO SUSP
30.0000 mL | Freq: Once | ORAL | Status: AC
  Administered 2017-12-27: 30 mL via ORAL
  Filled 2017-12-27: qty 30

## 2017-12-27 NOTE — Discharge Instructions (Signed)
Type 1 or Type 2 Diabetes Mellitus During Pregnancy, Self Care When you have type 1 or type 2 diabetes (diabetes mellitus), you must keep your blood sugar (glucose) under control. You can do this with:  Nutrition.  Exercise.  Lifestyle changes.  Insulin or medicines, if needed.  Support from your doctors and others.  How do I manage my blood sugar?  Check your blood sugar every day, as often as told.  Call your doctor if your blood sugar is above your goal numbers for 2 tests in a row.  Have your A1c (hemoglobin A1c) level checked at least two times a year. Have it checked more often if your doctor tells you to do that. Your doctor will set treatment goals for you. In general, you should have these blood sugar levels:  After not eating for a long time (fasting): 95 mg/dL (5.3 mmol/L).  After meals (postprandial): ? One hour after a meal: at or below 140 mg/dL (7.8 mmol/L). ? Two hours after a meal: at or below 120 mg/dL (6.7 mmol/L).  A1c level: 6-6.5%.  What do I need to know about high blood sugar? High blood sugar is called hyperglycemia. Know the signs of high blood sugar. Signs may include:  Feeling: ? Thirsty. ? Hungry. ? Very tired.  Needing to pee (urinate) more than usual.  Blurry vision.  What do I need to know about low blood sugar? Low blood sugar is called hypoglycemia. This is when blood sugar is at or below 70 mg/dL (3.9 mmol/L). Symptoms may include:  Feeling: ? Hungry. ? Worried or nervous (anxious). ? Sweaty and clammy. ? Confused. ? Dizzy. ? Sleepy. ? Sick to your stomach (nauseous).  Having: ? A fast heartbeat. ? A headache. ? A change in your vision. ? Jerky movements that you cannot control (seizure). ? Nightmares. ? Tingling or no feeling (numbness) around the mouth, lips, or tongue.  Having trouble with: ? Talking. ? Paying attention (concentrating). ? Moving (coordination). ? Sleeping.  Shaking.  Passing out  (fainting).  Getting upset easily (irritability).  Treating low blood sugar  To treat low blood sugar, eat or drink something sugary right away. If you can think clearly and swallow safely, follow the 15:15 rule:  Take 15 grams of a fast-acting carb (carbohydrate). Some fast-acting carbs are: ? 1 tube of glucose gel. ? 3 sugar tablets (glucose pills). ? 6-8 pieces of hard candy. ? 4 oz (120 mL) of fruit juice. ? 4 oz (120 mL) regular (not diet) soda.  Check your blood sugar 15 minutes after you take the carb.  If your blood sugar is still at or below 70 mg/dL (3.9 mmol/L), take 15 grams of a carb again.  If your blood sugar does not go above 70 mg/dL (3.9 mmol/L) after 3 tries, get help right away.  After your blood sugar goes back to normal, eat a meal or a snack within 1 hour.  Treating very low blood sugar If your blood sugar is at or below 54 mg/dL (3 mmol/L), you have very low blood sugar (severe hypoglycemia). This is an emergency. Do not wait to see if the symptoms will go away. Get medical help right away. Call your local emergency services (911 in the [place] Do not drive yourself to the hospital. If you have very low blood sugar and you cannot eat or drink, you may need a glucagon shot (injection). A family member or friend should learn:  How to check your blood sugar.  How to give you a glucagon shot.  Ask your doctor if you need a glucagon shot kit at home. What else is important to manage my diabetes? Medicine Follow these instructions about insulin and diabetes medicines:  Take them as told by your doctor.  Adjust them as told by your doctor.  Do not run out of them.  Having diabetes can put you at risk for other long-term (chronic) conditions. These may include heart disease and kidney disease. Your doctor may prescribe medicines to help prevent problems from diabetes. Food   Make healthy food choices. These include: ? Chicken, fish, egg whites, and  beans. ? Oats, whole wheat, bulgur, brown rice, quinoa, and millet. ? Fresh fruits and vegetables. ? Low-fat dairy products. ? Nuts, avocado, olive oil, and canola oil.  Meet with a food specialist (dietitian) to make an eating plan that is right for you.  Follow instructions from your doctor about what you cannot eat or drink.  Drink enough fluid to keep your pee (urine) clear or pale yellow.  Eat healthy snacks between healthy meals.  Keep track of the carbs you eat. Read food labels. Learn the standard serving sizes of foods.  Follow your sick day plan when you cannot eat or drink normally. Make this plan with your doctor so it is ready. Activity  Exercise for 30 minutes or more a day during your pregnancy or as much as told by your doctor.  Talk with your doctor before you start a new exercise. Your doctor may need to adjust your insulin, medicines, or food. Lifestyle   Do not drink alcohol.  Do not use any tobacco products, such as cigarettes, chewing tobacco, and e-cigarettes. If you need help quitting, ask your doctor.  Learn how to deal with stress. If you need help with this, ask your doctor. Body care  Stay up to date with your shots (immunizations).  Get an eye exam during your first trimester.  Check your skin and feet every day. Check for cuts, bruises, redness, blisters, or sores.  Get regular foot exams as told by your doctor.  Brush your teeth and gums two times a day. Floss at least one time a day.  Go to the dentist least once every 6 months.  Stay at a healthy weight during your pregnancy. General instructions   Take over-the-counter and prescription medicines only as told by your doctor.  Talk with your doctor about your risk for high blood pressure during pregnancy (preeclampsia or eclampsia).  Share your diabetes care plan with: ? Your work or school. ? People you live with.  Check your pee for ketones: ? When you are sick. ? As told by  your doctor.  Carry a card or wear jewelry that says that you have diabetes.  Ask your doctor: ? Do I need to meet with a diabetes educator? ? Where can I find a support group for people with diabetes?  Keep all follow-up visits with your doctor. This is important. Where to find more information: To learn more about diabetes, visit:  American Diabetes Association: www.diabetes.org  American Association of Diabetes Educators (AADE): www.diabeteseducator.org/patient-resources  This information is not intended to replace advice given to you by your health care provider. Make sure you discuss any questions you have with your health care provider. Document Released: 06/16/2015 Document Revised: 01/07/2016 Document Reviewed: 03/28/2015 Elsevier Interactive Patient Education  2017 [employer] Heartburn During Pregnancy Heartburn is a type of pain or discomfort that can happen in  the throat or chest. It is often described as a burning sensation. Heartburn is common during pregnancy because:  A hormone (progesterone) that is released during pregnancy may relax the valve (lower esophageal sphincter, or LES) that separates the esophagus from the stomach. This allows stomach acid to move up into the esophagus, causing heartburn.  The uterus gets larger and pushes up on the stomach, which pushes more acid into the esophagus. This is especially true in the later stages of pregnancy.  Heartburn usually goes away or gets better after giving birth. What are the causes? Heartburn is caused by stomach acid backing up into the esophagus (reflux). Reflux can be triggered by:  Changing hormone levels.  Large meals.  Certain foods and beverages, such as coffee, chocolate, onions, and peppermint.  Exercise.  Increased stomach acid production.  What increases the risk? You are more likely to experience heartburn during pregnancy if you:  Had heartburn prior to becoming pregnant.  Have been  pregnant more than once before.  Are overweight or obese.  The likelihood that you will get heartburn also increases as you get farther along in your pregnancy, especially during the last trimester. What are the signs or symptoms? Symptoms of this condition include:  Burning pain in the chest or lower throat.  Bitter taste in the mouth.  Coughing.  Problems swallowing.  Vomiting.  Hoarse voice.  Asthma.  Symptoms may get worse when you lie down or bend over. Symptoms are often worse at night. How is this diagnosed? This condition is diagnosed based on:  Your medical history.  Your symptoms.  Blood tests to check for a certain type of bacteria associated with heartburn.  Whether taking heartburn medicine relieves your symptoms.  Examination of the stomach and esophagus using a tube with a light and camera on the end (endoscopy).  How is this treated? Treatment varies depending on how severe your symptoms are. Your health care provider may recommend:  Over-the-counter medicines (antacids or acid reducers) for mild heartburn.  Prescription medicines to decrease stomach acid or to protect your stomach lining.  Certain changes in your diet.  Raising the head of your bed so it is higher than the foot of the bed. This helps prevent stomach acid from backing up into the esophagus when you are lying down.  Follow these instructions at home: Eating and drinking  Do not drink alcohol during your pregnancy.  Identify foods and beverages that make your symptoms worse, and avoid them.  Beverages that you may want to avoid include: ? Coffee and tea (with or without caffeine). ? Energy drinks and sports drinks. ? Carbonated drinks or sodas. ? Citrus fruit juices.  Foods that you may want to avoid include: ? Chocolate and cocoa. ? Peppermint and mint flavorings. ? Garlic, onions, and horseradish. ? Spicy and acidic foods, including peppers, chili powder, curry powder,  vinegar, hot sauces, and barbecue sauce. ? Citrus fruits, such as oranges, lemons, and limes. ? Tomato-based foods, such as red sauce, chili, and salsa. ? Fried and fatty foods, such as donuts, french fries, potato chips, and high-fat dressings. ? High-fat meats, such as hot dogs, cold cuts, sausage, ham, and bacon. ? High-fat dairy items, such as whole milk, butter, and cheese.  Eat small, frequent meals instead of large meals.  Avoid drinking large amounts of liquid with your meals.  Avoid eating meals during the 2-3 hours before bedtime.  Avoid lying down right after you eat.  Do not exercise right  after you eat. Medicines  Take over-the-counter and prescription medicines only as told by your health care provider.  Do not take aspirin, ibuprofen, or other NSAIDs unless your health care provider tells you to do that.  You may be instructed to avoid medicines that contain sodium bicarbonate. General instructions  If directed, raise the head of your bed about 6 inches (15 cm) by putting blocks under the legs. Sleeping with more pillows does not effectively relieve heartburn because it only changes the position of your head.  Do not use any products that contain nicotine or tobacco, such as cigarettes and e-cigarettes. If you need help quitting, ask your health care provider.  Wear loose-fitting clothing.  Try to reduce your stress, such as with yoga or meditation. If you need help managing stress, ask your health care provider.  Maintain a healthy weight. If you are overweight, work with your health care provider to safely lose weight.  Keep all follow-up visits as told by your health care provider. This is important. Contact a health care provider if:  You develop new symptoms.  Your symptoms do not improve with treatment.  You have unexplained weight loss.  You have difficulty swallowing.  You make loud sounds when you breathe (wheeze).  You have a cough that does  not go away.  You have frequent heartburn for more than 2 weeks.  You have nausea or vomiting that does not get better with treatment.  You have pain in your abdomen. Get help right away if:  You have severe chest pain that spreads to your arm, neck, or jaw.  You feel sweaty, dizzy, or light-headed.  You have shortness of breath.  You have pain when swallowing.  You vomit, and your vomit looks like blood or coffee grounds.  Your stool is bloody or black. This information is not intended to replace advice given to you by your health care provider. Make sure you discuss any questions you have with your health care provider. Document Released: 02/20/2000 Document Revised: 11/10/2015 Document Reviewed: 11/10/2015 Elsevier Interactive Patient Education  2018 [employer].

## 2017-12-27 NOTE — Progress Notes (Signed)
Pt informed pharmacy tech that she wasn't feeling well and sugar may be low because she took her Insulin without eating.

## 2017-12-27 NOTE — Progress Notes (Signed)
RN into eval pt status.  Pt reports she ate pancakes @ 0700 and took Insulin @ 0830 this morning. CBG 154mg /dl

## 2017-12-27 NOTE — MAU Provider Note (Signed)
Chief Complaint: Abdominal Pain   First Provider Initiated Contact with Patient 12/27/17 1102     SUBJECTIVE HPI: Grace Montgomery is a 27 y.o. [id number] at [redacted]w[redacted]d who presents to M[facility] Admissions reporting abdominal pain. Symptoms began last night. Reports epigastric pain. States she's also had bad heartburn during the pregnancy. Denies n/v/d/, fever/chills, lower abdominal pain.  Waiting to hear back from [facility] for new ob appointment.  Location: mid epigastric  Quality: sore Severity: 7/10 on pain scale Duration: 1 days Timing: constant Modifying factors: nothing makes better or worse Associated signs and symptoms: none  Past Medical History:  Diagnosis Date  . Anxiety   . Asthma   . Bipolar disorder ([place])   . Cannabis dependence   . Gestational diabetes   . 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 NO PAST SURGERIES     Social History   Socioeconomic History  . Marital status: Single    Spouse name: Not on file  . Number of children: 2  . Years of education: Not on file  . Highest education level: Not on file  Occupational History  . Not on file  Social Needs  . Financial resource strain: Not on file  . Food insecurity:    Worry: Not on file    Inability: Not on file  . Transportation needs:    Medical: Not on file    Non-medical: Not on file  Tobacco Use  . Smoking status: Current Every Day Smoker    Packs/day: 0.25    Types: Cigarettes  . Smokeless tobacco: Never Used  Substance and Sexual Activity  . Alcohol use: Not Currently    Comment: ocassionally  . Drug use: Not Currently    Comment: History of Marijuana  . Sexual  activity: Yes    Birth control/protection: None  Lifestyle  . Physical activity:    Days per week: Not on file    Minutes per session: Not on file  . Stress: Not on file  Relationships  . Social connections:    Talks on phone: Not on file    Gets together: Not on file    Attends religious service: Not on file    Active member of club or organization: Not on file    Attends meetings of clubs or organizations: Not on file    Relationship status: Not on file  . Intimate partner violence:    Fear of current or ex partner: Not on file    Emotionally abused: Not on file    Physically abused: Not on file    Forced sexual activity: Not on file  Other Topics Concern  . Not on file  Social History Narrative   Student for psychotherapy   2 Children - Boy(4yo) & Girl (3yo)         Family History  Problem Relation Age of Onset  . Sickle cell anemia Mother   . Depression Mother   .  Diabetes Mother   . Diabetes Other        grandparents and aunts/uncles  . Stroke Other        grandparent   No current facility-administered medications on file prior to encounter.    Current Outpatient Medications on File Prior to Encounter  Medication Sig Dispense Refill  . insulin NPH-regular Human (NOVOLIN 70/30) (70-30) 100 UNIT/ML injection Inject 10 Units into the skin 2 (two) times daily with a meal.    . Prenatal Vit-Fe Fumarate-FA (PRENATAL VITAMIN) 27-0.8 MG TABS Take 1 tablet by mouth daily. 30 tablet 2   Allergies  Allergen Reactions  . Monistat [Tioconazole] Swelling  . Chocolate Itching and Other (See Comments)    Ears & throat itch  . Banana Itching and Other (See Comments)    Throat itching    I have reviewed patient's Past Medical Hx, Surgical Hx, Family Hx, Social Hx, medications and allergies.   Review of Systems  Constitutional: Negative.   Gastrointestinal: Positive for abdominal pain. Negative for constipation, diarrhea, nausea and vomiting.  Genitourinary: Negative.      OBJECTIVE Patient Vitals for the past 24 hrs:  BP Temp Temp src Pulse Resp SpO2 Height Weight  12/27/17 1230 132/76 98.5 F (36.9 C) Oral 88 - 100 % - -  12/27/17 1031 128/67 98.3 F (36.8 C) Oral - 20 99 % - -  12/27/17 1020 - - - - - - 5\' 3"  (1.6 m) 95.9 kg   Constitutional: Well-developed, well-nourished female in no acute distress.  Cardiovascular: normal rate & rhythm, no murmur Respiratory: normal rate and effort. Lung sounds clear throughout GI: TTP in mid epigastric area. Abd soft, Pos BS x 4. No guarding or rebound tenderness. Neg murphys sign MS: Extremities nontender, no edema, normal ROM Neurologic: Alert and oriented x 4.     LAB RESULTS Results for orders placed or performed during the hospital encounter of 12/27/17 (from the past 24 hour(s))  Urinalysis, Routine w reflex microscopic     Status: Abnormal   Collection Time: 12/27/17 10:31 AM  Result Value Ref Range   Color, Urine YELLOW YELLOW   APPearance CLEAR CLEAR   Specific Gravity, Urine 1.012 1.005 - 1.030   pH 6.0 5.0 - 8.0   Glucose, UA >=500 (A) NEGATIVE mg/dL   Hgb urine dipstick NEGATIVE NEGATIVE   Bilirubin Urine NEGATIVE NEGATIVE   Ketones, ur NEGATIVE NEGATIVE mg/dL   Protein, ur NEGATIVE NEGATIVE mg/dL   Nitrite NEGATIVE NEGATIVE   Leukocytes, UA NEGATIVE NEGATIVE   RBC / HPF 0-5 0 - 5 RBC/hpf   WBC, UA 0-5 0 - 5 WBC/hpf   Bacteria, UA RARE (A) NONE SEEN   Squamous Epithelial / LPF 0-5 0 - 5  CBC     Status: Abnormal   Collection Time: 12/27/17 11:30 AM  Result Value Ref Range   WBC 7.4 4.0 - 10.5 K/uL   RBC 3.67 (L) 3.87 - 5.11 MIL/uL   Hemoglobin 11.2 (L) 12.0 - 15.0 g/dL   HCT 32.9 (L) 36.0 - 46.0 %   MCV 89.6 80.0 - 100.0 fL   MCH 30.5 26.0 - 34.0 pg   MCHC 34.0 30.0 - 36.0 g/dL   RDW 16.8 (H) 11.5 - 15.5 %   Platelets 286 150 - 400 K/uL  Comprehensive metabolic panel     Status: Abnormal   Collection Time: 12/27/17 11:30 AM  Result Value Ref Range   Sodium 135 135 - 145  mmol/L   Potassium 3.6  3.5 - 5.1 mmol/L   Chloride 107 98 - 111 mmol/L   CO2 20 (L) 22 - 32 mmol/L   Glucose, Bld 146 (H) 70 - 99 mg/dL   BUN 10 6 - 20 mg/dL   Creatinine, Ser 0.63 0.44 - 1.00 mg/dL   Calcium 8.9 8.9 - 10.3 mg/dL   Total Protein 6.3 (L) 6.5 - 8.1 g/dL   Albumin 3.0 (L) 3.5 - 5.0 g/dL   AST 20 15 - 41 U/L   ALT 16 0 - 44 U/L   Alkaline Phosphatase 34 (L) 38 - 126 U/L   Total Bilirubin 0.4 0.3 - 1.2 mg/dL   GFR calc non Af Amer >60 >60 mL/min   GFR calc Af Amer >60 >60 mL/min   Anion gap 8 5 - 15  Glucose, capillary     Status: Abnormal   Collection Time: 12/27/17 12:08 PM  Result Value Ref Range   Glucose-Capillary 154 (H) 70 - 99 mg/dL    IMAGING No results found.  MAU COURSE Orders Placed This Encounter  Procedures  . Urinalysis, Routine w reflex microscopic  . CBC  . Comprehensive metabolic panel  . Glucose, capillary  . Discharge patient   Meds ordered this encounter  Medications  . alum & mag hydroxide-simeth (MAALOX/MYLANTA) 200-200-20 MG/5ML suspension 30 mL  . famotidine (PEPCID) 20 MG tablet    Sig: Take 1 tablet (20 mg total) by mouth 2 (two) times daily.    Dispense:  60 tablet    Refill:  1    Order Specific Question:   Supervising Provider    Answer:   ERVIN, MICHAEL L [[id number]]    MDM FHT 153 by doppler CBC & CMP ok. Elevated BS, pt had pancakes this morning. Will send msg to [facility] for pt to start care ASAP to monitor BS.  Maalox given & pt reports resolution of pain.   ASSESSMENT 1. Heartburn during pregnancy in first trimester   2. [redacted] weeks gestation of pregnancy     PLAN Discharge home in stable condition. Rx pepcid  Follow-up Information    [facility].   Contact information: [place] [address] [phone]         Allergies as of 12/27/2017      Reactions   Monistat [tioconazole] Swelling   Chocolate Itching, Other (See Comments)   Ears &  throat itch   Banana Itching, Other (See Comments)   Throat itching      Medication List    STOP taking these medications   metroNIDAZOLE 500 MG tablet Commonly known as:  FLAGYL     TAKE these medications   famotidine 20 MG tablet Commonly known as:  PEPCID Take 1 tablet (20 mg total) by mouth 2 (two) times daily.   insulin NPH-regular Human (70-30) 100 UNIT/ML injection Commonly known as:  NOVOLIN 70/30 Inject 10 Units into the skin 2 (two) times daily with a meal.   Prenatal Vitamin 27-0.8 MG Tabs Take 1 tablet by mouth daily.        Jorje Guild, NP 12/27/2017  8:36 PM

## 2017-12-27 NOTE — MAU Provider Note (Signed)
History   Pt presents with epigastric pain that began yesterday at 5pm and has been constant since. Reports she vomited once 2 days ago. Reports sleep disturbance. Denies nausea, fever, diarrhea, constipation, change in appetite. Denies taking anything for the pain. Pt taking Flagyl for bacterial vaginosis diagnosed 10/2.  CSN: [id number]  Arrival date and time: 12/27/17 1011   First Provider Initiated Contact with Patient 12/27/17 1102      Chief Complaint  Patient presents with  . Abdominal Pain   HPI  OB History    Gravida  3   Para  2   Term  0   Preterm  2   AB  0   Living  2     SAB  0   TAB  0   Ectopic  0   Multiple  0   Live Births  2           Past Medical History:  Diagnosis Date  . Anxiety   . Asthma   . Bipolar disorder ([place])   . Cannabis dependence   . Gestational diabetes   . 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 NO PAST SURGERIES      Family History  Problem Relation Age of Onset  . Sickle cell anemia Mother   . Depression Mother   . Diabetes Mother   . Diabetes Other        grandparents and aunts/uncles  . Stroke Other        grandparent    Social History   Tobacco Use  . Smoking status: Current Every Day Smoker    Packs/day: 0.25    Types: Cigarettes  . Smokeless tobacco: Never Used  Substance Use Topics  . Alcohol use: Not Currently    Comment: ocassionally  . Drug use: Not Currently    Comment: History of Marijuana    Allergies:  Allergies  Allergen Reactions  . Monistat [Tioconazole] Swelling  . Chocolate Itching and Other (See Comments)    Ears & throat itch  . Banana Itching and Other (See Comments)    Throat itching    Medications Prior to Admission  Medication Sig Dispense Refill Last Dose  . hydrOXYzine (VISTARIL) 25 MG capsule Take 25 mg by mouth 3 (three) times daily as needed for anxiety.   03/18/2017 at Unknown  time  . metFORMIN (GLUCOPHAGE) 500 MG tablet Take 500 mg by mouth 2 (two) times daily.   03/19/2017 at Unknown time  . metroNIDAZOLE (FLAGYL) 500 MG tablet Take 1 tablet (500 mg total) by mouth 2 (two) times daily. One po bid x 7 days 14 tablet 0   . Prenatal Vit-Fe Fumarate-FA (PRENATAL VITAMIN) 27-0.8 MG TABS Take 1 tablet by mouth daily. 30 tablet 2     Review of Systems  As stated in the HPI. Physical Exam   Blood pressure 128/67, temperature 98.3 F (36.8 C), temperature source Oral, resp. rate 20, height 5\' 3"  (1.6 m), weight 95.9 kg, last menstrual period 06/11/2017, SpO2 99 %.  Physical Exam  General: Pt uncomfortable but in no apparent distress. Abdomen: TTP epigastric region. Normoactive bowel sounds x4. Heart: Regular rate and rhythm.  Lungs: Clear to auscultation. LE: Minimal edema present.  MAU Course  Procedures  MDM Vital signs stable Pt in no apparent distress  Assessment and Plan  CBC and CMP pending. Maalox ordered.  [facility] 12/27/2017, 11:06 AM

## 2017-12-27 NOTE — MAU Note (Addendum)
Pt presents with c/o constant sharp abdominal pain in mid upper abdomen that began began yesterday evening.  Unrelieved with Tylenol..  Denies VB or LOF.

## 2018-01-05 ENCOUNTER — Inpatient Hospital Stay (HOSPITAL_COMMUNITY)
Admission: AD | Admit: 2018-01-05 | Discharge: 2018-01-05 | Disposition: A | Discharge status: Home / Self Care | Payer: Medicaid Other | Source: Ambulatory Visit | Attending: Obstetrics & Gynecology | Admitting: Obstetrics & Gynecology

## 2018-01-05 DIAGNOSIS — O24112 Pre-existing diabetes mellitus, type 2, in pregnancy, second trimester: Secondary | ICD-10-CM | POA: Diagnosis not present

## 2018-01-05 DIAGNOSIS — O24113 Pre-existing diabetes mellitus, type 2, in pregnancy, third trimester: Secondary | ICD-10-CM | POA: Diagnosis present

## 2018-01-05 DIAGNOSIS — E6609 Other obesity due to excess calories: Secondary | ICD-10-CM

## 2018-01-05 HISTORY — DX: Pre-existing type 2 diabetes mellitus, in pregnancy, third trimester: O24.113

## 2018-01-05 MED ORDER — INSULIN NPH (HUMAN) (ISOPHANE) 100 UNIT/ML ~~LOC~~ SUSP: SUBCUTANEOUS | 11 refills | Status: DC

## 2018-01-05 MED ORDER — "INSULIN SYRINGE 27G X 1/2"" 1 ML MISC": 3 refills | Status: DC

## 2018-01-05 MED ORDER — INSULIN REGULAR HUMAN 100 UNIT/ML IJ SOLN: INTRAMUSCULAR | 11 refills | Status: DC

## 2018-01-05 NOTE — MAU Note (Signed)
Pt seen by D.Poe. New Rx for insulin and syringes  Prescribed. Instructions given on how to take Pt verbalized understanding

## 2018-01-05 NOTE — [facility] Provider Note (Addendum)
  Saw patient in triage area SUBJECTIVE: Grace Montgomery is a 27 y.o. [id number] at [redacted]w[redacted]d at [redacted]w[redacted]d sending for refill of insulin Rx because she ran out 1 day ago. History significant for preterm deliveries and gestational diabetes with prior pregnancies.  States she was diagnosed as type 2 diabetic in January 2019 and was then started on metformin.  A month ago metformin was discontinued and she was started on insulin 70/30 10 units SQ qd but was unable to refill it at [facility] today. Last injection was 2 days ago. She checks CBGs once in the morning at once in the evening, last checked yesterday. States lowest CBG 79 and highest 203.  Denies abdominal pain or vaginal bleeding. Seen here 12/27/2017 for abdominal pain when IUP confirmed ant treated for heartburn. RCBG was 154.  Denies other health problems.  Has NOB appointment at [facility] in 1 week. Lives at [facility].  OBJECTIVE: There were no vitals filed for this visit.  Weight:  97kg  Abbreviated exam General: Obese, pleasant female in NAD Abd: soft, NT. DT FHR 144  Reviewed glucometer readings: Most recent 01/04/18 pm not fasting 203, others mostly 140-150, isolated low 79  ASSESSMENT: [id number] at [redacted]w[redacted]d with viable IUP Uncontrolled Type 2 DM on suboptimal regimen  PLAN: Discussed with Dr. Juleen China who agrees to switch to qid CBG checks and change to weight-based split dose regimen. Rx syringes and insulin Briefly reviewed carb restricted diet and reviewed how to draw up insulin  Allergies as of 01/05/2018      Reactions   Monistat [tioconazole] Swelling   Chocolate Itching, Other (See Comments)   Ears & throat itch   Banana Itching, Other (See Comments)   Throat itching      Medication List    STOP taking these medications   insulin NPH-regular Human (70-30) 100 UNIT/ML injection Commonly known as:  NOVOLIN 70/30     TAKE these medications   famotidine 20 MG tablet Commonly known as:  PEPCID Take 1 tablet (20 mg total) by  mouth 2 (two) times daily.   insulin NPH Human 100 UNIT/ML injection Commonly known as:  HUMULIN N,NOVOLIN N Take 17 units SQ qd every morning before breakfast and 12 units SQ qd before dinner   insulin regular 100 units/mL injection Commonly known as:  NOVOLIN R,HUMULIN R Take 8 units SQ qd before breakfast and 12 units SQ qd before dinner   Insulin Syringe 27G X 1/2" 1 ML Misc 77 Syringes by Does not apply route 2 (two) times daily AND 17 Syringes 2 (two) times daily. Insulin N17 units/R8units q am and N12units/R12units acd SQ.   Prenatal Vitamin 27-0.8 MG Tabs Take 1 tablet by mouth daily.      Follow-up Information    [place] Follow up on 01/12/2018.   Contact information: [address] [place] [id number] [phone]

## 2018-01-05 NOTE — Discharge Instructions (Signed)
Diabetes Mellitus and Nutrition When you have diabetes (diabetes mellitus), it is very important to have healthy eating habits because your blood sugar (glucose) levels are greatly affected by what you eat and drink. Eating healthy foods in the appropriate amounts, at about the same times every day, can help you:  Control your blood glucose.  Lower your risk of heart disease.  Improve your blood pressure.  Reach or maintain a healthy weight.  Every person with diabetes is different, and each person has different needs for a meal plan. Your health care provider may recommend that you work with a diet and nutrition specialist (dietitian) to make a meal plan that is best for you. Your meal plan may vary depending on factors such as:  The calories you need.  The medicines you take.  Your weight.  Your blood glucose, blood pressure, and cholesterol levels.  Your activity level.  Other health conditions you have, such as heart or kidney disease.  How do carbohydrates affect me? Carbohydrates affect your blood glucose level more than any other type of food. Eating carbohydrates naturally increases the amount of glucose in your blood. Carbohydrate counting is a method for keeping track of how many carbohydrates you eat. Counting carbohydrates is important to keep your blood glucose at a healthy level, especially if you use insulin or take certain oral diabetes medicines. It is important to know how many carbohydrates you can safely have in each meal. This is different for every person. Your dietitian can help you calculate how many carbohydrates you should have at each meal and for snack. Foods that contain carbohydrates include:  Bread, cereal, rice, pasta, and crackers.  Potatoes and corn.  Peas, beans, and lentils.  Milk and yogurt.  Fruit and juice.  Desserts, such as cakes, cookies, ice cream, and candy.  How does alcohol affect me? Alcohol can cause a sudden decrease in blood  glucose (hypoglycemia), especially if you use insulin or take certain oral diabetes medicines. Hypoglycemia can be a life-threatening condition. Symptoms of hypoglycemia (sleepiness, dizziness, and confusion) are similar to symptoms of having too much alcohol. If your health care provider says that alcohol is safe for you, follow these guidelines:  Limit alcohol intake to no more than 1 drink per day for nonpregnant women and 2 drinks per day for men. One drink equals 12 oz of beer, 5 oz of wine, or 1 oz of hard liquor.  Do not drink on an empty stomach.  Keep yourself hydrated with water, diet soda, or unsweetened iced tea.  Keep in mind that regular soda, juice, and other mixers may contain a lot of sugar and must be counted as carbohydrates.  What are tips for following this plan? Reading food labels  Start by checking the serving size on the label. The amount of calories, carbohydrates, fats, and other nutrients listed on the label are based on one serving of the food. Many foods contain more than one serving per package.  Check the total grams (g) of carbohydrates in one serving. You can calculate the number of servings of carbohydrates in one serving by dividing the total carbohydrates by 15. For example, if a food has 30 g of total carbohydrates, it would be equal to 2 servings of carbohydrates.  Check the number of grams (g) of saturated and trans fats in one serving. Choose foods that have low or no amount of these fats.  Check the number of milligrams (mg) of sodium in one serving. Most people   should limit total sodium intake to less than 2,300 mg per day.  Always check the nutrition information of foods labeled as "low-fat" or "nonfat". These foods may be higher in added sugar or refined carbohydrates and should be avoided.  Talk to your dietitian to identify your daily goals for nutrients listed on the label. Shopping  Avoid buying canned, premade, or processed foods. These  foods tend to be high in fat, sodium, and added sugar.  Shop around the outside edge of the grocery store. This includes fresh fruits and vegetables, bulk grains, fresh meats, and fresh dairy. Cooking  Use low-heat cooking methods, such as baking, instead of high-heat cooking methods like deep frying.  Cook using healthy oils, such as olive, canola, or sunflower oil.  Avoid cooking with butter, cream, or high-fat meats. Meal planning  Eat meals and snacks regularly, preferably at the same times every day. Avoid going long periods of time without eating.  Eat foods high in fiber, such as fresh fruits, vegetables, beans, and whole grains. Talk to your dietitian about how many servings of carbohydrates you can eat at each meal.  Eat 4-6 ounces of lean protein each day, such as lean meat, chicken, fish, eggs, or tofu. 1 ounce is equal to 1 ounce of meat, chicken, or fish, 1 egg, or 1/4 cup of tofu.  Eat some foods each day that contain healthy fats, such as avocado, nuts, seeds, and fish. Lifestyle   Check your blood glucose regularly.  Exercise at least 30 minutes 5 or more days each week, or as told by your health care provider.  Take medicines as told by your health care provider.  Do not use any products that contain nicotine or tobacco, such as cigarettes and e-cigarettes. If you need help quitting, ask your health care provider.  Work with a counselor or diabetes educator to identify strategies to manage stress and any emotional and social challenges. What are some questions to ask my health care provider?  Do I need to meet with a diabetes educator?  Do I need to meet with a dietitian?  What number can I call if I have questions?  When are the best times to check my blood glucose? Where to find more information:  American Diabetes Association: diabetes.org/food-and-fitness/food  Academy of Nutrition and Dietetics:  www.eatright.org/resources/health/diseases-and-conditions/diabetes  National Institute of Diabetes and Digestive and Kidney Diseases (NIH): www.niddk.nih.gov/health-information/diabetes/overview/diet-eating-physical-activity Summary  A healthy meal plan will help you control your blood glucose and maintain a healthy lifestyle.  Working with a diet and nutrition specialist (dietitian) can help you make a meal plan that is best for you.  Keep in mind that carbohydrates and alcohol have immediate effects on your blood glucose levels. It is important to count carbohydrates and to use alcohol carefully. This information is not intended to replace advice given to you by your health care provider. Make sure you discuss any questions you have with your health care provider. Document Released: 11/19/2004 Document Revised: 03/29/2016 Document Reviewed: 03/29/2016 Elsevier Interactive Patient Education  2018 Elsevier Inc.  

## 2018-01-05 NOTE — Progress Notes (Signed)
Pt states she was instructed to come to [facility] by health dept d/t needing prescription for insulin.  Pt states a prescription was sent by the [place] to the health dept & they will not give it to her.  She denies pain or other pregnancy issues & states concern for inability to obtain prescription for insulin as she is a Type II DM.  Report given to Laney Pastor, NP.

## 2018-01-12 ENCOUNTER — Other Ambulatory Visit: Payer: Self-pay | Admitting: Advanced Practice Midwife

## 2018-01-12 ENCOUNTER — Other Ambulatory Visit (HOSPITAL_COMMUNITY)
Admission: RE | Admit: 2018-01-12 | Discharge: 2018-01-12 | Disposition: A | Discharge status: Home / Self Care | Payer: Medicaid Other | Source: Ambulatory Visit | Attending: Advanced Practice Midwife | Admitting: Advanced Practice Midwife

## 2018-01-12 ENCOUNTER — Ambulatory Visit (INDEPENDENT_AMBULATORY_CARE_PROVIDER_SITE_OTHER): Payer: Medicaid Other | Admitting: Advanced Practice Midwife

## 2018-01-12 ENCOUNTER — Encounter: Payer: Self-pay | Admitting: Advanced Practice Midwife

## 2018-01-12 VITALS — BP 117/78 | HR 97 | Wt 221.4 lb

## 2018-01-12 DIAGNOSIS — O24112 Pre-existing diabetes mellitus, type 2, in pregnancy, second trimester: Secondary | ICD-10-CM

## 2018-01-12 DIAGNOSIS — O0990 Supervision of high risk pregnancy, unspecified, unspecified trimester: Secondary | ICD-10-CM

## 2018-01-12 DIAGNOSIS — O09892 Supervision of other high risk pregnancies, second trimester: Secondary | ICD-10-CM

## 2018-01-12 DIAGNOSIS — O09212 Supervision of pregnancy with history of pre-term labor, second trimester: Secondary | ICD-10-CM | POA: Diagnosis not present

## 2018-01-12 DIAGNOSIS — O0992 Supervision of high risk pregnancy, unspecified, second trimester: Secondary | ICD-10-CM

## 2018-01-12 DIAGNOSIS — J4530 Mild persistent asthma, uncomplicated: Secondary | ICD-10-CM

## 2018-01-12 DIAGNOSIS — O99342 Other mental disorders complicating pregnancy, second trimester: Secondary | ICD-10-CM

## 2018-01-12 DIAGNOSIS — F319 Bipolar disorder, unspecified: Secondary | ICD-10-CM

## 2018-01-12 DIAGNOSIS — F332 Major depressive disorder, recurrent severe without psychotic features: Secondary | ICD-10-CM

## 2018-01-12 DIAGNOSIS — F1220 Cannabis dependence, uncomplicated: Secondary | ICD-10-CM

## 2018-01-12 HISTORY — DX: Supervision of high risk pregnancy, unspecified, unspecified trimester: O09.90

## 2018-01-12 MED ORDER — ACCU-CHEK FASTCLIX LANCETS MISC: 1.0000 [IU] | Freq: Four times a day (QID) | 12 refills | Status: DC

## 2018-01-12 MED ORDER — "INSULIN SYRINGE 27G X 1/2"" 1 ML MISC": 3 refills | Status: AC

## 2018-01-12 MED ORDER — "INSULIN SYRINGE 27G X 1/2"" 1 ML MISC": 3 refills | Status: DC

## 2018-01-12 MED ORDER — ASPIRIN EC 81 MG PO TBEC: 81.0000 mg | DELAYED_RELEASE_TABLET | Freq: Every day | ORAL | 2 refills | Status: DC

## 2018-01-12 NOTE — Patient Instructions (Signed)

## 2018-01-12 NOTE — Progress Notes (Signed)
Pt is here for initial OB visit. Type 2 DM.

## 2018-01-12 NOTE — Progress Notes (Signed)
Subjective:   Grace Montgomery is a 27 y.o. [id number] at [redacted]w[redacted]d by LMP being seen today for her first obstetrical visit.  Her obstetrical history is significant for excessive weight gain, obesity, smoker and T2DM on insulin. Patient does not intend to breast feed. Pregnancy history fully reviewed.  Patient reports no complaints.  Patient states she had a pap at a resource center in May or July of 2019.  Also states she does not remember results and does not think she can call them to obtain medical records to confirm Pap results.  HISTORY: OB History  Gravida Para Term Preterm AB Living  3 2 0 2 0 2  SAB TAB Ectopic Multiple Live Births  0 0 0 0 2    # Outcome Date GA Lbr Len/2nd Weight Sex Delivery Anes PTL Lv  3 Current           2 Preterm 06/04/08 [redacted]w[redacted]d    Vag-Spont  N LIV  1 Preterm 11/15/06 [redacted]w[redacted]d    Vag-Spont   LIV    Last pap smear was done July 2019 per patient and was normal  Past Medical History:  Diagnosis Date  . Anxiety   . Asthma   . Bipolar disorder ([place])   . Cannabis dependence   . Gestational diabetes   . 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 NO PAST SURGERIES     Family History  Problem Relation Age of Onset  . Sickle cell anemia Mother   . Depression Mother   . Diabetes Mother   . Diabetes Other        grandparents and aunts/uncles  . Stroke Other        grandparent   Social History   Tobacco Use  . Smoking status: Current Every Day Smoker    Packs/day: 0.25    Types: Cigarettes  . Smokeless tobacco: Never Used  Substance Use Topics  . Alcohol use: Not Currently    Comment: ocassionally  . Drug use: Not Currently    Comment: History of Marijuana   Allergies  Allergen Reactions  . Monistat [Tioconazole] Swelling  . Chocolate Itching and Other (See Comments)    Ears & throat itch  . Banana Itching and Other (See Comments)    Throat itching   Current  Outpatient Medications on File Prior to Visit  Medication Sig Dispense Refill  . insulin NPH Human (HUMULIN N) 100 UNIT/ML injection Take 17 units SQ qd every morning before breakfast and 12 units SQ qd before dinner 10 mL 11  . insulin regular (HUMULIN R) 100 units/mL injection Take 8 units SQ qd before breakfast and 12 units SQ qd before dinner 10 mL 11  . Insulin Syringe 27G X 1/2" 1 ML MISC 77 Syringes by Does not apply route 2 (two) times daily AND 17 Syringes 2 (two) times daily. Insulin N17 units/R8units q am and N12units/R12units acd SQ. 30 each 3  . traZODone (DESYREL) 50 MG tablet Take 50 mg by mouth at bedtime.    . famotidine (PEPCID) 20 MG tablet Take 1 tablet (20 mg total) by mouth 2 (two) times daily. (Patient not taking: Reported on 01/12/2018) 60 tablet 1   No current facility-administered medications on file prior to visit.     Review of Systems Pertinent items noted in HPI and remainder of comprehensive ROS otherwise negative.  Exam   Vitals:   01/12/18  1326  BP: 117/78  Pulse: 97  Weight: 221 lb 6.4 oz (100.4 kg)   Fetal Heart Rate (bpm): 155  Uterus:     Pelvic Exam: Perineum: no hemorrhoids, normal perineum   Vulva: normal external genitalia, no lesions   Vagina:  normal mucosa, normal discharge   Cervix: no lesions and normal, pap smear done.    Adnexa: normal adnexa and no mass, fullness, tenderness   Bony Pelvis: average  System: General: well-developed, well-nourished female in no acute distress   Breasts:  normal appearance, no masses or tenderness bilaterally   Skin: normal coloration and turgor, no rashes   Neurologic: oriented, normal, negative, normal mood   Extremities: normal strength, tone, and muscle mass, ROM of all joints is normal   HEENT PERRLA, extraocular movement intact and sclera clear, anicteric   Mouth/Teeth mucous membranes moist, pharynx normal without lesions and dental hygiene good   Neck supple and no masses   Cardiovascular:  regular rate and rhythm   Respiratory:  no respiratory distress, normal breath sounds   Abdomen: soft, non-tender; bowel sounds normal; no masses,  no organomegaly   Fetal heart tones achieved with doppler.   Assessment:   Pregnancy: [id number] Patient Active Problem List   Diagnosis Date Noted  . Supervision of high risk pregnancy, antepartum 01/12/2018  . Type 2 diabetes mellitus complicating pregnancy in second trimester, antepartum 01/05/2018  . MDD (major depressive disorder), recurrent severe, without psychosis ([place]) 02/16/2017  . Rash and nonspecific skin eruption 08/22/2012  . Obesity 06/29/2011  . Seasonal allergies 06/29/2011  . Asthma, mild persistent 06/29/2011  . Major depressive disorder 04/26/2011    Class: Acute   Indications for ASA therapy (per uptodate) One of the following: Previous pregnancy with preeclampsia, especially early onset and with an adverse outcome No Multifetal gestation No Chronic hypertension No Type 1 or 2 diabetes mellitus Yes Chronic kidney disease No Autoimmune disease (antiphospholipid syndrome, systemic lupus erythematosus) No  Two or more of the following: Nulliparity No Obesity (body mass index >30 kg/m2) Yes Family history of preeclampsia in mother or sister No Age ?35 years No Sociodemographic characteristics (African American race, low socioeconomic level) Yes Personal risk factors (eg, previous pregnancy with low birth weight or small for gestational age infant, previous adverse pregnancy outcome [eg, stillbirth], interval >10 years between pregnancies) Yes   Plan:  1. Supervision of high risk pregnancy in second trimester - Patient extremely unsettled during exam.  - Start daily ASA therapy, rx to pharmacy of record - PEC labs collected - Comprehensive metabolic panel - Culture, OB Urine - Cytology - PAP Uncertain of successful specimen collection due to patient closing her knees on CNM's hand during Pap - Genetic  Screening - Hemoglobin A1c - Hemoglobinopathy Evaluation - Obstetric Panel, Including HIV - Protein / creatinine ratio, urine - SMN1 COPY NUMBER ANALYSIS (SMA Carrier Screen) - TSH - [place] MFM OB DETAIL +14 WK; Future - Cystic fibrosis gene test  2. History of preterm delivery, currently pregnant in second trimester --Patient states she did not have spontaneous preterm deliveries but was induced at 36 weeks for poor glycemic control --[place] for cervical length not indicated, Makena not indicated  3. Type 2 diabetes mellitus complicating pregnancy in second trimester, antepartum --On insulin --Rx for additional lancets sent to patient pharmacy --Ambulatory referral to Diabetes Education  4. MDD (major depressive disorder), recurrent severe, without psychosis ([place]) -Weekly therapy sessions. Mood stabilizer dosages already adjusted down for pregnancy by mental health Provider  5. Cannabis dependence ([place]) - Urine drugs of abuse scrn w alc, routine ([facility])  6. Mild persistent asthma without complication - Has inhaler does not use  7. Bipolar disease during pregnancy in second trimester ([place])  8. Smoker - Patient states she smokes 5-10 cigarettes per week. - Discussed risk factors and co-morbidities related to smoking,   Initial labs drawn. Continue prenatal vitamins. Genetic Screening discussed, First trimester screen, Quad screen and NIPS: ordered. Ultrasound discussed; fetal anatomic survey: ordered. Problem list reviewed and updated. The nature of [facility] with multiple MDs and other Advanced Practice Providers was explained to patient; also emphasized that residents, students are part of our team. Routine obstetric precautions reviewed. Return in about 4 weeks (around 02/09/2018) for MD only please.  Mallie Snooks, CNM 01/12/18 2:38 PM

## 2018-01-12 NOTE — Progress Notes (Signed)
Insulin syringes reordered per patient request. Mallie Snooks, CNM 01/12/18 3:37 PM

## 2018-01-13 LAB — URINE DRUGS OF ABUSE SCREEN W ALC, ROUTINE (REF LAB)
Amphetamines, Urine: NEGATIVE ng/mL
Barbiturate Quant, Ur: NEGATIVE ng/mL
Benzodiazepine Quant, Ur: NEGATIVE ng/mL
COCAINE (METAB.): NEGATIVE ng/mL
Cannabinoid Quant, Ur: NEGATIVE ng/mL
ETHANOL, URINE: NEGATIVE %
METHADONE SCREEN, URINE: NEGATIVE ng/mL
OPIATE QUANT UR: NEGATIVE ng/mL
PCP QUANT UR: NEGATIVE ng/mL
PROPOXYPHENE: NEGATIVE ng/mL

## 2018-01-14 LAB — CULTURE, OB URINE

## 2018-01-14 LAB — URINE CULTURE, OB REFLEX: Organism ID, Bacteria: NO GROWTH

## 2018-01-16 ENCOUNTER — Other Ambulatory Visit: Payer: Self-pay

## 2018-01-16 LAB — CYTOLOGY - PAP
Adequacy: ABSENT
CANDIDA VAGINITIS: NEGATIVE
CHLAMYDIA, DNA PROBE: NEGATIVE
Diagnosis: NEGATIVE
Neisseria Gonorrhea: NEGATIVE
TRICH (WINDOWPATH): NEGATIVE

## 2018-01-16 MED ORDER — GLUCOSE BLOOD VI STRP: ORAL_STRIP | 12 refills | Status: DC

## 2018-01-16 NOTE — Progress Notes (Signed)
Pt states the incorrect lancets were sent to the pharmacy but she didn't need lancets. She needs test strips not lancets. Strips sent today.

## 2018-01-17 LAB — COMPREHENSIVE METABOLIC PANEL
ALBUMIN: 3.6 g/dL (ref 3.5–5.5)
ALK PHOS: 40 IU/L (ref 39–117)
ALT: 29 IU/L (ref 0–32)
AST: 19 IU/L (ref 0–40)
Albumin/Globulin Ratio: 1.3 (ref 1.2–2.2)
BUN / CREAT RATIO: 14 (ref 9–23)
BUN: 9 mg/dL (ref 6–20)
Bilirubin Total: <0.2 mg/dL (ref 0.0–1.2)
CALCIUM: 9 mg/dL (ref 8.7–10.2)
CO2: 18 mmol/L — AB (ref 20–29)
CREATININE: 0.63 mg/dL (ref 0.57–1.00)
Chloride: 102 mmol/L (ref 96–106)
GFR, EST AFRICAN AMERICAN: 141 mL/min/{1.73_m2} (ref >59)
GFR, EST NON AFRICAN AMERICAN: 122 mL/min/{1.73_m2} (ref >59)
GLOBULIN, TOTAL: 2.7 g/dL (ref 1.5–4.5)
GLUCOSE: 114 mg/dL — AB (ref 65–99)
Potassium: 3.9 mmol/L (ref 3.5–5.2)
SODIUM: 135 mmol/L (ref 134–144)
TOTAL PROTEIN: 6.3 g/dL (ref 6.0–8.5)

## 2018-01-17 LAB — OBSTETRIC PANEL, INCLUDING HIV
ANTIBODY SCREEN: NEGATIVE
BASOS: 1 %
Basophils Absolute: 0.1 10*3/uL (ref 0.0–0.2)
EOS (ABSOLUTE): 0.3 10*3/uL (ref 0.0–0.4)
Eos: 3 %
HIV SCREEN 4TH GENERATION: NONREACTIVE
Hematocrit: 33.3 % — ABNORMAL LOW (ref 34.0–46.6)
Hemoglobin: 11.6 g/dL (ref 11.1–15.9)
Hepatitis B Surface Ag: NEGATIVE
Immature Grans (Abs): 0.1 10*3/uL (ref 0.0–0.1)
Immature Granulocytes: 1 %
Lymphocytes Absolute: 2.9 10*3/uL (ref 0.7–3.1)
Lymphs: 30 %
MCH: 30.9 pg (ref 26.6–33.0)
MCHC: 34.8 g/dL (ref 31.5–35.7)
MCV: 89 fL (ref 79–97)
MONOCYTES: 7 %
Monocytes Absolute: 0.7 10*3/uL (ref 0.1–0.9)
NEUTROS ABS: 5.8 10*3/uL (ref 1.4–7.0)
Neutrophils: 58 %
PLATELETS: 304 10*3/uL (ref 150–450)
RBC: 3.76 x10E6/uL — ABNORMAL LOW (ref 3.77–5.28)
RDW: 16 % — AB (ref 12.3–15.4)
RPR Ser Ql: NONREACTIVE
RUBELLA: 1.89 {index} (ref >0.99)
Rh Factor: NEGATIVE
WBC: 9.7 10*3/uL (ref 3.4–10.8)

## 2018-01-17 LAB — HEMOGLOBINOPATHY EVALUATION
FERRITIN: 37 ng/mL (ref 15–150)
HGB A2 QUANT: 2.5 % (ref 1.8–3.2)
HGB A: 97.5 % (ref 96.4–98.8)
HGB C: 0 %
HGB F QUANT: 0 % (ref 0.0–2.0)
HGB S: 0 %
Hgb Solubility: NEGATIVE
Hgb Variant: 0 %

## 2018-01-17 LAB — PROTEIN / CREATININE RATIO, URINE
CREATININE, UR: 105.1 mg/dL
PROTEIN UR: 11.7 mg/dL
PROTEIN/CREAT RATIO: 111 mg/g{creat} (ref 0–200)

## 2018-01-17 LAB — TSH: TSH: 2.12 u[IU]/mL (ref 0.450–4.500)

## 2018-01-17 LAB — HEMOGLOBIN A1C
Est. average glucose Bld gHb Est-mCnc: 120 mg/dL
HEMOGLOBIN A1C: 5.8 % — AB (ref 4.8–5.6)

## 2018-01-18 ENCOUNTER — Telehealth: Payer: Self-pay

## 2018-01-18 ENCOUNTER — Other Ambulatory Visit: Payer: Self-pay | Admitting: Advanced Practice Midwife

## 2018-01-18 MED ORDER — METRONIDAZOLE 500 MG PO TABS: 500.0000 mg | ORAL_TABLET | Freq: Two times a day (BID) | ORAL | 0 refills | Status: DC

## 2018-01-18 NOTE — Progress Notes (Signed)
Pap positive bacterial vaginosis. Rx to pharmacy. Clinic messaged to notify patient of results and rx. Mallie Snooks, CNM 01/18/18 1:54 PM

## 2018-01-18 NOTE — Telephone Encounter (Signed)
Pt aware of test results and will pick up rx today.

## 2018-01-23 ENCOUNTER — Other Ambulatory Visit: Payer: Self-pay | Admitting: *Deleted

## 2018-01-23 LAB — SMN1 COPY NUMBER ANALYSIS (SMA CARRIER SCREENING)

## 2018-01-23 LAB — CYSTIC FIBROSIS GENE TEST

## 2018-01-23 MED ORDER — VITAFOL-NANO 18-0.6-0.4 MG PO TABS: 1.0000 | ORAL_TABLET | Freq: Every day | ORAL | 11 refills | Status: DC

## 2018-01-23 NOTE — Progress Notes (Signed)
Pt called to office for Rx for PNV. Pt ask for smallest available PNC. Vitafol Nano sent to [facility].

## 2018-01-24 ENCOUNTER — Other Ambulatory Visit: Payer: Self-pay | Admitting: *Deleted

## 2018-01-24 DIAGNOSIS — O0992 Supervision of high risk pregnancy, unspecified, second trimester: Secondary | ICD-10-CM

## 2018-01-24 MED ORDER — PRENATE PIXIE 10-0.6-0.4-200 MG PO CAPS: 1.0000 | ORAL_CAPSULE | Freq: Every day | ORAL | 11 refills | Status: DC

## 2018-01-24 NOTE — Progress Notes (Signed)
Change in PNV Rx. Pharmacy states Vitafol nano not available. Prenate pixie ordered.  LM on VM making pt aware of change.

## 2018-01-25 ENCOUNTER — Encounter: Payer: Self-pay | Admitting: Obstetrics and Gynecology

## 2018-01-25 ENCOUNTER — Encounter (HOSPITAL_COMMUNITY): Payer: Self-pay | Admitting: *Deleted

## 2018-01-25 ENCOUNTER — Emergency Department (HOSPITAL_COMMUNITY)
Admission: EM | Admit: 2018-01-25 | Discharge: 2018-01-25 | Disposition: A | Discharge status: Home / Self Care | Payer: Medicaid Other | Attending: Emergency Medicine | Admitting: Emergency Medicine

## 2018-01-25 DIAGNOSIS — F17210 Nicotine dependence, cigarettes, uncomplicated: Secondary | ICD-10-CM | POA: Insufficient documentation

## 2018-01-25 DIAGNOSIS — Z794 Long term (current) use of insulin: Secondary | ICD-10-CM | POA: Diagnosis not present

## 2018-01-25 DIAGNOSIS — Z79899 Other long term (current) drug therapy: Secondary | ICD-10-CM | POA: Diagnosis not present

## 2018-01-25 DIAGNOSIS — O99332 Smoking (tobacco) complicating pregnancy, second trimester: Secondary | ICD-10-CM | POA: Insufficient documentation

## 2018-01-25 DIAGNOSIS — E119 Type 2 diabetes mellitus without complications: Secondary | ICD-10-CM | POA: Diagnosis not present

## 2018-01-25 DIAGNOSIS — H1031 Unspecified acute conjunctivitis, right eye: Secondary | ICD-10-CM | POA: Diagnosis not present

## 2018-01-25 DIAGNOSIS — O24112 Pre-existing diabetes mellitus, type 2, in pregnancy, second trimester: Secondary | ICD-10-CM | POA: Diagnosis not present

## 2018-01-25 DIAGNOSIS — H5789 Other specified disorders of eye and adnexa: Secondary | ICD-10-CM | POA: Diagnosis present

## 2018-01-25 MED ORDER — TETRACAINE HCL 0.5 % OP SOLN
2.0000 [drp] | Freq: Once | OPHTHALMIC | Status: AC
  Administered 2018-01-25: 2 [drp] via OPHTHALMIC
  Filled 2018-01-25: qty 4

## 2018-01-25 MED ORDER — ERYTHROMYCIN 5 MG/GM OP OINT: TOPICAL_OINTMENT | OPHTHALMIC | 0 refills | Status: DC

## 2018-01-25 MED ORDER — FLUORESCEIN SODIUM 1 MG OP STRP
1.0000 | ORAL_STRIP | Freq: Once | OPHTHALMIC | Status: AC
  Administered 2018-01-25: 1 via OPHTHALMIC
  Filled 2018-01-25: qty 1

## 2018-01-25 NOTE — ED Triage Notes (Signed)
Pt in c/o right eye redness and drainage since yesterday, no distress noted

## 2018-01-25 NOTE — ED Notes (Signed)
Fast track patient see provider note for assessment.

## 2018-01-25 NOTE — ED Notes (Signed)
Signature pad not working verbalized understanding of discharge instructions.

## 2018-01-25 NOTE — ED Provider Notes (Signed)
[facility] Provider Note   CSN: [id number] Arrival date & time: 01/25/18  1021     History   Chief Complaint Chief Complaint  Patient presents with  . Eye Drainage    HPI Grace Montgomery is a 27 y.o. female.  HPI   Patient is a 27 year old female with anxiety, asthma, bipolar disorder, gestational diabetes, who presents the emergency department today complaining of right eye drainage that began this morning.  Patient states she woke up and her eye was crusted shut.  She wiped away the drainage with a warm rag and it returned several minutes later.  States that the drainage and not clear.  No pain to the eye but she does report some itchiness to the eye.  No vision changes, headaches, vomiting, dizziness, lightheadedness, number of her legs.  States that she has been exposed to sick contacts at work with similar eye complaints.  Past Medical History:  Diagnosis Date  . Anxiety   . Asthma   . Bipolar disorder ([place])   . Cannabis dependence   . Gestational diabetes   . 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 Supervision of high risk pregnancy, antepartum 01/12/2018  . Type 2 diabetes mellitus complicating pregnancy in second trimester, antepartum 01/05/2018  . MDD (major depressive disorder), recurrent severe, without psychosis ([place]) 02/16/2017  . Rash and nonspecific skin eruption 08/22/2012  . Obesity 06/29/2011  . Seasonal allergies 06/29/2011  . Asthma, mild persistent 06/29/2011  . Major depressive disorder 04/26/2011    Class: Acute    Past Surgical History:  Procedure Laterality Date  . NO PAST SURGERIES       OB History    Gravida  3   Para  2   Term  0   Preterm  2   AB  0   Living  2     SAB  0   TAB  0   Ectopic  0   Multiple  0   Live Births  2            Home Medications    Prior to Admission medications     Medication Sig Start Date End Date Taking? Authorizing Provider  ACCU-CHEK FASTCLIX LANCETS MISC 1 Units by Percutaneous route 4 (four) times daily. 01/12/18   Darlina Rumpf, CNM  aspirin EC 81 MG tablet Take 1 tablet (81 mg total) by mouth daily. Take after 12 weeks for prevention of preeclampsia later in pregnancy 01/12/18   Darlina Rumpf, CNM  erythromycin ophthalmic ointment Place a 1/2 inch ribbon of ointment into the lower eyelid four times daily for 7 days 01/25/18   Marckus Hanover S, PA-C  famotidine (PEPCID) 20 MG tablet Take 1 tablet (20 mg total) by mouth 2 (two) times daily. Patient not taking: Reported on 01/12/2018 12/27/17 02/25/18  Jorje Guild, NP  glucose blood (AGAMATRIX PRESTO TEST) test strip Use as instructed 01/16/18   Darlina Rumpf, CNM  insulin NPH Human (HUMULIN N) 100 UNIT/ML injection Take 17 units SQ qd every morning before breakfast and 12 units SQ qd before dinner 01/05/18   Poe, Deirdre C, CNM  insulin regular (HUMULIN R) 100 units/mL injection Take 8 units SQ qd before breakfast and 12 units SQ qd before dinner 01/05/18   Poe, Deirdre C, CNM  Insulin Syringe 27G X 1/2" 1 ML MISC 77 Syringes  by Does not apply route 2 (two) times daily AND 17 Syringes 2 (two) times daily. Insulin N17 units/R8units q am and N12units/R12units acd SQ. 01/12/18 02/11/18  Mallie Snooks C, CNM  metroNIDAZOLE (FLAGYL) 500 MG tablet Take 1 tablet (500 mg total) by mouth 2 (two) times daily. 01/18/18   Mallie Snooks C, CNM  Prenat-FeAsp-Meth-FA-DHA w/o A (PRENATE PIXIE) 10-0.6-0.4-200 MG CAPS Take 1 capsule by mouth daily. 01/24/18   Shelly Bombard, MD  Prenatal-Fe Fum-Methf-FA w/o A (VITAFOL-NANO) 18-0.6-0.4 MG TABS Take 1 tablet by mouth daily. 01/23/18   Constant, Peggy, MD  traZODone (DESYREL) 50 MG tablet Take 50 mg by mouth at bedtime.    [provider]    Family History Family History  Problem Relation Age of Onset  . Sickle cell anemia  Mother   . Depression Mother   . Diabetes Mother   . Diabetes Other        grandparents and aunts/uncles  . Stroke Other        grandparent    Social History Social History   Tobacco Use  . Smoking status: Current Every Day Smoker    Packs/day: 0.25    Types: Cigarettes  . Smokeless tobacco: Never Used  Substance Use Topics  . Alcohol use: Not Currently    Comment: ocassionally  . Drug use: Not Currently    Comment: History of Marijuana     Allergies   Monistat [tioconazole]; Chocolate; and Banana   Review of Systems Review of Systems  Constitutional: Negative for chills and fever.  HENT: Negative for congestion, rhinorrhea and sore throat.   Eyes: Positive for discharge and itching. Negative for photophobia, pain, redness and visual disturbance.  Respiratory: Negative for shortness of breath.   Cardiovascular: Negative for chest pain.  Gastrointestinal: Negative for nausea and vomiting.  Neurological: Negative for dizziness, weakness, light-headedness, numbness and headaches.    Physical Exam Updated Vital Signs BP 107/72 (BP Location: Left Arm)   Pulse 100   Temp 98.4 F (36.9 C) (Oral)   Resp 16   LMP 10/02/2017 (Exact Date)   SpO2 100%   Physical Exam  Constitutional: She appears well-developed and well-nourished. No distress.  HENT:  Head: Normocephalic and atraumatic.  Eyes: Pupils are equal, round, and reactive to light. Conjunctivae and EOM are normal.  No drainage noted on exam.  Conjunctive is within normal limits.  No pain with extraocular movements.  Fluorescein stain completed with no uptake noted.  Visual acuity; bilat: 20/25, R: 20/20, L:20/25  Neck: Neck supple.  Cardiovascular: Normal rate and regular rhythm.  Hr 90 on monitor  Pulmonary/Chest: Effort normal.  Abdominal:  Gravid abdomen  Musculoskeletal: Normal range of motion.  Neurological: She is alert.  Skin: Skin is warm and dry.  Psychiatric: She has a normal mood and affect.   Nursing note and vitals reviewed.   ED Treatments / Results  Labs (all labs ordered are listed, but only abnormal results are displayed) Labs Reviewed - No data to display  EKG None  Radiology No results found.  Procedures Procedures (including critical care time)  Medications Ordered in ED Medications  fluorescein ophthalmic strip 1 strip (1 strip Both Eyes Given 01/25/18 1044)  tetracaine (PONTOCAINE) 0.5 % ophthalmic solution 2 drop (2 drops Both Eyes Given 01/25/18 1044)     Initial Impression / Assessment and Plan / ED Course  I have reviewed the triage vital signs and the nursing notes.  Pertinent labs & imaging results that were available  during my care of the patient were reviewed by me and considered in my medical decision making (see chart for details).      Final Clinical Impressions(s) / ED Diagnoses   Final diagnoses:  Acute conjunctivitis of right eye, unspecified acute conjunctivitis type   Pt with right eye drainage that began this morning.  No eye pain, vision changes, headaches or other concerning symptoms.  Reports sick contacts with similar symptoms. Initial vitals with tachycardia to 104, on my eval pt not tachycardic and hr is 90 on monitor. No cp or sob. Other vitals stable.  No drainage noted on exam.  Conjunctive is within normal limits.  No pain with extraocular movements.  Fluorescein stain completed with no uptake noted.  Visual acuity; bilat: 20/25, R: 20/20, L:20/25  Suspect viral versus bacterial conjunctivitis.  Clinically patient appears to have viral conjunctivitis however she reports purulent drainage so will cover for bacterial conjunctivitis with erythromycin ointment.  Have advised her to follow-up with with her PCP and return to the ER for new or worsening symptoms.  She voices an understanding of plan reasons to return to ED precautions answered  ED Discharge Orders         Ordered    erythromycin ophthalmic ointment     01/25/18  1150           Rodney Booze, PA-C 01/25/18 1151    Tegeler, Gwenyth Allegra, MD 01/25/18 [phone]

## 2018-01-25 NOTE — ED Notes (Signed)
Patient in bathroom

## 2018-01-25 NOTE — Discharge Instructions (Addendum)
Please use the antibiotic ointment 4 times daily for the next 7 days.  Please follow-up with your regular doctor in the next 2 to 3 days for reevaluation.  If you have any new or worsening symptoms including worsening of your symptoms, visual changes, headaches, nausea or vomiting and return to the emergency department immediately.

## 2018-01-26 ENCOUNTER — Encounter: Payer: Medicaid Other | Attending: Obstetrics and Gynecology | Admitting: Registered"

## 2018-01-26 ENCOUNTER — Encounter: Payer: Self-pay | Admitting: Registered"

## 2018-01-26 ENCOUNTER — Other Ambulatory Visit: Payer: Self-pay

## 2018-01-26 ENCOUNTER — Inpatient Hospital Stay (HOSPITAL_COMMUNITY)
Admission: AD | Admit: 2018-01-26 | Discharge: 2018-01-26 | Disposition: A | Discharge status: Home / Self Care | Payer: Medicaid Other | Source: Ambulatory Visit | Attending: Obstetrics and Gynecology | Admitting: Obstetrics and Gynecology

## 2018-01-26 ENCOUNTER — Encounter (HOSPITAL_COMMUNITY): Payer: Self-pay | Admitting: Emergency Medicine

## 2018-01-26 DIAGNOSIS — Z3A16 16 weeks gestation of pregnancy: Secondary | ICD-10-CM | POA: Diagnosis not present

## 2018-01-26 DIAGNOSIS — O26892 Other specified pregnancy related conditions, second trimester: Secondary | ICD-10-CM

## 2018-01-26 DIAGNOSIS — R0789 Other chest pain: Secondary | ICD-10-CM | POA: Diagnosis not present

## 2018-01-26 DIAGNOSIS — O24112 Pre-existing diabetes mellitus, type 2, in pregnancy, second trimester: Secondary | ICD-10-CM

## 2018-01-26 DIAGNOSIS — Z7982 Long term (current) use of aspirin: Secondary | ICD-10-CM | POA: Insufficient documentation

## 2018-01-26 DIAGNOSIS — R0602 Shortness of breath: Secondary | ICD-10-CM | POA: Insufficient documentation

## 2018-01-26 DIAGNOSIS — Z3A00 Weeks of gestation of pregnancy not specified: Secondary | ICD-10-CM | POA: Insufficient documentation

## 2018-01-26 DIAGNOSIS — Z713 Dietary counseling and surveillance: Secondary | ICD-10-CM | POA: Insufficient documentation

## 2018-01-26 DIAGNOSIS — F17210 Nicotine dependence, cigarettes, uncomplicated: Secondary | ICD-10-CM | POA: Insufficient documentation

## 2018-01-26 LAB — CBC
HEMATOCRIT: 35.7 % — AB (ref 36.0–46.0)
HEMOGLOBIN: 11.9 g/dL — AB (ref 12.0–15.0)
MCH: 31.3 pg (ref 26.0–34.0)
MCHC: 33.3 g/dL (ref 30.0–36.0)
MCV: 93.9 fL (ref 80.0–100.0)
NRBC: 0 % (ref 0.0–0.2)
Platelets: 284 10*3/uL (ref 150–400)
RBC: 3.8 MIL/uL — AB (ref 3.87–5.11)
RDW: 16.3 % — ABNORMAL HIGH (ref 11.5–15.5)
WBC: 10.5 10*3/uL (ref 4.0–10.5)

## 2018-01-26 LAB — COMPREHENSIVE METABOLIC PANEL
ALBUMIN: 3 g/dL — AB (ref 3.5–5.0)
ALK PHOS: 35 U/L — AB (ref 38–126)
ALT: 24 U/L (ref 0–44)
AST: 24 U/L (ref 15–41)
Anion gap: 8 (ref 5–15)
BILIRUBIN TOTAL: 0.4 mg/dL (ref 0.3–1.2)
BUN: 13 mg/dL (ref 6–20)
CALCIUM: 8.8 mg/dL — AB (ref 8.9–10.3)
CO2: 23 mmol/L (ref 22–32)
Chloride: 103 mmol/L (ref 98–111)
Creatinine, Ser: 0.81 mg/dL (ref 0.44–1.00)
GFR calc Af Amer: >60 mL/min (ref >60)
GFR calc non Af Amer: >60 mL/min (ref >60)
GLUCOSE: 116 mg/dL — AB (ref 70–99)
Potassium: 3.9 mmol/L (ref 3.5–5.1)
SODIUM: 134 mmol/L — AB (ref 135–145)
Total Protein: 6.6 g/dL (ref 6.5–8.1)

## 2018-01-26 LAB — GLUCOSE, CAPILLARY: GLUCOSE-CAPILLARY: 118 mg/dL — AB (ref 70–99)

## 2018-01-26 MED ORDER — ALUM & MAG HYDROXIDE-SIMETH 200-200-20 MG/5ML PO SUSP: 30.0000 mL | Freq: Once | ORAL | Status: DC

## 2018-01-26 MED ORDER — ALUM & MAG HYDROXIDE-SIMETH 200-200-20 MG/5ML PO SUSP
30.0000 mL | Freq: Once | ORAL | Status: AC
  Administered 2018-01-26: 30 mL via ORAL
  Filled 2018-01-26: qty 30

## 2018-01-26 MED ORDER — LIDOCAINE VISCOUS HCL 2 % MT SOLN
15.0000 mL | Freq: Once | OROMUCOSAL | Status: AC
  Administered 2018-01-26: 15 mL via ORAL
  Filled 2018-01-26: qty 15

## 2018-01-26 MED ORDER — ALBUTEROL SULFATE HFA 108 (90 BASE) MCG/ACT IN AERS
2.0000 | INHALATION_SPRAY | RESPIRATORY_TRACT | Status: DC | PRN
  Filled 2018-01-26: qty 6.7

## 2018-01-26 MED ORDER — ALBUTEROL SULFATE (2.5 MG/3ML) 0.083% IN NEBU: 3.0000 mL | INHALATION_SOLUTION | RESPIRATORY_TRACT | Status: DC

## 2018-01-26 MED ORDER — ALBUTEROL SULFATE (2.5 MG/3ML) 0.083% IN NEBU
2.5000 mg | INHALATION_SOLUTION | Freq: Once | RESPIRATORY_TRACT | Status: AC
  Administered 2018-01-26: 2.5 mg via RESPIRATORY_TRACT
  Filled 2018-01-26: qty 3

## 2018-01-26 NOTE — MAU Note (Addendum)
Pt reports she has has SOB since earlier this afternoon. C/O chest tightness as well. Stated she does have asthma but they will not give her an inhaler. Stated the SOB and chest tightness got worse when she tried to eat.

## 2018-01-26 NOTE — Patient Instructions (Addendum)
Consider eating protein with carbs. When eating cereal consider also eating nuts. Whenever fruit also eat protein. Continue to treat your low blood sugar with 1/2 cup OJ Continue to drink plenty of water Consider downloading the app for your glucose meter that will show more information about your numbers at once, or write down your numbers. Test your blood sugar about 2 hours after eating, the target range 120  In the morning before you eat; 60-95 Consider enjoying your pasta meals but consider keeping it to about cup and if still hungry eat more of the meat and vegetables.

## 2018-01-26 NOTE — MAU Provider Note (Addendum)
Patient Grace Montgomery is a 27 y.o. [phone] At [redacted]w[redacted]d here with complaints chest tightening, SOB that started at 1 pm today.  She has a history of asthma with occasional inhaler use.  History     CSN: [id number]  Arrival date and time: 01/26/18 1851   None     Chief Complaint  Patient presents with  . Shortness of Breath   Shortness of Breath  This is a new problem. The current episode started today (1 pm today. ). The problem has been gradually worsening. Associated symptoms include chest pain, headaches and vomiting. Pertinent negatives include no fever. The symptoms are aggravated by exercise (going up and down stairs).  She says that she was possibly eating too fast and then she couldn't breathe  and she threw up while eating. She does not think it was heart burn; she thinks it was due to not being able to breath while she was eating.   She doesn't have an inhaler at home; she says she asked for one at the beginning of her pregnancy but was told by [facility] that it wasn't covered under Medicaid. She has felt like she has needed her inhaler in the past few months but has not gotten one.   OB History    Gravida  4   Para  3   Term  1   Preterm  2   AB  0   Living  2     SAB  0   TAB  0   Ectopic  0   Multiple  0   Live Births  2           Past Medical History:  Diagnosis Date  . Anxiety   . Asthma   . Bipolar disorder ([place])   . Cannabis dependence   . Gestational diabetes   . 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 NO PAST SURGERIES      Family History  Problem Relation Age of Onset  . Sickle cell anemia Mother   . Depression Mother   . Diabetes Mother   . Diabetes Other        grandparents and aunts/uncles  . Stroke Other        grandparent    Social History   Tobacco Use  . Smoking status: Current Every Day Smoker    Packs/day: 0.25    Types: Cigarettes  .  Smokeless tobacco: Never Used  Substance Use Topics  . Alcohol use: Not Currently    Comment: ocassionally  . Drug use: Not Currently    Comment: History of Marijuana    Allergies:  Allergies  Allergen Reactions  . Monistat [Tioconazole] Swelling  . Chocolate Itching and Other (See Comments)    Ears & throat itch  . Banana Itching and Other (See Comments)    Throat itching    Medications Prior to Admission  Medication Sig Dispense Refill Last Dose  . ACCU-CHEK FASTCLIX LANCETS MISC 1 Units by Percutaneous route 4 (four) times daily. 100 each 12   . aspirin EC 81 MG tablet Take 1 tablet (81 mg total) by mouth daily. Take after 12 weeks for prevention of preeclampsia later in pregnancy 300 tablet 2   . erythromycin ophthalmic ointment Place a 1/2 inch ribbon of ointment into the lower eyelid four times daily for 7 days 1 g 0   . famotidine (PEPCID) 20 MG tablet  Take 1 tablet (20 mg total) by mouth 2 (two) times daily. (Patient not taking: Reported on 01/12/2018) 60 tablet 1 Not Taking  . glucose blood (AGAMATRIX PRESTO TEST) test strip Use as instructed 100 each 12   . insulin NPH Human (HUMULIN N) 100 UNIT/ML injection Take 17 units SQ qd every morning before breakfast and 12 units SQ qd before dinner 10 mL 11 Taking  . insulin regular (HUMULIN R) 100 units/mL injection Take 8 units SQ qd before breakfast and 12 units SQ qd before dinner 10 mL 11 Taking  . Insulin Syringe 27G X 1/2" 1 ML MISC 77 Syringes by Does not apply route 2 (two) times daily AND 17 Syringes 2 (two) times daily. Insulin N17 units/R8units q am and N12units/R12units acd SQ. 30 each 3   . metroNIDAZOLE (FLAGYL) 500 MG tablet Take 1 tablet (500 mg total) by mouth 2 (two) times daily. 14 tablet 0   . Prenat-FeAsp-Meth-FA-DHA w/o A (PRENATE PIXIE) 10-0.6-0.4-200 MG CAPS Take 1 capsule by mouth daily. 30 capsule 11   . Prenatal-Fe Fum-Methf-FA w/o A (VITAFOL-NANO) 18-0.6-0.4 MG TABS Take 1 tablet by mouth daily. 30 tablet  11   . traZODone (DESYREL) 50 MG tablet Take 50 mg by mouth at bedtime.   Taking    Review of Systems  Constitutional: Negative.  Negative for fever.  HENT: Negative.   Respiratory: Positive for shortness of breath.   Cardiovascular: Positive for chest pain.  Gastrointestinal: Positive for vomiting.  Neurological: Positive for headaches.   Physical Exam   Blood pressure 117/75, pulse 99, temperature 98.8 F (37.1 C), resp. rate 20, height 5' 3.5" (1.613 m), weight 103 kg, last menstrual period 10/02/2017, SpO2 100 %.  Physical Exam  Constitutional: She appears well-developed.  HENT:  Head: Normocephalic.  Eyes: Pupils are equal, round, and reactive to light.  Neck: Normal range of motion.  Respiratory: Effort normal.  Patient's breathing is labored; occasional expirartory wheezes in right lower lobr. RR for one full minute were 17. No retractions.   GI: Soft.  Neurological: She is alert.  Skin: Skin is warm and dry.    MAU Course  Procedures  MDM -CBC, CMP-normal -Heartburn cocktail: patient feels relief.  -EKG is normal -Nebulizer treatment by respiratory with complete relief.  Patient is animated and talking in bed.     Patient handed off to CNM at 2121. Patient awaiting inhaler for RN to teach how to use.   Assessment and Plan    Mervyn Skeeters [facility] 01/26/2018, 7:44 PM   Received inhaler for home use RN taught her how to use it Encouraged to return here or to other Urgent Care/ED if she develops worsening of symptoms, increase in pain, fever, or other concerning symptoms.    Seabron Spates, CNM

## 2018-01-26 NOTE — Progress Notes (Signed)
Diabetes Self-Management Education  Visit Type: First/Initial  Appt. Start Time: 1500 Appt. End Time: 5397  01/26/2018  [name], identified by name and date of birth, is a 27 y.o. female with a diagnosis of Diabetes: Gestational Diabetes.   ASSESSMENT Patient was out of breath but seemed better after a few minutes sitting in the office. During appointment Pt was receiving calls and texts from the baby's father who is checking to make sure she is okay. Patient states this is her 3rd time with diabetes in pregnancy and has been to lots of classes as well as 1x week someone comes into their home to teach nutrition.  Pt states she lives in a home for pregnant women and does not have control over what food is prepared or available. Patient states she has never been a soda drinker but does like juice and lemonade.   Pt states she has frequent low blood sugar during the middle of the night and will wake up shaking when it gets in the 70s. Pt reports it regularly goes down to 58 mg/dL and was instructed by her doctor to just eat or drink something to bring it back up. Patient states it is hard to go back to sleep. Patient states in October her BG was going over 200 but now is staying below   Patient states she doesn't eat a lot of vegetables because the way they are cooked at the home they do not have flavor.   Patient had some hot fries in her purse and states it has lasted her several weeks and uses them to snack on when feeling sugar low.  Diabetes Self-Management Education - 01/26/18 1505      Visit Information   Visit Type  First/Initial      Initial Visit   Diabetes Type  Gestational Diabetes    Are you taking your medications as prescribed?  Yes    Date Diagnosed  Jan 2019      Health Coping   How would you rate your overall health?  Good      Psychosocial Assessment   Patient Belief/Attitude about Diabetes  Afraid    How often do you need to have someone help you when  you read instructions, pamphlets, or other written materials from your doctor or pharmacy?  1 - Never    What is the last grade level you completed in school?  11      Complications   How often do you check your blood sugar?  1-2 times/day      Dietary Intake   Breakfast  cereal OR eggs, sausage     Snack (morning)  none OR leftovers if low blood sugar    Lunch  leftover    Dinner  tacos, chicken, potatoes or mac & cheese    Beverage(s)  water, OJ for low blood sugar,       Exercise   Exercise Type  Light (walking / raking leaves)    How many days per week to you exercise?  7    How many minutes per day do you exercise?  30    Total minutes per week of exercise  210      Patient Education   Previous Diabetes Education  Yes (please comment)    Nutrition management   Role of diet in the treatment of diabetes and the relationship between the three main macronutrients and blood glucose level    Acute complications  Taught treatment of hypoglycemia -  the 15 rule.    Preconception care  Reviewed with patient blood glucose goals with pregnancy;Pregnancy and GDM  Role of pre-pregnancy blood glucose control on the development of the fetus      Individualized Goals (developed by patient)   Nutrition  General guidelines for healthy choices and portions discussed    Monitoring   test my blood glucose as discussed      Outcomes   Expected Outcomes  Demonstrated interest in learning. Expect positive outcomes    Future DMSE  PRN    Program Status  Completed      Individualized Plan for Diabetes Self-Management Training:   Learning Objective:  Patient will have a greater understanding of diabetes self-management. Patient education plan is to attend individual and/or group sessions per assessed needs and concerns.  Patient Instructions  Consider eating protein with carbs. When eating cereal consider also eating nuts. Whenever fruit also eat protein. Continue to treat your low blood sugar  with 1/2 cup OJ Continue to drink plenty of water Consider downloading the app for your glucose meter that will show more information about your numbers at once, or write down your numbers. Test your blood sugar about 2 hours after eating, the target range 120  In the morning before you eat; 60-95 Consider enjoying your pasta meals but consider keeping it to about cup and if still hungry eat more of the meat and vegetables.  Expected Outcomes:  Demonstrated interest in learning. Expect positive outcomes  Education material provided: [employer], Gestational Diabetes packet  If problems or questions, patient to contact team via:  Phone and MyChart  Future DSME appointment: PRN

## 2018-01-31 ENCOUNTER — Other Ambulatory Visit: Payer: Self-pay

## 2018-01-31 MED ORDER — ACETAMINOPHEN 500 MG PO TABS: 500.0000 mg | ORAL_TABLET | Freq: Four times a day (QID) | ORAL | 0 refills | Status: DC | PRN

## 2018-01-31 MED ORDER — GUAIFENESIN 100 MG/5ML PO SOLN: 5.0000 mL | ORAL | 0 refills | Status: DC | PRN

## 2018-01-31 NOTE — Progress Notes (Signed)
Pt called and states she lives at a pregnancy care house and she cannot take OTC meds for her sore throat and cough unless we send them and approve them. I spoke with manager at residence and she confirmed this with me and also asked me to fax over a letter stating the pt was okay to take these medications. Rx's sent and letter sent.

## 2018-02-02 ENCOUNTER — Encounter (HOSPITAL_COMMUNITY): Payer: Self-pay | Admitting: *Deleted

## 2018-02-02 ENCOUNTER — Inpatient Hospital Stay (HOSPITAL_COMMUNITY)
Admission: AD | Admit: 2018-02-02 | Discharge: 2018-02-02 | Disposition: A | Discharge status: Hospice | Payer: Medicaid Other | Source: Ambulatory Visit | Attending: Obstetrics and Gynecology | Admitting: Obstetrics and Gynecology

## 2018-02-02 ENCOUNTER — Inpatient Hospital Stay (HOSPITAL_BASED_OUTPATIENT_CLINIC_OR_DEPARTMENT_OTHER): Payer: Medicaid Other

## 2018-02-02 ENCOUNTER — Other Ambulatory Visit: Payer: Self-pay

## 2018-02-02 DIAGNOSIS — Z87891 Personal history of nicotine dependence: Secondary | ICD-10-CM | POA: Diagnosis not present

## 2018-02-02 DIAGNOSIS — Z79899 Other long term (current) drug therapy: Secondary | ICD-10-CM | POA: Insufficient documentation

## 2018-02-02 DIAGNOSIS — O26892 Other specified pregnancy related conditions, second trimester: Secondary | ICD-10-CM | POA: Insufficient documentation

## 2018-02-02 DIAGNOSIS — Z794 Long term (current) use of insulin: Secondary | ICD-10-CM | POA: Diagnosis not present

## 2018-02-02 DIAGNOSIS — Z3A17 17 weeks gestation of pregnancy: Secondary | ICD-10-CM

## 2018-02-02 DIAGNOSIS — Z7982 Long term (current) use of aspirin: Secondary | ICD-10-CM | POA: Diagnosis not present

## 2018-02-02 DIAGNOSIS — R109 Unspecified abdominal pain: Secondary | ICD-10-CM

## 2018-02-02 DIAGNOSIS — N949 Unspecified condition associated with female genital organs and menstrual cycle: Secondary | ICD-10-CM | POA: Diagnosis present

## 2018-02-02 HISTORY — DX: Type 2 diabetes mellitus without complications: E11.9

## 2018-02-02 LAB — URINALYSIS, ROUTINE W REFLEX MICROSCOPIC
Bilirubin Urine: NEGATIVE
GLUCOSE, UA: NEGATIVE mg/dL
Hgb urine dipstick: NEGATIVE
KETONES UR: NEGATIVE mg/dL
LEUKOCYTES UA: NEGATIVE
NITRITE: NEGATIVE
Protein, ur: NEGATIVE mg/dL
Specific Gravity, Urine: 1.006 (ref 1.005–1.030)
pH: 7 (ref 5.0–8.0)

## 2018-02-02 LAB — COMPREHENSIVE METABOLIC PANEL
ALBUMIN: 3.1 g/dL — AB (ref 3.5–5.0)
ALK PHOS: 45 U/L (ref 38–126)
ALT: 21 U/L (ref 0–44)
ANION GAP: 9 (ref 5–15)
AST: 19 U/L (ref 15–41)
BUN: 12 mg/dL (ref 6–20)
CALCIUM: 9.1 mg/dL (ref 8.9–10.3)
CO2: 23 mmol/L (ref 22–32)
CREATININE: 0.66 mg/dL (ref 0.44–1.00)
Chloride: 103 mmol/L (ref 98–111)
GFR calc Af Amer: >60 mL/min (ref >60)
GFR calc non Af Amer: >60 mL/min (ref >60)
GLUCOSE: 89 mg/dL (ref 70–99)
Potassium: 3.7 mmol/L (ref 3.5–5.1)
Sodium: 135 mmol/L (ref 135–145)
Total Bilirubin: 0.5 mg/dL (ref 0.3–1.2)
Total Protein: 7.3 g/dL (ref 6.5–8.1)

## 2018-02-02 LAB — CBC WITH DIFFERENTIAL/PLATELET
BASOS ABS: 0 10*3/uL (ref 0.0–0.1)
BASOS PCT: 0 %
EOS ABS: 0.2 10*3/uL (ref 0.0–0.5)
Eosinophils Relative: 2 %
HEMATOCRIT: 37.9 % (ref 36.0–46.0)
HEMOGLOBIN: 12.7 g/dL (ref 12.0–15.0)
Lymphocytes Relative: 26 %
Lymphs Abs: 2 10*3/uL (ref 0.7–4.0)
MCH: 31.4 pg (ref 26.0–34.0)
MCHC: 33.5 g/dL (ref 30.0–36.0)
MCV: 93.8 fL (ref 80.0–100.0)
MONOS PCT: 7 %
Monocytes Absolute: 0.5 10*3/uL (ref 0.1–1.0)
NEUTROS ABS: 5.1 10*3/uL (ref 1.7–7.7)
NEUTROS PCT: 65 %
NRBC: 0 % (ref 0.0–0.2)
Platelets: 277 10*3/uL (ref 150–400)
RBC: 4.04 MIL/uL (ref 3.87–5.11)
RDW: 16 % — ABNORMAL HIGH (ref 11.5–15.5)
WBC: 7.8 10*3/uL (ref 4.0–10.5)

## 2018-02-02 MED ORDER — CYCLOBENZAPRINE HCL 5 MG PO TABS
5.0000 mg | ORAL_TABLET | Freq: Once | ORAL | Status: AC
  Administered 2018-02-02: 5 mg via ORAL
  Filled 2018-02-02: qty 1

## 2018-02-02 NOTE — MAU Note (Signed)
Pt presents with c/o left lower abdominal pain x2 days.  Reports heat & Tylenol haven't provided any relief.  Denies VB.

## 2018-02-02 NOTE — MAU Provider Note (Addendum)
Chief Complaint: Abdominal Pain   First Provider Initiated Contact with Patient 02/02/18 0738     SUBJECTIVE HPI: Grace Montgomery is a 27 y.o. [id number] at [redacted]w[redacted]d who presents via EMS to [facility] Ad[facility] reporting abdominal pain. Pain started 2 days ago. Pain is constant but is worse with coughing. Called nurse line & was told she has round ligament pain. Denies fever/chills, n/v/d, constipation, dysuria, vaginal bleeding, or vaginal discharge. Has taken tylenol & used heat without relief.   Location: LLQ abdomen Quality: sharp, twisting Severity: 10/10 on pain scale Duration: 2 days Timing: constant Modifying factors: worse with coughing. Not relieved with tylenol or heat Associated signs and symptoms: none  Past Medical History:  Diagnosis Date  . Anxiety   . Asthma   . Bipolar disorder ([place])   . Cannabis dependence   . Diabetes mellitus without complication ([place])   . 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 NO PAST SURGERIES     Social History   Socioeconomic History  . Marital status: Single    Spouse name: Not on file  . Number of children: 2  . Years of education: Not on file  . Highest education level: Not on file  Occupational History  . Occupation: [employer]  Social Needs  . Financial resource strain: Not on file  . Food insecurity:    Worry: Not on file    Inability: Not on file  . Transportation needs:    Medical: Not on file    Non-medical: Not on file  Tobacco Use  . Smoking status: Former Smoker    Packs/day: 0.25    Types: Cigarettes  . Smokeless tobacco: Never  Used  . Tobacco comment: quit November 2019  Substance and Sexual Activity  . Alcohol use: Not Currently    Comment: ocassionally  . Drug use: Not Currently    Comment: History of Marijuana  . Sexual activity: Not Currently    Birth control/protection: None  Lifestyle  . Physical activity:    Days per week: Not on file    Minutes per session: Not on file  . Stress: Not on file  Relationships  . Social connections:    Talks on phone: Not on file    Gets together: Not on file    Attends religious service: Not on file    Active member of club or organization: Not on file    Attends meetings of clubs or organizations: Not on file    Relationship status: Not on file  . Intimate partner violence:    Fear of current or ex partner: Not on file    Emotionally abused: Not on file    Physically abused: Not on file    Forced sexual activity: Not on file  Other Topics Concern  . Not on file  Social History Narrative   Student for psychotherapy   2  Children - Boy(4yo) & Girl (3yo)         Family History  Problem Relation Age of Onset  . Sickle cell anemia Mother   . Depression Mother   . Diabetes Mother   . Diabetes Other        grandparents and aunts/uncles  . Stroke Other        grandparent   No current facility-administered medications on file prior to encounter.    Current Outpatient Medications on File Prior to Encounter  Medication Sig Dispense Refill  . ACCU-CHEK FASTCLIX LANCETS MISC 1 Units by Percutaneous route 4 (four) times daily. 100 each 12  . aspirin EC 81 MG tablet Take 1 tablet (81 mg total) by mouth daily. Take after 12 weeks for prevention of preeclampsia later in pregnancy 300 tablet 2  . erythromycin ophthalmic ointment Place a 1/2 inch ribbon of ointment into the lower eyelid four times daily for 7 days 1 g 0  . famotidine (PEPCID) 20 MG tablet Take 1 tablet (20 mg total) by mouth 2 (two) times daily. (Patient not taking: Reported on 01/12/2018) 60 tablet 1   . glucose blood (AGAMATRIX PRESTO TEST) test strip Use as instructed 100 each 12  . insulin NPH Human (HUMULIN N) 100 UNIT/ML injection Take 17 units SQ qd every morning before breakfast and 12 units SQ qd before dinner 10 mL 11  . insulin regular (HUMULIN R) 100 units/mL injection Take 8 units SQ qd before breakfast and 12 units SQ qd before dinner 10 mL 11  . Insulin Syringe 27G X 1/2" 1 ML MISC 77 Syringes by Does not apply route 2 (two) times daily AND 17 Syringes 2 (two) times daily. Insulin N17 units/R8units q am and N12units/R12units acd SQ. 30 each 3  . metroNIDAZOLE (FLAGYL) 500 MG tablet Take 1 tablet (500 mg total) by mouth 2 (two) times daily. 14 tablet 0  . Prenat-FeAsp-Meth-FA-DHA w/o A (PRENATE PIXIE) 10-0.6-0.4-200 MG CAPS Take 1 capsule by mouth daily. 30 capsule 11  . Prenatal-Fe Fum-Methf-FA w/o A (VITAFOL-NANO) 18-0.6-0.4 MG TABS Take 1 tablet by mouth daily. 30 tablet 11  . traZODone (DESYREL) 50 MG tablet Take 50 mg by mouth at bedtime.     Allergies  Allergen Reactions  . Monistat [Tioconazole] Swelling  . Chocolate Itching and Other (See Comments)    Ears & throat itch  . Banana Itching and Other (See Comments)    Throat itching    I have reviewed patient's Past Medical Hx, Surgical Hx, Family Hx, Social Hx, medications and allergies.   Review of Systems  Constitutional: Negative.   Gastrointestinal: Positive for abdominal pain. Negative for constipation, diarrhea, nausea and vomiting.  Genitourinary: Negative.     OBJECTIVE Patient Vitals for the past 24 hrs:  BP Temp Temp src Pulse Resp SpO2  02/02/18 0719 117/76 98.7 F (37.1 C) Oral 88 18 100 %   Constitutional: Well-developed, well-nourished female in no acute distress.  Cardiovascular: normal rate & rhythm, no murmur Respiratory: normal rate and effort. Lung sounds clear throughout GI: TTP in LLQ & periumbilical. Abd soft, Pos BS x 4. No guarding or rebound tenderness.  No CVAT MS: Extremities  nontender, no edema, normal ROM Neurologic: Alert and oriented x 4.  GU: Cervix fingertip, ?shortening, firm   LAB RESULTS Results for orders placed or performed during the hospital encounter of 02/02/18 (from the past 24 hour(s))  Urinalysis, Routine w reflex microscopic     Status: Abnormal   Collection Time:  02/02/18  7:14 AM  Result Value Ref Range   Color, Urine STRAW (A) YELLOW   APPearance CLEAR CLEAR   Specific Gravity, Urine 1.006 1.005 - 1.030   pH 7.0 5.0 - 8.0   Glucose, UA NEGATIVE NEGATIVE mg/dL   Hgb urine dipstick NEGATIVE NEGATIVE   Bilirubin Urine NEGATIVE NEGATIVE   Ketones, ur NEGATIVE NEGATIVE mg/dL   Protein, ur NEGATIVE NEGATIVE mg/dL   Nitrite NEGATIVE NEGATIVE   Leukocytes, UA NEGATIVE NEGATIVE    IMAGING No results found.  MAU COURSE Orders Placed This Encounter  Procedures  . [place] MFM OB Transvaginal  . Urinalysis, Routine w reflex microscopic  . CBC with Differential/Platelet  . Comprehensive metabolic panel   Meds ordered this encounter  Medications  . cyclobenzaprine (FLEXERIL) tablet 5 mg    MDM FHT present via doppler VSS Pt visibly uncomfortable and rocking in the bed. SVE difficult d/t patient discomfort; ext fingertip with ? Thinning, cervix firm.  Labs & ultrasound pending  Care turned over to [facility] Jorje Guild, NP 02/02/2018  7:39 AM   ASSESSMENT/PLAN Round ligament pain  - Information provided on RLP - Comfort measures discussed to alleviate pain of RLP - Discharge patient  Laury Deep, [facility]  02/02/2018 9:34 AM

## 2018-02-09 ENCOUNTER — Ambulatory Visit (INDEPENDENT_AMBULATORY_CARE_PROVIDER_SITE_OTHER): Payer: Medicaid Other | Admitting: Obstetrics and Gynecology

## 2018-02-09 ENCOUNTER — Other Ambulatory Visit: Payer: Self-pay

## 2018-02-09 ENCOUNTER — Encounter: Payer: Self-pay | Admitting: Obstetrics and Gynecology

## 2018-02-09 VITALS — BP 115/73 | HR 83 | Wt 229.0 lb

## 2018-02-09 DIAGNOSIS — O0990 Supervision of high risk pregnancy, unspecified, unspecified trimester: Secondary | ICD-10-CM

## 2018-02-09 DIAGNOSIS — O0992 Supervision of high risk pregnancy, unspecified, second trimester: Secondary | ICD-10-CM

## 2018-02-09 DIAGNOSIS — Z3A18 18 weeks gestation of pregnancy: Secondary | ICD-10-CM

## 2018-02-09 DIAGNOSIS — O24112 Pre-existing diabetes mellitus, type 2, in pregnancy, second trimester: Secondary | ICD-10-CM

## 2018-02-09 NOTE — Progress Notes (Signed)
   PRENATAL VISIT NOTE  Subjective:  Grace Montgomery is a 27 y.o. [id number] at [redacted]w[redacted]d being seen today for ongoing prenatal care.  She is currently monitored for the following issues for this high-risk pregnancy and has Major depressive disorder; Obesity; Seasonal allergies; Asthma, mild persistent; Rash and nonspecific skin eruption; MDD (major depressive disorder), recurrent severe, without psychosis ([place]); Type 2 diabetes mellitus complicating pregnancy in second trimester, antepartum; Supervision of high risk pregnancy, antepartum; and Round ligament pain on their problem list.  Patient reports no complaints.  Contractions: Not present. Vag. Bleeding: None.  Movement: Present. Denies leaking of fluid.   The following portions of the patient's history were reviewed and updated as appropriate: allergies, current medications, past family history, past medical history, past social history, past surgical history and problem list. Problem list updated.  Objective:   Vitals:   02/09/18 1321  BP: 115/73  Pulse: 83  Weight: 229 lb (103.9 kg)    Fetal Status: Fetal Heart Rate (bpm): 147   Movement: Present     General:  Alert, oriented and cooperative. Patient is in no acute distress.  Skin: Skin is warm and dry. No rash noted.   Cardiovascular: Normal heart rate noted  Respiratory: Normal respiratory effort, no problems with respiration noted  Abdomen: Soft, gravid, appropriate for gestational age.  Pain/Pressure: Absent     Pelvic: Cervical exam deferred        Extremities: Normal range of motion.  Edema: Trace  Mental Status: Normal mood and affect. Normal behavior. Normal judgment and thought content.   Assessment and Plan:  Pregnancy: [id number] at [redacted]w[redacted]d  1. Supervision of high risk pregnancy, antepartum Patient is doing well without complaints Anatomy ultrasound scheduled on 12/9  2. Type 2 diabetes mellitus complicating pregnancy in second trimester, antepartum Patient did not bring  CBG log but reports fasting in 90's. She admits to not checking CBGs postprandial because she falls asleep after meals. Patient advised to put an alarm to check pp CBG Patient to bring log book or meter at next visit  Preterm labor symptoms and general obstetric precautions including but not limited to vaginal bleeding, contractions, leaking of fluid and fetal movement were reviewed in detail with the patient. Please refer to After Visit Summary for other counseling recommendations.  Return in about 3 weeks (around 03/02/2018) for [place], Faculty practice MD only.  Future Appointments  Date Time Provider [place]  02/13/2018  2:30 PM [place] 1 WH-MFCUS MFC-US    Mora Bellman, MD

## 2018-02-09 NOTE — Progress Notes (Signed)
Pt is here for ROB. G4P2 [redacted]w[redacted]d.

## 2018-02-11 LAB — AFP, SERUM, OPEN SPINA BIFIDA
AFP MOM: 1.16
AFP VALUE AFPOSL: 36 ng/mL
Gest. Age on Collection Date: 18.4 weeks
MATERNAL AGE AT EDD: 28.9 a
OSBR Risk 1 IN: 4398
Test Results:: NEGATIVE
WEIGHT: 229 [lb_av]

## 2018-02-13 ENCOUNTER — Encounter (HOSPITAL_COMMUNITY): Payer: Self-pay

## 2018-02-13 ENCOUNTER — Ambulatory Visit (HOSPITAL_COMMUNITY)
Admission: RE | Admit: 2018-02-13 | Discharge: 2018-02-13 | Disposition: A | Discharge status: Home / Self Care | Payer: Medicaid Other | Source: Ambulatory Visit | Attending: Advanced Practice Midwife | Admitting: Advanced Practice Midwife

## 2018-02-13 DIAGNOSIS — O09212 Supervision of pregnancy with history of pre-term labor, second trimester: Secondary | ICD-10-CM

## 2018-02-13 DIAGNOSIS — O09292 Supervision of pregnancy with other poor reproductive or obstetric history, second trimester: Secondary | ICD-10-CM

## 2018-02-13 DIAGNOSIS — Z3A19 19 weeks gestation of pregnancy: Secondary | ICD-10-CM | POA: Diagnosis not present

## 2018-02-13 DIAGNOSIS — O3412 Maternal care for benign tumor of corpus uteri, second trimester: Secondary | ICD-10-CM

## 2018-02-13 DIAGNOSIS — O99212 Obesity complicating pregnancy, second trimester: Secondary | ICD-10-CM

## 2018-02-13 DIAGNOSIS — O0992 Supervision of high risk pregnancy, unspecified, second trimester: Secondary | ICD-10-CM | POA: Diagnosis present

## 2018-02-13 DIAGNOSIS — N949 Unspecified condition associated with female genital organs and menstrual cycle: Secondary | ICD-10-CM

## 2018-02-13 DIAGNOSIS — O99332 Smoking (tobacco) complicating pregnancy, second trimester: Secondary | ICD-10-CM

## 2018-02-13 DIAGNOSIS — O24112 Pre-existing diabetes mellitus, type 2, in pregnancy, second trimester: Secondary | ICD-10-CM | POA: Diagnosis not present

## 2018-02-14 ENCOUNTER — Telehealth: Payer: Self-pay

## 2018-02-14 ENCOUNTER — Other Ambulatory Visit (HOSPITAL_COMMUNITY): Payer: Self-pay | Admitting: *Deleted

## 2018-02-14 DIAGNOSIS — IMO0001 Reserved for inherently not codable concepts without codable children: Secondary | ICD-10-CM

## 2018-02-14 DIAGNOSIS — O24312 Unspecified pre-existing diabetes mellitus in pregnancy, second trimester: Principal | ICD-10-CM

## 2018-02-14 DIAGNOSIS — Z794 Long term (current) use of insulin: Principal | ICD-10-CM

## 2018-02-14 NOTE — Telephone Encounter (Signed)
Returned call, pt is requesting trazodone, provider advised would send zoloft. Pt declined and stated that she needs trazodone to help her sleep and will discuss this at her next appt.

## 2018-03-02 ENCOUNTER — Ambulatory Visit (INDEPENDENT_AMBULATORY_CARE_PROVIDER_SITE_OTHER): Payer: Medicaid Other | Admitting: Obstetrics and Gynecology

## 2018-03-02 ENCOUNTER — Encounter: Payer: Self-pay | Admitting: Obstetrics and Gynecology

## 2018-03-02 VITALS — BP 115/76 | HR 102 | Wt 241.0 lb

## 2018-03-02 DIAGNOSIS — Z6791 Unspecified blood type, Rh negative: Secondary | ICD-10-CM

## 2018-03-02 DIAGNOSIS — O0992 Supervision of high risk pregnancy, unspecified, second trimester: Secondary | ICD-10-CM

## 2018-03-02 DIAGNOSIS — O0990 Supervision of high risk pregnancy, unspecified, unspecified trimester: Secondary | ICD-10-CM

## 2018-03-02 DIAGNOSIS — O26899 Other specified pregnancy related conditions, unspecified trimester: Secondary | ICD-10-CM

## 2018-03-02 DIAGNOSIS — O24112 Pre-existing diabetes mellitus, type 2, in pregnancy, second trimester: Secondary | ICD-10-CM

## 2018-03-02 DIAGNOSIS — O26892 Other specified pregnancy related conditions, second trimester: Secondary | ICD-10-CM

## 2018-03-02 HISTORY — DX: Unspecified blood type, rh negative: Z67.91

## 2018-03-02 HISTORY — DX: Other specified pregnancy related conditions, unspecified trimester: O26.899

## 2018-03-02 MED ORDER — ACCU-CHEK FASTCLIX LANCETS MISC: 1.0000 | Freq: Four times a day (QID) | 5 refills | Status: DC

## 2018-03-02 MED ORDER — GLUCOSE BLOOD VI STRP: ORAL_STRIP | 5 refills | Status: DC

## 2018-03-02 NOTE — Progress Notes (Signed)
   PRENATAL VISIT NOTE  Subjective:  Grace Montgomery is a 27 y.o. [phone] at [redacted]w[redacted]d being seen today for ongoing prenatal care.  She is currently monitored for the following issues for this high-risk pregnancy and has Major depressive disorder; Obesity; Seasonal allergies; Asthma, mild persistent; Rash and nonspecific skin eruption; MDD (major depressive disorder), recurrent severe, without psychosis ([place]); Type 2 diabetes mellitus complicating pregnancy in second trimester, antepartum; Supervision of high risk pregnancy, antepartum; Round ligament pain; and Rh negative state in antepartum period on their problem list.  Patient reports no complaints.  Contractions: Not present. Vag. Bleeding: None.  Movement: Present. Denies leaking of fluid.   The following portions of the patient's history were reviewed and updated as appropriate: allergies, current medications, past family history, past medical history, past social history, past surgical history and problem list. Problem list updated.  Objective:   Vitals:   03/02/18 1324  BP: 115/76  Pulse: (!) 102  Weight: 241 lb (109.3 kg)    Fetal Status:     Movement: Present     General:  Alert, oriented and cooperative. Patient is in no acute distress.  Skin: Skin is warm and dry. No rash noted.   Cardiovascular: Normal heart rate noted  Respiratory: Normal respiratory effort, no problems with respiration noted  Abdomen: Soft, gravid, appropriate for gestational age.  Pain/Pressure: Absent     Pelvic: Cervical exam deferred        Extremities: Normal range of motion.     Mental Status: Normal mood and affect. Normal behavior. Normal judgment and thought content.   Assessment and Plan:  Pregnancy: [id number] at [redacted]w[redacted]d  1. Supervision of high risk pregnancy, antepartum  2. Type 2 diabetes mellitus complicating pregnancy in second trimester, antepartum - does not have log, states [place] [facility] [place] [facility] and she has been unable to access  it - Reports she has been good about taking insulin, taking NPH 17 & 12, regular 8 & 12 - Reports FG 90-100, after dinner 130s - US Fetal Echocardiography; Future - given new supplies, diabetic log today opthamology referral made today  3. Rh negative state in antepartum period Rho gam 28 weeks   Preterm labor symptoms and general obstetric precautions including but not limited to vaginal bleeding, contractions, leaking of fluid and fetal movement were reviewed in detail with the patient. Please refer to After Visit Summary for other counseling recommendations.  Return in about 2 weeks (around 03/16/2018) for OB visit (MD).  Future Appointments  Date Time Provider [place]  03/13/2018  3:00 PM WH-MFC [place] 3 WH-MFCUS MFC-US    Sloan Leiter, MD

## 2018-03-02 NOTE — Patient Instructions (Addendum)
Contraception Choices Contraception, also called birth control, refers to methods or devices that prevent pregnancy. Hormonal methods Contraceptive implant A contraceptive implant is a thin, plastic tube that contains a hormone. It is inserted into the upper part of the arm. It can remain in place for up to 3 years. Progestin-only injections Progestin-only injections are injections of progestin, a synthetic form of the hormone progesterone. They are given every 3 months by a health care provider. Birth control pills Birth control pills are pills that contain hormones that prevent pregnancy. They must be taken once a day, preferably at the same time each day. Birth control patch The birth control patch contains hormones that prevent pregnancy. It is placed on the skin and must be changed once a week for three weeks and removed on the fourth week. A prescription is needed to use this method of contraception. Vaginal ring A vaginal ring contains hormones that prevent pregnancy. It is placed in the vagina for three weeks and removed on the fourth week. After that, the process is repeated with a new ring. A prescription is needed to use this method of contraception. Emergency contraceptive Emergency contraceptives prevent pregnancy after unprotected sex. They come in pill form and can be taken up to 5 days after sex. They work best the sooner they are taken after having sex. Most emergency contraceptives are available without a prescription. This method should not be used as your only form of birth control. Barrier methods Female condom A female condom is a thin sheath that is worn over the penis during sex. Condoms keep sperm from going inside a woman's body. They can be used with a spermicide to increase their effectiveness. They should be disposed after a single use. Female condom A female condom is a soft, loose-fitting sheath that is put into the vagina before sex. The condom keeps sperm from going  inside a woman's body. They should be disposed after a single use.  Intrauterine contraception Intrauterine device (IUD) An IUD is a T-shaped device that is put in a woman's uterus. There are two types:  Hormone IUD.This type contains progestin, a synthetic form of the hormone progesterone. This type can stay in place for 3-5 years.  Copper IUD.This type is wrapped in copper wire. It can stay in place for 10 years.  Permanent methods of contraception Female tubal ligation In this method, a woman's fallopian tubes are sealed, tied, or blocked during surgery to prevent eggs from traveling to the uterus.  Female sterilization This is a procedure to tie off the tubes that carry sperm (vasectomy). After the procedure, the man can still ejaculate fluid (semen).  Summary  Contraception, also called birth control, means methods or devices that prevent pregnancy.  Hormonal methods of contraception include implants, injections, pills, patches, vaginal rings, and emergency contraceptives.  Barrier methods of contraception can include female condoms, female condoms, diaphragms, cervical caps, sponges, and spermicides.  There are two types of IUDs (intrauterine devices). An IUD can be put in a woman's uterus to prevent pregnancy for 3-5 years.  Permanent sterilization can be done through a procedure for males, females, or both. This information is not intended to replace advice given to you by your health care provider. Make sure you discuss any questions you have with your health care provider. Document Released: 02/22/2005 Document Revised: 03/27/2016 Document Reviewed: 03/27/2016 Elsevier Interactive Patient Education  [employer].   Having a circumcision done in the hospital costs approximately $480.  This will have to  be paid in full prior to circumcision being performed.  There are places to have circumcision done as an outpatient which are cheaper.     Circumcisions      Provider   Phone    Price     ------------------------------------------------------------------------------   [facility]  [phone]  $480 by 4 wks     [facility]   [phone]  $[phone] by 4 wks     [facility]   [phone]  $175 by 2 wks    [facility]   [phone]  $250 by 7 days [facility]   [phone]  $220 by 4 wks

## 2018-03-03 ENCOUNTER — Other Ambulatory Visit: Payer: Self-pay

## 2018-03-03 ENCOUNTER — Other Ambulatory Visit: Payer: Self-pay | Admitting: Student

## 2018-03-03 NOTE — Progress Notes (Signed)
Rx fax from pt pharmacy stating Medicaid only pays for ACCU-Check AVIVA test Strips and pt cannot afford Accu chek guide stripes.  Form stamped and faxed back to fill what insurance allows.

## 2018-03-13 ENCOUNTER — Encounter (HOSPITAL_COMMUNITY): Payer: Self-pay

## 2018-03-13 ENCOUNTER — Other Ambulatory Visit (HOSPITAL_COMMUNITY): Payer: Self-pay | Admitting: *Deleted

## 2018-03-13 ENCOUNTER — Ambulatory Visit (HOSPITAL_COMMUNITY)
Admission: RE | Admit: 2018-03-13 | Discharge: 2018-03-13 | Disposition: A | Discharge status: Home / Self Care | Payer: Medicaid Other | Source: Ambulatory Visit | Attending: Advanced Practice Midwife | Admitting: Advanced Practice Midwife

## 2018-03-13 DIAGNOSIS — O24112 Pre-existing diabetes mellitus, type 2, in pregnancy, second trimester: Secondary | ICD-10-CM | POA: Diagnosis not present

## 2018-03-13 DIAGNOSIS — O24312 Unspecified pre-existing diabetes mellitus in pregnancy, second trimester: Secondary | ICD-10-CM | POA: Insufficient documentation

## 2018-03-13 DIAGNOSIS — O26899 Other specified pregnancy related conditions, unspecified trimester: Secondary | ICD-10-CM | POA: Insufficient documentation

## 2018-03-13 DIAGNOSIS — O99332 Smoking (tobacco) complicating pregnancy, second trimester: Secondary | ICD-10-CM | POA: Diagnosis not present

## 2018-03-13 DIAGNOSIS — N949 Unspecified condition associated with female genital organs and menstrual cycle: Secondary | ICD-10-CM | POA: Insufficient documentation

## 2018-03-13 DIAGNOSIS — Z794 Long term (current) use of insulin: Secondary | ICD-10-CM | POA: Diagnosis present

## 2018-03-13 DIAGNOSIS — Z6791 Unspecified blood type, Rh negative: Secondary | ICD-10-CM

## 2018-03-13 DIAGNOSIS — E119 Type 2 diabetes mellitus without complications: Secondary | ICD-10-CM

## 2018-03-13 DIAGNOSIS — O3412 Maternal care for benign tumor of corpus uteri, second trimester: Secondary | ICD-10-CM | POA: Diagnosis not present

## 2018-03-13 DIAGNOSIS — IMO0001 Reserved for inherently not codable concepts without codable children: Secondary | ICD-10-CM

## 2018-03-13 DIAGNOSIS — Z3A23 23 weeks gestation of pregnancy: Secondary | ICD-10-CM

## 2018-03-13 DIAGNOSIS — O09212 Supervision of pregnancy with history of pre-term labor, second trimester: Secondary | ICD-10-CM | POA: Diagnosis not present

## 2018-03-14 ENCOUNTER — Encounter: Payer: Self-pay | Admitting: Obstetrics

## 2018-03-14 ENCOUNTER — Ambulatory Visit (INDEPENDENT_AMBULATORY_CARE_PROVIDER_SITE_OTHER): Payer: Medicaid Other | Admitting: Obstetrics

## 2018-03-14 VITALS — BP 122/82 | HR 108 | Wt 244.0 lb

## 2018-03-14 DIAGNOSIS — O24912 Unspecified diabetes mellitus in pregnancy, second trimester: Secondary | ICD-10-CM

## 2018-03-14 DIAGNOSIS — O0990 Supervision of high risk pregnancy, unspecified, unspecified trimester: Secondary | ICD-10-CM

## 2018-03-14 DIAGNOSIS — Z3A23 23 weeks gestation of pregnancy: Secondary | ICD-10-CM

## 2018-03-14 DIAGNOSIS — O24919 Unspecified diabetes mellitus in pregnancy, unspecified trimester: Secondary | ICD-10-CM

## 2018-03-14 DIAGNOSIS — O0992 Supervision of high risk pregnancy, unspecified, second trimester: Secondary | ICD-10-CM

## 2018-03-14 NOTE — Progress Notes (Signed)
Pt is here for ROB. [redacted]w[redacted]d.

## 2018-03-14 NOTE — Progress Notes (Signed)
Subjective:  Grace Montgomery is a 28 y.o. [phone] at [redacted]w[redacted]d being seen today for ongoing prenatal care.  She is currently monitored for the following issues for this high-risk pregnancy and has Major depressive disorder; Obesity; Seasonal allergies; Asthma, mild persistent; Rash and nonspecific skin eruption; MDD (major depressive disorder), recurrent severe, without psychosis ([place]); Type 2 diabetes mellitus complicating pregnancy in second trimester, antepartum; Supervision of high risk pregnancy, antepartum; Round ligament pain; and Rh negative state in antepartum period on their problem list.  Patient reports no complaints.  Contractions: Not present. Vag. Bleeding: None.  Movement: Present. Denies leaking of fluid.   The following portions of the patient's history were reviewed and updated as appropriate: allergies, current medications, past family history, past medical history, past social history, past surgical history and problem list. Problem list updated.  Objective:   Vitals:   03/14/18 1501  BP: 122/82  Pulse: (!) 108  Weight: 244 lb (110.7 kg)    Fetal Status:     Movement: Present     General:  Alert, oriented and cooperative. Patient is in no acute distress.  Skin: Skin is warm and dry. No rash noted.   Cardiovascular: Normal heart rate noted  Respiratory: Normal respiratory effort, no problems with respiration noted  Abdomen: Soft, gravid, appropriate for gestational age. Pain/Pressure: Absent     Pelvic:  Cervical exam deferred        Extremities: Normal range of motion.     Mental Status: Normal mood and affect. Normal behavior. Normal judgment and thought content.   Urinalysis:      Assessment and Plan:  Pregnancy: [id number] at [redacted]w[redacted]d  1. Supervision of high risk pregnancy, antepartum  2. Diabetes mellitus affecting pregnancy, antepartum - blood sugars well controlled, per patient  Preterm labor symptoms and general obstetric precautions including but not limited  to vaginal bleeding, contractions, leaking of fluid and fetal movement were reviewed in detail with the patient. Please refer to After Visit Summary for other counseling recommendations.  Return in about 2 weeks (around 03/28/2018) for ROB.   Shelly Bombard, MD

## 2018-03-28 ENCOUNTER — Ambulatory Visit (INDEPENDENT_AMBULATORY_CARE_PROVIDER_SITE_OTHER): Payer: Medicaid Other | Admitting: Obstetrics & Gynecology

## 2018-03-28 VITALS — BP 106/73 | HR 120 | Wt 245.3 lb

## 2018-03-28 DIAGNOSIS — O24112 Pre-existing diabetes mellitus, type 2, in pregnancy, second trimester: Secondary | ICD-10-CM

## 2018-03-28 DIAGNOSIS — O26892 Other specified pregnancy related conditions, second trimester: Secondary | ICD-10-CM

## 2018-03-28 DIAGNOSIS — O26899 Other specified pregnancy related conditions, unspecified trimester: Secondary | ICD-10-CM

## 2018-03-28 DIAGNOSIS — O0992 Supervision of high risk pregnancy, unspecified, second trimester: Secondary | ICD-10-CM

## 2018-03-28 DIAGNOSIS — O0990 Supervision of high risk pregnancy, unspecified, unspecified trimester: Secondary | ICD-10-CM

## 2018-03-28 DIAGNOSIS — Z6791 Unspecified blood type, Rh negative: Secondary | ICD-10-CM

## 2018-03-28 LAB — GLUCOSE, POCT (MANUAL RESULT ENTRY): POC GLUCOSE: 148 mg/dL — AB (ref 70–99)

## 2018-03-28 MED ORDER — GLUCOSE BLOOD VI STRP: ORAL_STRIP | 5 refills | Status: DC

## 2018-03-28 MED ORDER — ACCU-CHEK FASTCLIX LANCETS MISC: 1.0000 [IU] | Freq: Four times a day (QID) | 12 refills | Status: DC

## 2018-03-28 MED ORDER — ACCU-CHEK GUIDE W/DEVICE KIT: 1.0000 | PACK | Freq: Four times a day (QID) | 0 refills | Status: DC

## 2018-03-28 NOTE — Patient Instructions (Signed)
Return to clinic for any scheduled appointments or obstetric concerns, or go to MAU for evaluation  

## 2018-03-28 NOTE — Progress Notes (Signed)
   PRENATAL VISIT NOTE  Subjective:  Grace Montgomery is a 28 y.o. [phone] at 82w2dbeing seen today for ongoing prenatal care.  She is currently monitored for the following issues for this high-risk pregnancy and has Major depressive disorder; Obesity; Seasonal allergies; Asthma, mild persistent; Rash and nonspecific skin eruption; MDD (major depressive disorder), recurrent severe, without psychosis ([place]; Type 2 diabetes mellitus complicating pregnancy in second trimester, antepartum; Supervision of high risk pregnancy, antepartum; Round ligament pain; and Rh negative state in antepartum period on their problem list.  Patient reports not checking sugars due to meter malfunction.  Contractions: Not present. Vag. Bleeding: None.  Movement: Present. Denies leaking of fluid.   The following portions of the patient's history were reviewed and updated as appropriate: allergies, current medications, past family history, past medical history, past social history, past surgical history and problem list. Problem list updated.  Objective:   Vitals:   03/28/18 1451  BP: 106/73  Pulse: (!) 120  Weight: 245 lb 4.8 oz (111.3 kg)    Fetal Status: Fetal Heart Rate (bpm): 138   Movement: Present     General:  Alert, oriented and cooperative. Patient is in no acute distress.  Skin: Skin is warm and dry. No rash noted.   Cardiovascular: Normal heart rate noted  Respiratory: Normal respiratory effort, no problems with respiration noted  Abdomen: Soft, gravid, appropriate for gestational age.  Pain/Pressure: Present     Pelvic: Cervical exam deferred        Extremities: Normal range of motion.  Edema: Trace  Mental Status: Normal mood and affect. Normal behavior. Normal judgment and thought content.   Assessment and Plan:  Pregnancy: [id number] 249w2d1. Type 2 diabetes mellitus complicating pregnancy in second trimester, antepartum Meter re-prescribed. She reports good BS prior to meter dysfunction -  POCT Glucose (CBG) Random was 147 today - glucose blood (ACCU-CHEK GUIDE) test strip; Use one test strip to check blood glucose 4 times daily.  Dispense: 100 each; Refill: 5 - ACCU-CHEK FASTCLIX LANCETS MISC; 1 Units by Percutaneous route 4 (four) times daily.  Dispense: 100 each; Refill: 12 - Blood Glucose Monitoring Suppl (ACCU-CHEK GUIDE) w/Device KIT; 1 Device by Does not apply route 4 (four) times daily.  Dispense: 1 kit; Refill: 0  2. Rh negative state in antepartum period Will get Rhogam next visit.   3. Supervision of high risk pregnancy, antepartum Preterm labor symptoms and general obstetric precautions including but not limited to vaginal bleeding, contractions, leaking of fluid and fetal movement were reviewed in detail with the patient. Please refer to After Visit Summary for other counseling recommendations.  Return in about 3 weeks (around 04/18/2018) for 3rd trimester labs, TDap, Rhogam, OB Visit.  Future Appointments  Date Time Provider [place]2/06/2018  2:30 PM WH-MFC [place] WH-MFCUS MFC-US    UgVerita SchneidersMD

## 2018-03-29 ENCOUNTER — Other Ambulatory Visit: Payer: Self-pay

## 2018-03-29 DIAGNOSIS — O24112 Pre-existing diabetes mellitus, type 2, in pregnancy, second trimester: Secondary | ICD-10-CM

## 2018-03-29 MED ORDER — ACCU-CHEK GUIDE W/DEVICE KIT: 1.0000 | PACK | Freq: Four times a day (QID) | 0 refills | Status: DC

## 2018-03-31 ENCOUNTER — Encounter: Payer: Self-pay | Admitting: Family Medicine

## 2018-04-03 ENCOUNTER — Other Ambulatory Visit: Payer: Self-pay

## 2018-04-03 ENCOUNTER — Inpatient Hospital Stay (HOSPITAL_COMMUNITY)
Admission: AD | Admit: 2018-04-03 | Discharge: 2018-04-03 | Disposition: A | Discharge status: Home / Self Care | Payer: Medicaid Other | Attending: Obstetrics and Gynecology | Admitting: Obstetrics and Gynecology

## 2018-04-03 ENCOUNTER — Encounter (HOSPITAL_COMMUNITY): Payer: Self-pay

## 2018-04-03 DIAGNOSIS — Z3A26 26 weeks gestation of pregnancy: Secondary | ICD-10-CM

## 2018-04-03 DIAGNOSIS — R0981 Nasal congestion: Secondary | ICD-10-CM | POA: Insufficient documentation

## 2018-04-03 DIAGNOSIS — R531 Weakness: Secondary | ICD-10-CM | POA: Insufficient documentation

## 2018-04-03 DIAGNOSIS — R05 Cough: Secondary | ICD-10-CM | POA: Insufficient documentation

## 2018-04-03 DIAGNOSIS — O26892 Other specified pregnancy related conditions, second trimester: Secondary | ICD-10-CM

## 2018-04-03 DIAGNOSIS — J111 Influenza due to unidentified influenza virus with other respiratory manifestations: Secondary | ICD-10-CM

## 2018-04-03 DIAGNOSIS — R69 Illness, unspecified: Secondary | ICD-10-CM | POA: Diagnosis not present

## 2018-04-03 LAB — URINALYSIS, ROUTINE W REFLEX MICROSCOPIC
Bilirubin Urine: NEGATIVE
Glucose, UA: 50 mg/dL — AB
Hgb urine dipstick: NEGATIVE
KETONES UR: NEGATIVE mg/dL
Leukocytes, UA: NEGATIVE
Nitrite: NEGATIVE
Protein, ur: NEGATIVE mg/dL
Specific Gravity, Urine: 1.026 (ref 1.005–1.030)
pH: 6 (ref 5.0–8.0)

## 2018-04-03 LAB — INFLUENZA PANEL BY PCR (TYPE A & B)
INFLAPCR: NEGATIVE
Influenza B By PCR: NEGATIVE

## 2018-04-03 MED ORDER — ONDANSETRON 8 MG PO TBDP
8.0000 mg | ORAL_TABLET | Freq: Once | ORAL | Status: AC
  Administered 2018-04-03: 8 mg via ORAL
  Filled 2018-04-03: qty 1

## 2018-04-03 MED ORDER — ONDANSETRON 4 MG PO TBDP: 4.0000 mg | ORAL_TABLET | Freq: Three times a day (TID) | ORAL | 0 refills | Status: DC | PRN

## 2018-04-03 MED ORDER — GUAIFENESIN-DM 100-10 MG/5ML PO SYRP
10.0000 mL | ORAL_SOLUTION | Freq: Once | ORAL | Status: AC
  Administered 2018-04-03: 10 mL via ORAL
  Filled 2018-04-03: qty 10

## 2018-04-03 NOTE — Discharge Instructions (Signed)

## 2018-04-03 NOTE — MAU Provider Note (Signed)
Chief Complaint: Emesis; Nausea; Cough; Nasal Congestion; and Sore Throat   First Provider Initiated Contact with Patient 04/03/18 1151     SUBJECTIVE HPI: Grace Montgomery is a 28 y.o. [id number] at 40w1dwho presents to Maternity Admissions reporting weakness, cough, & congestion. Symptoms started 4 days ago. Hasn't checked temp at home but reports chills. Symptoms initially sore throat but have changed and worsened. Currently reports nasal congestion and productive cough. Also complains of weakness and feeling tired. Coughs so much that she vomits.  Denies abdominal pain, ear pain, headache, vaginal bleeding, dysuria, or LOF. + fetal movement.   Currently denies pain. Hasn't treated symptoms.   Past Medical History:  Diagnosis Date  . Anxiety   . Asthma   . Bipolar disorder ([place]   . Cannabis dependence   . Diabetes mellitus without complication ([place]   . 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 NO PAST SURGERIES     Social History   Socioeconomic History  . Marital status: Single    Spouse name: Not on file  . Number of children: 2  . Years of education: Not on file  . Highest education level: Not on file  Occupational History  . Occupation: [employer]  Social Needs  . Financial resource strain: Not on file  . Food insecurity:    Worry: Not on file    Inability: Not on file  . Transportation needs:    Medical: Not on file    Non-medical: Not on file  Tobacco Use  . Smoking status: Former Smoker    Packs/day: 0.25    Types: Cigarettes  . Smokeless tobacco: Never Used  . Tobacco  comment: quit November 2019  Substance and Sexual Activity  . Alcohol use: Not Currently    Comment: ocassionally  . Drug use: Not Currently    Comment: History of Marijuana  . Sexual activity: Yes    Birth control/protection: None  Lifestyle  . Physical activity:    Days per week: Not on file    Minutes per session: Not on file  . Stress: Not on file  Relationships  . Social connections:    Talks on phone: Not on file    Gets together: Not on file    Attends religious service: Not on file    Active member of club or organization: Not on file    Attends meetings of clubs or organizations: Not on file    Relationship status: Not on file  . Intimate partner violence:    Fear of current or ex partner: Not on file    Emotionally abused: Not on file    Physically abused: Not on file    Forced sexual activity: Not on file  Other Topics Concern  . Not on file  Social History Narrative   Student for psychotherapy   2 Children - Boy(4yo) & Girl (  3yo)         Family History  Problem Relation Age of Onset  . Sickle cell anemia Mother   . Depression Mother   . Diabetes Mother   . Diabetes Other        grandparents and aunts/uncles  . Stroke Other        grandparent   No current facility-administered medications on file prior to encounter.    Current Outpatient Medications on File Prior to Encounter  Medication Sig Dispense Refill  . ACCU-CHEK FASTCLIX LANCETS MISC 1 each by Percutaneous route 4 (four) times daily. 100 each 5  . ACCU-CHEK FASTCLIX LANCETS MISC 1 Units by Percutaneous route 4 (four) times daily. 100 each 12  . acetaminophen (TYLENOL) 500 MG tablet Take 1 tablet (500 mg total) by mouth every 6 (six) hours as needed. 30 tablet 0  . aspirin EC 81 MG tablet Take 1 tablet (81 mg total) by mouth daily. Take after 12 weeks for prevention of preeclampsia later in pregnancy 300 tablet 2  . Blood Glucose Monitoring Suppl (ACCU-CHEK GUIDE) w/Device KIT 1 Device by Does not  apply route 4 (four) times daily. 1 kit 0  . famotidine (PEPCID) 20 MG tablet TAKE ONE TABLET BY MOUTH TWO TIMES DAILY. (TAN SQUARE TAB WITH CC 60) 60 tablet 1  . glucose blood (ACCU-CHEK GUIDE) test strip Use one test strip to check blood glucose 4 times daily. 100 each 5  . insulin NPH Human (HUMULIN N) 100 UNIT/ML injection Take 17 units SQ qd every morning before breakfast and 12 units SQ qd before dinner 10 mL 11  . insulin regular (HUMULIN R) 100 units/mL injection Take 8 units SQ qd before breakfast and 12 units SQ qd before dinner 10 mL 11  . Prenat-FeAsp-Meth-FA-DHA w/o A (PRENATE PIXIE) 10-0.6-0.4-200 MG CAPS Take 1 capsule by mouth daily. 30 capsule 11  . traZODone (DESYREL) 50 MG tablet Take 50 mg by mouth at bedtime.     Allergies  Allergen Reactions  . Monistat [Tioconazole] Swelling  . Chocolate Itching and Other (See Comments)    Ears & throat itch  . Banana Itching and Other (See Comments)    Throat itching    I have reviewed patient's Past Medical Hx, Surgical Hx, Family Hx, Social Hx, medications and allergies.   Review of Systems  Constitutional: Positive for chills. Negative for fever.  HENT: Positive for congestion and sore throat (not currently). Negative for ear pain.   Respiratory: Positive for cough. Negative for shortness of breath and wheezing.   Gastrointestinal: Positive for vomiting (with coughing). Negative for abdominal pain, constipation, diarrhea and nausea.  Genitourinary: Negative.   Neurological: Negative for headaches.    OBJECTIVE Patient Vitals for the past 24 hrs:  BP Temp Temp src Pulse Resp SpO2 Height Weight  04/03/18 1322 118/77 - - - 18 - - -  04/03/18 1116 125/70 98.5 F (36.9 C) Oral (!) 104 19 98 % '5\' 3"'$  (1.6 m) 111.6 kg   Constitutional: Well-developed, well-nourished female in no acute distress.  Cardiovascular: normal rate & rhythm, no murmur Respiratory: normal rate and effort. Lung sounds clear throughout GI: Abd soft,  non-tender, Pos BS x 4. No guarding or rebound tenderness MS: Extremities nontender, no edema, normal ROM Neurologic: Alert and oriented x 4.  HEENT: TM normal bilaterally. No sinus tenderness. Oropharynx pink, no erythema or exudate.     NST:  Baseline: 130 bpm, Variability: Good {> 6 bpm), Accelerations: Reactive and Decelerations: Absent  LAB RESULTS Results for orders placed or performed during the hospital encounter of 04/03/18 (from the past 24 hour(s))  Influenza panel by PCR (type A & B)     Status: None   Collection Time: 04/03/18 11:57 AM  Result Value Ref Range   Influenza A By PCR NEGATIVE NEGATIVE   Influenza B By PCR NEGATIVE NEGATIVE  Urinalysis, Routine w reflex microscopic     Status: Abnormal   Collection Time: 04/03/18 12:17 PM  Result Value Ref Range   Color, Urine YELLOW YELLOW   APPearance HAZY (A) CLEAR   Specific Gravity, Urine 1.026 1.005 - 1.030   pH 6.0 5.0 - 8.0   Glucose, UA 50 (A) NEGATIVE mg/dL   Hgb urine dipstick NEGATIVE NEGATIVE   Bilirubin Urine NEGATIVE NEGATIVE   Ketones, ur NEGATIVE NEGATIVE mg/dL   Protein, ur NEGATIVE NEGATIVE mg/dL   Nitrite NEGATIVE NEGATIVE   Leukocytes, UA NEGATIVE NEGATIVE    IMAGING No results found.  MAU COURSE Orders Placed This Encounter  Procedures  . Influenza panel by PCR (type A & B)  . Urinalysis, Routine w reflex microscopic  . Discharge patient   Meds ordered this encounter  Medications  . ondansetron (ZOFRAN-ODT) disintegrating tablet 8 mg  . guaiFENesin-dextromethorphan (ROBITUSSIN DM) 100-10 MG/5ML syrup 10 mL  . ondansetron (ZOFRAN ODT) 4 MG disintegrating tablet    Sig: Take 1 tablet (4 mg total) by mouth every 8 (eight) hours as needed for nausea or vomiting.    Dispense:  15 tablet    Refill:  0    Order Specific Question:   Supervising Provider    Answer:   CONSTANT, Craig    MDM Fetal tracing appropriate for gestation Pt afebrile. Lung sounds clear. Given zofran &  robitussin DM in MAU Discharged home with list of OTC meds safe in pregnancy. Flu swab pending. Discussed symptomatic tx.   ASSESSMENT 1. Influenza-like illness   2. [redacted] weeks gestation of pregnancy     PLAN Discharge home in stable condition.   Allergies as of 04/03/2018      Reactions   Monistat [tioconazole] Swelling   Chocolate Itching, Other (See Comments)   Ears & throat itch   Banana Itching, Other (See Comments)   Throat itching      Medication List    STOP taking these medications   erythromycin ophthalmic ointment   guaiFENesin 100 MG/5ML Soln Commonly known as:  ROBITUSSIN   metroNIDAZOLE 500 MG tablet Commonly known as:  FLAGYL   VITAFOL-NANO 18-0.6-0.4 MG Tabs     TAKE these medications   ACCU-CHEK FASTCLIX LANCETS Misc 1 each by Percutaneous route 4 (four) times daily.   ACCU-CHEK FASTCLIX LANCETS Misc 1 Units by Percutaneous route 4 (four) times daily.   ACCU-CHEK GUIDE w/Device Kit 1 Device by Does not apply route 4 (four) times daily.   acetaminophen 500 MG tablet Commonly known as:  TYLENOL Take 1 tablet (500 mg total) by mouth every 6 (six) hours as needed.   aspirin EC 81 MG tablet Take 1 tablet (81 mg total) by mouth daily. Take after 12 weeks for prevention of preeclampsia later in pregnancy   famotidine 20 MG tablet Commonly known as:  PEPCID TAKE ONE TABLET BY MOUTH TWO TIMES DAILY. (TAN SQUARE TAB WITH CC 60)   glucose blood test strip Commonly known as:  ACCU-CHEK GUIDE Use one test strip to check blood glucose 4 times daily.   insulin NPH Human 100 UNIT/ML injection Commonly known  as:  HUMULIN N Take 17 units SQ qd every morning before breakfast and 12 units SQ qd before dinner   insulin regular 100 units/mL injection Commonly known as:  HUMULIN R Take 8 units SQ qd before breakfast and 12 units SQ qd before dinner   ondansetron 4 MG disintegrating tablet Commonly known as:  ZOFRAN ODT Take 1 tablet (4 mg total) by mouth  every 8 (eight) hours as needed for nausea or vomiting.   PRENATE PIXIE 10-0.6-0.4-200 MG Caps Take 1 capsule by mouth daily.   traZODone 50 MG tablet Commonly known as:  DESYREL Take 50 mg by mouth at bedtime.        Jorje Guild, NP 04/03/2018  6:08 PM

## 2018-04-03 NOTE — MAU Note (Signed)
Pt reports nausea and vomiting x 4 days, feels weak. Sore throat, cough, congestion. Denies fever.

## 2018-04-11 ENCOUNTER — Encounter (HOSPITAL_COMMUNITY): Payer: Self-pay

## 2018-04-11 ENCOUNTER — Ambulatory Visit (HOSPITAL_COMMUNITY)
Admission: RE | Admit: 2018-04-11 | Discharge: 2018-04-11 | Disposition: A | Discharge status: Home / Self Care | Payer: Medicaid Other | Source: Ambulatory Visit | Attending: Advanced Practice Midwife | Admitting: Advanced Practice Midwife

## 2018-04-11 DIAGNOSIS — O9989 Other specified diseases and conditions complicating pregnancy, childbirth and the puerperium: Secondary | ICD-10-CM

## 2018-04-11 DIAGNOSIS — J45909 Unspecified asthma, uncomplicated: Secondary | ICD-10-CM | POA: Diagnosis not present

## 2018-04-11 DIAGNOSIS — O09212 Supervision of pregnancy with history of pre-term labor, second trimester: Secondary | ICD-10-CM | POA: Diagnosis not present

## 2018-04-11 DIAGNOSIS — O99332 Smoking (tobacco) complicating pregnancy, second trimester: Secondary | ICD-10-CM

## 2018-04-11 DIAGNOSIS — O26899 Other specified pregnancy related conditions, unspecified trimester: Secondary | ICD-10-CM | POA: Diagnosis present

## 2018-04-11 DIAGNOSIS — O99212 Obesity complicating pregnancy, second trimester: Secondary | ICD-10-CM

## 2018-04-11 DIAGNOSIS — O3412 Maternal care for benign tumor of corpus uteri, second trimester: Secondary | ICD-10-CM

## 2018-04-11 DIAGNOSIS — Z6791 Unspecified blood type, Rh negative: Secondary | ICD-10-CM | POA: Insufficient documentation

## 2018-04-11 DIAGNOSIS — D259 Leiomyoma of uterus, unspecified: Secondary | ICD-10-CM

## 2018-04-11 DIAGNOSIS — E119 Type 2 diabetes mellitus without complications: Secondary | ICD-10-CM | POA: Insufficient documentation

## 2018-04-11 DIAGNOSIS — N949 Unspecified condition associated with female genital organs and menstrual cycle: Secondary | ICD-10-CM | POA: Diagnosis present

## 2018-04-11 DIAGNOSIS — O09292 Supervision of pregnancy with other poor reproductive or obstetric history, second trimester: Secondary | ICD-10-CM

## 2018-04-11 DIAGNOSIS — O24112 Pre-existing diabetes mellitus, type 2, in pregnancy, second trimester: Secondary | ICD-10-CM

## 2018-04-11 DIAGNOSIS — Z3A27 27 weeks gestation of pregnancy: Secondary | ICD-10-CM

## 2018-04-12 ENCOUNTER — Encounter: Payer: Self-pay | Admitting: Obstetrics

## 2018-04-12 ENCOUNTER — Other Ambulatory Visit (HOSPITAL_COMMUNITY): Payer: Self-pay | Admitting: *Deleted

## 2018-04-12 DIAGNOSIS — Z794 Long term (current) use of insulin: Principal | ICD-10-CM

## 2018-04-12 DIAGNOSIS — O24313 Unspecified pre-existing diabetes mellitus in pregnancy, third trimester: Principal | ICD-10-CM

## 2018-04-12 DIAGNOSIS — IMO0001 Reserved for inherently not codable concepts without codable children: Secondary | ICD-10-CM

## 2018-04-17 ENCOUNTER — Encounter: Payer: Self-pay | Admitting: Obstetrics

## 2018-04-19 ENCOUNTER — Other Ambulatory Visit (HOSPITAL_COMMUNITY)
Admission: RE | Admit: 2018-04-19 | Discharge: 2018-04-19 | Disposition: A | Discharge status: Home / Self Care | Payer: Medicaid Other | Source: Ambulatory Visit | Attending: Obstetrics | Admitting: Obstetrics

## 2018-04-19 ENCOUNTER — Ambulatory Visit (INDEPENDENT_AMBULATORY_CARE_PROVIDER_SITE_OTHER): Payer: Medicaid Other | Admitting: Obstetrics

## 2018-04-19 VITALS — BP 127/74 | HR 108 | Wt 253.0 lb

## 2018-04-19 DIAGNOSIS — N898 Other specified noninflammatory disorders of vagina: Secondary | ICD-10-CM | POA: Diagnosis not present

## 2018-04-19 DIAGNOSIS — O26899 Other specified pregnancy related conditions, unspecified trimester: Secondary | ICD-10-CM

## 2018-04-19 DIAGNOSIS — O0990 Supervision of high risk pregnancy, unspecified, unspecified trimester: Secondary | ICD-10-CM

## 2018-04-19 DIAGNOSIS — O24919 Unspecified diabetes mellitus in pregnancy, unspecified trimester: Secondary | ICD-10-CM

## 2018-04-19 DIAGNOSIS — O360930 Maternal care for other rhesus isoimmunization, third trimester, not applicable or unspecified: Secondary | ICD-10-CM | POA: Diagnosis not present

## 2018-04-19 DIAGNOSIS — Z23 Encounter for immunization: Secondary | ICD-10-CM | POA: Diagnosis not present

## 2018-04-19 DIAGNOSIS — Z6791 Unspecified blood type, Rh negative: Secondary | ICD-10-CM

## 2018-04-19 DIAGNOSIS — O24913 Unspecified diabetes mellitus in pregnancy, third trimester: Secondary | ICD-10-CM

## 2018-04-19 DIAGNOSIS — O0993 Supervision of high risk pregnancy, unspecified, third trimester: Secondary | ICD-10-CM

## 2018-04-19 DIAGNOSIS — O26893 Other specified pregnancy related conditions, third trimester: Secondary | ICD-10-CM

## 2018-04-19 MED ORDER — TERCONAZOLE 0.4 % VA CREA: 1.0000 | TOPICAL_CREAM | Freq: Every day | VAGINAL | 0 refills | Status: DC

## 2018-04-19 MED ORDER — RHO D IMMUNE GLOBULIN 1500 UNIT/2ML IJ SOSY
300.0000 ug | PREFILLED_SYRINGE | Freq: Once | INTRAMUSCULAR | Status: AC
  Administered 2018-04-19: 300 ug via INTRAMUSCULAR

## 2018-04-19 MED ORDER — FLUCONAZOLE 150 MG PO TABS: 150.0000 mg | ORAL_TABLET | Freq: Once | ORAL | 0 refills | Status: DC

## 2018-04-19 NOTE — Progress Notes (Signed)
Patient reports vaginal itching for the last 3 days - switched soaps.

## 2018-04-19 NOTE — Patient Instructions (Signed)
Tdap Vaccine (Tetanus, Diphtheria and Pertussis): What You Need to Know  1. Why get vaccinated?  Tetanus, diphtheria and pertussis are very serious diseases. Tdap vaccine can protect us from these diseases. And, Tdap vaccine given to pregnant women can protect newborn babies against pertussis..  TETANUS (Lockjaw) is rare in the [place] today. It causes painful muscle tightening and stiffness, usually all over the body.  · It can lead to tightening of muscles in the head and neck so you can't open your mouth, swallow, or sometimes even breathe. Tetanus kills about 1 out of 10 people who are infected even after receiving the best medical care.  DIPHTHERIA is also rare in the [place] today. It can cause a thick coating to form in the back of the throat.  · It can lead to breathing problems, heart failure, paralysis, and death.  PERTUSSIS (Whooping Cough) causes severe coughing spells, which can cause difficulty breathing, vomiting and disturbed sleep.  · It can also lead to weight loss, incontinence, and rib fractures. Up to 2 in 100 adolescents and 5 in 100 adults with pertussis are hospitalized or have complications, which could include pneumonia or death.  These diseases are caused by bacteria. Diphtheria and pertussis are spread from person to person through secretions from coughing or sneezing. Tetanus enters the body through cuts, scratches, or wounds.  Before vaccines, as many as 200,000 cases of diphtheria, 200,000 cases of pertussis, and hundreds of cases of tetanus, were reported in the [place] each year. Since vaccination began, reports of cases for tetanus and diphtheria have dropped by about 99% and for pertussis by about 80%.  2. Tdap vaccine  Tdap vaccine can protect adolescents and adults from tetanus, diphtheria, and pertussis. One dose of Tdap is routinely given at age 11 or 12. People who did not get Tdap at that age should get it as soon as possible.  Tdap is especially important  for healthcare professionals and anyone having close contact with a baby younger than 12 months.  Pregnant women should get a dose of Tdap during every pregnancy, to protect the newborn from pertussis. Infants are most at risk for severe, life-threatening complications from pertussis.  Another vaccine, called Td, protects against tetanus and diphtheria, but not pertussis. A Td booster should be given every 10 years. Tdap may be given as one of these boosters if you have never gotten Tdap before. Tdap may also be given after a severe cut or burn to prevent tetanus infection.  Your doctor or the person giving you the vaccine can give you more information.  Tdap may safely be given at the same time as other vaccines.  3. Some people should not get this vaccine  · A person who has ever had a life-threatening allergic reaction after a previous dose of any diphtheria, tetanus or pertussis containing vaccine, OR has a severe allergy to any part of this vaccine, should not get Tdap vaccine. Tell the person giving the vaccine about any severe allergies.  · Anyone who had coma or long repeated seizures within 7 days after a childhood dose of DTP or DTaP, or a previous dose of Tdap, should not get Tdap, unless a cause other than the vaccine was found. They can still get Td.  · Talk to your doctor if you:  ? have seizures or another nervous system problem,  ? had severe pain or swelling after any vaccine containing diphtheria, tetanus or pertussis,  ? ever had a condition   called Guillain-Barré Syndrome (GBS),  ? aren't feeling well on the day the shot is scheduled.  4. Risks  With any medicine, including vaccines, there is a chance of side effects. These are usually mild and go away on their own. Serious reactions are also possible but are rare.  Most people who get Tdap vaccine do not have any problems with it.  Mild problems following Tdap  (Did not interfere with activities)  · Pain where the shot was given (about 3 in 4  adolescents or 2 in 3 adults)  · Redness or swelling where the shot was given (about 1 person in 5)  · Mild fever of at least 100.4°F (up to about 1 in 25 adolescents or 1 in 100 adults)  · Headache (about 3 or 4 people in 10)  · Tiredness (about 1 person in 3 or 4)  · Nausea, vomiting, diarrhea, stomach ache (up to 1 in 4 adolescents or 1 in 10 adults)  · Chills, sore joints (about 1 person in 10)  · Body aches (about 1 person in 3 or 4)  · Rash, swollen glands (uncommon)  Moderate problems following Tdap  (Interfered with activities, but did not require medical attention)  · Pain where the shot was given (up to 1 in 5 or 6)  · Redness or swelling where the shot was given (up to about 1 in 16 adolescents or 1 in 12 adults)  · Fever over 102°F (about 1 in 100 adolescents or 1 in 250 adults)  · Headache (about 1 in 7 adolescents or 1 in 10 adults)  · Nausea, vomiting, diarrhea, stomach ache (up to 1 or 3 people in 100)  · Swelling of the entire arm where the shot was given (up to about 1 in 500).  Severe problems following Tdap  (Unable to perform usual activities; required medical attention)  · Swelling, severe pain, bleeding and redness in the arm where the shot was given (rare).  Problems that could happen after any vaccine:  · People sometimes faint after a medical procedure, including vaccination. Sitting or lying down for about 15 minutes can help prevent fainting, and injuries caused by a fall. Tell your doctor if you feel dizzy, or have vision changes or ringing in the ears.  · Some people get severe pain in the shoulder and have difficulty moving the arm where a shot was given. This happens very rarely.  · Any medication can cause a severe allergic reaction. Such reactions from a vaccine are very rare, estimated at fewer than 1 in a million doses, and would happen within a few minutes to a few hours after the vaccination.  As with any medicine, there is a very remote chance of a vaccine causing a serious  injury or death.  The safety of vaccines is always being monitored. For more information, visit: www.cdc.gov/vaccinesafety/  5. What if there is a serious problem?  What should I look for?  · Look for anything that concerns you, such as signs of a severe allergic reaction, very high fever, or unusual behavior.  Signs of a severe allergic reaction can include hives, swelling of the face and throat, difficulty breathing, a fast heartbeat, dizziness, and weakness. These would usually start a few minutes to a few hours after the vaccination.  What should I do?  · If you think it is a severe allergic reaction or other emergency that can't wait, call 9-1-1 or get the person to the nearest hospital. Otherwise,   call your doctor.  · Afterward, the reaction should be reported to the Vaccine Adverse Event Reporting System (VAERS). Your doctor might file this report, or you can do it yourself through the VAERS web site at www.vaers.hhs.gov, or by calling 1-800-822-7967.  VAERS does not give medical advice.  6. The National Vaccine Injury Compensation Program  The National Vaccine Injury Compensation Program (VICP) is a federal program that was created to compensate people who may have been injured by certain vaccines.  Persons who believe they may have been injured by a vaccine can learn about the program and about filing a claim by calling 1-800-338-2382 or visiting the VICP website at www.hrsa.gov/vaccinecompensation. There is a time limit to file a claim for compensation.  7. How can I learn more?  · Ask your doctor. He or she can give you the vaccine package insert or suggest other sources of information.  · Call your local or state health department.  · Contact the Centers for Disease Control and Prevention (CDC):  ? Call 1-800-232-4636 (1-800-CDC-INFO) or  ? Visit CDC's website at www.cdc.gov/vaccines  Vaccine Information Statement Tdap Vaccine (05/01/2013)  This information is not intended to replace advice given to you  by your health care provider. Make sure you discuss any questions you have with your health care provider.  Document Released: 08/24/2011 Document Revised: 10/10/2017 Document Reviewed: 10/10/2017  Elsevier Interactive Patient Education © 2019 Elsevier Inc.

## 2018-04-20 ENCOUNTER — Encounter: Payer: Self-pay | Admitting: Obstetrics

## 2018-04-20 LAB — CBC
Hematocrit: 36.2 % (ref 34.0–46.6)
Hemoglobin: 12.6 g/dL (ref 11.1–15.9)
MCH: 32.3 pg (ref 26.6–33.0)
MCHC: 34.8 g/dL (ref 31.5–35.7)
MCV: 93 fL (ref 79–97)
Platelets: 309 10*3/uL (ref 150–450)
RBC: 3.9 x10E6/uL (ref 3.77–5.28)
RDW: 13.2 % (ref 11.7–15.4)
WBC: 9.6 10*3/uL (ref 3.4–10.8)

## 2018-04-20 LAB — RPR: RPR Ser Ql: NONREACTIVE

## 2018-04-20 LAB — HIV ANTIBODY (ROUTINE TESTING W REFLEX): HIV Screen 4th Generation wRfx: NONREACTIVE

## 2018-04-20 NOTE — Progress Notes (Signed)
Subjective:  MERANDA DECHAINE is a 28 y.o. [id number] at [redacted]w[redacted]d being seen today for ongoing prenatal care.  She is currently monitored for the following issues for this high-risk pregnancy and has Major depressive disorder; Obesity; Seasonal allergies; Asthma, mild persistent; Rash and nonspecific skin eruption; MDD (major depressive disorder), recurrent severe, without psychosis ([place]); Type 2 diabetes mellitus complicating pregnancy in second trimester, antepartum; Supervision of high risk pregnancy, antepartum; Round ligament pain; and Rh negative state in antepartum period on their problem list.  Patient reports vaginal irritation.  Contractions: Not present. Vag. Bleeding: None.  Movement: Present. Denies leaking of fluid.   The following portions of the patient's history were reviewed and updated as appropriate: allergies, current medications, past family history, past medical history, past social history, past surgical history and problem list. Problem list updated.  Objective:   Vitals:   04/19/18 1527  BP: 127/74  Pulse: (!) 108  Weight: 253 lb (114.8 kg)    Fetal Status:     Movement: Present     General:  Alert, oriented and cooperative. Patient is in no acute distress.  Skin: Skin is warm and dry. No rash noted.   Cardiovascular: Normal heart rate noted  Respiratory: Normal respiratory effort, no problems with respiration noted  Abdomen: Soft, gravid, appropriate for gestational age. Pain/Pressure: Absent     Pelvic:  Cervical exam deferred        Extremities: Normal range of motion.  Edema: None  Mental Status: Normal mood and affect. Normal behavior. Normal judgment and thought content.   Urinalysis:      Assessment and Plan:  Pregnancy: [id number] at [redacted]w[redacted]d  1. Supervision of high risk pregnancy, antepartum Rx - Tdap vaccine greater than or equal to 7yo IM - CBC - RPR - HIV Antibody (routine testing w rflx)  2. Diabetes mellitus affecting pregnancy, antepartum - poor  glucose control.  Insulin adjusted.  3. Vaginal itching Rx: - Cervicovaginal ancillary only( [place]) - terconazole (TERAZOL 7) 0.4 % vaginal cream; Place 1 applicator vaginally at bedtime.  Dispense: 45 g; Refill: 0  4. Rh negative state in antepartum period Rx: - rho (d) immune globulin (RHIG/RHOPHYLAC) injection 300 mcg   Preterm labor symptoms and general obstetric precautions including but not limited to vaginal bleeding, contractions, leaking of fluid and fetal movement were reviewed in detail with the patient. Please refer to After Visit Summary for other counseling recommendations.  Return in about 2 weeks (around 05/03/2018) for ROB.   Shelly Bombard, MD

## 2018-04-21 ENCOUNTER — Other Ambulatory Visit: Payer: Self-pay | Admitting: Obstetrics

## 2018-04-21 LAB — CERVICOVAGINAL ANCILLARY ONLY
Bacterial vaginitis: NEGATIVE
CHLAMYDIA, DNA PROBE: NEGATIVE
Candida vaginitis: POSITIVE — AB
Neisseria Gonorrhea: NEGATIVE
Trichomonas: NEGATIVE

## 2018-04-26 ENCOUNTER — Encounter: Payer: Self-pay | Admitting: Obstetrics & Gynecology

## 2018-04-26 ENCOUNTER — Ambulatory Visit (INDEPENDENT_AMBULATORY_CARE_PROVIDER_SITE_OTHER): Payer: Medicaid Other | Admitting: Obstetrics & Gynecology

## 2018-04-26 VITALS — BP 113/74 | HR 98 | Wt 255.0 lb

## 2018-04-26 DIAGNOSIS — O0990 Supervision of high risk pregnancy, unspecified, unspecified trimester: Secondary | ICD-10-CM

## 2018-04-26 DIAGNOSIS — O24112 Pre-existing diabetes mellitus, type 2, in pregnancy, second trimester: Secondary | ICD-10-CM

## 2018-04-26 DIAGNOSIS — O0992 Supervision of high risk pregnancy, unspecified, second trimester: Secondary | ICD-10-CM

## 2018-04-26 DIAGNOSIS — Z3A29 29 weeks gestation of pregnancy: Secondary | ICD-10-CM

## 2018-04-26 MED ORDER — INSULIN REGULAR HUMAN 100 UNIT/ML IJ SOLN: INTRAMUSCULAR | 11 refills | Status: DC

## 2018-04-26 MED ORDER — INSULIN NPH (HUMAN) (ISOPHANE) 100 UNIT/ML ~~LOC~~ SUSP: SUBCUTANEOUS | 11 refills | Status: DC

## 2018-04-26 NOTE — Progress Notes (Signed)
   PRENATAL VISIT NOTE  Subjective:  Grace Montgomery is a 28 y.o. G2R4270 at [redacted]w[redacted]d being seen today for ongoing prenatal care.  She is currently monitored for the following issues for this high-risk pregnancy and has Major depressive disorder; Obesity; Seasonal allergies; Asthma, mild persistent; Rash and nonspecific skin eruption; MDD (major depressive disorder), recurrent severe, without psychosis ([place]); Type 2 diabetes mellitus complicating pregnancy in second trimester, antepartum; Supervision of high risk pregnancy, antepartum; Round ligament pain; and Rh negative state in antepartum period on their problem list.  Patient reports mild LE edema.  Contractions: Not present. Vag. Bleeding: None.  Movement: Present. Denies leaking of fluid.   The following portions of the patient's history were reviewed and updated as appropriate: allergies, current medications, past family history, past medical history, past social history, past surgical history and problem list. Problem list updated.  Objective:   Vitals:   04/26/18 1623  BP: 113/74  Pulse: 98  Weight: 255 lb (115.7 kg)    Fetal Status:     Movement: Present     General:  Alert, oriented and cooperative. Patient is in no acute distress.  Skin: Skin is warm and dry. No rash noted.   Cardiovascular: Normal heart rate noted  Respiratory: Normal respiratory effort, no problems with respiration noted  Abdomen: Soft, gravid, appropriate for gestational age.  Pain/Pressure: Absent     Pelvic: Cervical exam deferred        Extremities: Normal range of motion.  Edema: None  Mental Status: Normal mood and affect. Normal behavior. Normal judgment and thought content.   Assessment and Plan:  Pregnancy: [id number] at [redacted]w[redacted]d  1. Type 2 diabetes mellitus complicating pregnancy in second trimester, antepartum FBS up to 120-140 and PP 140's to 248, increase dose of insulin and RTC soon - insulin NPH Human (HUMULIN N) 100 UNIT/ML injection; Take 20  units SQ qd every morning before breakfast and 14 units SQ qd at bedtime  Dispense: 10 mL; Refill: 11 - insulin regular (HUMULIN R) 100 units/mL injection; Take 12 units SQ qd before breakfast and 12 units SQ qd before dinner  Dispense: 10 mL; Refill: 11  2. Supervision of high risk pregnancy, antepartum F/u US scheduled  Preterm labor symptoms and general obstetric precautions including but not limited to vaginal bleeding, contractions, leaking of fluid and fetal movement were reviewed in detail with the patient. Please refer to After Visit Summary for other counseling recommendations.  Return in about 1 week (around 05/03/2018).  Future Appointments  Date Time Provider [place]  05/02/2018  1:45 PM Constant, Vickii Chafe, MD [place] None  05/09/2018  3:30 PM [place] 1 WH-MFCUS MFC-US  05/16/2018  3:30 PM [place] 1 WH-MFCUS MFC-US    Emeterio Reeve, MD

## 2018-04-26 NOTE — Patient Instructions (Signed)

## 2018-04-26 NOTE — Progress Notes (Signed)
No complaints  Pt has meter with her.

## 2018-05-02 ENCOUNTER — Ambulatory Visit (INDEPENDENT_AMBULATORY_CARE_PROVIDER_SITE_OTHER): Payer: Medicaid Other | Admitting: Obstetrics and Gynecology

## 2018-05-02 ENCOUNTER — Encounter: Payer: Self-pay | Admitting: Obstetrics and Gynecology

## 2018-05-02 VITALS — BP 101/70 | HR 105 | Wt 254.0 lb

## 2018-05-02 DIAGNOSIS — O24113 Pre-existing diabetes mellitus, type 2, in pregnancy, third trimester: Secondary | ICD-10-CM

## 2018-05-02 DIAGNOSIS — O26893 Other specified pregnancy related conditions, third trimester: Secondary | ICD-10-CM

## 2018-05-02 DIAGNOSIS — O24112 Pre-existing diabetes mellitus, type 2, in pregnancy, second trimester: Secondary | ICD-10-CM

## 2018-05-02 DIAGNOSIS — Z6791 Unspecified blood type, Rh negative: Secondary | ICD-10-CM

## 2018-05-02 DIAGNOSIS — O0990 Supervision of high risk pregnancy, unspecified, unspecified trimester: Secondary | ICD-10-CM

## 2018-05-02 DIAGNOSIS — O0993 Supervision of high risk pregnancy, unspecified, third trimester: Secondary | ICD-10-CM

## 2018-05-02 DIAGNOSIS — Z3A30 30 weeks gestation of pregnancy: Secondary | ICD-10-CM

## 2018-05-02 DIAGNOSIS — O26899 Other specified pregnancy related conditions, unspecified trimester: Secondary | ICD-10-CM

## 2018-05-02 MED ORDER — PANTOPRAZOLE SODIUM 20 MG PO TBEC: 20.0000 mg | DELAYED_RELEASE_TABLET | Freq: Two times a day (BID) | ORAL | 2 refills | Status: DC

## 2018-05-02 NOTE — Progress Notes (Signed)
   PRENATAL VISIT NOTE  Subjective:  Grace Montgomery is a 28 y.o. [phone] at [redacted]w[redacted]d being seen today for ongoing prenatal care.  She is currently monitored for the following issues for this high-risk pregnancy and has Major depressive disorder; Obesity; Seasonal allergies; Asthma, mild persistent; Rash and nonspecific skin eruption; MDD (major depressive disorder), recurrent severe, without psychosis ([place]); Type 2 diabetes mellitus complicating pregnancy in second trimester, antepartum; Supervision of high risk pregnancy, antepartum; Round ligament pain; and Rh negative state in antepartum period on their problem list.  Patient reports no complaints.  Contractions: Not present. Vag. Bleeding: None.  Movement: Present. Denies leaking of fluid.   The following portions of the patient's history were reviewed and updated as appropriate: allergies, current medications, past family history, past medical history, past social history, past surgical history and problem list. Problem list updated.  Objective:   Vitals:   05/02/18 1352  BP: 101/70  Pulse: (!) 105  Weight: 254 lb (115.2 kg)    Fetal Status: Fetal Heart Rate (bpm): 130   Movement: Present     General:  Alert, oriented and cooperative. Patient is in no acute distress.  Skin: Skin is warm and dry. No rash noted.   Cardiovascular: Normal heart rate noted  Respiratory: Normal respiratory effort, no problems with respiration noted  Abdomen: Soft, gravid, appropriate for gestational age.  Pain/Pressure: Absent     Pelvic: Cervical exam deferred        Extremities: Normal range of motion.     Mental Status: Normal mood and affect. Normal behavior. Normal judgment and thought content.   Assessment and Plan:  Pregnancy: [id number] at [redacted]w[redacted]d  1. Supervision of high risk pregnancy, antepartum Patient is doing well without complaints  2. Type 2 diabetes mellitus complicating pregnancy in second trimester, antepartum Patient did not bring  meter or log book She admits to eating a lot of carb (pasta and bread). 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 Rh negative state in antepartum period S/p rhogam  Preterm labor symptoms and general obstetric precautions including but not limited to vaginal bleeding, contractions, leaking of fluid and fetal movement were reviewed in detail with the patient. Please refer to After Visit Summary for other counseling recommendations.  No follow-ups on file.  Future Appointments  Date Time Provider [place]  05/09/2018  3:30 PM [place] 1 WH-MFCUS MFC-US  05/16/2018  3:30 PM [place] 1 WH-MFCUS MFC-US    Mora Bellman, MD

## 2018-05-09 ENCOUNTER — Encounter (HOSPITAL_COMMUNITY): Payer: Self-pay

## 2018-05-09 ENCOUNTER — Ambulatory Visit (HOSPITAL_COMMUNITY)
Admission: RE | Admit: 2018-05-09 | Discharge: 2018-05-09 | Disposition: A | Discharge status: Home / Self Care | Payer: Medicaid Other | Source: Ambulatory Visit | Attending: Advanced Practice Midwife | Admitting: Advanced Practice Midwife

## 2018-05-09 ENCOUNTER — Ambulatory Visit (HOSPITAL_COMMUNITY): Payer: Medicaid Other | Admitting: *Deleted

## 2018-05-09 VITALS — BP 117/71 | HR 111 | Wt 262.2 lb

## 2018-05-09 DIAGNOSIS — O09293 Supervision of pregnancy with other poor reproductive or obstetric history, third trimester: Secondary | ICD-10-CM

## 2018-05-09 DIAGNOSIS — Z362 Encounter for other antenatal screening follow-up: Secondary | ICD-10-CM

## 2018-05-09 DIAGNOSIS — O26899 Other specified pregnancy related conditions, unspecified trimester: Secondary | ICD-10-CM | POA: Diagnosis present

## 2018-05-09 DIAGNOSIS — O24313 Unspecified pre-existing diabetes mellitus in pregnancy, third trimester: Secondary | ICD-10-CM | POA: Diagnosis present

## 2018-05-09 DIAGNOSIS — Z794 Long term (current) use of insulin: Secondary | ICD-10-CM | POA: Diagnosis present

## 2018-05-09 DIAGNOSIS — IMO0001 Reserved for inherently not codable concepts without codable children: Secondary | ICD-10-CM

## 2018-05-09 DIAGNOSIS — O09213 Supervision of pregnancy with history of pre-term labor, third trimester: Secondary | ICD-10-CM

## 2018-05-09 DIAGNOSIS — J45909 Unspecified asthma, uncomplicated: Secondary | ICD-10-CM

## 2018-05-09 DIAGNOSIS — O24119 Pre-existing diabetes mellitus, type 2, in pregnancy, unspecified trimester: Secondary | ICD-10-CM | POA: Insufficient documentation

## 2018-05-09 DIAGNOSIS — O24112 Pre-existing diabetes mellitus, type 2, in pregnancy, second trimester: Secondary | ICD-10-CM

## 2018-05-09 DIAGNOSIS — Z6791 Unspecified blood type, Rh negative: Secondary | ICD-10-CM

## 2018-05-09 DIAGNOSIS — N949 Unspecified condition associated with female genital organs and menstrual cycle: Secondary | ICD-10-CM

## 2018-05-09 DIAGNOSIS — D259 Leiomyoma of uterus, unspecified: Secondary | ICD-10-CM

## 2018-05-09 DIAGNOSIS — O99333 Smoking (tobacco) complicating pregnancy, third trimester: Secondary | ICD-10-CM

## 2018-05-09 DIAGNOSIS — Z3A31 31 weeks gestation of pregnancy: Secondary | ICD-10-CM

## 2018-05-09 DIAGNOSIS — O3413 Maternal care for benign tumor of corpus uteri, third trimester: Secondary | ICD-10-CM

## 2018-05-09 DIAGNOSIS — O99213 Obesity complicating pregnancy, third trimester: Secondary | ICD-10-CM

## 2018-05-09 DIAGNOSIS — O9989 Other specified diseases and conditions complicating pregnancy, childbirth and the puerperium: Secondary | ICD-10-CM

## 2018-05-16 ENCOUNTER — Encounter (HOSPITAL_COMMUNITY): Payer: Self-pay | Admitting: Obstetrics and Gynecology

## 2018-05-16 ENCOUNTER — Other Ambulatory Visit: Payer: Self-pay

## 2018-05-16 ENCOUNTER — Inpatient Hospital Stay (HOSPITAL_COMMUNITY)
Admission: AD | Admit: 2018-05-16 | Discharge: 2018-05-24 | DRG: 833 | Disposition: A | Discharge status: Home / Self Care | Payer: Medicaid Other | Attending: Obstetrics and Gynecology | Admitting: Obstetrics and Gynecology

## 2018-05-16 ENCOUNTER — Ambulatory Visit (INDEPENDENT_AMBULATORY_CARE_PROVIDER_SITE_OTHER): Payer: Medicaid Other | Admitting: Obstetrics & Gynecology

## 2018-05-16 ENCOUNTER — Ambulatory Visit (HOSPITAL_COMMUNITY): Payer: Medicaid Other

## 2018-05-16 DIAGNOSIS — J45909 Unspecified asthma, uncomplicated: Secondary | ICD-10-CM | POA: Diagnosis not present

## 2018-05-16 DIAGNOSIS — O403XX0 Polyhydramnios, third trimester, not applicable or unspecified: Secondary | ICD-10-CM | POA: Diagnosis present

## 2018-05-16 DIAGNOSIS — O99333 Smoking (tobacco) complicating pregnancy, third trimester: Secondary | ICD-10-CM | POA: Diagnosis not present

## 2018-05-16 DIAGNOSIS — O09293 Supervision of pregnancy with other poor reproductive or obstetric history, third trimester: Secondary | ICD-10-CM | POA: Diagnosis not present

## 2018-05-16 DIAGNOSIS — O09213 Supervision of pregnancy with history of pre-term labor, third trimester: Secondary | ICD-10-CM | POA: Diagnosis not present

## 2018-05-16 DIAGNOSIS — O24113 Pre-existing diabetes mellitus, type 2, in pregnancy, third trimester: Secondary | ICD-10-CM | POA: Diagnosis present

## 2018-05-16 DIAGNOSIS — Z6791 Unspecified blood type, Rh negative: Secondary | ICD-10-CM

## 2018-05-16 DIAGNOSIS — E1165 Type 2 diabetes mellitus with hyperglycemia: Secondary | ICD-10-CM | POA: Diagnosis present

## 2018-05-16 DIAGNOSIS — O0992 Supervision of high risk pregnancy, unspecified, second trimester: Secondary | ICD-10-CM

## 2018-05-16 DIAGNOSIS — O26893 Other specified pregnancy related conditions, third trimester: Secondary | ICD-10-CM | POA: Diagnosis present

## 2018-05-16 DIAGNOSIS — O0990 Supervision of high risk pregnancy, unspecified, unspecified trimester: Secondary | ICD-10-CM

## 2018-05-16 DIAGNOSIS — Z87891 Personal history of nicotine dependence: Secondary | ICD-10-CM | POA: Diagnosis not present

## 2018-05-16 DIAGNOSIS — Z3A32 32 weeks gestation of pregnancy: Secondary | ICD-10-CM

## 2018-05-16 DIAGNOSIS — Z794 Long term (current) use of insulin: Secondary | ICD-10-CM

## 2018-05-16 DIAGNOSIS — O24112 Pre-existing diabetes mellitus, type 2, in pregnancy, second trimester: Secondary | ICD-10-CM | POA: Diagnosis not present

## 2018-05-16 DIAGNOSIS — O99513 Diseases of the respiratory system complicating pregnancy, third trimester: Secondary | ICD-10-CM | POA: Diagnosis present

## 2018-05-16 DIAGNOSIS — E119 Type 2 diabetes mellitus without complications: Secondary | ICD-10-CM | POA: Diagnosis present

## 2018-05-16 DIAGNOSIS — O99213 Obesity complicating pregnancy, third trimester: Secondary | ICD-10-CM | POA: Diagnosis present

## 2018-05-16 DIAGNOSIS — O99210 Obesity complicating pregnancy, unspecified trimester: Secondary | ICD-10-CM

## 2018-05-16 DIAGNOSIS — O26899 Other specified pregnancy related conditions, unspecified trimester: Secondary | ICD-10-CM

## 2018-05-16 DIAGNOSIS — O409XX0 Polyhydramnios, unspecified trimester, not applicable or unspecified: Secondary | ICD-10-CM

## 2018-05-16 DIAGNOSIS — J4530 Mild persistent asthma, uncomplicated: Secondary | ICD-10-CM | POA: Diagnosis present

## 2018-05-16 DIAGNOSIS — Z3A33 33 weeks gestation of pregnancy: Secondary | ICD-10-CM | POA: Diagnosis not present

## 2018-05-16 DIAGNOSIS — J45990 Exercise induced bronchospasm: Secondary | ICD-10-CM | POA: Diagnosis present

## 2018-05-16 DIAGNOSIS — O9989 Other specified diseases and conditions complicating pregnancy, childbirth and the puerperium: Secondary | ICD-10-CM | POA: Diagnosis not present

## 2018-05-16 HISTORY — DX: Other seasonal allergic rhinitis: J30.2

## 2018-05-16 HISTORY — DX: Mild persistent asthma, uncomplicated: J45.30

## 2018-05-16 LAB — CBC
HEMATOCRIT: 35.1 % — AB (ref 36.0–46.0)
Hemoglobin: 11.5 g/dL — ABNORMAL LOW (ref 12.0–15.0)
MCH: 31.2 pg (ref 26.0–34.0)
MCHC: 32.8 g/dL (ref 30.0–36.0)
MCV: 95.1 fL (ref 80.0–100.0)
Platelets: 272 10*3/uL (ref 150–400)
RBC: 3.69 MIL/uL — ABNORMAL LOW (ref 3.87–5.11)
RDW: 13.7 % (ref 11.5–15.5)
WBC: 8.6 10*3/uL (ref 4.0–10.5)
nRBC: 0 % (ref 0.0–0.2)

## 2018-05-16 LAB — GLUCOSE, CAPILLARY
Glucose-Capillary: 268 mg/dL — ABNORMAL HIGH (ref 70–99)
Glucose-Capillary: 211 mg/dL — ABNORMAL HIGH (ref 70–99)
Glucose-Capillary: 171 mg/dL — ABNORMAL HIGH (ref 70–99)

## 2018-05-16 LAB — POCT CBG (FASTING - GLUCOSE)-MANUAL ENTRY: Glucose Fasting, POC: 228 mg/dL — AB (ref 70–99)

## 2018-05-16 LAB — CREATININE, SERUM
Creatinine, Ser: 0.73 mg/dL (ref 0.44–1.00)
GFR calc Af Amer: >60 mL/min (ref >60)
GFR calc non Af Amer: >60 mL/min (ref >60)

## 2018-05-16 MED ORDER — INSULIN ASPART 100 UNIT/ML ~~LOC~~ SOLN
14.0000 [IU] | Freq: Three times a day (TID) | SUBCUTANEOUS | Status: DC
  Administered 2018-05-16 – 2018-05-19 (×7): 14 [IU] via SUBCUTANEOUS
  Administered 2018-05-19: 18 [IU] via SUBCUTANEOUS

## 2018-05-16 MED ORDER — PRENATAL MULTIVITAMIN CH
1.0000 | ORAL_TABLET | Freq: Every day | ORAL | Status: DC
  Administered 2018-05-17 – 2018-05-24 (×8): 1 via ORAL
  Filled 2018-05-16 (×8): qty 1

## 2018-05-16 MED ORDER — INSULIN REGULAR HUMAN 100 UNIT/ML IJ SOLN: INTRAMUSCULAR | 11 refills | Status: DC

## 2018-05-16 MED ORDER — ENOXAPARIN SODIUM 60 MG/0.6ML ~~LOC~~ SOLN
60.0000 mg | SUBCUTANEOUS | Status: DC
  Administered 2018-05-16: 60 mg via SUBCUTANEOUS
  Filled 2018-05-16 (×4): qty 0.6

## 2018-05-16 MED ORDER — INSULIN NPH (HUMAN) (ISOPHANE) 100 UNIT/ML ~~LOC~~ SUSP: SUBCUTANEOUS | 11 refills | Status: DC

## 2018-05-16 MED ORDER — ACETAMINOPHEN 325 MG PO TABS
650.0000 mg | ORAL_TABLET | ORAL | Status: DC | PRN
  Administered 2018-05-16 – 2018-05-23 (×4): 650 mg via ORAL
  Filled 2018-05-16 (×4): qty 2

## 2018-05-16 MED ORDER — CALCIUM CARBONATE ANTACID 500 MG PO CHEW
2.0000 | CHEWABLE_TABLET | ORAL | Status: DC | PRN
  Administered 2018-05-18 – 2018-05-23 (×8): 400 mg via ORAL
  Filled 2018-05-16 (×7): qty 2

## 2018-05-16 MED ORDER — INSULIN NPH (HUMAN) (ISOPHANE) 100 UNIT/ML ~~LOC~~ SUSP
20.0000 [IU] | Freq: Every day | SUBCUTANEOUS | Status: DC
  Administered 2018-05-16 – 2018-05-17 (×2): 20 [IU] via SUBCUTANEOUS
  Filled 2018-05-16: qty 10

## 2018-05-16 MED ORDER — INSULIN NPH (HUMAN) (ISOPHANE) 100 UNIT/ML ~~LOC~~ SUSP
24.0000 [IU] | Freq: Every day | SUBCUTANEOUS | Status: DC
  Administered 2018-05-17 – 2018-05-18 (×2): 24 [IU] via SUBCUTANEOUS
  Administered 2018-05-19: 26 [IU] via SUBCUTANEOUS

## 2018-05-16 MED ORDER — INSULIN ASPART 100 UNIT/ML ~~LOC~~ SOLN
5.0000 [IU] | Freq: Once | SUBCUTANEOUS | Status: AC
  Administered 2018-05-16: 5 [IU] via SUBCUTANEOUS

## 2018-05-16 MED ORDER — DOCUSATE SODIUM 100 MG PO CAPS
100.0000 mg | ORAL_CAPSULE | Freq: Every day | ORAL | Status: DC
  Administered 2018-05-17 – 2018-05-24 (×8): 100 mg via ORAL
  Filled 2018-05-16 (×8): qty 1

## 2018-05-16 NOTE — Progress Notes (Signed)
   PRENATAL VISIT NOTE  Subjective:  Grace Montgomery is a 28 y.o. [phone] at [redacted]w[redacted]d being seen today for ongoing prenatal care.  She is currently monitored for the following issues for this high-risk pregnancy and has Major depressive disorder; Obesity; Seasonal allergies; Asthma, mild persistent; Rash and nonspecific skin eruption; MDD (major depressive disorder), recurrent severe, without psychosis ([place]); Type 2 diabetes mellitus complicating pregnancy in second trimester, antepartum; Supervision of high risk pregnancy, antepartum; Round ligament pain; and Rh negative state in antepartum period on their problem list.  Patient reports BG poorly controlled, has tried adjusting her insulin herself.  Contractions: Not present. Vag. Bleeding: None.  Movement: Present. Denies leaking of fluid.   The following portions of the patient's history were reviewed and updated as appropriate: allergies, current medications, past family history, past medical history, past social history, past surgical history and problem list.   Objective:   Vitals:   05/16/18 1354  BP: 116/77  Pulse: (!) 109  Weight: 118.9 kg    Fetal Status: Fetal Heart Rate (bpm): 130   Movement: Present     General:  Alert, oriented and cooperative. Patient is in no acute distress.  Skin: Skin is warm and dry. No rash noted.   Cardiovascular: Normal heart rate noted  Respiratory: Normal respiratory effort, no problems with respiration noted  Abdomen: Soft, gravid, appropriate for gestational age.  Pain/Pressure: Present     Pelvic: Cervical exam deferred        Extremities: Normal range of motion.  Edema: Trace  Mental Status: Normal mood and affect. Normal behavior. Normal judgment and thought content.   Assessment and Plan:  Pregnancy: [id number] at [redacted]w[redacted]d 1. Supervision of high risk pregnancy, antepartum FBS above 200 and PP above 300 reported - POCT CBG (Fasting - Glucose)  2. Type 2 diabetes mellitus complicating pregnancy  in second trimester, antepartum Poor control , will admit for adjustment inpatient  - insulin NPH Human (HUMULIN N) 100 UNIT/ML injection; Take 24 units SQ qd every morning before breakfast and 20 units SQ qd at bedtime  Dispense: 10 mL; Refill: 11 - insulin regular (HUMULIN R) 100 units/mL injection; Take 14 units SQ qd before breakfast and 14 units SQ qd before dinner  Dispense: 10 mL; Refill: 11  Preterm labor symptoms and general obstetric precautions including but not limited to vaginal bleeding, contractions, leaking of fluid and fetal movement were reviewed in detail with the patient. Please refer to After Visit Summary for other counseling recommendations.   Return in about 2 weeks (around 05/30/2018).  Future Appointments  Date Time Provider [place]  05/30/2018  1:30 PM Constant, Vickii Chafe, MD [facility] None    Emeterio Reeve, MD
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
## 2018-05-16 NOTE — Plan of Care (Signed)
Patient admitted to 75

## 2018-05-16 NOTE — Patient Instructions (Signed)
Type 1 or Type 2 Diabetes Mellitus During Pregnancy, Diagnosis °Type 1 diabetes (type 1 diabetes mellitus) and type 2 diabetes (type 2 diabetes mellitus) are long-term (chronic) diseases. Your diabetes may be caused by one or both of these problems: °· Your pancreas does not make enough of a hormone called insulin. °· Your pancreas does not respond in a normal way to insulin that it makes. °Insulin lets sugars (glucose) go into cells in the body. This gives you energy. If you have diabetes, sugars cannot get into cells. This causes high blood sugar (hyperglycemia). °If diabetes is treated, it may not hurt you or your baby. Your doctor will set treatment goals for you. In general, you should have these blood sugar levels: °· After not eating for a long time (fasting): 95 mg/dL (5.3 mmol/L). °· After meals (postprandial): °? One hour after a meal: at or below 140 mg/dL (7.8 mmol/L). °? Two hours after a meal: at or below 120 mg/dL (6.7 mmol/L). °· A1c (hemoglobin A1c) level: 6-6.5%. °Follow these instructions at home: °Questions to ask your doctor °· You may want to ask these questions: °? Do I need to meet with a diabetes educator? °? Where can I find a support group for people with diabetes? °? What equipment will I need to care for myself at home? °? What medicines do I need? When should I take them? °? How often do I need to check my blood sugar? °? What number can I call if I have questions? °? When is my next doctor's visit? °General instructions °· Take over-the-counter and prescription medicines only as told by your doctor. °· Stay at a healthy weight during pregnancy. °· Keep all follow-up visits as told by your doctor. This is important. °Contact a doctor if: °· Your blood sugar is at or above 240 mg/dL (13.3 mmol/L). °· Your blood sugar is at or above 200 mg/dL (11.1 mmol/L), and you have ketones in your pee (urine). °· You have been sick or have had a fever for 2 days or more and you are not getting  better. °· You have any of these problems for more than 6 hours: °? You cannot eat or drink. °? You feel sick to your stomach (nauseous). °? You throw up (vomit). °? You have watery poop (diarrhea). °Get help right away if: °· Your blood sugar is lower than 54 mg/dL (3 mmol/L). °· You get confused. °· You have trouble: °? Thinking clearly. °? Breathing. °· Your baby moves less than normal. °· You have: °? Moderate or large ketone levels in your pee. °? Vaginal bleeding. °? Unusual fluid coming from your vagina. °? Early contractions. These may feel like tightness in your belly. °Summary °· Type 1 diabetes (type 1 diabetes mellitus) and type 2 diabetes (type 2 diabetes mellitus) are long-term (chronic) diseases. °· If diabetes is treated, it may not hurt you or your baby. °· Your doctor will set treatment goals for you. °This information is not intended to replace advice given to you by your health care provider. Make sure you discuss any questions you have with your health care provider. °Document Released: 06/16/2015 Document Revised: 10/18/2016 Document Reviewed: 03/28/2015 °Elsevier Interactive Patient Education © 2019 [employer] ° °

## 2018-05-16 NOTE — H&P (Addendum)
Grace Montgomery is a 28 y.o. female [phone] at [redacted]w[redacted]d presenting for inpatient management of poorly controlled type 2 diabetes. Patient seen in the office today reporting CBG values consistently in the 200 range for fasting and 300 for postprandial. [facility] and admits to poor dietary control. Patient with prenatal care [facility] complicated by type 2 diabetes and 3 previous iatrogenic preterm deliveries at 36 weeks due to poorly controlled diabetes. Patient reports good fetal movement. She denies regular contractions, leakage of fluid or vaginal bleeding  OB History    Gravida  5   Para  3   Term  0   Preterm  3   AB  1   Living  3     SAB  1   TAB  0   Ectopic  0   Multiple  0   Live Births  3          Past Medical History:  Diagnosis Date  . Anxiety   . Asthma   . Bipolar disorder ([place])   . Cannabis dependence   . Diabetes mellitus without complication ([place])   . 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 NO PAST SURGERIES     Family History: family history includes Depression in her mother; Diabetes in her mother and another family member; Sickle cell anemia in her mother; Stroke in an other family member. Social History:  reports that she has quit smoking. Her smoking use included cigarettes. She smoked 0.25 packs per day. She has never used smokeless tobacco. She reports previous alcohol use. She reports previous drug use.     Maternal Diabetes: Yes:  Diabetes Type:  Insulin/Medication controlled Genetic Screening: Normal Maternal Ultrasounds/Referrals: Normal Fetal Ultrasounds or other Referrals:  Fetal echo Maternal Substance Abuse:  No Significant Maternal Medications:  None Significant Maternal Lab Results:  None Other Comments:  None  ROS  See pertinent in HPI History   Blood pressure 115/66, pulse (!) 108, temperature 98.3 F (36.8  C), temperature source Oral, resp. rate 18, last menstrual period 10/02/2017, SpO2 100 %. Exam Physical Exam  GENERAL: Well-developed, well-nourished female in no acute distress.  LUNGS: Clear to auscultation bilaterally.  HEART: Regular rate and rhythm. ABDOMEN: Soft, nontender, gravid PELVIC: Not indicated EXTREMITIES: No cyanosis, clubbing, or edema, 2+ distal pulses.  Prenatal labs: ABO, Rh: A/Negative/-- (11/07 1343) Antibody: Negative (11/07 1343) Rubella: 1.89 (11/07 1343) RPR: Non Reactive (02/12 1619)  HBsAg: Negative (11/07 1343)  HIV: Non Reactive (02/12 1619)  GBS:     [place] Mfm Ob Follow Up  Result Date: 05/09/2018 ----------------------------------------------------------------------  OBSTETRICS REPORT                         (Signed Final 05/09/2018 05:00 pm) ---------------------------------------------------------------------- Patient Info  ID #:        [id number]                          D.O.B.:  [birth year] (27 yrs)  Name:        Grace Montgomery                Visit Date: 05/09/2018 03:26 pm ---------------------------------------------------------------------- Performed By  Performed By:      Valda Favia          Ref. Address:  [place]  Attending:         Sander Nephew      [facility]                     MD                                        [facility]  Referred By:       Jorje Guild                     CNM ---------------------------------------------------------------------- Orders   #  Description                           Code         Ordered By   1  [place] MFM OB FOLLOW UP                   78676.72     Sander Nephew  ----------------------------------------------------------------------   #  Order #                     Accession #                 Episode #   1  [id number]                   [id number]                  [id number]   ---------------------------------------------------------------------- Indications   Pre-existing diabetes, type 2, in pregnancy,    O24.112   second trimester (Insulin)   Poor obstetric history (prior pre-term delivery)O09.219   (36w x2--Induced for poor glycemic control)   Asthma (Inhaler)                                O99.89 j45.909   Poor obstetric history: Previous gestational    O09.299   diabetes   Obesity complicating pregnancy, second          O99.212   trimester   Smoking complicating pregnancy, second          O99.332   trimester (0.25 pk/day)   Uterine fibroids affecting pregnancy in second  O34.12, D25.9   trimester, antepartum   Encounter for other antenatal screening         Z36.2   follow-up   [redacted] weeks gestation of pregnancy                 Z3A.31  ---------------------------------------------------------------------- Vital Signs                                                  Height:  5'3" ---------------------------------------------------------------------- Fetal Evaluation  Num Of Fetuses:          1  Fetal Heart Rate(bpm):   126  Cardiac Activity:        Observed  Presentation:            Cephalic  Placenta:                Posterior  P. Cord Insertion:       Previously seen as normal  Amniotic Fluid  AFI FV:      Within normal limits  AFI Sum(cm)     %Tile       Largest Pocket(cm)  15.38           55          5.06  RUQ(cm)       RLQ(cm)        LUQ(cm)        LLQ(cm)  5.06          4.85           3.34           2.13 ---------------------------------------------------------------------- Biometry  BPD:      77.3   mm     G. Age:  31w 0d         31  %    CI:          73.4  %    70 - 86                                                           FL/HC:       20.5  %    19.3 - 21.3  HC:      286.7   mm     G. Age:  31w 3d         21  %    HC/AC:       1.03       0.96 - 1.17  AC:      277.6   mm     G. Age:  31w 6d         63  %    FL/BPD:      76.1  %    71 - 87  FL:       58.8   mm     G. Age:   30w 5d         22  %    FL/AC:       21.2  %    20 - 24  HUM:      51.8   mm     G. Age:  30w 2d         30  %  Est. FW:    1754   gm    3 lb 14 oz      57  % ---------------------------------------------------------------------- OB History  Gravidity:     4         Term:  1          Prem:  2  Living:        2 ---------------------------------------------------------------------- Gestational Age  LMP:            31w 2d       Date:  10/02/17                   EDD:  07/09/18  U/S Today:      31w 2d                                         EDD:  07/09/18  Best:           31w 2d    Det. By:  LMP  (10/02/17)            EDD:  07/09/18 ---------------------------------------------------------------------- Anatomy  Cranium:                Appears normal         Aortic Arch:            Previously seen  Cavum:                  Previously seen        Ductal Arch:            Previously seen  Ventricles:             Appears normal         Diaphragm:              Previously seen  Choroid Plexus:         Previously seen        Stomach:                Appears normal, left                                                                         sided  Cerebellum:             Previously seen        Abdomen:                Previously seen  Posterior Fossa:        Previously seen        Abdominal Wall:         Previously seen  Nuchal Fold:            Previously seen        Cord Vessels:           Previously seen  Face:                   Orbits and profile     Kidneys:                Appear normal                          previously seen  Lips:                   Previously seen        Bladder:                Appears normal  Thoracic:               Appears normal  Spine:                  Previously seen  Heart:                  Previously seen        Upper Extremities:      Previously seen  RVOT:                   Previously seen        Lower Extremities:      Previously seen  LVOT:                   Previously seen  Other:   Female  gender. Heels and 5th digit previously visualized. Open hands           previously visualized. Nasal bone previously visualized. ---------------------------------------------------------------------- Cervix Uterus Adnexa  Cervix  Not visualized (advanced GA >24wks) ---------------------------------------------------------------------- Impression  Normal interval growth.  BPP 8/8 ---------------------------------------------------------------------- Recommendations  Follow up weekly testing given T2DM previously scheduled. ----------------------------------------------------------------------               Sander Nephew, MD Electronically Signed Final Report   05/09/2018 05:00 pm ----------------------------------------------------------------------    Assessment/Plan: 28 yo [id number] at [redacted]w[redacted]d with poorly controlled type 2 diabetes - Admit for diabetes management - Will keep on current insulin regimen and adjust dosage accordingly - Normal fetal growth on 05/09/18   Anjolaoluwa Siguenza 05/16/2018, 4:00 PM

## 2018-05-17 ENCOUNTER — Inpatient Hospital Stay (HOSPITAL_COMMUNITY): Payer: Medicaid Other

## 2018-05-17 DIAGNOSIS — O99333 Smoking (tobacco) complicating pregnancy, third trimester: Secondary | ICD-10-CM

## 2018-05-17 DIAGNOSIS — O09213 Supervision of pregnancy with history of pre-term labor, third trimester: Secondary | ICD-10-CM

## 2018-05-17 DIAGNOSIS — O24113 Pre-existing diabetes mellitus, type 2, in pregnancy, third trimester: Secondary | ICD-10-CM

## 2018-05-17 DIAGNOSIS — J45909 Unspecified asthma, uncomplicated: Secondary | ICD-10-CM

## 2018-05-17 DIAGNOSIS — Z3A32 32 weeks gestation of pregnancy: Secondary | ICD-10-CM

## 2018-05-17 DIAGNOSIS — O9989 Other specified diseases and conditions complicating pregnancy, childbirth and the puerperium: Secondary | ICD-10-CM

## 2018-05-17 DIAGNOSIS — O09293 Supervision of pregnancy with other poor reproductive or obstetric history, third trimester: Secondary | ICD-10-CM

## 2018-05-17 DIAGNOSIS — O99213 Obesity complicating pregnancy, third trimester: Secondary | ICD-10-CM

## 2018-05-17 LAB — GLUCOSE, CAPILLARY
Glucose-Capillary: 130 mg/dL — ABNORMAL HIGH (ref 70–99)
Glucose-Capillary: 134 mg/dL — ABNORMAL HIGH (ref 70–99)
Glucose-Capillary: 222 mg/dL — ABNORMAL HIGH (ref 70–99)
Glucose-Capillary: 76 mg/dL (ref 70–99)
Glucose-Capillary: 189 mg/dL — ABNORMAL HIGH (ref 70–99)
Glucose-Capillary: 243 mg/dL — ABNORMAL HIGH (ref 70–99)

## 2018-05-17 LAB — RPR: RPR Ser Ql: NONREACTIVE

## 2018-05-17 MED ORDER — INSULIN ASPART 100 UNIT/ML ~~LOC~~ SOLN
0.0000 [IU] | Freq: Four times a day (QID) | SUBCUTANEOUS | Status: DC
  Administered 2018-05-17: 5 [IU] via SUBCUTANEOUS
  Administered 2018-05-17: 8 [IU] via SUBCUTANEOUS
  Administered 2018-05-17: 14 [IU] via SUBCUTANEOUS
  Administered 2018-05-18 (×2): 12 [IU] via SUBCUTANEOUS
  Administered 2018-05-18: 8 [IU] via SUBCUTANEOUS
  Administered 2018-05-19 (×2): 12 [IU] via SUBCUTANEOUS
  Administered 2018-05-19: 5 [IU] via SUBCUTANEOUS
  Administered 2018-05-20: 12 [IU] via SUBCUTANEOUS
  Administered 2018-05-20: 5 [IU] via SUBCUTANEOUS
  Administered 2018-05-20: 8 [IU] via SUBCUTANEOUS
  Administered 2018-05-21: 12 [IU] via SUBCUTANEOUS
  Administered 2018-05-21 – 2018-05-22 (×4): 8 [IU] via SUBCUTANEOUS
  Administered 2018-05-22: 12 [IU] via SUBCUTANEOUS
  Administered 2018-05-23: 5 [IU] via SUBCUTANEOUS

## 2018-05-17 NOTE — Progress Notes (Signed)
Nurse called into room stating she felt like her blood sugar was low. Blood sugar taken and was 243. Nurse educated pt on nutrition and to only eat the snacks provided by the cafeteria. Patient understood.

## 2018-05-17 NOTE — Progress Notes (Signed)
   Review of Glycemic Control  Diabetes history: DM2 Outpatient Diabetes medications: Humulin N 24 units am + 20 units hs + Humulin R 14 units bid ac meals Current orders for Inpatient glycemic control: Novolin N 24 units ac breakfast + 20 units hs + Novolog 14 units tid meal coverage if eats 50%  Inpatient Diabetes Program Recommendations:   Received consult. Based on patient weight 118.8 kg would recommend Diabetic pregnant order set based on [redacted] weeks gestation with total daily dose of 104 units. Patient will receive meal coverage as ordered and add correction scale based on 2 hrs postprandial.  Will follow while hospitalized.  Thank you, Nani Gasser. Jahdiel Krol, RN, MSN, CDE  Diabetes Coordinator Inpatient Glycemic Control Team Team Pager [phone] (8am-5pm) 05/17/2018 10:37 AM

## 2018-05-17 NOTE — Progress Notes (Signed)
Patient ID: Grace Montgomery, female   DOB: 18-Apr-1990, 28 y.o.   MRN: [id number] [place]) NOTE  Grace Montgomery is a 28 y.o. [phone] at [redacted]w[redacted]d  who is admitted for poorly controlled type 2 diabetes.    Fetal presentation is cephalic. Length of Stay:  1  Days  Date of admission:05/16/2018  Subjective: Patient reports feeling well and reports occasional lower pelvic pressure Patient reports the fetal movement as active. Patient reports uterine contraction  activity as irregular. Patient reports  vaginal bleeding as none. Patient describes fluid per vagina as None.  Vitals:  Blood pressure 129/85, pulse 99, temperature 97.6 F (36.4 C), temperature source Oral, resp. rate 18, height 5\' 3"  (1.6 m), weight 118.8 kg, last menstrual period 10/02/2017, SpO2 99 %. Vitals:   05/16/18 1546 05/16/18 1614 05/16/18 1935 05/16/18 2155  BP: 115/66  (!) 114/54 129/85  Pulse: (!) 108  98 99  Resp: 18   18  Temp: 98.3 F (36.8 C)  98.9 F (37.2 C) 97.6 F (36.4 C)  TempSrc: Oral  Oral Oral  SpO2: 100%  99% 99%  Weight:  118.8 kg    Height:  5\' 3"  (1.6 m)     Physical Examination: GENERAL: Well-developed, well-nourished female in no acute distress.  LUNGS: Clear to auscultation bilaterally.  HEART: Regular rate and rhythm. ABDOMEN: Soft, nontender, gravid PELVIC: Not indicated EXTREMITIES: No cyanosis, clubbing, or edema, 2+ distal pulses.   Fetal Monitoring:  Baseline: 120 bpm, Variability: Good {> 6 bpm), Accelerations: Reactive and Decelerations: Absent   Reactive TOCO: no contractions  Labs:  Results for orders placed or performed during the hospital encounter of 05/16/18 (from the past 24 hour(s))  CBC   Collection Time: 05/16/18  4:25 PM  Result Value Ref Range   WBC 8.6 4.0 - 10.5 K/uL   RBC 3.69 (L) 3.87 - 5.11 MIL/uL   Hemoglobin 11.5 (L) 12.0 - 15.0 g/dL   HCT 35.1 (L) 36.0 - 46.0 %   MCV 95.1 80.0 - 100.0 fL   MCH 31.2 26.0 - 34.0 pg   MCHC  32.8 30.0 - 36.0 g/dL   RDW 13.7 11.5 - 15.5 %   Platelets 272 150 - 400 K/uL   nRBC 0.0 0.0 - 0.2 %  Creatinine, serum   Collection Time: 05/16/18  4:25 PM  Result Value Ref Range   Creatinine, Ser 0.73 0.44 - 1.00 mg/dL   GFR calc non Af Amer >60 >60 mL/min   GFR calc Af Amer >60 >60 mL/min  RPR   Collection Time: 05/16/18  4:25 PM  Result Value Ref Range   RPR Ser Ql Non Reactive Non Reactive  Type and screen [place]   Collection Time: 05/16/18  4:28 PM  Result Value Ref Range   ABO/RH(D) A NEG    Antibody Screen POS    Sample Expiration 05/19/2018    Antibody Identification      PASSIVELY ACQUIRED ANTI-D Performed at [facility], [facility] [address], [place], [place] [address]    Unit Number [id number]    Blood Component Type RED CELLS,LR    Unit division 00    Status of Unit ALLOCATED    Transfusion Status OK TO TRANSFUSE    Crossmatch Result COMPATIBLE    Unit Number [id number]    Blood Component Type RED CELLS,LR    Unit division 00    Status of Unit ALLOCATED    Transfusion Status OK TO TRANSFUSE  Crossmatch Result COMPATIBLE   BPAM RBC   Collection Time: 05/16/18  4:28 PM  Result Value Ref Range   Blood Product Unit Number [id number]    Unit Type and Rh 0600    Blood Product Expiration Date [id number]    Blood Product Unit Number [id number]    Unit Type and Rh 0600    Blood Product Expiration Date [id number]   Glucose, capillary   Collection Time: 05/16/18  5:04 PM  Result Value Ref Range   Glucose-Capillary 268 (H) 70 - 99 mg/dL  Glucose, capillary   Collection Time: 05/16/18  8:17 PM  Result Value Ref Range   Glucose-Capillary 211 (H) 70 - 99 mg/dL  Glucose, capillary   Collection Time: 05/16/18 10:21 PM  Result Value Ref Range   Glucose-Capillary 171 (H) 70 - 99 mg/dL  Results for orders placed or performed in visit on 05/16/18 (from the past 24 hour(s))  POCT CBG (Fasting - Glucose)   Collection Time:  05/16/18  2:14 PM  Result Value Ref Range   Glucose Fasting, POC 228 (A) 70 - 99 mg/dL    Imaging Studies:      Medications:  Scheduled . docusate sodium  100 mg Oral Daily  . enoxaparin (LOVENOX) injection  60 mg Subcutaneous Q24H  . insulin aspart  14 Units Subcutaneous TID WC  . insulin NPH Human  20 Units Subcutaneous QHS  . insulin NPH Human  24 Units Subcutaneous QAC breakfast  . prenatal multivitamin  1 tablet Oral Q1200   I have reviewed the patient's current medications.  ASSESSMENT: [id number] [redacted]w[redacted]d Estimated Date of Delivery: 07/09/18  Patient Active Problem List   Diagnosis Date Noted  . Poorly controlled type 2 diabetes mellitus ([place]) 05/16/2018  . Rh negative state in antepartum period 03/02/2018  . Round ligament pain 02/02/2018  . Supervision of high risk pregnancy, antepartum 01/12/2018  . Type 2 diabetes mellitus complicating pregnancy in second trimester, antepartum 01/05/2018  . MDD (major depressive disorder), recurrent severe, without psychosis ([place]) 02/16/2017  . Rash and nonspecific skin eruption 08/22/2012  . Obesity 06/29/2011  . Seasonal allergies 06/29/2011  . Asthma, mild persistent 06/29/2011  . Major depressive disorder 04/26/2011    Class: Acute    PLAN: GDM- all values out of range will increase NPH 26 in am and 22 in pm Will place a consult for diabetic educator BPP ordered today as patient missed yesterday's appointment Continue current inpatient care   Emily Massar 05/17/2018,8:23 AM

## 2018-05-17 NOTE — Progress Notes (Signed)
Initial Nutrition Assessment  DOCUMENTATION CODES:   Obesity unspecified  INTERVENTION:  Carbohydrate modified gestational diet/ double protein portions Copy of GDM diet instruction provided to pt  NUTRITION DIAGNOSIS:   Altered nutrition lab value related to other (see comment)(pregnancy, questionable complance to therapy) as evidenced by (hyperglycemia).  GOAL:  Other (Comment)(better glucose control)   MONITOR:  Labs  REASON FOR ASSESSMENT:   Antenatal, Gestational Diabetes    ASSESSMENT:   32 3/7 weeks, adm with hyperglycemia/ DM II. Pre-preg weight approx 210 lbs, BMI 37.3.  51 lb weight gain. Excessive weight gain  Pt likely non-compliant to diet at home. Loves pasta. Does not count CHO's. Reviewed basic of GDM diet with pt. Stressed what foods were high in CHO's.  Diet Order:   Diet Order            Diet gestational carb mod Fluid consistency: Thin; Room service appropriate? Yes  Diet effective now              EDUCATION NEEDS:   Education needs have been addressed 01/26/18 as outpt  Skin:  Skin Assessment: Reviewed RN Assessment  Height:   Ht Readings from Last 1 Encounters:  05/16/18 5\' 3"  (1.6 m)    Weight:   Wt Readings from Last 1 Encounters:  05/16/18 118.8 kg    Ideal Body Weight:     BMI:  Body mass index is 46.41 kg/m.  Estimated Nutritional Needs:   Kcal:  2100-2300  Protein:  95-105 g  Fluid:  2.4 L    Weyman Rodney M.Fredderick Severance LDN Neonatal Nutrition Support Specialist/RD III Pager [phone]      Phone [phone]

## 2018-05-18 ENCOUNTER — Encounter (HOSPITAL_COMMUNITY): Payer: Self-pay | Admitting: Obstetrics & Gynecology

## 2018-05-18 LAB — GLUCOSE, CAPILLARY
GLUCOSE-CAPILLARY: 75 mg/dL (ref 70–99)
GLUCOSE-CAPILLARY: 171 mg/dL — AB (ref 70–99)
Glucose-Capillary: 130 mg/dL — ABNORMAL HIGH (ref 70–99)
Glucose-Capillary: 186 mg/dL — ABNORMAL HIGH (ref 70–99)
Glucose-Capillary: 101 mg/dL — ABNORMAL HIGH (ref 70–99)
Glucose-Capillary: 219 mg/dL — ABNORMAL HIGH (ref 70–99)
Glucose-Capillary: 231 mg/dL — ABNORMAL HIGH (ref 70–99)
Glucose-Capillary: 217 mg/dL — ABNORMAL HIGH (ref 70–99)

## 2018-05-18 MED ORDER — INSULIN NPH (HUMAN) (ISOPHANE) 100 UNIT/ML ~~LOC~~ SUSP
24.0000 [IU] | Freq: Every day | SUBCUTANEOUS | Status: DC
  Administered 2018-05-18: 24 [IU] via SUBCUTANEOUS

## 2018-05-18 MED ORDER — ZOLPIDEM TARTRATE 5 MG PO TABS
5.0000 mg | ORAL_TABLET | Freq: Every evening | ORAL | Status: DC | PRN
  Administered 2018-05-18 – 2018-05-23 (×6): 5 mg via ORAL
  Filled 2018-05-18 (×6): qty 1

## 2018-05-18 MED ORDER — INSULIN NPH (HUMAN) (ISOPHANE) 100 UNIT/ML ~~LOC~~ SUSP: 22.0000 [IU] | Freq: Every day | SUBCUTANEOUS | Status: DC

## 2018-05-18 NOTE — Progress Notes (Signed)
Patient ID: Grace Montgomery, female   DOB: April 02, 1990, 28 y.o.   MRN: [id number] [place]) NOTE  Grace Montgomery is a 28 y.o. [phone] at [redacted]w[redacted]d  who is admitted for poorly controlled type 2 diabetes.    Fetal presentation is cephalic. Length of Stay:  2  Days  Date of admission:05/16/2018  Subjective: Patient reports feeling well and reports occasional lower pelvic pressure unchanged since admission Patient reports the fetal movement as active. Patient reports uterine contraction  activity as irregular. Patient reports  vaginal bleeding as none. Patient describes fluid per vagina as None.  Vitals:  Blood pressure (!) 93/49, pulse 93, temperature 98.1 F (36.7 C), temperature source Oral, resp. rate 18, height 5\' 3"  (1.6 m), weight 118.8 kg, last menstrual period 10/02/2017, SpO2 99 %. Vitals:   05/17/18 1935 05/18/18 0620 05/18/18 0830 05/18/18 1208  BP: 124/74 (!) 108/53 114/60 (!) 93/49  Pulse: 91 86 (!) 103 93  Resp: 18 18 20 18   Temp: 98.5 F (36.9 C) 97.8 F (36.6 C) 98.1 F (36.7 C) 98.1 F (36.7 C)  TempSrc: Oral Oral Oral Oral  SpO2: 100% 98% 100% 99%  Weight:      Height:       Physical Examination: GENERAL: Well-developed, well-nourished female in no acute distress.  LUNGS: Clear to auscultation bilaterally.  HEART: Regular rate and rhythm. ABDOMEN: Soft, nontender, gravid PELVIC: Not indicated EXTREMITIES: No cyanosis, clubbing, or edema, 2+ distal pulses.   Fetal Monitoring:  Baseline: 125 bpm, Variability: Good {> 6 bpm), Accelerations: Reactive and Decelerations: Absent   Reactive TOCO: no contractions  Labs:  Results for orders placed or performed during the hospital encounter of 05/16/18 (from the past 24 hour(s))  Glucose, capillary   Collection Time: 05/17/18  3:08 PM  Result Value Ref Range   Glucose-Capillary 222 (H) 70 - 99 mg/dL  Glucose, capillary   Collection Time: 05/17/18  7:32 PM  Result Value Ref Range    Glucose-Capillary 76 70 - 99 mg/dL   Comment 1 Notify RN   Glucose, capillary   Collection Time: 05/17/18  9:39 PM  Result Value Ref Range   Glucose-Capillary 189 (H) 70 - 99 mg/dL   Comment 1 Notify RN   Glucose, capillary   Collection Time: 05/17/18 11:03 PM  Result Value Ref Range   Glucose-Capillary 243 (H) 70 - 99 mg/dL  Glucose, capillary   Collection Time: 05/18/18  8:39 AM  Result Value Ref Range   Glucose-Capillary 130 (H) 70 - 99 mg/dL  Glucose, capillary   Collection Time: 05/18/18 10:49 AM  Result Value Ref Range   Glucose-Capillary 186 (H) 70 - 99 mg/dL    Imaging Studies:    [place] Mfm Fetal Bpp Wo Non Stress  Result Date: 05/17/2018 ----------------------------------------------------------------------  OBSTETRICS REPORT                       (Signed Final 05/17/2018 01:02 pm) ---------------------------------------------------------------------- Patient Info  ID #:       [id number]                          D.O.B.:  1990-09-03 (28 yrs)  Name:       Grace Montgomery                Visit Date: 05/17/2018 10:28 am ---------------------------------------------------------------------- Performed By  Performed By:     Hubert Azure  Ref. Address:     [place]  Attending:        Tama High MD        [facility]                                                             [place]  Referred By:      Jorje Guild                    CNM ---------------------------------------------------------------------- Orders   #  Description                          Code         Ordered By   1  [place] MFM FETAL BPP WO NON              76819.01     [place]  ----------------------------------------------------------------------   #  Order #                    Accession #                 Episode #   1  [id number]                  [id number]                   [id number]  ---------------------------------------------------------------------- Indications   Poor obstetric history (prior pre-term         O09.219   delivery) (36w x2--Induced for poor glycemic   control)   Asthma (Inhaler)                               O99.89 j45.909   Poor obstetric history: Previous gestational   O09.299   diabetes   Obesity complicating pregnancy, third          O99.213   trimester   Pre-existing diabetes, type 2, in pregnancy,   O24.113   third trimester   Smoking complicating pregnancy, third          O99.333   trimester   [redacted] weeks gestation of pregnancy                Z3A.32  ---------------------------------------------------------------------- Vital Signs                                                 Height:        5'3" ---------------------------------------------------------------------- Fetal Evaluation  Num Of Fetuses:         1  Fetal Heart Rate(bpm):  140  Cardiac Activity:       Observed  Presentation:           Cephalic  Placenta:               Posterior  Amniotic Fluid  AFI FV:      Subjectively upper-normal  AFI Sum(cm)     %Tile       Largest Pocket(cm)  21.88           84          7.73  RUQ(cm)       RLQ(cm)       LUQ(cm)        LLQ(cm)  2.47          6.21          5.47           7.73  Comment:    Stomach, bladder, and diaphragm noted. ---------------------------------------------------------------------- Biophysical Evaluation  Amniotic F.V:   Within normal limits       F. Tone:        Observed  F. Movement:    Observed                   Score:          8/8  F. Breathing:   Observed ---------------------------------------------------------------------- OB History  Gravidity:    4         Term:   1        Prem:   2  Living:       2 ---------------------------------------------------------------------- Gestational Age  LMP:           32w 3d        Date:  10/02/17                 EDD:   07/09/18  Best:          Milderd Meager 3d     Det. By:  LMP  (10/02/17)          EDD:   07/09/18  ---------------------------------------------------------------------- Cervix Uterus Adnexa  Cervix  Not visualized (advanced GA >24wks)  Uterus  Multiple fibroids noted, see table below.  Left Ovary  Not visualized.  Right Ovary  Not visualized.  Adnexa  No abnormality visualized. ---------------------------------------------------------------------- Myomas   Site                     L(cm)      W(cm)      D(cm)      Location   Anterior LUS             3.9        1.9        4.2   Left LUS                 5.8        4.3        4.6  ----------------------------------------------------------------------   Blood Flow                 RI        PI       Comments  ---------------------------------------------------------------------- Impression  Patient was admitted for control of diabetes.  Amniotic fluid is normal and good fetal activity is seen.  Antenatal testing is reassuring. BPP 8/8. Two myomas are  seen (above). ---------------------------------------------------------------------- Recommendations  -  Continue weekly BPP. ----------------------------------------------------------------------                  Tama High, MD Electronically Signed Final Report   05/17/2018 01:02 pm ----------------------------------------------------------------------  [place] Mfm Ob Follow Up  Result Date: 05/09/2018 ----------------------------------------------------------------------  OBSTETRICS REPORT                         (Signed Final 05/09/2018 05:00 pm) ---------------------------------------------------------------------- Patient Info  ID #:        [id number]                          D.O.B.:  [birth year] (27 yrs)  Name:        Grace Montgomery                Visit Date: 05/09/2018 03:26 pm ---------------------------------------------------------------------- Performed By  Performed By:      Valda Favia          Ref. Address:      [place]  Attending:          Sander Nephew      [facility]  Referred By:       Jorje Guild                     CNM ---------------------------------------------------------------------- Orders   #  Description                           Code         Ordered By   1  [place] MFM OB FOLLOW UP                   02409.73     Sander Nephew  ----------------------------------------------------------------------   #  Order #                     Accession #                 Episode #   1  [id number]                   [id number]                  [id number]  ---------------------------------------------------------------------- Indications   Pre-existing diabetes, type 2, in pregnancy,    O24.112   second trimester (Insulin)   Poor obstetric history (prior pre-term delivery)O09.219   (36w x2--Induced for poor glycemic control)   Asthma (Inhaler)  O99.89 j45.909   Poor obstetric history: Previous gestational    O09.299   diabetes   Obesity complicating pregnancy, second          O99.212   trimester   Smoking complicating pregnancy, second          O99.332   trimester (0.25 pk/day)   Uterine fibroids affecting pregnancy in second  O34.12, D25.9   trimester, antepartum   Encounter for other antenatal screening         Z36.2   follow-up   [redacted] weeks gestation of pregnancy                 Z3A.31  ---------------------------------------------------------------------- Vital Signs                                                  Height:        5'3" ---------------------------------------------------------------------- Fetal Evaluation  Num Of Fetuses:          1  Fetal Heart Rate(bpm):   126  Cardiac Activity:        Observed  Presentation:            Cephalic  Placenta:                Posterior  P. Cord Insertion:       Previously seen as normal  Amniotic Fluid  AFI FV:      Within normal limits  AFI Sum(cm)     %Tile        Largest Pocket(cm)  15.38           55          5.06  RUQ(cm)       RLQ(cm)        LUQ(cm)        LLQ(cm)  5.06          4.85           3.34           2.13 ---------------------------------------------------------------------- Biometry  BPD:      77.3   mm     G. Age:  31w 0d         31  %    CI:          73.4  %    70 - 86                                                           FL/HC:       20.5  %    19.3 - 21.3  HC:      286.7   mm     G. Age:  31w 3d         21  %    HC/AC:       1.03       0.96 - 1.17  AC:      277.6   mm     G. Age:  31w 6d         63  %    FL/BPD:      76.1  %    71 - 87  FL:       58.8   mm     G. Age:  30w 5d         22  %    FL/AC:       21.2  %    20 - 24  HUM:      51.8   mm     G. Age:  30w 2d         30  %  Est. FW:    1754   gm    3 lb 14 oz      57  % ---------------------------------------------------------------------- OB History  Gravidity:     4         Term:  1          Prem:  2  Living:        2 ---------------------------------------------------------------------- Gestational Age  LMP:            31w 2d       Date:  10/02/17                   EDD:  07/09/18  U/S Today:      31w 2d                                         EDD:  07/09/18  Best:           31w 2d    Det. By:  LMP  (10/02/17)            EDD:  07/09/18 ---------------------------------------------------------------------- Anatomy  Cranium:                Appears normal         Aortic Arch:            Previously seen  Cavum:                  Previously seen        Ductal Arch:            Previously seen  Ventricles:             Appears normal         Diaphragm:              Previously seen  Choroid Plexus:         Previously seen        Stomach:                Appears normal, left                                                                         sided  Cerebellum:             Previously seen        Abdomen:                Previously seen  Posterior Fossa:        Previously seen        Abdominal Wall:          Previously seen  Nuchal Fold:            Previously seen        Cord Vessels:           Previously seen  Face:                   Orbits and profile     Kidneys:                Appear normal                          previously seen  Lips:                   Previously seen        Bladder:                Appears normal  Thoracic:               Appears normal         Spine:                  Previously seen  Heart:                  Previously seen        Upper Extremities:      Previously seen  RVOT:                   Previously seen        Lower Extremities:      Previously seen  LVOT:                   Previously seen  Other:   Female gender. Heels and 5th digit previously visualized. Open hands           previously visualized. Nasal bone previously visualized. ---------------------------------------------------------------------- Cervix Uterus Adnexa  Cervix  Not visualized (advanced GA >24wks) ---------------------------------------------------------------------- Impression  Normal interval growth.  BPP 8/8 ---------------------------------------------------------------------- Recommendations  Follow up weekly testing given T2DM previously scheduled. ----------------------------------------------------------------------               Sander Nephew, MD Electronically Signed Final Report   05/09/2018 05:00 pm ----------------------------------------------------------------------    Medications:  Scheduled . docusate sodium  100 mg Oral Daily  . enoxaparin (LOVENOX) injection  60 mg Subcutaneous Q24H  . insulin aspart  0-32 Units Subcutaneous QID  . insulin aspart  14 Units Subcutaneous TID WC  . insulin NPH Human  22 Units Subcutaneous QHS  . insulin NPH Human  24 Units Subcutaneous QAC breakfast  . prenatal multivitamin  1 tablet Oral Q1200   I have reviewed the patient's current medications.    ASSESSMENT: [id number] [redacted]w[redacted]d Estimated Date of Delivery: 07/09/18  Patient Active Problem List   Diagnosis  Date Noted  . Poorly controlled type 2 diabetes mellitus ([place]) 05/16/2018  . Rh negative state in antepartum period 03/02/2018  . Round ligament pain 02/02/2018  . Supervision of high risk pregnancy, antepartum 01/12/2018  . Type 2 diabetes mellitus complicating pregnancy in second trimester, antepartum 01/05/2018  . MDD (major depressive disorder), recurrent severe, without psychosis ([place]) 02/16/2017  . Rash and nonspecific skin eruption 08/22/2012  . Obesity 06/29/2011  . Seasonal allergies 06/29/2011  . Asthma, mild persistent 06/29/2011  . Major depressive disorder 04/26/2011    Class: Acute    PLAN: GDM- all values out of range will 24 in pm Normal  BPP 3/11 Continue current inpatient care   Grace Montgomery 05/18/2018,12:14 PM

## 2018-05-18 NOTE — Progress Notes (Addendum)
Inpatient Diabetes Program Recommendations  AACE/ADA: New Consensus Statement on Inpatient Glycemic Control (2015)  Target Ranges:  Prepandial:   less than 140 mg/dL      Peak postprandial:   less than 180 mg/dL (1-2 hours)      Critically ill patients:  140 - 180 mg/dL   Lab Results  Component Value Date   GLUCAP 186 (H) 05/18/2018   HGBA1C 5.8 (H) 01/12/2018    Review of Glycemic Control Results for Grace Montgomery, Grace Montgomery (MRN [id number]) as of 05/18/2018 11:52  Ref. Range 05/17/2018 21:39 05/17/2018 23:03 05/18/2018 08:39 05/18/2018 10:49  Glucose-Capillary Latest Ref Range: 70 - 99 mg/dL 189 (H) 243 (H) 130 (H) 186 (H)   Diabetes history: DM2 Outpatient Diabetes medications: Humulin N 24 units am + 20 units hs + Humulin R 14 units bid ac meals Current orders for Inpatient glycemic control: Novolin N 24 units ac breakfast + 20 units hs + Novolog 14 units tid meal coverage if eats 50%  Consider increasing NPH to 22 units QHS.   Spoke with patient regarding outpatient diabetes management. Verified home medications and patient was not taking insulin prior to pregnancy.   Reviewed patient's current A1c of 5.8%. Explained what a A1c is and what it measures. Also reviewed goal A1c with patient, importance of good glucose control @ home, and blood sugar goals. Reviewed patho of DM, need for insulin, increased insulin needs in the 3rd trimester, what to expect for post partum insulin needs, hormonal impacts, vascular changes and co-morbidies associated.  Patient has a meter and has been checking blood sugars. Encouraged to continue and follow up at post partum visit. Reviewed questions to ask MD at that time and when to follow up. Also, given patient has pregnancy Medicaid, reviewed PCP options with patient following delivery. Patient has no further questions at this time.  Of note, has had issues with carb alottment given carb modified diet. Additional information added to orders.   Thanks, Bronson Curb, MSN, RNC-OB Diabetes Coordinator [phone] (8a-5p)

## 2018-05-19 LAB — TYPE AND SCREEN
ABO/RH(D): A NEG
Antibody Screen: POSITIVE
Unit division: 0
Unit division: 0

## 2018-05-19 LAB — GLUCOSE, CAPILLARY
GLUCOSE-CAPILLARY: 145 mg/dL — AB (ref 70–99)
Glucose-Capillary: 206 mg/dL — ABNORMAL HIGH (ref 70–99)
Glucose-Capillary: 89 mg/dL (ref 70–99)
Glucose-Capillary: 127 mg/dL — ABNORMAL HIGH (ref 70–99)
Glucose-Capillary: 74 mg/dL (ref 70–99)
Glucose-Capillary: 207 mg/dL — ABNORMAL HIGH (ref 70–99)
Glucose-Capillary: 186 mg/dL — ABNORMAL HIGH (ref 70–99)

## 2018-05-19 LAB — BPAM RBC
BLOOD PRODUCT EXPIRATION DATE: 202004082359
Blood Product Expiration Date: 202004092359
UNIT TYPE AND RH: 600
Unit Type and Rh: 600

## 2018-05-19 MED ORDER — INSULIN NPH (HUMAN) (ISOPHANE) 100 UNIT/ML ~~LOC~~ SUSP
28.0000 [IU] | Freq: Every day | SUBCUTANEOUS | Status: DC
  Administered 2018-05-20 – 2018-05-22 (×3): 28 [IU] via SUBCUTANEOUS

## 2018-05-19 MED ORDER — INSULIN NPH (HUMAN) (ISOPHANE) 100 UNIT/ML ~~LOC~~ SUSP: 26.0000 [IU] | Freq: Every day | SUBCUTANEOUS | Status: DC

## 2018-05-19 MED ORDER — INSULIN NPH (HUMAN) (ISOPHANE) 100 UNIT/ML ~~LOC~~ SUSP
28.0000 [IU] | Freq: Every day | SUBCUTANEOUS | Status: DC
  Administered 2018-05-19 – 2018-05-21 (×3): 28 [IU] via SUBCUTANEOUS

## 2018-05-19 MED ORDER — INSULIN ASPART 100 UNIT/ML ~~LOC~~ SOLN
18.0000 [IU] | Freq: Three times a day (TID) | SUBCUTANEOUS | Status: DC
  Administered 2018-05-20 – 2018-05-22 (×7): 18 [IU] via SUBCUTANEOUS

## 2018-05-19 NOTE — Progress Notes (Signed)
Patient ID: BROOKLYNNE PEREIDA, female   DOB: 1990-07-14, 28 y.o.   MRN: [id number] [place]) NOTE  Grace Montgomery is a 28 y.o. [phone] at [redacted]w[redacted]d  who is admitted for poorly controlled type 2 diabetes.    Fetal presentation is cephalic. Length of Stay:  3  Days  Date of admission:05/16/2018  Subjective: Patient reports feeling well and reports occasional lower pelvic pressure unchanged since admission Patient reports the fetal movement as active. Patient reports uterine contraction  activity as irregular. Patient reports  vaginal bleeding as none. Patient describes fluid per vagina as None.  Vitals:  Blood pressure (!) 115/57, pulse (!) 107, temperature 97.8 F (36.6 C), temperature source Oral, resp. rate 18, height 5\' 3"  (1.6 m), weight 118.8 kg, last menstrual period 10/02/2017, SpO2 99 %. Vitals:   05/18/18 1621 05/18/18 2030 05/19/18 0600 05/19/18 0755  BP: 113/60 128/64 (!) 114/49 (!) 115/57  Pulse: (!) 101 86 98 (!) 107  Resp: 18 20  18   Temp: 98.2 F (36.8 C) 98 F (36.7 C) 98 F (36.7 C) 97.8 F (36.6 C)  TempSrc: Oral Oral Oral Oral  SpO2: 99% 99% 100% 99%  Weight:      Height:       Physical Examination: GENERAL: Well-developed, well-nourished female in no acute distress.  LUNGS: Clear to auscultation bilaterally.  HEART: Regular rate and rhythm. ABDOMEN: Soft, nontender, gravid PELVIC: Not indicated EXTREMITIES: No cyanosis, clubbing, or edema, 2+ distal pulses.   Fetal Monitoring:  Baseline: 115 bpm, Variability: Good {> 6 bpm), Accelerations: Reactive and Decelerations: Absent   Reactive TOCO: no contractions  Labs:  Results for orders placed or performed during the hospital encounter of 05/16/18 (from the past 24 hour(s))  Glucose, capillary   Collection Time: 05/18/18 10:49 AM  Result Value Ref Range   Glucose-Capillary 186 (H) 70 - 99 mg/dL  Glucose, capillary   Collection Time: 05/18/18 12:13 PM  Result Value Ref Range    Glucose-Capillary 101 (H) 70 - 99 mg/dL  Glucose, capillary   Collection Time: 05/18/18  1:08 PM  Result Value Ref Range   Glucose-Capillary 75 70 - 99 mg/dL  Glucose, capillary   Collection Time: 05/18/18  2:59 PM  Result Value Ref Range   Glucose-Capillary 219 (H) 70 - 99 mg/dL  Glucose, capillary   Collection Time: 05/18/18  6:04 PM  Result Value Ref Range   Glucose-Capillary 171 (H) 70 - 99 mg/dL  Glucose, capillary   Collection Time: 05/18/18  8:39 PM  Result Value Ref Range   Glucose-Capillary 231 (H) 70 - 99 mg/dL  Glucose, capillary   Collection Time: 05/18/18 10:55 PM  Result Value Ref Range   Glucose-Capillary 217 (H) 70 - 99 mg/dL  Glucose, capillary   Collection Time: 05/19/18  8:38 AM  Result Value Ref Range   Glucose-Capillary 145 (H) 70 - 99 mg/dL    Imaging Studies:    [place] Mfm Fetal Bpp Wo Non Stress  Result Date: 05/17/2018 ----------------------------------------------------------------------  OBSTETRICS REPORT                       (Signed Final 05/17/2018 01:02 pm) ---------------------------------------------------------------------- Patient Info  ID #:       [id number]                          D.O.B.:  1990-08-29 (27 yrs)  Name:       Grace Montgomery  Visit Date: 05/17/2018 10:28 am ---------------------------------------------------------------------- Performed By  Performed By:     Hubert Azure          Ref. Address:     [place]  Attending:        Tama High MD        [facility]                                                             [place]  Referred By:      Jorje Guild                    CNM ---------------------------------------------------------------------- Orders   #  Description                          Code         Ordered By   1  [place] MFM FETAL BPP WO NON              76819.01     [place]   ----------------------------------------------------------------------   #  Order #                    Accession #                 Episode #   1  [id number]                  [id number]                  [id number]  ---------------------------------------------------------------------- Indications   Poor obstetric history (prior pre-term         O09.219   delivery) (36w x2--Induced for poor glycemic   control)   Asthma (Inhaler)                               O99.89 j45.909   Poor obstetric history: Previous gestational   O09.299   diabetes   Obesity complicating pregnancy, third          O99.213   trimester   Pre-existing diabetes, type 2, in pregnancy,   O24.113   third trimester   Smoking complicating pregnancy, third          O99.333   trimester   [redacted] weeks gestation of pregnancy                Z3A.32  ---------------------------------------------------------------------- Vital Signs  Height:        5'3" ---------------------------------------------------------------------- Fetal Evaluation  Num Of Fetuses:         1  Fetal Heart Rate(bpm):  140  Cardiac Activity:       Observed  Presentation:           Cephalic  Placenta:               Posterior  Amniotic Fluid  AFI FV:      Subjectively upper-normal  AFI Sum(cm)     %Tile       Largest Pocket(cm)  21.88           84          7.73  RUQ(cm)       RLQ(cm)       LUQ(cm)        LLQ(cm)  2.47          6.21          5.47           7.73  Comment:    Stomach, bladder, and diaphragm noted. ---------------------------------------------------------------------- Biophysical Evaluation  Amniotic F.V:   Within normal limits       F. Tone:        Observed  F. Movement:    Observed                   Score:          8/8  F. Breathing:   Observed ---------------------------------------------------------------------- OB History  Gravidity:    4         Term:   1        Prem:   2  Living:       2  ---------------------------------------------------------------------- Gestational Age  LMP:           32w 3d        Date:  10/02/17                 EDD:   07/09/18  Best:          Milderd Meager 3d     Det. By:  LMP  (10/02/17)          EDD:   07/09/18 ---------------------------------------------------------------------- Cervix Uterus Adnexa  Cervix  Not visualized (advanced GA >24wks)  Uterus  Multiple fibroids noted, see table below.  Left Ovary  Not visualized.  Right Ovary  Not visualized.  Adnexa  No abnormality visualized. ---------------------------------------------------------------------- Myomas   Site                     L(cm)      W(cm)      D(cm)      Location   Anterior LUS             3.9        1.9        4.2   Left LUS                 5.8        4.3        4.6  ----------------------------------------------------------------------   Blood Flow                 RI        PI       Comments  ---------------------------------------------------------------------- Impression  Patient was admitted for control of diabetes.  Amniotic fluid is normal and good fetal activity is seen.  Antenatal testing  is reassuring. BPP 8/8. Two myomas are  seen (above). ---------------------------------------------------------------------- Recommendations  -Continue weekly BPP. ----------------------------------------------------------------------                  Tama High, MD Electronically Signed Final Report   05/17/2018 01:02 pm ----------------------------------------------------------------------  [place] Mfm Ob Follow Up  Result Date: 05/09/2018 ----------------------------------------------------------------------  OBSTETRICS REPORT                         (Signed Final 05/09/2018 05:00 pm) ---------------------------------------------------------------------- Patient Info  ID #:        [id number]                          D.O.B.:  05/31/1990 (27 yrs)  Name:        Grace Montgomery                Visit Date: 05/09/2018 03:26 pm  ---------------------------------------------------------------------- Performed By  Performed By:      Valda Favia          Ref. Address:      [place]  Attending:         Sander Nephew      [facility]  Referred By:       Jorje Guild                     CNM ---------------------------------------------------------------------- Orders   #  Description                           Code         Ordered By   1  [place] MFM OB FOLLOW UP                   40102.72     Sander Nephew  ----------------------------------------------------------------------   #  Order #                     Accession #                 Episode #   1  [id number]                   [id number]                  [id number]  ---------------------------------------------------------------------- Indications   Pre-existing diabetes, type 2, in pregnancy,    O24.112   second trimester (Insulin)   Poor obstetric history (prior pre-term delivery)O09.219   (36w x2--Induced for poor glycemic control)   Asthma (Inhaler)  O99.89 j45.909   Poor obstetric history: Previous gestational    O09.299   diabetes   Obesity complicating pregnancy, second          O99.212   trimester   Smoking complicating pregnancy, second          O99.332   trimester (0.25 pk/day)   Uterine fibroids affecting pregnancy in second  O34.12, D25.9   trimester, antepartum   Encounter for other antenatal screening         Z36.2   follow-up   [redacted] weeks gestation of pregnancy                 Z3A.31  ---------------------------------------------------------------------- Vital Signs                                                  Height:        5'3" ---------------------------------------------------------------------- Fetal Evaluation  Num Of Fetuses:          1  Fetal Heart Rate(bpm):    126  Cardiac Activity:        Observed  Presentation:            Cephalic  Placenta:                Posterior  P. Cord Insertion:       Previously seen as normal  Amniotic Fluid  AFI FV:      Within normal limits  AFI Sum(cm)     %Tile       Largest Pocket(cm)  15.38           55          5.06  RUQ(cm)       RLQ(cm)        LUQ(cm)        LLQ(cm)  5.06          4.85           3.34           2.13 ---------------------------------------------------------------------- Biometry  BPD:      77.3   mm     G. Age:  31w 0d         31  %    CI:          73.4  %    70 - 86                                                           FL/HC:       20.5  %    19.3 - 21.3  HC:      286.7   mm     G. Age:  31w 3d         21  %    HC/AC:       1.03       0.96 - 1.17  AC:      277.6   mm     G. Age:  31w 6d         63  %    FL/BPD:      76.1  %    71 - 87  FL:       58.8   mm     G. Age:  30w 5d         22  %    FL/AC:       21.2  %    20 - 24  HUM:      51.8   mm     G. Age:  30w 2d         30  %  Est. FW:    1754   gm    3 lb 14 oz      57  % ---------------------------------------------------------------------- OB History  Gravidity:     4         Term:  1          Prem:  2  Living:        2 ---------------------------------------------------------------------- Gestational Age  LMP:            31w 2d       Date:  10/02/17                   EDD:  07/09/18  U/S Today:      31w 2d                                         EDD:  07/09/18  Best:           31w 2d    Det. By:  LMP  (10/02/17)            EDD:  07/09/18 ---------------------------------------------------------------------- Anatomy  Cranium:                Appears normal         Aortic Arch:            Previously seen  Cavum:                  Previously seen        Ductal Arch:            Previously seen  Ventricles:             Appears normal         Diaphragm:              Previously seen  Choroid Plexus:         Previously seen        Stomach:                Appears normal,  left                                                                         sided  Cerebellum:             Previously seen        Abdomen:                Previously seen  Posterior Fossa:        Previously seen        Abdominal Wall:         Previously seen  Nuchal Fold:            Previously seen        Cord Vessels:           Previously seen  Face:                   Orbits and profile     Kidneys:                Appear normal                          previously seen  Lips:                   Previously seen        Bladder:                Appears normal  Thoracic:               Appears normal         Spine:                  Previously seen  Heart:                  Previously seen        Upper Extremities:      Previously seen  RVOT:                   Previously seen        Lower Extremities:      Previously seen  LVOT:                   Previously seen  Other:   Female gender. Heels and 5th digit previously visualized. Open hands           previously visualized. Nasal bone previously visualized. ---------------------------------------------------------------------- Cervix Uterus Adnexa  Cervix  Not visualized (advanced GA >24wks) ---------------------------------------------------------------------- Impression  Normal interval growth.  BPP 8/8 ---------------------------------------------------------------------- Recommendations  Follow up weekly testing given T2DM previously scheduled. ----------------------------------------------------------------------               Sander Nephew, MD Electronically Signed Final Report   05/09/2018 05:00 pm ----------------------------------------------------------------------    Medications:  Scheduled . docusate sodium  100 mg Oral Daily  . enoxaparin (LOVENOX) injection  60 mg Subcutaneous Q24H  . insulin aspart  0-32 Units Subcutaneous QID  . insulin aspart  14 Units Subcutaneous TID WC  . insulin NPH Human  24 Units Subcutaneous QHS  . [START ON 05/20/2018]  insulin NPH Human  26 Units Subcutaneous QAC breakfast  . prenatal multivitamin  1 tablet Oral Q1200   I have reviewed the patient's current medications.    ASSESSMENT: O6V6720 [redacted]w[redacted]d Estimated Date of Delivery: 07/09/18  Patient Active Problem List   Diagnosis Date Noted  . Poorly controlled type 2 diabetes mellitus ([place]) 05/16/2018  . Rh negative state in antepartum period 03/02/2018  . Supervision of high risk pregnancy, antepartum 01/12/2018  . Type 2 diabetes mellitus complicating pregnancy in second trimester, antepartum 01/05/2018  . MDD (major depressive disorder), recurrent severe, without psychosis ([place]) 02/16/2017  . Maternal morbid obesity, 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[place]) 06/29/2011  . Major depressive disorder 04/26/2011    Class: Acute    PLAN: GDM- all values out of range will increase am NPH 26 Normal BPP 3/11 Continue current inpatient care   Grace Montgomery 05/19/2018,9:51 AM

## 2018-05-19 NOTE — Progress Notes (Signed)
Inpatient Diabetes Program Recommendations  AACE/ADA: New Consensus Statement on Inpatient Glycemic Control (2015)  Target Ranges:  Prepandial:   less than 140 mg/dL      Peak postprandial:   less than 180 mg/dL (1-2 hours)      Critically ill patients:  140 - 180 mg/dL   Lab Results  Component Value Date   GLUCAP 145 (H) 05/19/2018   HGBA1C 5.8 (H) 01/12/2018    Review of Glycemic Control Results for Grace Montgomery, Grace Montgomery (MRN [id number]) as of 05/19/2018 09:24  Ref. Range 05/18/2018 18:04 05/18/2018 20:39 05/18/2018 22:55 05/19/2018 08:38  Glucose-Capillary Latest Ref Range: 70 - 99 mg/dL 171 (H) 231 (H) 217 (H) 145 (H)   Diabetes history:DM2 Outpatient Diabetes medications:Humulin N 24 units am + 20 units hs + Humulin R 14 units bid ac meals Current orders for Inpatient glycemic control:Novolin N 26 units ac breakfast + 24 units hs + Novolog 14 units tid meal coverage if eats 50% + Novolog 0-32 units QID   Inpatient Diabetes Program Recommendations:    Glucose trends are above inpatient goals.   Consider increasing NPH to 28 units BID and increasing Novolog 18 units TID (assuming that patient is consuming >50% of meals).   Will continue to follow.   Thanks, Bronson Curb, MSN, RNC-OB Diabetes Coordinator [phone] (8a-5p)

## 2018-05-20 DIAGNOSIS — Z3A33 33 weeks gestation of pregnancy: Secondary | ICD-10-CM

## 2018-05-20 DIAGNOSIS — O24112 Pre-existing diabetes mellitus, type 2, in pregnancy, second trimester: Secondary | ICD-10-CM

## 2018-05-20 DIAGNOSIS — E1165 Type 2 diabetes mellitus with hyperglycemia: Secondary | ICD-10-CM

## 2018-05-20 LAB — GLUCOSE, CAPILLARY
GLUCOSE-CAPILLARY: 198 mg/dL — AB (ref 70–99)
Glucose-Capillary: 97 mg/dL (ref 70–99)
Glucose-Capillary: 149 mg/dL — ABNORMAL HIGH (ref 70–99)
Glucose-Capillary: 168 mg/dL — ABNORMAL HIGH (ref 70–99)
Glucose-Capillary: 111 mg/dL — ABNORMAL HIGH (ref 70–99)
Glucose-Capillary: 234 mg/dL — ABNORMAL HIGH (ref 70–99)

## 2018-05-20 NOTE — Progress Notes (Signed)
FACULTY PRACTICE ANTEPARTUM PROGRESS NOTE  Grace Montgomery is a 28 y.o. [phone] at [redacted]w[redacted]d who is admitted for uncontrolled Type 2 DM.  Estimated Date of Delivery: 07/09/18 Fetal presentation is cephalic.  Length of Stay:  4 Days. Admitted 05/16/2018  Subjective:  Patient reports normal fetal movement.  She denies uterine contractions, denies bleeding and leaking of fluid per vagina. Does report the usual pelvic pressure she has been feeling in the pregnancy.  Vitals:  Blood pressure (!) 103/59, pulse (!) 117, temperature 98 F (36.7 C), temperature source Oral, resp. rate 18, height 5\' 3"  (1.6 m), weight 118.8 kg, last menstrual period 10/02/2017, SpO2 99 %. Physical Examination: CONSTITUTIONAL: Well-developed, well-nourished female in no acute distress.  HENT:  Normocephalic, atraumatic, External right and left ear normal. Oropharynx is clear and moist EYES: Conjunctivae and EOM are normal. Pupils are equal, round, and reactive to light. No scleral icterus.  NECK: Normal range of motion, supple, no masses. SKIN: Skin is warm and dry. No rash noted. Not diaphoretic. No erythema. No pallor. [place]: Alert and oriented to person, place, and time. Normal reflexes, muscle tone coordination. No cranial nerve deficit noted. PSYCHIATRIC: Normal mood and affect. Normal behavior. Normal judgment and thought content. CARDIOVASCULAR: Normal heart rate noted, regular rhythm RESPIRATORY: Effort and breath sounds normal, no problems with respiration noted MUSCULOSKELETAL: Normal range of motion. No edema and no tenderness. ABDOMEN: Soft, nontender, nondistended, gravid. CERVIX: deferred  Fetal monitoring: FHR: 125 bpm, Variability: moderate, Accelerations: Present, Decelerations: Absent  Uterine activity: no contractions per hour  Results for orders placed or performed during the hospital encounter of 05/16/18 (from the past 48 hour(s))  Glucose, capillary     Status: Abnormal   Collection Time:  05/18/18  2:59 PM  Result Value Ref Range   Glucose-Capillary 219 (H) 70 - 99 mg/dL  Glucose, capillary     Status: Abnormal   Collection Time: 05/18/18  6:04 PM  Result Value Ref Range   Glucose-Capillary 171 (H) 70 - 99 mg/dL  Glucose, capillary     Status: Abnormal   Collection Time: 05/18/18  8:39 PM  Result Value Ref Range   Glucose-Capillary 231 (H) 70 - 99 mg/dL  Glucose, capillary     Status: Abnormal   Collection Time: 05/18/18 10:55 PM  Result Value Ref Range   Glucose-Capillary 217 (H) 70 - 99 mg/dL  Glucose, capillary     Status: Abnormal   Collection Time: 05/19/18  8:38 AM  Result Value Ref Range   Glucose-Capillary 145 (H) 70 - 99 mg/dL  Type and screen [address]     Status: None (Preliminary result)   Collection Time: 05/19/18  9:47 AM  Result Value Ref Range   ABO/RH(D) A NEG    Antibody Screen POS    Sample Expiration 05/22/2018    Antibody Identification PASSIVELY ACQUIRED ANTI-D    Unit Number [id number]    Blood Component Type RED CELLS,LR    Unit division 00    Status of Unit ALLOCATED    Transfusion Status OK TO TRANSFUSE    Crossmatch Result COMPATIBLE    Unit Number [id number]    Blood Component Type RBC LR PHER2    Unit division 00    Status of Unit ALLOCATED    Transfusion Status OK TO TRANSFUSE    Crossmatch Result COMPATIBLE   Glucose, capillary     Status: Abnormal   Collection Time: 05/19/18 10:41 AM  Result Value Ref Range   Glucose-Capillary  206 (H) 70 - 99 mg/dL  Glucose, capillary     Status: None   Collection Time: 05/19/18 12:50 PM  Result Value Ref Range   Glucose-Capillary 89 70 - 99 mg/dL  Glucose, capillary     Status: Abnormal   Collection Time: 05/19/18  3:03 PM  Result Value Ref Range   Glucose-Capillary 127 (H) 70 - 99 mg/dL  Glucose, capillary     Status: None   Collection Time: 05/19/18  6:18 PM  Result Value Ref Range   Glucose-Capillary 74 70 - 99 mg/dL  Glucose, capillary     Status:  Abnormal   Collection Time: 05/19/18  8:23 PM  Result Value Ref Range   Glucose-Capillary 207 (H) 70 - 99 mg/dL  Glucose, capillary     Status: Abnormal   Collection Time: 05/19/18 10:22 PM  Result Value Ref Range   Glucose-Capillary 186 (H) 70 - 99 mg/dL  Glucose, capillary     Status: None   Collection Time: 05/20/18  9:20 AM  Result Value Ref Range   Glucose-Capillary 97 70 - 99 mg/dL  Glucose, capillary     Status: Abnormal   Collection Time: 05/20/18 11:44 AM  Result Value Ref Range   Glucose-Capillary 149 (H) 70 - 99 mg/dL    No results found.  Current scheduled medications . docusate sodium  100 mg Oral Daily  . enoxaparin (LOVENOX) injection  60 mg Subcutaneous Q24H  . insulin aspart  0-32 Units Subcutaneous QID  . insulin aspart  18 Units Subcutaneous TID WC  . insulin NPH Human  28 Units Subcutaneous QHS  . insulin NPH Human  28 Units Subcutaneous QAC breakfast  . prenatal multivitamin  1 tablet Oral Q1200    I have reviewed the patient's current medications.  ASSESSMENT: Principal Problem:   Poorly controlled type 2 diabetes mellitus ([place]) Active Problems:   Maternal morbid obesity, 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[place])   Type 2 diabetes mellitus complicating pregnancy in second trimester, antepartum   Supervision of high risk pregnancy, antepartum   Rh negative state in antepartum period   PLAN:  - NPH increased yesterday to 28 units BID - novolog increased yesterday to 18 units TID - Somewhat improved CBG overnight, fasting appropriate but pre-meal, still significantly increased, will increase again tomorrow prn - BPP 8/8 05/17/18   Continue routine antenatal care.   Feliz Beam, M.D. Attending [facility] [employer] [facility] (Faculty Practice)  05/20/2018 2:40 PM

## 2018-05-21 LAB — GLUCOSE, CAPILLARY
Glucose-Capillary: 103 mg/dL — ABNORMAL HIGH (ref 70–99)
Glucose-Capillary: 221 mg/dL — ABNORMAL HIGH (ref 70–99)
Glucose-Capillary: 75 mg/dL (ref 70–99)
Glucose-Capillary: 190 mg/dL — ABNORMAL HIGH (ref 70–99)
Glucose-Capillary: 153 mg/dL — ABNORMAL HIGH (ref 70–99)
Glucose-Capillary: 192 mg/dL — ABNORMAL HIGH (ref 70–99)

## 2018-05-22 LAB — GLUCOSE, CAPILLARY
Glucose-Capillary: 123 mg/dL — ABNORMAL HIGH (ref 70–99)
Glucose-Capillary: 216 mg/dL — ABNORMAL HIGH (ref 70–99)
Glucose-Capillary: 158 mg/dL — ABNORMAL HIGH (ref 70–99)
Glucose-Capillary: 181 mg/dL — ABNORMAL HIGH (ref 70–99)
Glucose-Capillary: 106 mg/dL — ABNORMAL HIGH (ref 70–99)
Glucose-Capillary: 59 mg/dL — ABNORMAL LOW (ref 70–99)
Glucose-Capillary: 187 mg/dL — ABNORMAL HIGH (ref 70–99)
Glucose-Capillary: 158 mg/dL — ABNORMAL HIGH (ref 70–99)

## 2018-05-22 MED ORDER — INSULIN NPH (HUMAN) (ISOPHANE) 100 UNIT/ML ~~LOC~~ SUSP
30.0000 [IU] | Freq: Every day | SUBCUTANEOUS | Status: DC
  Administered 2018-05-22 – 2018-05-23 (×2): 30 [IU] via SUBCUTANEOUS

## 2018-05-22 MED ORDER — INSULIN ASPART 100 UNIT/ML ~~LOC~~ SOLN
20.0000 [IU] | Freq: Three times a day (TID) | SUBCUTANEOUS | Status: DC
  Administered 2018-05-22 – 2018-05-24 (×5): 20 [IU] via SUBCUTANEOUS

## 2018-05-22 MED ORDER — INSULIN NPH (HUMAN) (ISOPHANE) 100 UNIT/ML ~~LOC~~ SUSP
30.0000 [IU] | Freq: Every day | SUBCUTANEOUS | Status: DC
  Administered 2018-05-23 – 2018-05-24 (×2): 30 [IU] via SUBCUTANEOUS

## 2018-05-22 MED ORDER — ALBUTEROL SULFATE (2.5 MG/3ML) 0.083% IN NEBU: 2.5000 mg | INHALATION_SOLUTION | Freq: Four times a day (QID) | RESPIRATORY_TRACT | Status: DC | PRN

## 2018-05-22 NOTE — Progress Notes (Signed)
FACULTY PRACTICE ANTEPARTUM PROGRESS NOTE  DELBERTA FOLTS is a 28 y.o. [phone] at [redacted]w[redacted]d who is admitted for uncontrolled Type 2 DM.  Estimated Date of Delivery: 07/09/18 Fetal presentation is cephalic.  Length of Stay:  6 Days. Admitted 05/16/2018  Subjective:  Patient reports normal fetal movement.  She denies uterine contractions, denies bleeding and leaking of fluid per vagina. Does report the usual pelvic pressure she has been feeling in the pregnancy.  Vitals:  Blood pressure 124/70, pulse (!) 106, temperature 98.2 F (36.8 C), resp. rate 15, height 5\' 3"  (1.6 m), weight 118.8 kg, last menstrual period 10/02/2017, SpO2 100 %. Physical Examination: CONSTITUTIONAL: Well-developed, well-nourished female in no acute distress.  HENT:  Normocephalic, atraumatic, External right and left ear normal. Oropharynx is clear and moist EYES: Conjunctivae and EOM are normal. Pupils are equal, round, and reactive to light. No scleral icterus.  NECK: Normal range of motion, supple, no masses. SKIN: Skin is warm and dry. No rash noted. Not diaphoretic. No erythema. No pallor. [place]: Alert and oriented to person, place, and time. Normal reflexes, muscle tone coordination. No cranial nerve deficit noted. PSYCHIATRIC: Normal mood and affect. Normal behavior. Normal judgment and thought content. CARDIOVASCULAR: Normal heart rate noted, regular rhythm RESPIRATORY: Effort and breath sounds normal, no problems with respiration noted MUSCULOSKELETAL: Normal range of motion. No edema and no tenderness. ABDOMEN: Soft, nontender, nondistended, gravid. CERVIX: deferred  Fetal monitoring: FHR: 125 bpm, Variability: moderate, Accelerations: Present, Decelerations: Absent  Uterine activity: no contractions per hour  Results for orders placed or performed during the hospital encounter of 05/16/18 (from the past 48 hour(s))  Glucose, capillary     Status: None   Collection Time: 05/20/18  9:20 AM  Result Value  Ref Range   Glucose-Capillary 97 70 - 99 mg/dL  Glucose, capillary     Status: Abnormal   Collection Time: 05/20/18 11:44 AM  Result Value Ref Range   Glucose-Capillary 149 (H) 70 - 99 mg/dL  Glucose, capillary     Status: Abnormal   Collection Time: 05/20/18  3:00 PM  Result Value Ref Range   Glucose-Capillary 168 (H) 70 - 99 mg/dL  Glucose, capillary     Status: Abnormal   Collection Time: 05/20/18  5:46 PM  Result Value Ref Range   Glucose-Capillary 111 (H) 70 - 99 mg/dL  Glucose, capillary     Status: Abnormal   Collection Time: 05/20/18  7:45 PM  Result Value Ref Range   Glucose-Capillary 234 (H) 70 - 99 mg/dL  Glucose, capillary     Status: Abnormal   Collection Time: 05/20/18  9:56 PM  Result Value Ref Range   Glucose-Capillary 198 (H) 70 - 99 mg/dL  Glucose, capillary     Status: Abnormal   Collection Time: 05/21/18  8:31 AM  Result Value Ref Range   Glucose-Capillary 103 (H) 70 - 99 mg/dL  Glucose, capillary     Status: Abnormal   Collection Time: 05/21/18 11:01 AM  Result Value Ref Range   Glucose-Capillary 221 (H) 70 - 99 mg/dL  Glucose, capillary     Status: None   Collection Time: 05/21/18  1:48 PM  Result Value Ref Range   Glucose-Capillary 75 70 - 99 mg/dL  Glucose, capillary     Status: Abnormal   Collection Time: 05/21/18  4:24 PM  Result Value Ref Range   Glucose-Capillary 190 (H) 70 - 99 mg/dL  Glucose, capillary     Status: Abnormal   Collection Time: 05/21/18  5:40 PM  Result Value Ref Range   Glucose-Capillary 153 (H) 70 - 99 mg/dL  Glucose, capillary     Status: Abnormal   Collection Time: 05/21/18  8:36 PM  Result Value Ref Range   Glucose-Capillary 192 (H) 70 - 99 mg/dL    No results found.  Current scheduled medications . docusate sodium  100 mg Oral Daily  . enoxaparin (LOVENOX) injection  60 mg Subcutaneous Q24H  . insulin aspart  0-32 Units Subcutaneous QID  . insulin aspart  18 Units Subcutaneous TID WC  . insulin NPH Human  28  Units Subcutaneous QHS  . insulin NPH Human  28 Units Subcutaneous QAC breakfast  . prenatal multivitamin  1 tablet Oral Q1200    I have reviewed the patient's current medications.  ASSESSMENT: Principal Problem:   Poorly controlled type 2 diabetes mellitus ([place]) Active Problems:   Maternal morbid obesity, 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[place])   Type 2 diabetes mellitus complicating pregnancy in second trimester, antepartum   Supervision of high risk pregnancy, antepartum   Rh negative state in antepartum period   PLAN:  - NPH increased yesterday to 28 units BID - novolog increased yesterday to 18 units TID - Somewhat improved CBG overnight, fasting appropriate but pre-meal, still significantly increased, will increase again tomorrow prn - BPP 8/8 05/17/18   Continue routine antenatal care.   Florian Buff, MD

## 2018-05-22 NOTE — Progress Notes (Addendum)
Hypoglycemic Event  CBG: 59  Treatment: Juice and dinner in front of pt  Symptoms: n/a  Follow-up CBG: Time: 0037 CBG Result: 106 Possible Reasons for Event:  Comments/MD notified: hypoglycemic protocol already in place    [place], [place] C

## 2018-05-22 NOTE — Progress Notes (Signed)
FACULTY PRACTICE ANTEPARTUM PROGRESS NOTE  Grace Montgomery is a 28 y.o. [phone] at [redacted]w[redacted]d who is admitted for uncontrolled T2DM.  Estimated Date of Delivery: 07/09/18 Fetal presentation is unsure.  Length of Stay:  6 Days. Admitted 05/16/2018  Subjective: Patient reports normal fetal movement.  She denies uterine contractions, denies bleeding and leaking of fluid per vagina. Requesting an inhaler as she is feeling short of breath, states she has asthma and uses an inhaler daily.  Vitals:  Blood pressure 121/64, pulse 100, temperature 98.3 F (36.8 C), temperature source Oral, resp. rate 20, height 5\' 3"  (1.6 m), weight 118.8 kg, last menstrual period 10/02/2017, SpO2 100 %. Physical Examination: CONSTITUTIONAL: Well-developed, well-nourished female in no acute distress.  HENT:  Normocephalic, atraumatic, External right and left ear normal. Oropharynx is clear and moist EYES: Conjunctivae and EOM are normal. Pupils are equal, round, and reactive to light. No scleral icterus.  NECK: Normal range of motion, supple, no masses. SKIN: Skin is warm and dry. No rash noted. Not diaphoretic. No erythema. No pallor. [place]: Alert and oriented to person, place, and time. Normal reflexes, muscle tone coordination. No cranial nerve deficit noted. PSYCHIATRIC: Normal mood and affect. Normal behavior. Normal judgment and thought content. CARDIOVASCULAR: Normal heart rate noted, regular rhythm RESPIRATORY: Effort and breath sounds normal, no problems with respiration noted MUSCULOSKELETAL: Normal range of motion. No edema and no tenderness. ABDOMEN: Soft, nontender, nondistended, gravid. CERVIX: deferred  Fetal monitoring: FHR: 125 bpm, Variability: moderate, Accelerations: Present, Decelerations: Absent  Uterine activity: no contractions per hour  Results for orders placed or performed during the hospital encounter of 05/16/18 (from the past 48 hour(s))  Glucose, capillary     Status: Abnormal   Collection Time: 05/20/18 11:44 AM  Result Value Ref Range   Glucose-Capillary 149 (H) 70 - 99 mg/dL  Glucose, capillary     Status: Abnormal   Collection Time: 05/20/18  3:00 PM  Result Value Ref Range   Glucose-Capillary 168 (H) 70 - 99 mg/dL  Glucose, capillary     Status: Abnormal   Collection Time: 05/20/18  5:46 PM  Result Value Ref Range   Glucose-Capillary 111 (H) 70 - 99 mg/dL  Glucose, capillary     Status: Abnormal   Collection Time: 05/20/18  7:45 PM  Result Value Ref Range   Glucose-Capillary 234 (H) 70 - 99 mg/dL  Glucose, capillary     Status: Abnormal   Collection Time: 05/20/18  9:56 PM  Result Value Ref Range   Glucose-Capillary 198 (H) 70 - 99 mg/dL  Glucose, capillary     Status: Abnormal   Collection Time: 05/21/18  8:31 AM  Result Value Ref Range   Glucose-Capillary 103 (H) 70 - 99 mg/dL  Glucose, capillary     Status: Abnormal   Collection Time: 05/21/18 11:01 AM  Result Value Ref Range   Glucose-Capillary 221 (H) 70 - 99 mg/dL  Glucose, capillary     Status: None   Collection Time: 05/21/18  1:48 PM  Result Value Ref Range   Glucose-Capillary 75 70 - 99 mg/dL  Glucose, capillary     Status: Abnormal   Collection Time: 05/21/18  4:24 PM  Result Value Ref Range   Glucose-Capillary 190 (H) 70 - 99 mg/dL  Glucose, capillary     Status: Abnormal   Collection Time: 05/21/18  5:40 PM  Result Value Ref Range   Glucose-Capillary 153 (H) 70 - 99 mg/dL  Glucose, capillary     Status: Abnormal  Collection Time: 05/21/18  8:36 PM  Result Value Ref Range   Glucose-Capillary 192 (H) 70 - 99 mg/dL  Glucose, capillary     Status: Abnormal   Collection Time: 05/22/18  8:50 AM  Result Value Ref Range   Glucose-Capillary 123 (H) 70 - 99 mg/dL  Glucose, capillary     Status: Abnormal   Collection Time: 05/22/18 10:54 AM  Result Value Ref Range   Glucose-Capillary 216 (H) 70 - 99 mg/dL    No results found.  Current scheduled medications . docusate sodium   100 mg Oral Daily  . enoxaparin (LOVENOX) injection  60 mg Subcutaneous Q24H  . insulin aspart  0-32 Units Subcutaneous QID  . insulin aspart  20 Units Subcutaneous TID WC  . [START ON 05/23/2018] insulin NPH Human  30 Units Subcutaneous QAC breakfast  . insulin NPH Human  30 Units Subcutaneous QHS  . prenatal multivitamin  1 tablet Oral Q1200    I have reviewed the patient's current medications.  ASSESSMENT: Principal Problem:   Poorly controlled type 2 diabetes mellitus ([place]) Active Problems:   Maternal morbid obesity, 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[facility])   Asthma, mild persistent   Type 2 diabetes mellitus complicating pregnancy in second trimester, antepartum   Supervision of high risk pregnancy, antepartum   Rh negative state in antepartum period   PLAN: - CBGs all elevated except one -NPH increased to 30 units BID today - novolog increased to 20 units TID today - will add nebulizer Q 6 prn   Continue routine antenatal care.   Feliz Beam, M.D. Attending [facility] (Faculty Practice)  05/22/2018 11:35 AM

## 2018-05-23 LAB — TYPE AND SCREEN
ABO/RH(D): A NEG
Antibody Screen: POSITIVE
Unit division: 0
Unit division: 0

## 2018-05-23 LAB — GLUCOSE, CAPILLARY
GLUCOSE-CAPILLARY: 114 mg/dL — AB (ref 70–99)
Glucose-Capillary: 90 mg/dL (ref 70–99)
Glucose-Capillary: 155 mg/dL — ABNORMAL HIGH (ref 70–99)
Glucose-Capillary: 88 mg/dL (ref 70–99)
Glucose-Capillary: 183 mg/dL — ABNORMAL HIGH (ref 70–99)
Glucose-Capillary: 139 mg/dL — ABNORMAL HIGH (ref 70–99)
Glucose-Capillary: 142 mg/dL — ABNORMAL HIGH (ref 70–99)
Glucose-Capillary: 138 mg/dL — ABNORMAL HIGH (ref 70–99)

## 2018-05-23 LAB — BPAM RBC
BLOOD PRODUCT EXPIRATION DATE: 202003202359
Blood Product Expiration Date: 202003252359
Unit Type and Rh: 600
Unit Type and Rh: 600

## 2018-05-23 MED ORDER — INSULIN ASPART 100 UNIT/ML ~~LOC~~ SOLN
0.0000 [IU] | Freq: Three times a day (TID) | SUBCUTANEOUS | Status: DC
  Administered 2018-05-23: 8 [IU] via SUBCUTANEOUS
  Administered 2018-05-24: 3 [IU] via SUBCUTANEOUS

## 2018-05-23 NOTE — Progress Notes (Signed)
Inpatient Diabetes Program Recommendations  AACE/ADA: New Consensus Statement on Inpatient Glycemic Control (2015)  Target Ranges:  Prepandial:   less than 140 mg/dL      Peak postprandial:   less than 180 mg/dL (1-2 hours)      Critically ill patients:  140 - 180 mg/dL   Lab Results  Component Value Date   GLUCAP 155 (H) 05/23/2018   HGBA1C 5.8 (H) 01/12/2018    Review of Glycemic Control Results for ADRIAUNA, CAMPTON (MRN [id number]) as of 05/23/2018 11:40  Ref. Range 05/23/2018 00:18 05/23/2018 08:32 05/23/2018 11:12  Glucose-Capillary Latest Ref Range: 70 - 99 mg/dL 114 (H) 90 155 (H)   Diabetes history:DM2 Outpatient Diabetes medications:Humulin N 24 units am + 20 units hs + Humulin R 14 units bid ac meals Current orders for Inpatient glycemic control:Novolin N 30 units BID + Novolog 20 units tid meal coverage if eats 50% + Novolog 0-32 units QID   Inpatient Diabetes Program Recommendations:    Noted patient experienced hypoglycemia of 59 mg/dL on 3/16.   Feel that insulin adjustment yesterday was appropriate, however patient may be receiving too much correction based on current scale. Consider decreasing Novolog to 0-24 units TID (following meals 2 hours post prandially).   Will continue to follow.   Thanks, Bronson Curb, MSN, RNC-[facility] Diabetes Coordinator [phone] (8a-5p)

## 2018-05-23 NOTE — Progress Notes (Signed)
FACULTY PRACTICE ANTEPARTUM PROGRESS NOTE  Grace Montgomery is a 28 y.o. [phone] at [redacted]w[redacted]d who is admitted for uncontrolled T2DM.  Estimated Date of Delivery: 07/09/18 Fetal presentation is unsure.  Length of Stay:  7 Days. Admitted 05/16/2018  Subjective:  Patient reports normal fetal movement.  She denies uterine contractions, denies bleeding and leaking of fluid per vagina.  Vitals:  Blood pressure (!) 109/56, pulse 97, temperature 98.1 F (36.7 C), temperature source Oral, resp. rate 18, height 5\' 3"  (1.6 m), weight 118.8 kg, last menstrual period 10/02/2017, SpO2 100 %. Physical Examination: CONSTITUTIONAL: Well-developed, well-nourished female in no acute distress.  HENT:  Normocephalic, atraumatic, External right and left ear normal. Oropharynx is clear and moist EYES: Conjunctivae and EOM are normal. Pupils are equal, round, and reactive to light. No scleral icterus.  NECK: Normal range of motion, supple, no masses. SKIN: Skin is warm and dry. No rash noted. Not diaphoretic. No erythema. No pallor. [place]: Alert and oriented to person, place, and time. Normal reflexes, muscle tone coordination. No cranial nerve deficit noted. PSYCHIATRIC: Normal mood and affect. Normal behavior. Normal judgment and thought content. CARDIOVASCULAR: Normal heart rate noted, regular rhythm RESPIRATORY: Effort and breath sounds normal, no problems with respiration noted MUSCULOSKELETAL: Normal range of motion. No edema and no tenderness. ABDOMEN: Soft, nontender, nondistended, gravid. CERVIX: dferred  Fetal monitoring: FHR: 135 bpm, Variability: moderate, Accelerations: Present, Decelerations: Absent  Uterine activity: no contractions per hour   No results found.  Current scheduled medications . docusate sodium  100 mg Oral Daily  . enoxaparin (LOVENOX) injection  60 mg Subcutaneous Q24H  . insulin aspart  0-24 Units Subcutaneous TID WC  . insulin aspart  20 Units Subcutaneous TID WC  .  insulin NPH Human  30 Units Subcutaneous QAC breakfast  . insulin NPH Human  30 Units Subcutaneous QHS  . prenatal multivitamin  1 tablet Oral Q1200    I have reviewed the patient's current medications.  ASSESSMENT: Principal Problem:   Poorly controlled type 2 diabetes mellitus ([place]) Active Problems:   Maternal morbid obesity, 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[facility])   Asthma, mild persistent   Type 2 diabetes mellitus complicating pregnancy in second trimester, antepartum   Supervision of high risk pregnancy, antepartum   Rh negative state in antepartum period   PLAN: - Increased NPH and novolog with meals yesterday, will decrease correction scale per diabetes management, appreciate input - improved CBGs overnight - possible discharge home tomorrow - nebulizer prn   Continue routine antenatal care.   Feliz Beam, M.D. [facility]ending [facility] [employer] [facility] (Faculty Practice)  05/23/2018 12:53 PM

## 2018-05-24 ENCOUNTER — Telehealth: Payer: Self-pay | Admitting: *Deleted

## 2018-05-24 ENCOUNTER — Inpatient Hospital Stay (HOSPITAL_COMMUNITY): Payer: Medicaid Other

## 2018-05-24 ENCOUNTER — Telehealth: Payer: Self-pay

## 2018-05-24 DIAGNOSIS — O09213 Supervision of pregnancy with history of pre-term labor, third trimester: Secondary | ICD-10-CM

## 2018-05-24 DIAGNOSIS — J45909 Unspecified asthma, uncomplicated: Secondary | ICD-10-CM

## 2018-05-24 DIAGNOSIS — Z3A33 33 weeks gestation of pregnancy: Secondary | ICD-10-CM

## 2018-05-24 DIAGNOSIS — O9989 Other specified diseases and conditions complicating pregnancy, childbirth and the puerperium: Secondary | ICD-10-CM

## 2018-05-24 DIAGNOSIS — O99213 Obesity complicating pregnancy, third trimester: Secondary | ICD-10-CM

## 2018-05-24 DIAGNOSIS — O09293 Supervision of pregnancy with other poor reproductive or obstetric history, third trimester: Secondary | ICD-10-CM

## 2018-05-24 DIAGNOSIS — O24113 Pre-existing diabetes mellitus, type 2, in pregnancy, third trimester: Secondary | ICD-10-CM

## 2018-05-24 DIAGNOSIS — O99333 Smoking (tobacco) complicating pregnancy, third trimester: Secondary | ICD-10-CM

## 2018-05-24 DIAGNOSIS — O409XX0 Polyhydramnios, unspecified trimester, not applicable or unspecified: Secondary | ICD-10-CM

## 2018-05-24 LAB — GLUCOSE, CAPILLARY
Glucose-Capillary: 96 mg/dL (ref 70–99)
Glucose-Capillary: 119 mg/dL — ABNORMAL HIGH (ref 70–99)

## 2018-05-24 MED ORDER — INSULIN NPH (HUMAN) (ISOPHANE) 100 UNIT/ML ~~LOC~~ SUSP: 30.0000 [IU] | Freq: Every day | SUBCUTANEOUS | 11 refills | Status: DC

## 2018-05-24 MED ORDER — INSULIN ASPART 100 UNIT/ML ~~LOC~~ SOLN: 24.0000 [IU] | Freq: Three times a day (TID) | SUBCUTANEOUS | 11 refills | Status: DC

## 2018-05-24 MED ORDER — INSULIN ASPART 100 UNIT/ML ~~LOC~~ SOLN
0.0000 [IU] | Freq: Three times a day (TID) | SUBCUTANEOUS | Status: DC
  Administered 2018-05-24: 3 [IU] via SUBCUTANEOUS

## 2018-05-24 NOTE — Telephone Encounter (Signed)
Patient called stating she was transferred to the nurse, patient states she left a voicemail and she needs a call back right away. Please advise

## 2018-05-24 NOTE — Discharge Summary (Signed)
Antenatal Physician Discharge Summary  Patient ID: Grace Montgomery MRN: [id number] DOB/AGE: [birth year] 28 y.o.  Admit date: 05/16/2018 Discharge date: 05/24/2018  Admission Diagnoses: uncontrolled Type 2 DM  Discharge Diagnoses:  Principal Problem:   Poorly controlled type 2 diabetes mellitus ([place]) Active Problems:   Maternal morbid obesity, 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HCC)   Asthma, mild persistent   Type 2 diabetes mellitus complicating pregnancy in second trimester, antepartum   Supervision of high risk pregnancy, antepartum   Rh negative state in antepartum period   Polyhydramnios  Prenatal Procedures: NST and ultrasound  Consults: Maternal Fetal Medicine  Hospital Course:  This is a 28 y.o. [id number] with IUP at 26w3dadmitted for uncontrolled Type 2 DM. She was changed to novolog TID, addition to her NPH BID per diabetes management. Her insulin dosing was titrated until she was under much better control. Today, she reports normal fetal movement, denies leaking or bleeding, denies contractions. Overall, feeling well. Fetal heart tracing today reactive, BPP 10/10. New diagnosis of mild polyhydramnios on ultrasound today.  She was discharged home today in good condition, patient has appointment for growth and routine OB visit next week. She is to call with any issues.   Discharge Exam: Temp:  [97.5 F (36.4 C)-98.2 F (36.8 C)] 97.5 F (36.4 C) (03/18 0800) Pulse Rate:  [92-101] 101 (03/18 1200) Resp:  [18] 18 (03/18 1200) BP: (102-125)/(49-72) 108/60 (03/18 1200) SpO2:  [98 %-100 %] 100 % (03/18 1200) Physical Examination: CONSTITUTIONAL: Well-developed, well-nourished female in no acute distress.  HENT:  Normocephalic, atraumatic, External right and left ear normal. Oropharynx is clear and moist EYES: Conjunctivae and EOM are normal. Pupils are equal, round, and reactive to light. No scleral icterus.  NECK: Normal range of motion, supple, no masses SKIN: Skin is warm and dry. No rash  noted. Not diaphoretic. No erythema. No pallor. NTroy Alert and oriented to person, place, and time. Normal reflexes, muscle tone coordination. No cranial nerve deficit noted. PSYCHIATRIC: Normal mood and affect. Normal behavior. Normal judgment and thought content. CARDIOVASCULAR: Normal heart rate noted, regular rhythm RESPIRATORY: Effort and breath sounds normal, no problems with respiration noted MUSCULOSKELETAL: Normal range of motion. No edema and no tenderness. 2+ distal pulses. ABDOMEN: Soft, nontender, nondistended, gravid. CERVIX:  deferred  Fetal monitoring: FHR: 120 bpm, Variability: moderate, Accelerations: Present, Decelerations: Absent  Uterine activity: no contractions per hour  Significant Diagnostic Studies:  Results for orders placed or performed during the hospital encounter of 05/16/18 (from the past 168 hour(s))  Glucose, capillary   Collection Time: 05/17/18  3:08 PM  Result Value Ref Range   Glucose-Capillary 222 (H) 70 - 99 mg/dL  Glucose, capillary   Collection Time: 05/17/18  7:32 PM  Result Value Ref Range   Glucose-Capillary 76 70 - 99 mg/dL   Comment 1 Notify RN   Glucose, capillary   Collection Time: 05/17/18  9:39 PM  Result Value Ref Range   Glucose-Capillary 189 (H) 70 - 99 mg/dL   Comment 1 Notify RN   Glucose, capillary   Collection Time: 05/17/18 11:03 PM  Result Value Ref Range   Glucose-Capillary 243 (H) 70 - 99 mg/dL  Glucose, capillary   Collection Time: 05/18/18  8:39 AM  Result Value Ref Range   Glucose-Capillary 130 (H) 70 - 99 mg/dL  Glucose, capillary   Collection Time: 05/18/18 10:49 AM  Result Value Ref Range   Glucose-Capillary 186 (H) 70 - 99 mg/dL  Glucose, capillary   Collection Time: 05/18/18 12:13 PM  Result Value Ref Range   Glucose-Capillary 101 (H) 70 - 99 mg/dL  Glucose, capillary   Collection Time: 05/18/18  1:08 PM  Result Value Ref Range   Glucose-Capillary 75 70 - 99 mg/dL  Glucose, capillary    Collection Time: 05/18/18  2:59 PM  Result Value Ref Range   Glucose-Capillary 219 (H) 70 - 99 mg/dL  Glucose, capillary   Collection Time: 05/18/18  6:04 PM  Result Value Ref Range   Glucose-Capillary 171 (H) 70 - 99 mg/dL  Glucose, capillary   Collection Time: 05/18/18  8:39 PM  Result Value Ref Range   Glucose-Capillary 231 (H) 70 - 99 mg/dL  Glucose, capillary   Collection Time: 05/18/18 10:55 PM  Result Value Ref Range   Glucose-Capillary 217 (H) 70 - 99 mg/dL  Glucose, capillary   Collection Time: 05/19/18  8:38 AM  Result Value Ref Range   Glucose-Capillary 145 (H) 70 - 99 mg/dL  Type and screen [place]   Collection Time: 05/19/18  9:47 AM  Result Value Ref Range   ABO/RH(D) A NEG    Antibody Screen POS    Sample Expiration 05/22/2018    Antibody Identification PASSIVELY ACQUIRED ANTI-D    Unit Number [id number]    Blood Component Type RED CELLS,LR    Unit division 00    Status of Unit REL FROM [facility]    Transfusion Status OK TO TRANSFUSE    Crossmatch Result COMPATIBLE    Unit Number [id number]    Blood Component Type RBC LR PHER2    Unit division 00    Status of Unit REL FROM [facility]    Transfusion Status OK TO TRANSFUSE    Crossmatch Result COMPATIBLE   BPAM RBC   Collection Time: 05/19/18  9:47 AM  Result Value Ref Range   Blood Product Unit Number [id number]    Unit Type and Rh 0600    Blood Product Expiration Date 716967893810    Blood Product Unit Number [id number]    Unit Type and Rh 0600    Blood Product Expiration Date [id number]   Glucose, capillary   Collection Time: 05/19/18 10:41 AM  Result Value Ref Range   Glucose-Capillary 206 (H) 70 - 99 mg/dL  Glucose, capillary   Collection Time: 05/19/18 12:50 PM  Result Value Ref Range   Glucose-Capillary 89 70 - 99 mg/dL  Glucose, capillary   Collection Time: 05/19/18  3:03 PM  Result Value Ref Range   Glucose-Capillary 127 (H) 70 - 99 mg/dL  Glucose, capillary    Collection Time: 05/19/18  6:18 PM  Result Value Ref Range   Glucose-Capillary 74 70 - 99 mg/dL  Glucose, capillary   Collection Time: 05/19/18  8:23 PM  Result Value Ref Range   Glucose-Capillary 207 (H) 70 - 99 mg/dL  Glucose, capillary   Collection Time: 05/19/18 10:22 PM  Result Value Ref Range   Glucose-Capillary 186 (H) 70 - 99 mg/dL  Glucose, capillary   Collection Time: 05/20/18  9:20 AM  Result Value Ref Range   Glucose-Capillary 97 70 - 99 mg/dL  Glucose, capillary   Collection Time: 05/20/18 11:44 AM  Result Value Ref Range   Glucose-Capillary 149 (H) 70 - 99 mg/dL  Glucose, capillary   Collection Time: 05/20/18  3:00 PM  Result Value Ref Range   Glucose-Capillary 168 (H) 70 - 99 mg/dL  Glucose, capillary   Collection Time: 05/20/18  5:46 PM  Result Value Ref Range   Glucose-Capillary 111 (H)  70 - 99 mg/dL  Glucose, capillary   Collection Time: 05/20/18  7:45 PM  Result Value Ref Range   Glucose-Capillary 234 (H) 70 - 99 mg/dL  Glucose, capillary   Collection Time: 05/20/18  9:56 PM  Result Value Ref Range   Glucose-Capillary 198 (H) 70 - 99 mg/dL  Glucose, capillary   Collection Time: 05/21/18  8:31 AM  Result Value Ref Range   Glucose-Capillary 103 (H) 70 - 99 mg/dL  Glucose, capillary   Collection Time: 05/21/18 11:01 AM  Result Value Ref Range   Glucose-Capillary 221 (H) 70 - 99 mg/dL  Glucose, capillary   Collection Time: 05/21/18  1:48 PM  Result Value Ref Range   Glucose-Capillary 75 70 - 99 mg/dL  Glucose, capillary   Collection Time: 05/21/18  4:24 PM  Result Value Ref Range   Glucose-Capillary 190 (H) 70 - 99 mg/dL  Glucose, capillary   Collection Time: 05/21/18  5:40 PM  Result Value Ref Range   Glucose-Capillary 153 (H) 70 - 99 mg/dL  Glucose, capillary   Collection Time: 05/21/18  8:36 PM  Result Value Ref Range   Glucose-Capillary 192 (H) 70 - 99 mg/dL  Glucose, capillary   Collection Time: 05/22/18  8:50 AM  Result Value Ref Range    Glucose-Capillary 123 (H) 70 - 99 mg/dL  Glucose, capillary   Collection Time: 05/22/18 10:54 AM  Result Value Ref Range   Glucose-Capillary 216 (H) 70 - 99 mg/dL  Glucose, capillary   Collection Time: 05/22/18 11:54 AM  Result Value Ref Range   Glucose-Capillary 158 (H) 70 - 99 mg/dL  Glucose, capillary   Collection Time: 05/22/18  2:34 PM  Result Value Ref Range   Glucose-Capillary 181 (H) 70 - 99 mg/dL  Glucose, capillary   Collection Time: 05/22/18  5:58 PM  Result Value Ref Range   Glucose-Capillary 59 (L) 70 - 99 mg/dL  Glucose, capillary   Collection Time: 05/22/18  6:21 PM  Result Value Ref Range   Glucose-Capillary 106 (H) 70 - 99 mg/dL  Glucose, capillary   Collection Time: 05/22/18  7:57 PM  Result Value Ref Range   Glucose-Capillary 187 (H) 70 - 99 mg/dL  Glucose, capillary   Collection Time: 05/22/18  9:57 PM  Result Value Ref Range   Glucose-Capillary 158 (H) 70 - 99 mg/dL  Glucose, capillary   Collection Time: 05/23/18 12:18 AM  Result Value Ref Range   Glucose-Capillary 114 (H) 70 - 99 mg/dL  Glucose, capillary   Collection Time: 05/23/18  8:32 AM  Result Value Ref Range   Glucose-Capillary 90 70 - 99 mg/dL  Glucose, capillary   Collection Time: 05/23/18 11:12 AM  Result Value Ref Range   Glucose-Capillary 155 (H) 70 - 99 mg/dL  Glucose, capillary   Collection Time: 05/23/18 12:43 PM  Result Value Ref Range   Glucose-Capillary 88 70 - 99 mg/dL  Glucose, capillary   Collection Time: 05/23/18  3:16 PM  Result Value Ref Range   Glucose-Capillary 183 (H) 70 - 99 mg/dL  Glucose, capillary   Collection Time: 05/23/18  5:06 PM  Result Value Ref Range   Glucose-Capillary 139 (H) 70 - 99 mg/dL  Glucose, capillary   Collection Time: 05/23/18  7:31 PM  Result Value Ref Range   Glucose-Capillary 142 (H) 70 - 99 mg/dL   Comment 1 Notify RN    Comment 2 Document in Chart   Glucose, capillary   Collection Time: 05/23/18  9:55 PM  Result Value  Ref Range    Glucose-Capillary 138 (H) 70 - 99 mg/dL  Glucose, capillary   Collection Time: 05/24/18  8:27 AM  Result Value Ref Range   Glucose-Capillary 96 70 - 99 mg/dL  Glucose, capillary   Collection Time: 05/24/18 11:26 AM  Result Value Ref Range   Glucose-Capillary 119 (H) 70 - 99 mg/dL    Discharge Condition: Stable  Disposition: Discharge disposition: 01-Home or Self Care        Discharge Instructions    Notify physician for a general feeling that "something is not right"   Complete by:  As directed    Notify physician for increase or change in vaginal discharge   Complete by:  As directed    Notify physician for intestinal cramps, with or without diarrhea, sometimes described as "gas pain"   Complete by:  As directed    Notify physician for leaking of fluid   Complete by:  As directed    Notify physician for low, dull backache, unrelieved by heat or Tylenol   Complete by:  As directed    Notify physician for menstrual like cramps   Complete by:  As directed    Notify physician for pelvic pressure   Complete by:  As directed    Notify physician for uterine contractions.  These may be painless and feel like the uterus is tightening or the baby is  "balling up"   Complete by:  As directed    Notify physician for vaginal bleeding   Complete by:  As directed    PRETERM LABOR:  Includes any of the follwing symptoms that occur between 20 - [redacted] weeks gestation.  If these symptoms are not stopped, preterm labor can result in preterm delivery, placing your baby at risk   Complete by:  As directed      Allergies as of 05/24/2018      Reactions   Monistat [tioconazole] Swelling   Chocolate Itching, Other (See Comments)   Ears & throat itch   Ibuprofen Swelling   Swelling of the feet   Banana Itching, Other (See Comments)   Throat itching      Medication List    STOP taking these medications   insulin regular 100 units/mL injection Commonly known as:  HumuLIN R    terconazole 0.4 % vaginal cream Commonly known as:  TERAZOL 7     TAKE these medications   Accu-Chek FastClix Lancets Misc 1 each by Percutaneous route 4 (four) times daily.   Accu-Chek FastClix Lancets Misc 1 Units by Percutaneous route 4 (four) times daily.   Accu-Chek Guide w/Device Kit 1 Device by Does not apply route 4 (four) times daily.   acetaminophen 500 MG tablet Commonly known as:  TYLENOL Take 1 tablet (500 mg total) by mouth every 6 (six) hours as needed.   albuterol 108 (90 Base) MCG/ACT inhaler Commonly known as:  PROVENTIL HFA;VENTOLIN HFA Inhale 1-2 puffs into the lungs every 6 (six) hours as needed for wheezing or shortness of breath.   aspirin EC 81 MG tablet Take 1 tablet (81 mg total) by mouth daily. Take after 12 weeks for prevention of preeclampsia later in pregnancy   glucose blood test strip Commonly known as:  Accu-Chek Guide Use one test strip to check blood glucose 4 times daily.   insulin aspart 100 UNIT/ML injection Commonly known as:  novoLOG Inject 24 Units into the skin 3 (three) times daily with meals.   insulin NPH Human 100 UNIT/ML injection Commonly  known as:  HUMULIN N,NOVOLIN N Inject 0.3 mLs (30 Units total) into the skin at bedtime. What changed:  You were already taking a medication with the same name, and this prescription was added. Make sure you understand how and when to take each.   insulin NPH Human 100 UNIT/ML injection Commonly known as:  HUMULIN N,NOVOLIN N Inject 0.3 mLs (30 Units total) into the skin daily before breakfast. Start taking on:  May 25, 2018 What changed:    how much to take  how to take this  when to take this  additional instructions   pantoprazole 20 MG tablet Commonly known as:  Protonix Take 1 tablet (20 mg total) by mouth 2 (two) times daily.   Prenate Pixie 10-0.6-0.4-200 MG Caps Take 1 capsule by mouth daily.      Follow-up Information    [facility].  Go on 05/30/2018.   Specialty:  Obstetrics and Gynecology Contact information: [address] [place] [phone]          Signed: Feliz Beam, M.D. Attending [facility] [employer] (Faculty Practice)  05/24/2018, 1:49 PM

## 2018-05-24 NOTE — Progress Notes (Signed)
Inpatient Diabetes Program Recommendations  AACE/ADA: New Consensus Statement on Inpatient Glycemic Control (2015)  Target Ranges:  Prepandial:   less than 140 mg/dL      Peak postprandial:   less than 180 mg/dL (1-2 hours)      Critically ill patients:  140 - 180 mg/dL   Lab Results  Component Value Date   GLUCAP 138 (H) 05/23/2018   HGBA1C 5.8 (H) 01/12/2018    Review of Glycemic Control Results for JOYCE, HEITMAN (MRN [id number]) as of 05/24/2018 07:47  Ref. Range 05/23/2018 15:16 05/23/2018 17:06 05/23/2018 19:31 05/23/2018 21:55  Glucose-Capillary Latest Ref Range: 70 - 99 mg/dL 183 (H) 139 (H) 142 (H) 138 (H)   Diabetes history:DM2 Outpatient Diabetes medications:Humulin N 24 units am + 20 units hs + Humulin R 14 units bid ac meals Current orders for Inpatient glycemic control:Novolin N 30units BID + Novolog 20 units tid meal coverage if eats 50% + Novolog 0-24 units TID  Inpatient Diabetes Program Recommendations:  In the event patient is discharge, would consider continuing with NPH 30 units BID and increase meal coverage to Novolog 24 units TID (assuming that patient is consuming >50% of meals and CBG >80 mg/dL).  Thanks, Bronson Curb, MSN, RNC-OB Diabetes Coordinator [phone] (8a-5p)

## 2018-05-24 NOTE — Telephone Encounter (Signed)
S/w pt and she stated that her new rx for insulin will not be available until sometime tomorrow. Advised pt that per Dr. Elly Modena, she should continue the insulin regimen that she has been using until the new one arrives. Pt agreed.

## 2018-05-24 NOTE — Discharge Instructions (Signed)
Diabetes Mellitus and Nutrition, Adult When you have diabetes (diabetes mellitus), it is very important to have healthy eating habits because your blood sugar (glucose) levels are greatly affected by what you eat and drink. Eating healthy foods in the appropriate amounts, at about the same times every day, can help you:  Control your blood glucose.  Lower your risk of heart disease.  Improve your blood pressure.  Reach or maintain a healthy weight. Every person with diabetes is different, and each person has different needs for a meal plan. Your health care provider may recommend that you work with a diet and nutrition specialist (dietitian) to make a meal plan that is best for you. Your meal plan may vary depending on factors such as:  The calories you need.  The medicines you take.  Your weight.  Your blood glucose, blood pressure, and cholesterol levels.  Your activity level.  Other health conditions you have, such as heart or kidney disease. How do carbohydrates affect me? Carbohydrates, also called carbs, affect your blood glucose level more than any other type of food. Eating carbs naturally raises the amount of glucose in your blood. Carb counting is a method for keeping track of how many carbs you eat. Counting carbs is important to keep your blood glucose at a healthy level, especially if you use insulin or take certain oral diabetes medicines. It is important to know how many carbs you can safely have in each meal. This is different for every person. Your dietitian can help you calculate how many carbs you should have at each meal and for each snack. Foods that contain carbs include:  Bread, cereal, rice, pasta, and crackers.  Potatoes and corn.  Peas, beans, and lentils.  Milk and yogurt.  Fruit and juice.  Desserts, such as cakes, cookies, ice cream, and candy. How does alcohol affect me? Alcohol can cause a sudden decrease in blood glucose (hypoglycemia),  especially if you use insulin or take certain oral diabetes medicines. Hypoglycemia can be a life-threatening condition. Symptoms of hypoglycemia (sleepiness, dizziness, and confusion) are similar to symptoms of having too much alcohol. If your health care provider says that alcohol is safe for you, follow these guidelines:  Limit alcohol intake to no more than 1 drink per day for nonpregnant women and 2 drinks per day for men. One drink equals 12 oz of beer, 5 oz of wine, or 1 oz of hard liquor.  Do not drink on an empty stomach.  Keep yourself hydrated with water, diet soda, or unsweetened iced tea.  Keep in mind that regular soda, juice, and other mixers may contain a lot of sugar and must be counted as carbs. What are tips for following this plan?  Reading food labels  Start by checking the serving size on the "Nutrition Facts" label of packaged foods and drinks. The amount of calories, carbs, fats, and other nutrients listed on the label is based on one serving of the item. Many items contain more than one serving per package.  Check the total grams (g) of carbs in one serving. You can calculate the number of servings of carbs in one serving by dividing the total carbs by 15. For example, if a food has 30 g of total carbs, it would be equal to 2 servings of carbs.  Check the number of grams (g) of saturated and trans fats in one serving. Choose foods that have low or no amount of these fats.  Check the number of  milligrams (mg) of salt (sodium) in one serving. Most people should limit total sodium intake to less than 2,300 mg per day.  Always check the nutrition information of foods labeled as "low-fat" or "nonfat". These foods may be higher in added sugar or refined carbs and should be avoided.  Talk to your dietitian to identify your daily goals for nutrients listed on the label. Shopping  Avoid buying canned, premade, or processed foods. These foods tend to be high in fat, sodium,  and added sugar.  Shop around the outside edge of the grocery store. This includes fresh fruits and vegetables, bulk grains, fresh meats, and fresh dairy. Cooking  Use low-heat cooking methods, such as baking, instead of high-heat cooking methods like deep frying.  Cook using healthy oils, such as olive, canola, or sunflower oil.  Avoid cooking with butter, cream, or high-fat meats. Meal planning  Eat meals and snacks regularly, preferably at the same times every day. Avoid going long periods of time without eating.  Eat foods high in fiber, such as fresh fruits, vegetables, beans, and whole grains. Talk to your dietitian about how many servings of carbs you can eat at each meal.  Eat 4-6 ounces (oz) of lean protein each day, such as lean meat, chicken, fish, eggs, or tofu. One oz of lean protein is equal to: ? 1 oz of meat, chicken, or fish. ? 1 egg. ?  cup of tofu.  Eat some foods each day that contain healthy fats, such as avocado, nuts, seeds, and fish. Lifestyle  Check your blood glucose regularly.  Exercise regularly as told by your health care provider. This may include: ? 150 minutes of moderate-intensity or vigorous-intensity exercise each week. This could be brisk walking, biking, or water aerobics. ? Stretching and doing strength exercises, such as yoga or weightlifting, at least 2 times a week.  Take medicines as told by your health care provider.  Do not use any products that contain nicotine or tobacco, such as cigarettes and e-cigarettes. If you need help quitting, ask your health care provider.  Work with a Social worker or diabetes educator to identify strategies to manage stress and any emotional and social challenges. Questions to ask a health care provider  Do I need to meet with a diabetes educator?  Do I need to meet with a dietitian?  What number can I call if I have questions?  When are the best times to check my blood glucose? Where to find more  information:  American Diabetes Association: diabetes.org  Academy of Nutrition and Dietetics: www.eatright.[employer] of Diabetes and Digestive and Kidney Diseases (NIH): [employer] Summary  A healthy meal plan will help you control your blood glucose and maintain a healthy lifestyle.  Working with a diet and nutrition specialist (dietitian) can help you make a meal plan that is best for you.  Keep in mind that carbohydrates (carbs) and alcohol have immediate effects on your blood glucose levels. It is important to count carbs and to use alcohol carefully. This information is not intended to replace advice given to you by your health care provider. Make sure you discuss any questions you have with your health care provider. Document Released: 11/19/2004 Document Revised: 09/22/2016 Document Reviewed: 03/29/2016 Elsevier Interactive Patient Education  2019 [place].   Blood Glucose Monitoring, Adult Monitoring your blood sugar (glucose) is an important part of managing your diabetes (diabetes mellitus). Blood glucose monitoring involves checking your blood glucose as often as directed and keeping  a record (log) of your results over time. Checking your blood glucose regularly and keeping a blood glucose log can:  Help you and your health care provider adjust your diabetes management plan as needed, including your medicines or insulin.  Help you understand how food, exercise, illnesses, and medicines affect your blood glucose.  Let you know what your blood glucose is at any time. You can quickly find out if you have low blood glucose (hypoglycemia) or high blood glucose (hyperglycemia). Your health care provider will set individualized treatment goals for you. Your goals will be based on your age, other medical conditions you have, and how you respond to diabetes treatment. Generally, the goal of treatment is to maintain the following blood glucose levels:  Before  meals (preprandial): 80-130 mg/dL (4.4-7.2 mmol/L).  After meals (postprandial): below 180 mg/dL (10 mmol/L).  A1c level: less than 7%. Supplies needed:  Blood glucose meter.  Test strips for your meter. Each meter has its own strips. You must use the strips that came with your meter.  A needle to prick your finger (lancet). Do not use a lancet more than one time.  A device that holds the lancet (lancing device).  A journal or log book to write down your results. How to check your blood glucose  1. Wash your hands with soap and water. 2. Prick the side of your finger (not the tip) with the lancet. Use a different finger each time. 3. Gently rub the finger until a small drop of blood appears. 4. Follow instructions that come with your meter for inserting the test strip, applying blood to the strip, and using your blood glucose meter. 5. Write down your result and any notes. Some meters allow you to use areas of your body other than your finger (alternative sites) to test your blood. The most common alternative sites are:  Forearm.  Thigh.  Palm of the hand. If you think you may have hypoglycemia, or if you have a history of not knowing when your blood glucose is getting low (hypoglycemia unawareness), do not use alternative sites. Use your finger instead. Alternative sites may not be as accurate as the fingers, because blood flow is slower in these areas. This means that the result you get may be delayed, and it may be different from the result that you would get from your finger. Follow these instructions at home: Blood glucose log   Every time you check your blood glucose, write down your result. Also write down any notes about things that may be affecting your blood glucose, such as your diet and exercise for the day. This information can help you and your health care provider: ? Look for patterns in your blood glucose over time. ? Adjust your diabetes management plan as  needed.  Check if your meter allows you to download your records to a computer. Most glucose meters store a record of glucose readings in the meter. If you have type 1 diabetes:  Check your blood glucose 2 or more times a day.  Also check your blood glucose: ? Before every insulin injection. ? Before and after exercise. ? Before meals. ? 2 hours after a meal. ? Occasionally between 2:00 a.m. and 3:00 a.m., as directed. ? Before potentially dangerous tasks, like driving or using heavy machinery. ? At bedtime.  You may need to check your blood glucose more often, up to 6-10 times a day, if you: ? Use an insulin pump. ? Need multiple daily injections (  MDI). ? Have diabetes that is not well-controlled. ? Are ill. ? Have a history of severe hypoglycemia. ? Have hypoglycemia unawareness. If you have type 2 diabetes:  If you take insulin or other diabetes medicines, check your blood glucose 2 or more times a day.  If you are on intensive insulin therapy, check your blood glucose 4 or more times a day. Occasionally, you may also need to check between 2:00 a.m. and 3:00 a.m., as directed.  Also check your blood glucose: ? Before and after exercise. ? Before potentially dangerous tasks, like driving or using heavy machinery.  You may need to check your blood glucose more often if: ? Your medicine is being adjusted. ? Your diabetes is not well-controlled. ? You are ill. General tips  Always keep your supplies with you.  If you have questions or need help, all blood glucose meters have a 24-hour "hotline" phone number that you can call. You may also contact your health care provider.  After you use a few boxes of test strips, adjust (calibrate) your blood glucose meter by following instructions that came with your meter. Contact a health care provider if:  Your blood glucose is at or above 240 mg/dL (13.3 mmol/L) for 2 days in a row.  You have been sick or have had a fever for 2  days or longer, and you are not getting better.  You have any of the following problems for more than 6 hours: ? You cannot eat or drink. ? You have nausea or vomiting. ? You have diarrhea. Get help right away if:  Your blood glucose is lower than 54 mg/dL (3 mmol/L).  You become confused or you have trouble thinking clearly.  You have difficulty breathing.  You have moderate or large ketone levels in your urine. Summary  Monitoring your blood sugar (glucose) is an important part of managing your diabetes (diabetes mellitus).  Blood glucose monitoring involves checking your blood glucose as often as directed and keeping a record (log) of your results over time.  Your health care provider will set individualized treatment goals for you. Your goals will be based on your age, other medical conditions you have, and how you respond to diabetes treatment.  Every time you check your blood glucose, write down your result. Also write down any notes about things that may be affecting your blood glucose, such as your diet and exercise for the day. This information is not intended to replace advice given to you by your health care provider. Make sure you discuss any questions you have with your health care provider. Document Released: 02/25/2003 Document Revised: 01/03/2017 Document Reviewed: 08/04/2015 Elsevier Interactive Patient Education  2019 [employer].

## 2018-05-29 ENCOUNTER — Telehealth: Payer: Self-pay

## 2018-05-29 NOTE — Telephone Encounter (Signed)
Called patient - advised of Prenatal vitamin backorder - samples upfront ready for pickup.  Patient advised she just received a bottle of prenatal vitamins in the mail. Advised she can pick up the samples tomorrow, 05/30/18 at her ROB visit to make sure she doesn't run out. Patient stated she understood.

## 2018-05-30 ENCOUNTER — Ambulatory Visit (INDEPENDENT_AMBULATORY_CARE_PROVIDER_SITE_OTHER): Payer: Medicaid Other | Admitting: Obstetrics and Gynecology

## 2018-05-30 ENCOUNTER — Other Ambulatory Visit: Payer: Self-pay

## 2018-05-30 ENCOUNTER — Encounter: Payer: Self-pay | Admitting: Obstetrics and Gynecology

## 2018-05-30 VITALS — BP 102/70 | HR 108 | Wt 264.9 lb

## 2018-05-30 DIAGNOSIS — O24112 Pre-existing diabetes mellitus, type 2, in pregnancy, second trimester: Secondary | ICD-10-CM

## 2018-05-30 DIAGNOSIS — O360930 Maternal care for other rhesus isoimmunization, third trimester, not applicable or unspecified: Secondary | ICD-10-CM

## 2018-05-30 DIAGNOSIS — Z3A34 34 weeks gestation of pregnancy: Secondary | ICD-10-CM

## 2018-05-30 DIAGNOSIS — O24113 Pre-existing diabetes mellitus, type 2, in pregnancy, third trimester: Secondary | ICD-10-CM

## 2018-05-30 DIAGNOSIS — Z6791 Unspecified blood type, Rh negative: Secondary | ICD-10-CM

## 2018-05-30 DIAGNOSIS — O26899 Other specified pregnancy related conditions, unspecified trimester: Secondary | ICD-10-CM

## 2018-05-30 DIAGNOSIS — O0990 Supervision of high risk pregnancy, unspecified, unspecified trimester: Secondary | ICD-10-CM

## 2018-05-30 MED ORDER — GLUCOSE BLOOD VI STRP: ORAL_STRIP | 12 refills | Status: DC

## 2018-05-30 NOTE — Progress Notes (Signed)
   PRENATAL VISIT NOTE  Subjective:  Grace Montgomery is a 28 y.o. [phone] at [redacted]w[redacted]d being seen today for ongoing prenatal care.  She is currently monitored for the following issues for this high-risk pregnancy and has Major depressive disorder; Maternal morbid obesity, 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[place]); Asthma, mild persistent; MDD (major depressive disorder), recurrent severe, without psychosis ([place]); Type 2 diabetes mellitus complicating pregnancy in second trimester, antepartum; Supervision of high risk pregnancy, antepartum; Rh negative state in antepartum period; Poorly controlled type 2 diabetes mellitus ([place]); and Polyhydramnios on their problem list.  Patient reports no complaints.  Contractions: Not present. Vag. Bleeding: None.  Movement: Present. Denies leaking of fluid.   The following portions of the patient's history were reviewed and updated as appropriate: allergies, current medications, past family history, past medical history, past social history, past surgical history and problem list.   Objective:   Vitals:   05/30/18 1344  BP: 102/70  Pulse: (!) 108  Weight: 264 lb 14.4 oz (120.2 kg)    Fetal Status:     Movement: Present     General:  Alert, oriented and cooperative. Patient is in no acute distress.  Skin: Skin is warm and dry. No rash noted.   Cardiovascular: Normal heart rate noted  Respiratory: Normal respiratory effort, no problems with respiration noted  Abdomen: Soft, gravid, appropriate for gestational age.  Pain/Pressure: Present     Pelvic: Cervical exam deferred        Extremities: Normal range of motion.  Edema: Trace  Mental Status: Normal mood and affect. Normal behavior. Normal judgment and thought content.   Assessment and Plan:  Pregnancy: [id number] at [redacted]w[redacted]d 1. Supervision of high risk pregnancy, antepartum Patient is doing well Reports poor food resources at [facility]  2. Type 2 diabetes mellitus complicating pregnancy in second trimester, antepartum  CBGs reviewed and consistent with recent hospitalization withf asting in low 100's pp values mainly in 130's with 2 values in the 200's.  NST today with baseline 130, mod variability, + accels, no decels BPP scheduled for this week  Growth ultrasound ordered  3. Rh negative state in antepartum period S/p rhogam  Preterm labor symptoms and general obstetric precautions including but not limited to vaginal bleeding, contractions, leaking of fluid and fetal movement were reviewed in detail with the patient. Please refer to After Visit Summary for other counseling recommendations.   No follow-ups on file.  No future appointments.  Mora Bellman, MD

## 2018-05-30 NOTE — Progress Notes (Signed)
Patient reports good fetal movement, with pelvic pressure, denies contractions.

## 2018-06-05 ENCOUNTER — Ambulatory Visit (HOSPITAL_COMMUNITY)
Admission: RE | Admit: 2018-06-05 | Discharge: 2018-06-05 | Disposition: A | Discharge status: Home / Self Care | Payer: Medicaid Other | Source: Ambulatory Visit | Attending: Obstetrics and Gynecology | Admitting: Obstetrics and Gynecology

## 2018-06-05 ENCOUNTER — Other Ambulatory Visit: Payer: Self-pay

## 2018-06-05 ENCOUNTER — Ambulatory Visit (HOSPITAL_COMMUNITY): Payer: Medicaid Other

## 2018-06-05 DIAGNOSIS — O9989 Other specified diseases and conditions complicating pregnancy, childbirth and the puerperium: Secondary | ICD-10-CM | POA: Diagnosis not present

## 2018-06-05 DIAGNOSIS — O09213 Supervision of pregnancy with history of pre-term labor, third trimester: Secondary | ICD-10-CM

## 2018-06-05 DIAGNOSIS — O24112 Pre-existing diabetes mellitus, type 2, in pregnancy, second trimester: Secondary | ICD-10-CM | POA: Diagnosis present

## 2018-06-05 DIAGNOSIS — J45909 Unspecified asthma, uncomplicated: Secondary | ICD-10-CM

## 2018-06-05 DIAGNOSIS — O24113 Pre-existing diabetes mellitus, type 2, in pregnancy, third trimester: Secondary | ICD-10-CM

## 2018-06-05 DIAGNOSIS — O09293 Supervision of pregnancy with other poor reproductive or obstetric history, third trimester: Secondary | ICD-10-CM

## 2018-06-05 DIAGNOSIS — O99333 Smoking (tobacco) complicating pregnancy, third trimester: Secondary | ICD-10-CM

## 2018-06-05 DIAGNOSIS — Z3A35 35 weeks gestation of pregnancy: Secondary | ICD-10-CM

## 2018-06-05 DIAGNOSIS — O99213 Obesity complicating pregnancy, third trimester: Secondary | ICD-10-CM

## 2018-06-06 ENCOUNTER — Ambulatory Visit (INDEPENDENT_AMBULATORY_CARE_PROVIDER_SITE_OTHER): Payer: Medicaid Other | Admitting: Obstetrics and Gynecology

## 2018-06-06 ENCOUNTER — Encounter: Payer: Self-pay | Admitting: Obstetrics and Gynecology

## 2018-06-06 DIAGNOSIS — O0993 Supervision of high risk pregnancy, unspecified, third trimester: Secondary | ICD-10-CM

## 2018-06-06 DIAGNOSIS — O26899 Other specified pregnancy related conditions, unspecified trimester: Secondary | ICD-10-CM

## 2018-06-06 DIAGNOSIS — O26893 Other specified pregnancy related conditions, third trimester: Secondary | ICD-10-CM

## 2018-06-06 DIAGNOSIS — O99210 Obesity complicating pregnancy, unspecified trimester: Secondary | ICD-10-CM

## 2018-06-06 DIAGNOSIS — O24112 Pre-existing diabetes mellitus, type 2, in pregnancy, second trimester: Secondary | ICD-10-CM

## 2018-06-06 DIAGNOSIS — O24113 Pre-existing diabetes mellitus, type 2, in pregnancy, third trimester: Secondary | ICD-10-CM

## 2018-06-06 DIAGNOSIS — O0990 Supervision of high risk pregnancy, unspecified, unspecified trimester: Secondary | ICD-10-CM

## 2018-06-06 DIAGNOSIS — O99213 Obesity complicating pregnancy, third trimester: Secondary | ICD-10-CM

## 2018-06-06 DIAGNOSIS — O360930 Maternal care for other rhesus isoimmunization, third trimester, not applicable or unspecified: Secondary | ICD-10-CM

## 2018-06-06 DIAGNOSIS — Z3A35 35 weeks gestation of pregnancy: Secondary | ICD-10-CM

## 2018-06-06 DIAGNOSIS — Z6791 Unspecified blood type, Rh negative: Secondary | ICD-10-CM

## 2018-06-06 NOTE — Progress Notes (Signed)
   TELEHEALTH VIRTUAL OBSTETRICS VISIT ENCOUNTER NOTE  I connected with Grace Montgomery on 06/06/18 at  1:45 PM EDT by telephone at home and verified that I am speaking with the correct person using two identifiers.   I discussed the limitations, risks, security and privacy concerns of performing an evaluation and management service by telephone and the availability of in person appointments. I also discussed with the patient that there may be a patient responsible charge related to this service. The patient expressed understanding and agreed to proceed.  Subjective:  Grace Montgomery is a 28 y.o. [phone] at [redacted]w[redacted]d being followed for ongoing prenatal care.  She is currently monitored for the following issues for this high-risk pregnancy and has Major depressive disorder; Maternal morbid obesity, 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[place]); Asthma, mild persistent; MDD (major depressive disorder), recurrent severe, without psychosis ([place]); Type 2 diabetes mellitus complicating pregnancy in second trimester, antepartum; Supervision of high risk pregnancy, antepartum; Rh negative state in antepartum period; Poorly controlled type 2 diabetes mellitus ([place]); and Polyhydramnios on their problem list.  Patient reports no complaints. Reports fetal movement. Denies any contractions, bleeding or leaking of fluid.   The following portions of the patient's history were reviewed and updated as appropriate: allergies, current medications, past family history, past medical history, past social history, past surgical history and problem list.   Objective:   General:  Alert, oriented and cooperative.   Mental Status: Normal mood and affect perceived. Normal judgment and thought content.  Rest of physical exam deferred due to type of encounter  Assessment and Plan:  Pregnancy: [id number] at [redacted]w[redacted]d 1. Supervision of high risk pregnancy, antepartum Patient is doing well reporting some intermittent pelvic pressure Cultures next visit  2. Rh  negative state in antepartum period S/p rhogam  3. Type 2 diabetes mellitus complicating pregnancy in second trimester, antepartum Patient has not been checking CBGs since her last visit She is scheduled for an ultrasound on 4/6   4. Maternal morbid obesity, 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[place])   Preterm labor symptoms and general obstetric precautions including but not limited to vaginal bleeding, contractions, leaking of fluid and fetal movement were reviewed in detail with the patient.  I discussed the assessment and treatment plan with the patient. The patient was provided an opportunity to ask questions and all were answered. The patient agreed with the plan and demonstrated an understanding of the instructions. The patient was advised to call back or seek an in-person office evaluation/go to [facility] at [facility] for any urgent or concerning symptoms. Please refer to After Visit Summary for other counseling recommendations.   I provided 15 minutes of non-face-to-face time during this encounter.  Return in about 1 week (around 06/13/2018) for ROB in person for cultures.  Future Appointments  Date Time Provider [place]  06/12/2018  2:00 PM [place] [facility]  06/12/2018  2:00 PM [place] [facility]    Mora Bellman, MD [facility], [address]

## 2018-06-12 ENCOUNTER — Other Ambulatory Visit: Payer: Self-pay | Admitting: Obstetrics and Gynecology

## 2018-06-12 ENCOUNTER — Other Ambulatory Visit: Payer: Self-pay

## 2018-06-12 ENCOUNTER — Ambulatory Visit (HOSPITAL_COMMUNITY)
Admission: RE | Admit: 2018-06-12 | Discharge: 2018-06-12 | Disposition: A | Discharge status: Home / Self Care | Payer: Medicaid Other | Source: Ambulatory Visit | Attending: Obstetrics and Gynecology | Admitting: Obstetrics and Gynecology

## 2018-06-12 ENCOUNTER — Ambulatory Visit (HOSPITAL_COMMUNITY): Payer: Medicaid Other | Admitting: *Deleted

## 2018-06-12 DIAGNOSIS — Z6791 Unspecified blood type, Rh negative: Secondary | ICD-10-CM

## 2018-06-12 DIAGNOSIS — O9989 Other specified diseases and conditions complicating pregnancy, childbirth and the puerperium: Secondary | ICD-10-CM | POA: Diagnosis not present

## 2018-06-12 DIAGNOSIS — O09213 Supervision of pregnancy with history of pre-term labor, third trimester: Secondary | ICD-10-CM

## 2018-06-12 DIAGNOSIS — O26899 Other specified pregnancy related conditions, unspecified trimester: Secondary | ICD-10-CM

## 2018-06-12 DIAGNOSIS — O24112 Pre-existing diabetes mellitus, type 2, in pregnancy, second trimester: Secondary | ICD-10-CM | POA: Diagnosis not present

## 2018-06-12 DIAGNOSIS — O99213 Obesity complicating pregnancy, third trimester: Secondary | ICD-10-CM

## 2018-06-12 DIAGNOSIS — O24113 Pre-existing diabetes mellitus, type 2, in pregnancy, third trimester: Secondary | ICD-10-CM

## 2018-06-12 DIAGNOSIS — Z362 Encounter for other antenatal screening follow-up: Secondary | ICD-10-CM

## 2018-06-12 DIAGNOSIS — O09293 Supervision of pregnancy with other poor reproductive or obstetric history, third trimester: Secondary | ICD-10-CM | POA: Diagnosis not present

## 2018-06-12 DIAGNOSIS — J45909 Unspecified asthma, uncomplicated: Secondary | ICD-10-CM

## 2018-06-12 DIAGNOSIS — O409XX0 Polyhydramnios, unspecified trimester, not applicable or unspecified: Secondary | ICD-10-CM | POA: Insufficient documentation

## 2018-06-12 DIAGNOSIS — Z3A36 36 weeks gestation of pregnancy: Secondary | ICD-10-CM

## 2018-06-12 DIAGNOSIS — O99333 Smoking (tobacco) complicating pregnancy, third trimester: Secondary | ICD-10-CM

## 2018-06-13 ENCOUNTER — Ambulatory Visit (INDEPENDENT_AMBULATORY_CARE_PROVIDER_SITE_OTHER): Payer: Medicaid Other | Admitting: Obstetrics & Gynecology

## 2018-06-13 ENCOUNTER — Other Ambulatory Visit (HOSPITAL_COMMUNITY)
Admission: RE | Admit: 2018-06-13 | Discharge: 2018-06-13 | Disposition: A | Discharge status: Home / Self Care | Payer: Medicaid Other | Source: Ambulatory Visit | Attending: Obstetrics & Gynecology | Admitting: Obstetrics & Gynecology

## 2018-06-13 VITALS — BP 104/69 | HR 114 | Wt 269.2 lb

## 2018-06-13 DIAGNOSIS — O0990 Supervision of high risk pregnancy, unspecified, unspecified trimester: Secondary | ICD-10-CM

## 2018-06-13 DIAGNOSIS — O409XX0 Polyhydramnios, unspecified trimester, not applicable or unspecified: Secondary | ICD-10-CM

## 2018-06-13 DIAGNOSIS — Z3A36 36 weeks gestation of pregnancy: Secondary | ICD-10-CM

## 2018-06-13 DIAGNOSIS — O403XX0 Polyhydramnios, third trimester, not applicable or unspecified: Secondary | ICD-10-CM

## 2018-06-13 NOTE — Patient Instructions (Signed)
Labor Induction  Labor induction is when steps are taken to cause a pregnant woman to begin the labor process. Most women go into labor on their own between 37 weeks and 42 weeks of pregnancy. When this does not happen or when there is a medical need for labor to begin, steps may be taken to induce labor. Labor induction causes a pregnant woman's uterus to contract. It also causes the cervix to soften (ripen), open (dilate), and thin out (efface). Usually, labor is not induced before 39 weeks of pregnancy unless there is a medical reason to do so. Your health care provider will determine if labor induction is needed. Before inducing labor, your health care provider will consider a number of factors, including:  Your medical condition and your baby's.  How many weeks along you are in your pregnancy.  How mature your baby's lungs are.  The condition of your cervix.  The position of your baby.  The size of your birth canal. What are some reasons for labor induction? Labor may be induced if:  Your health or your baby's health is at risk.  Your pregnancy is overdue by 1 week or more.  Your water breaks but labor does not start on its own.  There is a low amount of amniotic fluid around your baby. You may also choose (elect) to have labor induced at a certain time. Generally, elective labor induction is done no earlier than 39 weeks of pregnancy. What methods are used for labor induction? Methods used for labor induction include:  Prostaglandin medicine. This medicine starts contractions and causes the cervix to dilate and ripen. It can be taken by mouth (orally) or by being inserted into the vagina (suppository).  Inserting a small, thin tube (catheter) with a balloon into the vagina and then expanding the balloon with water to dilate the cervix.  Stripping the membranes. In this method, your health care provider gently separates amniotic sac tissue from the cervix. This causes the  cervix to stretch, which in turn causes the release of a hormone called progesterone. The hormone causes the uterus to contract. This procedure is often done during an office visit, after which you will be sent home to wait for contractions to begin.  Breaking the water. In this method, your health care provider uses a small instrument to make a small hole in the amniotic sac. This eventually causes the amniotic sac to break. Contractions should begin after a few hours.  Medicine to trigger or strengthen contractions. This medicine is given through an IV that is inserted into a vein in your arm. Except for membrane stripping, which can be done in a clinic, labor induction is done in the hospital so that you and your baby can be carefully monitored. How long does it take for labor to be induced? The length of time it takes to induce labor depends on how ready your body is for labor. Some inductions can take up to 2-3 days, while others may take less than a day. Induction may take longer if:  You are induced early in your pregnancy.  It is your first pregnancy.  Your cervix is not ready. What are some risks associated with labor induction? Some risks associated with labor induction include:  Changes in fetal heart rate, such as being too high, too low, or irregular (erratic).  Failed induction.  Infection in the mother or the baby.  Increased risk of having a cesarean delivery.  Fetal death.  Breaking off (abruption)   high, too low, or irregular (erratic).  · Failed induction.  · Infection in the mother or the baby.  · Increased risk of having a cesarean delivery.  · Fetal death.  · Breaking off (abruption) of the placenta from the uterus (rare).  · Rupture of the uterus (very rare).  When induction is needed for medical reasons, the benefits of induction generally outweigh the risks.  What are some reasons for not inducing labor?  Labor induction should not be done if:  · Your baby does not tolerate contractions.  · You have had previous surgeries on your uterus, such as a myomectomy, removal of fibroids, or a vertical scar from a previous cesarean delivery.  · Your placenta lies  very low in your uterus and blocks the opening of the cervix (placenta previa).  · Your baby is not in a head-down position.  · The umbilical cord drops down into the birth canal in front of the baby.  · There are unusual circumstances, such as the baby being very early (premature).  · You have had more than 2 previous cesarean deliveries.  Summary  · Labor induction is when steps are taken to cause a pregnant woman to begin the labor process.  · Labor induction causes a pregnant woman's uterus to contract. It also causes the cervix to ripen, dilate, and efface.  · Labor is not induced before 39 weeks of pregnancy unless there is a medical reason to do so.  · When induction is needed for medical reasons, the benefits of induction generally outweigh the risks.  This information is not intended to replace advice given to you by your health care provider. Make sure you discuss any questions you have with your health care provider.  Document Released: 07/14/2006 Document Revised: 04/07/2016 Document Reviewed: 04/07/2016  Elsevier Interactive Patient Education © 2019 [employer]

## 2018-06-13 NOTE — Progress Notes (Signed)
ROB GBS 

## 2018-06-13 NOTE — Progress Notes (Signed)
   PRENATAL VISIT NOTE  Subjective:  Grace Montgomery is a 28 y.o. [phone] at [redacted]w[redacted]d being seen today for ongoing prenatal care.  She is currently monitored for the following issues for this high-risk pregnancy and has Major depressive disorder; Maternal morbid obesity, 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[place]); Asthma, mild persistent; MDD (major depressive disorder), recurrent severe, without psychosis ([place]); Type 2 diabetes mellitus complicating pregnancy in third trimester, antepartum; Supervision of high risk pregnancy, antepartum; Rh negative state in antepartum period; Poorly controlled type 2 diabetes mellitus ([place]); and Polyhydramnios on their problem list.  Patient reports no complaints.  Contractions: Not present. Vag. Bleeding: None.  Movement: Present. Denies leaking of fluid.   The following portions of the patient's history were reviewed and updated as appropriate: allergies, current medications, past family history, past medical history, past social history, past surgical history and problem list.   Objective:   Vitals:   06/13/18 1456  BP: 104/69  Pulse: (!) 114  Weight: 269 lb 3.2 oz (122.1 kg)    Fetal Status: Fetal Heart Rate (bpm): 131   Movement: Present     General:  Alert, oriented and cooperative. Patient is in no acute distress.  Skin: Skin is warm and dry. No rash noted.   Cardiovascular: Normal heart rate noted  Respiratory: Normal respiratory effort, no problems with respiration noted  Abdomen: Soft, gravid, appropriate for gestational age.  Pain/Pressure: Present     Pelvic: Cervical exam performed        Extremities: Normal range of motion.  Edema: None  Mental Status: Normal mood and affect. Normal behavior. Normal judgment and thought content.   Assessment and Plan:  Pregnancy: [id number] at [redacted]w[redacted]d 1. Polyhydramnios, antepartum, single or unspecified fetus For IOL 37 weeks per Dr.Shankar  2. Supervision of high risk pregnancy, antepartum BPP 8/8 yesterday  Preterm labor  symptoms and general obstetric precautions including but not limited to vaginal bleeding, contractions, leaking of fluid and fetal movement were reviewed in detail with the patient. Please refer to After Visit Summary for other counseling recommendations.   Return in about 4 weeks (around 07/11/2018) for postpartum.  No future appointments.  Emeterio Reeve, MD

## 2018-06-14 ENCOUNTER — Encounter (HOSPITAL_COMMUNITY): Payer: Self-pay | Admitting: *Deleted

## 2018-06-14 ENCOUNTER — Other Ambulatory Visit: Payer: Self-pay | Admitting: Family Medicine

## 2018-06-14 ENCOUNTER — Telehealth (HOSPITAL_COMMUNITY): Payer: Self-pay | Admitting: *Deleted

## 2018-06-14 LAB — CERVICOVAGINAL ANCILLARY ONLY
Chlamydia: NEGATIVE
Neisseria Gonorrhea: NEGATIVE

## 2018-06-14 NOTE — Telephone Encounter (Signed)
Preadmission screen  

## 2018-06-14 NOTE — Progress Notes (Signed)
Induction orders placed based on last cervical check. Patient's GBS is pending. No antibiotics ordered. Will need orders on admission if GBS positive.   Aura Camps, MD 1:44 AM 06/14/18

## 2018-06-15 ENCOUNTER — Other Ambulatory Visit (HOSPITAL_COMMUNITY): Payer: Self-pay | Admitting: Advanced Practice Midwife

## 2018-06-15 LAB — STREP GP B NAA: Strep Gp B NAA: NEGATIVE

## 2018-06-16 ENCOUNTER — Other Ambulatory Visit (HOSPITAL_COMMUNITY): Payer: Self-pay | Admitting: *Deleted

## 2018-06-18 ENCOUNTER — Inpatient Hospital Stay (HOSPITAL_COMMUNITY): Payer: Medicaid Other | Admitting: Anesthesiology

## 2018-06-18 ENCOUNTER — Inpatient Hospital Stay (HOSPITAL_COMMUNITY)
Admission: AD | Admit: 2018-06-18 | Discharge: 2018-06-22 | DRG: 786 | Disposition: A | Discharge status: Home / Self Care | Payer: Medicaid Other | Attending: Obstetrics & Gynecology | Admitting: Obstetrics & Gynecology

## 2018-06-18 ENCOUNTER — Encounter (HOSPITAL_COMMUNITY): Payer: Self-pay | Admitting: General Practice

## 2018-06-18 ENCOUNTER — Inpatient Hospital Stay (HOSPITAL_COMMUNITY): Payer: Medicaid Other

## 2018-06-18 ENCOUNTER — Other Ambulatory Visit: Payer: Self-pay

## 2018-06-18 DIAGNOSIS — E1165 Type 2 diabetes mellitus with hyperglycemia: Secondary | ICD-10-CM | POA: Diagnosis present

## 2018-06-18 DIAGNOSIS — Z6791 Unspecified blood type, Rh negative: Secondary | ICD-10-CM | POA: Diagnosis not present

## 2018-06-18 DIAGNOSIS — O2412 Pre-existing diabetes mellitus, type 2, in childbirth: Secondary | ICD-10-CM | POA: Diagnosis present

## 2018-06-18 DIAGNOSIS — O403XX0 Polyhydramnios, third trimester, not applicable or unspecified: Secondary | ICD-10-CM | POA: Diagnosis present

## 2018-06-18 DIAGNOSIS — Z98891 History of uterine scar from previous surgery: Secondary | ICD-10-CM

## 2018-06-18 DIAGNOSIS — Z87891 Personal history of nicotine dependence: Secondary | ICD-10-CM | POA: Diagnosis not present

## 2018-06-18 DIAGNOSIS — Z3A37 37 weeks gestation of pregnancy: Secondary | ICD-10-CM

## 2018-06-18 DIAGNOSIS — E119 Type 2 diabetes mellitus without complications: Secondary | ICD-10-CM | POA: Diagnosis present

## 2018-06-18 DIAGNOSIS — O26899 Other specified pregnancy related conditions, unspecified trimester: Secondary | ICD-10-CM

## 2018-06-18 DIAGNOSIS — Z880 Allergy status to penicillin: Secondary | ICD-10-CM | POA: Diagnosis not present

## 2018-06-18 DIAGNOSIS — O99344 Other mental disorders complicating childbirth: Secondary | ICD-10-CM | POA: Diagnosis present

## 2018-06-18 DIAGNOSIS — Z794 Long term (current) use of insulin: Secondary | ICD-10-CM

## 2018-06-18 DIAGNOSIS — O99214 Obesity complicating childbirth: Secondary | ICD-10-CM | POA: Diagnosis present

## 2018-06-18 DIAGNOSIS — R630 Anorexia: Secondary | ICD-10-CM | POA: Diagnosis present

## 2018-06-18 DIAGNOSIS — F319 Bipolar disorder, unspecified: Secondary | ICD-10-CM | POA: Diagnosis present

## 2018-06-18 DIAGNOSIS — O26893 Other specified pregnancy related conditions, third trimester: Secondary | ICD-10-CM | POA: Diagnosis present

## 2018-06-18 DIAGNOSIS — O24113 Pre-existing diabetes mellitus, type 2, in pregnancy, third trimester: Secondary | ICD-10-CM | POA: Diagnosis present

## 2018-06-18 DIAGNOSIS — O409XX0 Polyhydramnios, unspecified trimester, not applicable or unspecified: Secondary | ICD-10-CM

## 2018-06-18 HISTORY — DX: History of uterine scar from previous surgery: Z98.891

## 2018-06-18 LAB — GLUCOSE, CAPILLARY
Glucose-Capillary: 493 mg/dL — ABNORMAL HIGH (ref 70–99)
Glucose-Capillary: 240 mg/dL — ABNORMAL HIGH (ref 70–99)
Glucose-Capillary: 189 mg/dL — ABNORMAL HIGH (ref 70–99)
Glucose-Capillary: 193 mg/dL — ABNORMAL HIGH (ref 70–99)
Glucose-Capillary: 155 mg/dL — ABNORMAL HIGH (ref 70–99)
Glucose-Capillary: 128 mg/dL — ABNORMAL HIGH (ref 70–99)
Glucose-Capillary: 105 mg/dL — ABNORMAL HIGH (ref 70–99)
Glucose-Capillary: 113 mg/dL — ABNORMAL HIGH (ref 70–99)
Glucose-Capillary: 192 mg/dL — ABNORMAL HIGH (ref 70–99)
Glucose-Capillary: 193 mg/dL — ABNORMAL HIGH (ref 70–99)
Glucose-Capillary: 170 mg/dL — ABNORMAL HIGH (ref 70–99)
Glucose-Capillary: 133 mg/dL — ABNORMAL HIGH (ref 70–99)
Glucose-Capillary: 105 mg/dL — ABNORMAL HIGH (ref 70–99)

## 2018-06-18 LAB — CBC
HCT: 35.9 % — ABNORMAL LOW (ref 36.0–46.0)
Hemoglobin: 12.1 g/dL (ref 12.0–15.0)
MCH: 31.8 pg (ref 26.0–34.0)
MCHC: 33.7 g/dL (ref 30.0–36.0)
MCV: 94.5 fL (ref 80.0–100.0)
Platelets: 287 10*3/uL (ref 150–400)
RBC: 3.8 MIL/uL — ABNORMAL LOW (ref 3.87–5.11)
RDW: 14 % (ref 11.5–15.5)
WBC: 8.3 10*3/uL (ref 4.0–10.5)
nRBC: 0 % (ref 0.0–0.2)

## 2018-06-18 MED ORDER — TERBUTALINE SULFATE 1 MG/ML IJ SOLN
0.2500 mg | Freq: Once | INTRAMUSCULAR | Status: AC | PRN
  Administered 2018-06-19: 01:00:00 0.25 mg via SUBCUTANEOUS

## 2018-06-18 MED ORDER — LACTATED RINGERS IV SOLN
INTRAVENOUS | Status: DC
  Administered 2018-06-18 – 2018-06-19 (×4): via INTRAVENOUS

## 2018-06-18 MED ORDER — EPHEDRINE 5 MG/ML INJ: 10.0000 mg | INTRAVENOUS | Status: DC | PRN

## 2018-06-18 MED ORDER — FENTANYL-BUPIVACAINE-NACL 0.5-0.125-0.9 MG/250ML-% EP SOLN: 12.0000 mL/h | EPIDURAL | Status: DC | PRN

## 2018-06-18 MED ORDER — INSULIN REGULAR(HUMAN) IN NACL 100-0.9 UT/100ML-% IV SOLN
INTRAVENOUS | Status: DC
  Administered 2018-06-18: 1.8 [IU]/h via INTRAVENOUS
  Filled 2018-06-18 (×2): qty 100

## 2018-06-18 MED ORDER — DIPHENHYDRAMINE HCL 50 MG/ML IJ SOLN: 12.5000 mg | INTRAMUSCULAR | Status: DC | PRN

## 2018-06-18 MED ORDER — SODIUM CHLORIDE 0.9 % IV SOLN
INTRAVENOUS | Status: DC
  Administered 2018-06-18 – 2018-06-19 (×2): via INTRAVENOUS

## 2018-06-18 MED ORDER — OXYTOCIN 40 UNITS IN NORMAL SALINE INFUSION - SIMPLE MED
2.5000 [IU]/h | INTRAVENOUS | Status: DC
  Filled 2018-06-18: qty 1000

## 2018-06-18 MED ORDER — MISOPROSTOL 25 MCG QUARTER TABLET: 25.0000 ug | ORAL_TABLET | ORAL | Status: DC | PRN

## 2018-06-18 MED ORDER — ONDANSETRON HCL 4 MG/2ML IJ SOLN: 4.0000 mg | Freq: Four times a day (QID) | INTRAMUSCULAR | Status: DC | PRN

## 2018-06-18 MED ORDER — OXYTOCIN BOLUS FROM INFUSION: 500.0000 mL | Freq: Once | INTRAVENOUS | Status: DC

## 2018-06-18 MED ORDER — LACTATED RINGERS IV SOLN
500.0000 mL | Freq: Once | INTRAVENOUS | Status: AC
  Administered 2018-06-18: 500 mL via INTRAVENOUS

## 2018-06-18 MED ORDER — PHENYLEPHRINE 40 MCG/ML (10ML) SYRINGE FOR IV PUSH (FOR BLOOD PRESSURE SUPPORT): 80.0000 ug | PREFILLED_SYRINGE | INTRAVENOUS | Status: DC | PRN

## 2018-06-18 MED ORDER — DEXTROSE-NACL 5-0.45 % IV SOLN
INTRAVENOUS | Status: DC
  Administered 2018-06-18: 17:00:00 via INTRAVENOUS

## 2018-06-18 MED ORDER — SODIUM CHLORIDE 0.9 % IV SOLN: INTRAVENOUS | Status: DC

## 2018-06-18 MED ORDER — SOD CITRATE-CITRIC ACID 500-334 MG/5ML PO SOLN: 30.0000 mL | ORAL | Status: DC | PRN

## 2018-06-18 MED ORDER — PHENYLEPHRINE 40 MCG/ML (10ML) SYRINGE FOR IV PUSH (FOR BLOOD PRESSURE SUPPORT)
80.0000 ug | PREFILLED_SYRINGE | INTRAVENOUS | Status: DC | PRN
  Administered 2018-06-19 (×2): 80 ug via INTRAVENOUS

## 2018-06-18 MED ORDER — MISOPROSTOL 50MCG HALF TABLET
50.0000 ug | ORAL_TABLET | ORAL | Status: DC | PRN
  Administered 2018-06-18 (×2): 50 ug via BUCCAL
  Filled 2018-06-18 (×2): qty 1

## 2018-06-18 MED ORDER — DEXTROSE 50 % IV SOLN: 25.0000 mL | INTRAVENOUS | Status: DC | PRN

## 2018-06-18 MED ORDER — INSULIN REGULAR BOLUS VIA INFUSION
0.0000 [IU] | Freq: Three times a day (TID) | INTRAVENOUS | Status: DC
  Administered 2018-06-18: 4.5 [IU] via INTRAVENOUS
  Administered 2018-06-18: 5.5 [IU] via INTRAVENOUS
  Filled 2018-06-18: qty 10

## 2018-06-18 MED ORDER — ACETAMINOPHEN 325 MG PO TABS: 650.0000 mg | ORAL_TABLET | ORAL | Status: DC | PRN

## 2018-06-18 MED ORDER — LACTATED RINGERS IV SOLN: 500.0000 mL | INTRAVENOUS | Status: DC | PRN

## 2018-06-18 MED ORDER — OXYTOCIN 40 UNITS IN NORMAL SALINE INFUSION - SIMPLE MED
1.0000 m[IU]/min | INTRAVENOUS | Status: DC
  Administered 2018-06-18: 19:00:00 2 m[IU]/min via INTRAVENOUS

## 2018-06-18 MED ORDER — TERBUTALINE SULFATE 1 MG/ML IJ SOLN
0.2500 mg | Freq: Once | INTRAMUSCULAR | Status: DC | PRN
  Filled 2018-06-18: qty 1

## 2018-06-18 MED ORDER — LIDOCAINE HCL (PF) 1 % IJ SOLN
30.0000 mL | INTRAMUSCULAR | Status: DC | PRN
  Filled 2018-06-18: qty 30

## 2018-06-18 MED ORDER — FENTANYL CITRATE (PF) 100 MCG/2ML IJ SOLN
100.0000 ug | INTRAMUSCULAR | Status: DC | PRN
  Administered 2018-06-18 (×2): 100 ug via INTRAVENOUS
  Filled 2018-06-18 (×2): qty 2

## 2018-06-18 NOTE — Progress Notes (Signed)
Labor Progress Note Grace Montgomery is a 28 y.o. [phone] at [redacted]w[redacted]d presented for IOL for uncontrolled type 2 diabetes  S:  Patient more uncomfortable with contractions. SROM with clear fluid.  O:  BP 134/74   Pulse 79   Temp 97.6 F (36.4 C) (Axillary)   Resp 16   Ht 5\' 3"  (1.6 m)   Wt 122.1 kg   LMP 10/02/2017 (Exact Date)   BMI 47.69 kg/m   Fetal Tracing:  Baseline: 130 Variability: moderate Accels: 15x15 Decels: early  Toco: 1-3   CVE: Dilation: 3.5 Effacement (%): 70 Cervical Position: Posterior Station: -2 Presentation: Vertex Exam by:: Haynes Bast, CNM   A&P: 28 y.o. [id number] [redacted]w[redacted]d IOL for diabetes #Labor: Progressing well. Continue current plan of care #Pain: IV  #FWB: Cat 1 #GBS negative  Wende Mott, CNM 11:05 PM

## 2018-06-18 NOTE — Progress Notes (Signed)
Grace Montgomery is a 28 y.o. [phone] at [redacted]w[redacted]d admitted for induction of labor due to Diabetes, Class B. Pt has poor control, which is reason for the IOL.  Pt has not checked her CBG's since last office visit last Monday, blaiming the glucometer as 'retarded". Pt did take her 30 NPH this a.m. along with 23 units Novolog, with her breakfast , which consisted of pancakes with 'regular" syrup and a large glass of orange juice. Pt was placed on insulin glucose stabilizer on arrival, and now has responded promptly to her elevated CBG's after the glucose stabilizer together with her insulin from this morning's dosing.   Subjective: Pt unable to abduct hips to allow easy exam, refuses to consider foley bulb placement. Pt refuses to allow FB placement , found gyn exam excessively uncomfortable. Hx rapid IOL in past ,reported by pt as lasting 12 hrs.   Objective: BP 109/72   Pulse 98   Temp 98.2 F (36.8 C) (Oral)   Resp 16   Ht 5\' 3"  (1.6 m)   Wt 122.1 kg   LMP 10/02/2017 (Exact Date)   BMI 47.69 kg/m  No intake/output data recorded. No intake/output data recorded.  FHT:  FHR: 135 bpm, variability: moderate,  accelerations:  Present,  decelerations:  Absent UC:   none SVE:   Dilation: 1.5 Effacement (%): 50 Station: Ballotable Exam by:: [employer]  Labs: Lab Results  Component Value Date   WBC 8.3 06/18/2018   HGB 12.1 06/18/2018   HCT 35.9 (L) 06/18/2018   MCV 94.5 06/18/2018   PLT 287 06/18/2018    Assessment / Plan: Induction of labor due to poorly controlled DM,  progressing well on pitocin correction/cytotec  Labor: continue cytotec, attempt foley bulb under fentanyl analgesia, if pt allows. Preeclampsia:   Fetal Wellbeing:  Category I Pain Control:  IV pain meds I/D:  n/a Anticipated MOD:  NSVD  Jonnie Kind 06/18/2018, 2:23 PM

## 2018-06-18 NOTE — Progress Notes (Signed)
Grace Montgomery and Grace Montgomery to bedside to discuss concerns about masking. Pt and visitor agreed to wear mask at this time Grace Montgomery

## 2018-06-18 NOTE — Anesthesia Preprocedure Evaluation (Signed)
Anesthesia Evaluation  Patient identified by MRN, date of birth, ID band Patient awake    Reviewed: Allergy & Precautions, NPO status , Patient's Chart, lab work & pertinent test results  History of Anesthesia Complications Negative for: history of anesthetic complications  Airway Mallampati: II  TM Distance: >3 FB Neck ROM: Full    Dental   Pulmonary asthma , former smoker,    breath sounds clear to auscultation       Cardiovascular negative cardio ROS   Rhythm:Regular Rate:Normal     Neuro/Psych PSYCHIATRIC DISORDERS Anxiety Depression Bipolar Disorder negative neurological ROS     GI/Hepatic negative GI ROS, (+)     substance abuse  marijuana use,   Endo/Other  diabetes, Type 2, Insulin DependentMorbid obesity  Renal/GU negative Renal ROS     Musculoskeletal negative musculoskeletal ROS (+)   Abdominal (+) + obese,   Peds  Hematology negative hematology ROS (+)   Anesthesia Other Findings   Reproductive/Obstetrics (+) Pregnancy                             Anesthesia Physical Anesthesia Plan  ASA: III  Anesthesia Plan: Epidural   Post-op Pain Management:    Induction:   PONV Risk Score and Plan: 2 and Treatment may vary due to age or medical condition  Airway Management Planned: Natural Airway  Additional Equipment: None  Intra-op Plan:   Post-operative Plan:   Informed Consent: I have reviewed the patients History and Physical, chart, labs and discussed the procedure including the risks, benefits and alternatives for the proposed anesthesia with the patient or authorized representative who has indicated his/her understanding and acceptance.       Plan Discussed with: Anesthesiologist  Anesthesia Plan Comments: (Labs reviewed. Platelets acceptable, patient not taking any blood thinning medications. Per RN, FHR tracing reported to be stable enough for sitting  procedure. Risks and benefits discussed with patient, including PDPH, backache, epidural hematoma, failed epidural, allergic reaction, and nerve injury. Patient expressed understanding and wished to proceed.)        Anesthesia Quick Evaluation

## 2018-06-18 NOTE — Progress Notes (Signed)
Grace Montgomery is a 28 y.o. [phone] at [redacted]w[redacted]d admitted for induction of labor due to poorly controlled DM2.  Subjective:  No complaints. Not feeling any contractions at this time.   Objective: BP 109/72   Pulse 98   Temp 98.2 F (36.8 C) (Oral)   Resp 16   Ht 5\' 3"  (1.6 m)   Wt 122.1 kg   LMP 10/02/2017 (Exact Date)   BMI 47.69 kg/m  No intake/output data recorded. No intake/output data recorded.  FHT:  FHR: 125 bpm, variability: moderate,  accelerations:  Present,  decelerations:  Absent UC:   none SVE:   Dilation: 1.5 Effacement (%): 50 Station: Ballotable Exam by:: [employer]  Labs: Lab Results  Component Value Date   WBC 8.3 06/18/2018   HGB 12.1 06/18/2018   HCT 35.9 (L) 06/18/2018   MCV 94.5 06/18/2018   PLT 287 06/18/2018    Assessment / Plan: IOL, cervical change from closed to 1.5 with one dose of cytotec.   Labor: Progressing normally Preeclampsia:  NA Fetal Wellbeing:  Category I Pain Control:  Labor support without medications I/D:  n/a Anticipated MOD:  NSVD   DW patient about placing FB for labor induction. She has a very difficult time tolerating exams. Offered to give patient a dose of pain medication prior to placing FB to help her be more comfortable with the process. Patient states that she would like pain medication, but she does not consent to FB placement at this time. Will proceed with 2nd dose of cytotec and then possibly start pitocin in approx 4 hours depending on progress at that time.   Marcille Buffy DNP, CNM  06/18/18  2:29 PM

## 2018-06-18 NOTE — H&P (Addendum)
Grace Montgomery is a 28 y.o. female presenting for IOL 2/2 poorly controlled T2DM and polyhydramnios.  OB History    Gravida  5   Para  3   Term  0   Preterm  3   AB  1   Living  3     SAB  1   TAB  0   Ectopic  0   Multiple  0   Live Births  3          Past Medical History:  Diagnosis Date  . Anxiety   . Asthma   . Asthma, mild persistent 06/29/2011  . Bipolar disorder ([place])   . Cannabis dependence   . Diabetes mellitus without complication ([place])   . 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 Seasonal allergies 06/29/2011   On zyrtec OTC     Past Surgical History:  Procedure Laterality Date  . NO PAST SURGERIES     Family History: family history includes Depression in her mother; Diabetes in her mother and another family member; Sickle cell anemia in her mother; Stroke in an other family member. Social History:  reports that she has quit smoking. Her smoking use included cigarettes. She smoked 0.25 packs per day. She has never used smokeless tobacco. She reports previous alcohol use. She reports previous drug use.   Nursing Staff Provider  Office Location  [facility] Dating  [place] and 9.1 week [place] 12/08/18  Language  english Anatomy US   02/13/18  Flu Vaccine  Declined 01/12/18 Genetic Screen  NIPS: low risk  AFP: neg    TDaP vaccine   04/19/2018 Hgb A1C or  GTT Early  Third trimester   Rhogam  04/19/2018   LAB RESULTS   Feeding Plan both Blood Type --/--/A NEG (03/13 0947) A NEG   Contraception  condoms Antibody POS (03/13 0947)NEG  Circumcision  yes if female Rubella 1.89 (11/07 1343) Immune  Pediatrician  Undecided RPR Non Reactive (03/10 1625)   Support Person  FOB HBsAg Negative (11/07 1343) NEG  Prenatal Classes  HIV Non Reactive (02/12 1619)  BTL Consent  GBS  (For PCN allergy, check sensitivities)   VBAC Consent  Pap  01/12/18 BV otherwise normal    Hgb Electro      CF  Carrier. Asked partner next visit    SMA  3 copies low  risk    Waterbirth  [ ]  Class [ ]  Consent [ ]  CNM visit      Maternal Diabetes: Yes:  Diabetes Type:  Pre-pregnancy, Insulin/Medication controlled Genetic Screening: Normal Maternal Ultrasounds/Referrals: Abnormal:  Findings:   Other: polyhydramnios  Fetal Ultrasounds or other Referrals:  Fetal echo Maternal Substance Abuse:  No Significant Maternal Medications:  None Significant Maternal Lab Results:  None Other Comments:  None  Review of Systems  All other systems reviewed and are negative.  Maternal Medical History:  Contractions: Frequency: rare.   Perceived severity is mild.    Fetal activity: Perceived fetal activity is normal.   Last perceived fetal movement was within the past hour.    Prenatal complications: Polyhydramnios.   Prenatal Complications - Diabetes: type 2. Diabetes is managed by insulin injections.      Dilation: Closed Effacement (%): Thick Station: Ballotable Exam by:: [employer] Blood pressure 128/61, pulse (!) 103, temperature 97.9 F (36.6 C), temperature source Oral, resp. rate 16, last menstrual period 10/02/2017.   Maternal Exam:  Abdomen: Patient reports no abdominal tenderness.  Introitus: Normal vulva. Normal vagina.  Pelvis: adequate for delivery.   Cervix: Cervix evaluated by digital exam.     Fetal Exam Fetal Monitor Review: Mode: ultrasound.   Baseline rate: 135.  Variability: moderate (6-25 bpm).   Pattern: accelerations present and no decelerations.    Fetal State Assessment: Category I - tracings are normal.     Physical Exam  Nursing note and vitals reviewed. Constitutional: She is oriented to person, place, and time. She appears well-developed and well-nourished. No distress.  HENT:  Head: Normocephalic.  Cardiovascular: Normal rate.  Respiratory: Effort normal.  GI: Soft. There is no abdominal tenderness.  Genitourinary:    Vulva normal.   Neurological: She is alert and oriented to person, place, and time.  Skin:  Skin is warm and dry.  Psychiatric: She has a normal mood and affect.    Pt informed that the ultrasound is considered a limited OB ultrasound and is not intended to be a complete ultrasound exam.  Patient also informed that the ultrasound is not being completed with the intent of assessing for fetal or placental anomalies or any pelvic abnormalities.  Explained that the purpose of today's ultrasound is to assess for  presentation.  Patient acknowledges the purpose of the exam and the limitations of the study.    Vertex, but head is out of the pelvis   Glucose by fingerstick on admission is 493. Patient states that she took insulin as directed today. She also reports that she had pancakes with syrup and orange juice for breakfast.   Prenatal labs: ABO, Rh: A Negative, Rhogam given at 28 weeks  Antibody: PENDING (04/12 0826) Rubella: 1.89 (11/07 1343) RPR: Non Reactive (03/10 1625)  HBsAg: Negative (11/07 1343)  HIV: Non Reactive (02/12 1619)  GBS: Negative (04/07 0329)   Assessment/Plan: 28 y.o. [id number] at [redacted]w[redacted]d   Patient Active Problem List   Diagnosis Date Noted  . Encounter for induction of labor 06/18/2018  . Polyhydramnios 05/24/2018  . Poorly controlled type 2 diabetes mellitus ([place]) 05/16/2018  . Rh negative state in antepartum period 03/02/2018  . Supervision of high risk pregnancy, antepartum 01/12/2018  . Type 2 diabetes mellitus complicating pregnancy in third trimester, antepartum 01/05/2018  . MDD (major depressive disorder), recurrent severe, without psychosis ([place]) 02/16/2017  . Maternal morbid obesity, 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[place]) 06/29/2011  . Asthma, mild persistent 06/29/2011  . Major depressive disorder 04/26/2011    Class: Acute   Admit to labor and delivery  Cytotec for cervical ripening  Move to pitocin as needed Anticipate NSVD  Glucose stabilizer orders placed Dr. Glo Herring updated on patient status     Marcille Buffy DNP, CNM  06/18/18  9:20 AM

## 2018-06-18 NOTE — Progress Notes (Signed)
Grace Montgomery is a 28 y.o. [phone] at [redacted]w[redacted]d  admitted for induction of labor due to Diabetes.  Subjective:  No complaints. No concerns at this time.   Objective: BP 132/84   Pulse (!) 109   Temp 97.6 F (36.4 C) (Axillary)   Resp 16   Ht 5\' 3"  (1.6 m)   Wt 122.1 kg   LMP 10/02/2017 (Exact Date)   BMI 47.69 kg/m  No intake/output data recorded. No intake/output data recorded.  FHT:  FHR: 135 bpm, variability: moderate,  accelerations:  Present,  decelerations:  Absent UC:   irregular, every 2-10 minutes SVE:   Dilation: 2 Effacement (%): 50 Station: -2 Exam by:: [employer]  Labs: Lab Results  Component Value Date   WBC 8.3 06/18/2018   HGB 12.1 06/18/2018   HCT 35.9 (L) 06/18/2018   MCV 94.5 06/18/2018   PLT 287 06/18/2018    Assessment / Plan: IOL, still in latent phase   Labor: latent phase  Preeclampsia:  NA Fetal Wellbeing:  Category I Pain Control:  Labor support without medications I/D:  n/a Anticipated MOD:  NSVD   Discussed with patient the option for FB at this time, and rationale behind why this is a good option at this time. Patient does not consent to use of FB at this time. She states, "I want to do this natural with pitocin". Cervix has softened and thinned. Will start pitocin infusion 2x2.   Marcille Buffy DNP, CNM  06/18/18  6:23 PM

## 2018-06-19 ENCOUNTER — Encounter (HOSPITAL_COMMUNITY)
Admission: AD | Disposition: A | Discharge status: Home / Self Care | Payer: Self-pay | Source: Home / Self Care | Attending: Obstetrics & Gynecology

## 2018-06-19 ENCOUNTER — Encounter (HOSPITAL_COMMUNITY): Payer: Self-pay

## 2018-06-19 DIAGNOSIS — O99214 Obesity complicating childbirth: Secondary | ICD-10-CM

## 2018-06-19 DIAGNOSIS — O2412 Pre-existing diabetes mellitus, type 2, in childbirth: Secondary | ICD-10-CM

## 2018-06-19 DIAGNOSIS — Z794 Long term (current) use of insulin: Secondary | ICD-10-CM

## 2018-06-19 DIAGNOSIS — Z3A37 37 weeks gestation of pregnancy: Secondary | ICD-10-CM

## 2018-06-19 DIAGNOSIS — O403XX0 Polyhydramnios, third trimester, not applicable or unspecified: Secondary | ICD-10-CM

## 2018-06-19 LAB — GLUCOSE, CAPILLARY
Glucose-Capillary: 129 mg/dL — ABNORMAL HIGH (ref 70–99)
Glucose-Capillary: 245 mg/dL — ABNORMAL HIGH (ref 70–99)
Glucose-Capillary: 248 mg/dL — ABNORMAL HIGH (ref 70–99)
Glucose-Capillary: 249 mg/dL — ABNORMAL HIGH (ref 70–99)
Glucose-Capillary: 79 mg/dL (ref 70–99)

## 2018-06-19 LAB — RPR: RPR Ser Ql: NONREACTIVE

## 2018-06-19 LAB — CREATININE, SERUM
Creatinine, Ser: 0.8 mg/dL (ref 0.44–1.00)
GFR calc Af Amer: >60 mL/min (ref >60)
GFR calc non Af Amer: >60 mL/min (ref >60)

## 2018-06-19 SURGERY — Surgical Case
Anesthesia: Epidural

## 2018-06-19 MED ORDER — SODIUM CHLORIDE 0.9 % IR SOLN
Status: DC | PRN
  Administered 2018-06-19: 1000 mL

## 2018-06-19 MED ORDER — HYDROMORPHONE HCL 2 MG PO TABS: 2.0000 mg | ORAL_TABLET | ORAL | Status: DC | PRN

## 2018-06-19 MED ORDER — LIDOCAINE HCL (PF) 1 % IJ SOLN
INTRAMUSCULAR | Status: DC | PRN
  Administered 2018-06-19: 4 mL via EPIDURAL
  Administered 2018-06-19: 6 mL via EPIDURAL

## 2018-06-19 MED ORDER — FERROUS SULFATE 325 (65 FE) MG PO TABS
325.0000 mg | ORAL_TABLET | Freq: Two times a day (BID) | ORAL | Status: DC
  Administered 2018-06-19 – 2018-06-22 (×7): 325 mg via ORAL
  Filled 2018-06-19 (×7): qty 1

## 2018-06-19 MED ORDER — NALBUPHINE HCL 10 MG/ML IJ SOLN: 5.0000 mg | Freq: Once | INTRAMUSCULAR | Status: DC | PRN

## 2018-06-19 MED ORDER — MORPHINE SULFATE (PF) 0.5 MG/ML IJ SOLN
INTRAMUSCULAR | Status: AC
  Filled 2018-06-19: qty 10

## 2018-06-19 MED ORDER — PROMETHAZINE HCL 25 MG/ML IJ SOLN: 6.2500 mg | INTRAMUSCULAR | Status: DC | PRN

## 2018-06-19 MED ORDER — INSULIN ASPART 100 UNIT/ML ~~LOC~~ SOLN
0.0000 [IU] | Freq: Every day | SUBCUTANEOUS | Status: DC
  Administered 2018-06-19: 23:00:00 2 [IU] via SUBCUTANEOUS
  Administered 2018-06-21: 3 [IU] via SUBCUTANEOUS

## 2018-06-19 MED ORDER — DIPHENHYDRAMINE HCL 25 MG PO CAPS: 25.0000 mg | ORAL_CAPSULE | Freq: Four times a day (QID) | ORAL | Status: DC | PRN

## 2018-06-19 MED ORDER — OXYTOCIN 40 UNITS IN NORMAL SALINE INFUSION - SIMPLE MED
2.5000 [IU]/h | INTRAVENOUS | Status: AC
  Administered 2018-06-19: 2.5 [IU]/h via INTRAVENOUS

## 2018-06-19 MED ORDER — OXYCODONE HCL 5 MG PO TABS: 5.0000 mg | ORAL_TABLET | Freq: Once | ORAL | Status: DC | PRN

## 2018-06-19 MED ORDER — NALOXONE HCL 4 MG/10ML IJ SOLN
1.0000 ug/kg/h | INTRAVENOUS | Status: DC | PRN
  Filled 2018-06-19: qty 5

## 2018-06-19 MED ORDER — MAGNESIUM HYDROXIDE 400 MG/5ML PO SUSP: 30.0000 mL | ORAL | Status: DC | PRN

## 2018-06-19 MED ORDER — PHENYLEPHRINE HCL (PRESSORS) 10 MG/ML IV SOLN
INTRAVENOUS | Status: DC | PRN
  Administered 2018-06-19 (×2): 80 ug via INTRAVENOUS

## 2018-06-19 MED ORDER — ONDANSETRON HCL 4 MG/2ML IJ SOLN: 4.0000 mg | Freq: Three times a day (TID) | INTRAMUSCULAR | Status: DC | PRN

## 2018-06-19 MED ORDER — ONDANSETRON HCL 4 MG/2ML IJ SOLN
INTRAMUSCULAR | Status: DC | PRN
  Administered 2018-06-19: 4 mg via INTRAVENOUS

## 2018-06-19 MED ORDER — TETANUS-DIPHTH-ACELL PERTUSSIS 5-2.5-18.5 LF-MCG/0.5 IM SUSP: 0.5000 mL | Freq: Once | INTRAMUSCULAR | Status: DC

## 2018-06-19 MED ORDER — DIPHENHYDRAMINE HCL 25 MG PO CAPS: 25.0000 mg | ORAL_CAPSULE | ORAL | Status: DC | PRN

## 2018-06-19 MED ORDER — MEPERIDINE HCL 25 MG/ML IJ SOLN
INTRAMUSCULAR | Status: AC
  Filled 2018-06-19: qty 1

## 2018-06-19 MED ORDER — MEPERIDINE HCL 25 MG/ML IJ SOLN
INTRAMUSCULAR | Status: DC | PRN
  Administered 2018-06-19 (×2): 12.5 mg via INTRAVENOUS

## 2018-06-19 MED ORDER — SCOPOLAMINE 1 MG/3DAYS TD PT72: 1.0000 | MEDICATED_PATCH | Freq: Once | TRANSDERMAL | Status: DC

## 2018-06-19 MED ORDER — LIDOCAINE-EPINEPHRINE (PF) 2 %-1:200000 IJ SOLN
INTRAMUSCULAR | Status: DC | PRN
  Administered 2018-06-19: 3 mL via INTRADERMAL
  Administered 2018-06-19: 5 mL via INTRADERMAL

## 2018-06-19 MED ORDER — WITCH HAZEL-GLYCERIN EX PADS: 1.0000 "application " | MEDICATED_PAD | CUTANEOUS | Status: DC | PRN

## 2018-06-19 MED ORDER — NALOXONE HCL 0.4 MG/ML IJ SOLN: 0.4000 mg | INTRAMUSCULAR | Status: DC | PRN

## 2018-06-19 MED ORDER — INSULIN GLARGINE 100 UNIT/ML ~~LOC~~ SOLN
18.0000 [IU] | Freq: Every day | SUBCUTANEOUS | Status: DC
  Administered 2018-06-19 – 2018-06-20 (×2): 18 [IU] via SUBCUTANEOUS
  Filled 2018-06-19 (×3): qty 0.18

## 2018-06-19 MED ORDER — GABAPENTIN 100 MG PO CAPS
300.0000 mg | ORAL_CAPSULE | Freq: Two times a day (BID) | ORAL | Status: DC
  Administered 2018-06-19 – 2018-06-20 (×4): 300 mg via ORAL
  Filled 2018-06-19 (×5): qty 3

## 2018-06-19 MED ORDER — PHENYLEPHRINE 40 MCG/ML (10ML) SYRINGE FOR IV PUSH (FOR BLOOD PRESSURE SUPPORT)
PREFILLED_SYRINGE | INTRAVENOUS | Status: AC
  Administered 2018-06-19: 80 ug via INTRAVENOUS
  Filled 2018-06-19: qty 10

## 2018-06-19 MED ORDER — DIBUCAINE (PERIANAL) 1 % EX OINT: 1.0000 "application " | TOPICAL_OINTMENT | CUTANEOUS | Status: DC | PRN

## 2018-06-19 MED ORDER — NALBUPHINE HCL 10 MG/ML IJ SOLN: 5.0000 mg | INTRAMUSCULAR | Status: DC | PRN

## 2018-06-19 MED ORDER — MEPERIDINE HCL 25 MG/ML IJ SOLN: 6.2500 mg | INTRAMUSCULAR | Status: DC | PRN

## 2018-06-19 MED ORDER — COCONUT OIL OIL: 1.0000 "application " | TOPICAL_OIL | Status: DC | PRN

## 2018-06-19 MED ORDER — FENTANYL CITRATE (PF) 100 MCG/2ML IJ SOLN: 25.0000 ug | INTRAMUSCULAR | Status: DC | PRN

## 2018-06-19 MED ORDER — SENNOSIDES-DOCUSATE SODIUM 8.6-50 MG PO TABS
2.0000 | ORAL_TABLET | ORAL | Status: DC
  Administered 2018-06-20 – 2018-06-22 (×3): 2 via ORAL
  Filled 2018-06-19 (×3): qty 2

## 2018-06-19 MED ORDER — MENTHOL 3 MG MT LOZG: 1.0000 | LOZENGE | OROMUCOSAL | Status: DC | PRN

## 2018-06-19 MED ORDER — FENTANYL CITRATE (PF) 100 MCG/2ML IJ SOLN
INTRAMUSCULAR | Status: AC
  Filled 2018-06-19: qty 2

## 2018-06-19 MED ORDER — OXYCODONE HCL 5 MG/5ML PO SOLN: 5.0000 mg | Freq: Once | ORAL | Status: DC | PRN

## 2018-06-19 MED ORDER — SCOPOLAMINE 1 MG/3DAYS TD PT72
MEDICATED_PATCH | TRANSDERMAL | Status: DC | PRN
  Administered 2018-06-19: 1 via TRANSDERMAL

## 2018-06-19 MED ORDER — INSULIN ASPART 100 UNIT/ML ~~LOC~~ SOLN
0.0000 [IU] | Freq: Three times a day (TID) | SUBCUTANEOUS | Status: DC
  Administered 2018-06-19: 2 [IU] via SUBCUTANEOUS
  Administered 2018-06-19: 14:00:00 3 [IU] via SUBCUTANEOUS
  Administered 2018-06-20: 14:00:00 2 [IU] via SUBCUTANEOUS
  Administered 2018-06-20 – 2018-06-21 (×2): 1 [IU] via SUBCUTANEOUS
  Administered 2018-06-22: 10:00:00 2 [IU] via SUBCUTANEOUS

## 2018-06-19 MED ORDER — INSULIN GLARGINE 100 UNIT/ML ~~LOC~~ SOLN
18.0000 [IU] | Freq: Every day | SUBCUTANEOUS | Status: DC
  Administered 2018-06-19: 14:00:00 18 [IU] via SUBCUTANEOUS
  Filled 2018-06-19: qty 0.18

## 2018-06-19 MED ORDER — ACETAMINOPHEN 500 MG PO TABS
1000.0000 mg | ORAL_TABLET | Freq: Four times a day (QID) | ORAL | Status: DC
  Administered 2018-06-19 – 2018-06-22 (×11): 1000 mg via ORAL
  Filled 2018-06-19 (×14): qty 2

## 2018-06-19 MED ORDER — DEXTROSE 5 % IV SOLN
INTRAVENOUS | Status: DC | PRN
  Administered 2018-06-19: 3 g via INTRAVENOUS

## 2018-06-19 MED ORDER — SODIUM CHLORIDE 0.9% FLUSH: 3.0000 mL | INTRAVENOUS | Status: DC | PRN

## 2018-06-19 MED ORDER — HYDROMORPHONE HCL 1 MG/ML IJ SOLN: 1.0000 mg | INTRAMUSCULAR | Status: DC | PRN

## 2018-06-19 MED ORDER — SODIUM CHLORIDE 0.9 % IV SOLN
INTRAVENOUS | Status: DC | PRN
  Administered 2018-06-19: 40 [IU] via INTRAVENOUS

## 2018-06-19 MED ORDER — SODIUM CHLORIDE (PF) 0.9 % IJ SOLN
INTRAMUSCULAR | Status: DC | PRN
  Administered 2018-06-19: 14 mL/h via EPIDURAL

## 2018-06-19 MED ORDER — PHENYLEPHRINE 40 MCG/ML (10ML) SYRINGE FOR IV PUSH (FOR BLOOD PRESSURE SUPPORT)
PREFILLED_SYRINGE | INTRAVENOUS | Status: AC
  Filled 2018-06-19: qty 10

## 2018-06-19 MED ORDER — METFORMIN HCL 500 MG PO TABS
1000.0000 mg | ORAL_TABLET | Freq: Two times a day (BID) | ORAL | Status: DC
  Administered 2018-06-19 – 2018-06-22 (×7): 1000 mg via ORAL
  Filled 2018-06-19 (×7): qty 2

## 2018-06-19 MED ORDER — ENOXAPARIN SODIUM 60 MG/0.6ML ~~LOC~~ SOLN
0.5000 mg/kg | SUBCUTANEOUS | Status: DC
  Administered 2018-06-19: 16:00:00 60 mg via SUBCUTANEOUS
  Filled 2018-06-19 (×2): qty 0.6

## 2018-06-19 MED ORDER — ZOLPIDEM TARTRATE 5 MG PO TABS: 5.0000 mg | ORAL_TABLET | Freq: Every evening | ORAL | Status: DC | PRN

## 2018-06-19 MED ORDER — SIMETHICONE 80 MG PO CHEW
80.0000 mg | CHEWABLE_TABLET | ORAL | Status: DC | PRN
  Administered 2018-06-21: 12:00:00 80 mg via ORAL
  Filled 2018-06-19: qty 1

## 2018-06-19 MED ORDER — KETOROLAC TROMETHAMINE 30 MG/ML IJ SOLN
INTRAMUSCULAR | Status: AC
  Filled 2018-06-19: qty 1

## 2018-06-19 MED ORDER — SCOPOLAMINE 1 MG/3DAYS TD PT72
MEDICATED_PATCH | TRANSDERMAL | Status: AC
  Filled 2018-06-19: qty 1

## 2018-06-19 MED ORDER — FENTANYL-BUPIVACAINE-NACL 0.5-0.125-0.9 MG/250ML-% EP SOLN
EPIDURAL | Status: AC
  Filled 2018-06-19: qty 250

## 2018-06-19 MED ORDER — OXYCODONE HCL 5 MG PO TABS
5.0000 mg | ORAL_TABLET | ORAL | Status: DC | PRN
  Administered 2018-06-20 – 2018-06-21 (×6): 10 mg via ORAL
  Filled 2018-06-19 (×6): qty 2

## 2018-06-19 MED ORDER — PRENATAL MULTIVITAMIN CH
1.0000 | ORAL_TABLET | Freq: Every day | ORAL | Status: DC
  Administered 2018-06-19 – 2018-06-21 (×3): 1 via ORAL
  Filled 2018-06-19 (×3): qty 1

## 2018-06-19 MED ORDER — ONDANSETRON HCL 4 MG/2ML IJ SOLN
INTRAMUSCULAR | Status: AC
  Filled 2018-06-19: qty 2

## 2018-06-19 MED ORDER — MORPHINE SULFATE (PF) 0.5 MG/ML IJ SOLN
INTRAMUSCULAR | Status: DC | PRN
  Administered 2018-06-19: 3 mg via EPIDURAL

## 2018-06-19 MED ORDER — LACTATED RINGERS IV SOLN
INTRAVENOUS | Status: DC
  Administered 2018-06-19: 04:00:00 via INTRAVENOUS

## 2018-06-19 MED ORDER — MEASLES, MUMPS & RUBELLA VAC IJ SOLR: 0.5000 mL | Freq: Once | INTRAMUSCULAR | Status: DC

## 2018-06-19 MED ORDER — ALBUTEROL SULFATE (2.5 MG/3ML) 0.083% IN NEBU: 3.0000 mL | INHALATION_SOLUTION | Freq: Four times a day (QID) | RESPIRATORY_TRACT | Status: DC | PRN

## 2018-06-19 MED ORDER — SIMETHICONE 80 MG PO CHEW
80.0000 mg | CHEWABLE_TABLET | ORAL | Status: DC
  Administered 2018-06-20 – 2018-06-22 (×3): 80 mg via ORAL
  Filled 2018-06-19 (×3): qty 1

## 2018-06-19 MED ORDER — FENTANYL CITRATE (PF) 100 MCG/2ML IJ SOLN
INTRAMUSCULAR | Status: DC | PRN
  Administered 2018-06-19: 100 ug via EPIDURAL

## 2018-06-19 MED ORDER — DEXAMETHASONE SODIUM PHOSPHATE 4 MG/ML IJ SOLN
INTRAMUSCULAR | Status: AC
  Filled 2018-06-19: qty 1

## 2018-06-19 MED ORDER — DIPHENHYDRAMINE HCL 50 MG/ML IJ SOLN
12.5000 mg | INTRAMUSCULAR | Status: DC | PRN
  Administered 2018-06-19: 07:00:00 12.5 mg via INTRAVENOUS
  Filled 2018-06-19: qty 1

## 2018-06-19 MED ORDER — LIDOCAINE-EPINEPHRINE (PF) 2 %-1:200000 IJ SOLN
INTRAMUSCULAR | Status: AC
  Filled 2018-06-19: qty 10

## 2018-06-19 SURGICAL SUPPLY — 31 items
CHLORAPREP W/TINT 26ML (MISCELLANEOUS) ×3 IMPLANT
CLAMP CORD UMBIL (MISCELLANEOUS) IMPLANT
CLOTH BEACON ORANGE TIMEOUT ST (SAFETY) ×3 IMPLANT
DRSG OPSITE POSTOP 4X10 (GAUZE/BANDAGES/DRESSINGS) ×3 IMPLANT
ELECT REM PT RETURN 9FT ADLT (ELECTROSURGICAL) ×3
ELECTRODE REM PT RTRN 9FT ADLT (ELECTROSURGICAL) ×1 IMPLANT
EXTRACTOR VACUUM M CUP 4 TUBE (SUCTIONS) IMPLANT
EXTRACTOR VACUUM M CUP 4' TUBE (SUCTIONS)
GLOVE BIOGEL PI IND STRL 7.0 (GLOVE) ×3 IMPLANT
GLOVE BIOGEL PI INDICATOR 7.0 (GLOVE) ×6
GLOVE ECLIPSE 7.0 STRL STRAW (GLOVE) ×3 IMPLANT
GOWN STRL REUS W/TWL LRG LVL3 (GOWN DISPOSABLE) ×6 IMPLANT
KIT ABG SYR 3ML LUER SLIP (SYRINGE) IMPLANT
NDL HYPO 25X5/8 SAFETYGLIDE (NEEDLE) ×1 IMPLANT
NEEDLE HYPO 22GX1.5 SAFETY (NEEDLE) ×3 IMPLANT
NEEDLE HYPO 25X5/8 SAFETYGLIDE (NEEDLE) ×3 IMPLANT
NS IRRIG 1000ML POUR BTL (IV SOLUTION) ×3 IMPLANT
PACK C SECTION WH (CUSTOM PROCEDURE TRAY) ×3 IMPLANT
PAD ABD 7.5X8 STRL (GAUZE/BANDAGES/DRESSINGS) ×3 IMPLANT
PAD OB MATERNITY 4.3X12.25 (PERSONAL CARE ITEMS) ×3 IMPLANT
PENCIL SMOKE EVAC W/HOLSTER (ELECTROSURGICAL) ×3 IMPLANT
RTRCTR C-SECT PINK 25CM LRG (MISCELLANEOUS) IMPLANT
SUT PDS AB 0 CTX 36 PDP370T (SUTURE) IMPLANT
SUT PLAIN 2 0 XLH (SUTURE) IMPLANT
SUT VIC AB 0 CTX 36 (SUTURE) ×6
SUT VIC AB 0 CTX36XBRD ANBCTRL (SUTURE) ×2 IMPLANT
SUT VIC AB 4-0 KS 27 (SUTURE) ×3 IMPLANT
SYR CONTROL 10ML LL (SYRINGE) ×3 IMPLANT
TOWEL OR 17X24 6PK STRL BLUE (TOWEL DISPOSABLE) ×3 IMPLANT
TRAY FOLEY W/BAG SLVR 14FR LF (SET/KITS/TRAYS/PACK) ×3 IMPLANT
WATER STERILE IRR 1000ML POUR (IV SOLUTION) ×3 IMPLANT

## 2018-06-19 NOTE — Addendum Note (Signed)
Addendum  created 06/19/18 0805 by Hewitt Blade, CRNA   Clinical Note Signed

## 2018-06-19 NOTE — Anesthesia Procedure Notes (Signed)
Epidural Patient location during procedure: OB Start time: 06/18/2018 11:57 PM End time: 06/19/2018 12:01 AM  Staffing Anesthesiologist: Audry Pili, MD Performed: anesthesiologist   Preanesthetic Checklist Completed: patient identified, pre-op evaluation, timeout performed, IV checked, risks and benefits discussed and monitors and equipment checked  Epidural Patient position: sitting Prep: DuraPrep Patient monitoring: continuous pulse ox and blood pressure Approach: midline Location: L2-L3 Injection technique: LOR saline  Needle:  Needle type: Tuohy  Needle gauge: 17 G Needle length: 15 cm Needle insertion depth: 10 cm Catheter size: 19 Gauge Catheter at skin depth: 15 cm Test dose: negative and Other (1% lidocaine)  Assessment Events: blood not aspirated  Additional Notes Patient identified. Risks including, but not limited to, bleeding, infection, nerve damage, paralysis, inadequate analgesia, blood pressure changes, nausea, vomiting, allergic reaction, postpartum back pain, itching, and headache were discussed. Patient expressed understanding and wished to proceed. Sterile prep and drape, including hand hygiene, mask, and sterile gloves were used. The patient was positioned and the spine was prepped. The skin was anesthetized with lidocaine. No paraesthesia or other complication noted. The patient did not experience any signs of intravascular injection such as tinnitus or metallic taste in mouth, nor signs of intrathecal spread such as rapid motor block. Please see nursing notes for vital signs. The patient tolerated the procedure well.   Renold Don, MDReason for block:procedure for pain

## 2018-06-19 NOTE — Progress Notes (Signed)
CNM called to bedside for FHR deceleration. Upon arrival, FHR in 70s-80s for approximately 10 minutes. Cervix unchanged. LR bolus, O2, position changed and pitocin stopped. Dr. Harolyn Rutherford notified at 0027 and terbutaline given. Dr. Harolyn Rutherford at bedside and applied fetal scalp electrode applied and confirmed FHT of 70s. STAT c-section called.  Wende Mott CNM 06/19/18 12:40 AM

## 2018-06-19 NOTE — Transfer of Care (Signed)
Immediate Anesthesia Transfer of Care Note  Patient: Grace Montgomery  Procedure(s) Performed: CESAREAN SECTION (N/A )  Patient Location: PACU  Anesthesia Type:Epidural  Level of Consciousness: awake, alert  and oriented  Airway & Oxygen Therapy: Patient Spontanous Breathing  Post-op Assessment: Report given to RN and Post -op Vital signs reviewed and stable  Post vital signs: Reviewed and stable  Last Vitals:  Vitals Value Taken Time  BP 91/44 06/19/2018  1:40 AM  Temp    Pulse 112 06/19/2018  1:44 AM  Resp 20 06/19/2018  1:44 AM  SpO2 98 % 06/19/2018  1:44 AM  Vitals shown include unvalidated device data.  Last Pain:  Vitals:   06/18/18 2200  TempSrc:   PainSc: 7       Patients Stated Pain Goal: 2 (17/61/60 7371)  Complications: No apparent anesthesia complications

## 2018-06-19 NOTE — Progress Notes (Signed)
Provider notified of CBG 245, patient refusing CO2 monitoring and fluctuation of o2. Patient's O2 has been fluctuating between 85 and 99. Patient stated " I don't need no damn oxygen I am breathing just fine.". Provider stated she is going to contact the attending and will follow up. No new orders.

## 2018-06-19 NOTE — Progress Notes (Signed)
Patient called out stating that she was hot, went in to give her a cold rag and some cold water but she refused both stating she has enough cold shit. Patient asked me to take off SCD's I explained to her I can not and why, she told me in response to take the shit off or she will. I ounce again told patient I can not take those off because they are ordered and she told me fuck that shit she will do it herself. I walked out the room to get the nurse.

## 2018-06-19 NOTE — Op Note (Signed)
Grace Montgomery PROCEDURE DATE: 06/19/2018  PREOPERATIVE DIAGNOSES: Intrauterine pregnancy at [redacted]w[redacted]d weeks gestation; non-reassuring fetal status with bradycardia to the 70s for over 10 minutes; poorly controlled T2DM; morbid obesity; polyhydramnios  POSTOPERATIVE DIAGNOSES: The same  PROCEDURE: STAT Primary Low Transverse Cesarean Section  SURGEON:  Dr. Verita Schneiders  ANESTHESIOLOGY TEAM: Anesthesiologist: Audry Pili, MD CRNA: Sandrea Matte, CRNA; Sterling, Sheron Nightingale, CRNA  INDICATIONS: Grace Montgomery is a 28 y.o. [phone] at [redacted]w[redacted]d here for STAT cesarean section secondary to the indications listed under preoperative diagnoses; please see preoperative notes for further details.  The risks of cesarean section were discussed with the patient quickly and informed consent for the procedure was given.    FINDINGS:  Viable female infant in direct occiput posterior cephalic presentation.  Fetal head molding present.  Tight nuchal cord noted. Apgars 8 and 9.  Clear amniotic fluid.  Intact placenta, three vessel cord.  Normal uterus, fallopian tubes and ovaries bilaterally.  ANESTHESIA: Epidural  INTRAVENOUS FLUIDS: 1500 ml   ESTIMATED BLOOD LOSS: 173 ml as per [facility] URINE OUTPUT:  150 ml SPECIMENS: Placenta sent to pathology COMPLICATIONS: None immediate  PROCEDURE IN DETAIL:  The patient was urgently taken to the the operating room her epidural anesthesia was dosed up to surgical level and was found to be adequate. She was then placed in a dorsal supine position with a leftward tilt, prepped quickly with betadine and draped in a sterile manner.  She already had a foley catheter in her bladder from L&D.  After a timeout was performed, a Pfannenstiel skin incision was made with scalpel and carried through to the underlying layer of fascia. The fascial incision was extended bilaterally in a blunt fashion.  The fascia was separated from underlying rectus muscles bluntly.  The rectus  muscles were separated in the midline bluntly and the peritoneum was entered bluntly. Attention was turned to the lower uterine segment where a low transverse hysterotomy was made with a scalpel and extended bilaterally bluntly.  The infant was successfully delivered, the cord was clamped and cut and the infant was handed over to awaiting neonatology team. Incision to delivery time was about one minute.  The placenta was delivered intact and had a three-vessel cord. The uterus was then cleared of clot and debris.  The hysterotomy was closed with 0 Vicryl in a running locked fashion, and an imbricating layer was also placed with 0 Vicryl.  The pelvis was cleared of all clot and debris. Hemostasis was confirmed on all surfaces.  The peritoneum was closed with a 0 Vicryl running stitch. The fascia was then closed using 0 PDS in a running fashion.  The subcutaneous layer was irrigated, reapproximated with 2-0 plain gut interrupted stitches , and the skin was closed with a 4-0 Vicryl subcuticular stitch.  After the skin was closed, a Prevena disposable negative pressure wound therapy device was placed over the incision.  The suction was activated at a pressure of 22mmHg.  The adhesive was affixed well and there were no leaks noted.  The patient tolerated the procedure well. Sponge, instrument and needle counts were correct x 3.  She was taken to the recovery room in stable condition.    Verita Schneiders, MD, [place] for [employer], [place]

## 2018-06-19 NOTE — Discharge Summary (Signed)
Postpartum Discharge Summary    Patient Name: Grace Montgomery DOB: 03/02/1991 MRN: [id number]  Date of admission: 06/18/2018 Delivering Provider: ANYANWU, UGONNA A   Date of discharge: 06/22/2018  Admitting diagnosis: No admission diagnoses are documented for this encounter. Intrauterine pregnancy: [redacted]w[redacted]d     Secondary diagnosis:  Principal Problem:   S/P emergency cesarean section Active Problems:   Type 2 diabetes mellitus complicating pregnancy in third trimester, antepartum   Poorly controlled type 2 diabetes mellitus (HCC)     Discharge diagnosis: Term Pregnancy Delivered and Type 2 DM      Postpartum procedures:none Augmentation: Pitocin and Cytotec Complications: Cesarean section for bradycardia/NRFHT  Hospital course:  Induction of Labor With Cesarean Section  28 y.o. yo [id number] at [redacted]w[redacted]d was admitted to the hospital 06/18/2018 for induction of labor. Patient had a labor course significant for polyhydramnios, uncontrolled diabetes on glucostablizer. The patient went for cesarean section due to Non-Reassuring FHR, and delivered a Viable infant,06/19/2018  Membrane Rupture Time/Date: 8:16 PM ,06/18/2018   Details of operation can be found in separate operative Note.  Patient had an uncomplicated postpartum course. She is ambulating, tolerating a regular diet, passing flatus, and urinating well. Her blood glucose was well-controlled postpartum, and she was discharged on metformin 1000mg BID.  Patient is discharged home in stable condition on 06/22/18.                                   Magnesium Sulfate recieved: No BMZ received: No  Physical exam  Vitals:   06/21/18 1454 06/21/18 1818 06/21/18 2300 06/22/18 0621  BP: (!) 132/91  108/75 101/78  Pulse: (!) 101  (!) 102 89  Resp: 19  20 18  Temp: 98.4 F (36.9 C) 98.9 F (37.2 C)  97.9 F (36.6 C)  TempSrc: Oral Oral  Oral  SpO2: 100%     Weight:      Height:       General: alert, well-appearing, NAD Lochia:  appropriate Uterine Fundus: firm Incision: prevena in place DVT Evaluation: No significant calf/ankle edema  Labs: Lab Results  Component Value Date   WBC 12.1 (H) 06/20/2018   HGB 11.2 (L) 06/20/2018   HCT 33.4 (L) 06/20/2018   MCV 92.5 06/20/2018   PLT 255 06/20/2018   CMP Latest Ref Rng & Units 06/20/2018  Glucose 70 - 99 mg/dL 170(H)  BUN 6 - 20 mg/dL 6  Creatinine 0.44 - 1.00 mg/dL 0.77  Sodium 135 - 145 mmol/L 136  Potassium 3.5 - 5.1 mmol/L 3.9  Chloride 98 - 111 mmol/L 105  CO2 22 - 32 mmol/L 23  Calcium 8.9 - 10.3 mg/dL 9.0  Total Protein 6.5 - 8.1 g/dL 5.7(L)  Total Bilirubin 0.3 - 1.2 mg/dL 0.5  Alkaline Phos 38 - 126 U/L 82  AST 15 - 41 U/L 18  ALT 0 - 44 U/L 17    Discharge instruction: per After Visit Summary and "Baby and Me Booklet".  After visit meds:  Allergies as of 06/22/2018      Reactions   Monistat [tioconazole] Swelling   Chocolate Itching, Other (See Comments)   Ears & throat itch   Ibuprofen Swelling   Swelling of the feet   Banana Itching, Other (See Comments)   Throat itching      Medication List    STOP taking these medications   Accu-Chek FastClix Lancets Misc   Accu-Chek Guide w/Device Kit     aspirin EC 81 MG tablet   glucose blood test strip Commonly known as:  Accu-Chek SmartView   insulin aspart 100 UNIT/ML injection Commonly known as:  novoLOG   insulin NPH Human 100 UNIT/ML injection Commonly known as:  HUMULIN N,NOVOLIN N     TAKE these medications   acetaminophen 500 MG tablet Commonly known as:  TYLENOL Take 1 tablet (500 mg total) by mouth every 6 (six) hours as needed.   albuterol 108 (90 Base) MCG/ACT inhaler Commonly known as:  PROVENTIL HFA;VENTOLIN HFA Inhale 1-2 puffs into the lungs every 6 (six) hours as needed for wheezing or shortness of breath.   metFORMIN 1000 MG tablet Commonly known as:  GLUCOPHAGE Take 1 tablet (1,000 mg total) by mouth 2 (two) times daily with a meal.   oxyCODONE 5 MG  immediate release tablet Commonly known as:  Oxy IR/ROXICODONE Take 1 tablet (5 mg total) by mouth every 6 (six) hours as needed for up to 5 days for severe pain.   pantoprazole 20 MG tablet Commonly known as:  Protonix Take 1 tablet (20 mg total) by mouth 2 (two) times daily.   Prenate Pixie 10-0.6-0.4-200 MG Caps Take 1 capsule by mouth daily.   senna-docusate 8.6-50 MG tablet Commonly known as:  Senokot-S Take 2 tablets by mouth at bedtime as needed for mild constipation.            Discharge Care Instructions  (From admission, onward)         Start     Ordered   06/22/18 0000  Discharge wound care:    Comments:  Leave wound vac in place for a total of one week and then disconnect as instructed.   06/22/18 0952         Diet: carb modified diet Activity: Advance as tolerated. Pelvic rest for 6 weeks.   Outpatient follow up:4 weeks Follow up Appt: Future Appointments  Date Time Provider Department Center  06/28/2018  1:00 PM Arnold, James G, MD [facility] None  06/30/2018  2:30 PM Fulp, Cammie, MD [facility] None  07/17/2018  1:15 PM Constant, Peggy, MD [facility] None   Follow up Visit: Follow-up Information    Fulp, Cammie, MD .   Specialty:  Family Medicine Contact information: [address] [place] [address] [phone]        [facility]. Schedule an appointment as soon as possible for a visit.   Specialty:  Obstetrics and Gynecology Why:  You should receive a call to schedule a postpartum follow-up visit. If you do not, please call clinic to make an appointment in 4-6 weeks.  Contact information: [address], [address] [place] [address] [phone]        Please schedule this patient for Postpartum visit in: 4 weeks with the following provider: Any provider For C/S patients schedule nurse incision check in weeks 2 weeks: yes High risk pregnancy complicated by: GDM Delivery mode:   CS Anticipated Birth Control:  Condoms PP Procedures needed: 2 hour GTT, incision check Schedule Integrated BH visit: no  Newborn Data: Live born female  Birth Weight:  3690g APGAR: 8, 9  Newborn Delivery   Birth date/time:  06/19/2018 00:43:00 Delivery type:  C-Section, Low Vertical Trial of labor:  Yes C-section categorization:  Primary    Baby Feeding: Bottle Disposition:home with mother  Laurel S. Wallace, DO OB/GYN Fellow  
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
## 2018-06-19 NOTE — Lactation Note (Signed)
This note was copied from a baby's chart. Lactation Consultation Note Baby 4 hrs old. Attempted to see mom. Mom was upset saying she was to hot and couldn't stand it. Mom pulled the CO2 sensor off of her nose saying she wasn't wearing it. Mom is type 2 DM insulin dependent. Mom wouldn't hold baby in [place] after delivery d/t not feeling well.  Mom is breast/formula feeding. FOB gave baby bottle/formula. LC explained to mom Lactation would visit later when feeling better.  Patient Name: Grace Montgomery [id number] Date: 06/19/2018 Reason for consult: Initial assessment   Maternal Data Has patient been taught Hand Expression?: Yes  Feeding Feeding Type: Bottle Fed - Formula Nipple Type: Slow - flow  LATCH Score                   Interventions    Lactation Tools Discussed/Used WIC Program: Yes   Consult Status Consult Status: Follow-up Date: 06/19/18 Follow-up type: In-patient    Theodoro Kalata 06/19/2018, 4:43 AM

## 2018-06-19 NOTE — Progress Notes (Signed)
Inpatient Diabetes Program Recommendations  AACE/ADA: New Consensus Statement on Inpatient Glycemic Control (2015)  Target Ranges:  Prepandial:   less than 140 mg/dL      Peak postprandial:   less than 180 mg/dL (1-2 hours)      Critically ill patients:  140 - 180 mg/dL   Lab Results  Component Value Date   GLUCAP 245 (H) 06/19/2018   HGBA1C 5.8 (H) 01/12/2018    Review of Glycemic Control Results for Grace Montgomery, Grace Montgomery (MRN [id number]) as of 06/19/2018 10:04  Ref. Range 06/19/2018 00:11 06/19/2018 01:53 06/19/2018 06:06  Glucose-Capillary Latest Ref Range: 70 - 99 mg/dL 79 129 (H) 245 (H)   Diabetes history: type 2 DM Outpatient Diabetes medications: NPH 30 units BID, Novolog 24 units TID Current orders for Inpatient glycemic control: Metformin 1000 mg BID  Inpatient Diabetes Program Recommendations:    Consider: - Lantus 18 units QD. - Novolog 0-9 units TID & HS under Glycemic control order set. - Will place CM consult to help arrange outpatient follow up with PCP, as patient has pregnancy Medicaid that will expire. They may help to get patient in with [facility] which has a pharmacy to help provide insulins at low cost.   Patient has a meter and supplies, spoke with patient on 3/18.   Thanks, Bronson Curb, MSN, RNC-OB Diabetes Coordinator [phone] (8a-5p)

## 2018-06-19 NOTE — Progress Notes (Signed)
Patient removed CO2 monitor and SCD. RN educated patient, patient refuses stated "she doesn't need this damn thing".

## 2018-06-19 NOTE — Anesthesia Postprocedure Evaluation (Signed)
Anesthesia Post Note  Patient: Grace Montgomery  Procedure(s) Performed: CESAREAN SECTION (N/A )     Patient location during evaluation: Mother Baby Anesthesia Type: Epidural Level of consciousness: awake and alert Pain management: pain level controlled Vital Signs Assessment: post-procedure vital signs reviewed and stable Respiratory status: spontaneous breathing, nonlabored ventilation and respiratory function stable Cardiovascular status: stable Postop Assessment: no headache, no backache, epidural receding, no apparent nausea or vomiting, patient able to bend at knees, able to ambulate and adequate PO intake Anesthetic complications: no    Last Vitals:  Vitals:   06/19/18 0445 06/19/18 0606  BP: 96/68 117/69  Pulse: 86 88  Resp: 14 16  Temp: 36.9 C 36.9 C  SpO2: 96%     Last Pain:  Vitals:   06/19/18 0606  TempSrc: Oral  PainSc:    Pain Goal: Patients Stated Pain Goal: 2 (06/18/18 2200)                 [name]

## 2018-06-19 NOTE — Anesthesia Postprocedure Evaluation (Signed)
Anesthesia Post Note  Patient: Grace Montgomery  Procedure(s) Performed: CESAREAN SECTION (N/A )     Patient location during evaluation: PACU Anesthesia Type: Epidural Level of consciousness: awake and alert Pain management: pain level controlled Vital Signs Assessment: post-procedure vital signs reviewed and stable Respiratory status: spontaneous breathing, respiratory function stable and nonlabored ventilation Cardiovascular status: blood pressure returned to baseline Postop Assessment: epidural receding and no apparent nausea or vomiting Anesthetic complications: no    Last Vitals:  Vitals:   06/19/18 0330 06/19/18 0414  BP: (!) 112/54   Pulse: 98   Resp: 20   Temp: 36.8 C   SpO2:  96%    Last Pain:  Vitals:   06/19/18 0330  TempSrc: Oral  PainSc: 0-No pain   Pain Goal: Patients Stated Pain Goal: 2 (06/18/18 2200)                 Audry Pili

## 2018-06-19 NOTE — Progress Notes (Signed)
Stat c/s called for bradycardia unresponsive to multiple intrauterine neonatal resuscitation maneuvers.Patient verbally consented. To OR now.  Verita Schneiders, MD

## 2018-06-19 NOTE — Progress Notes (Signed)
Nutrition: consult for diet education  Pt has had 2 previous episodes of diet education in the past 6 months, the last being while in house on 05/17/18.  Weyman Rodney M.Fredderick Severance LDN Neonatal Nutrition Support Specialist/RD III Pager [phone]      Phone [phone]

## 2018-06-20 LAB — COMPREHENSIVE METABOLIC PANEL
ALT: 17 U/L (ref 0–44)
AST: 18 U/L (ref 15–41)
Albumin: 2 g/dL — ABNORMAL LOW (ref 3.5–5.0)
Alkaline Phosphatase: 82 U/L (ref 38–126)
Anion gap: 8 (ref 5–15)
BUN: 6 mg/dL (ref 6–20)
CO2: 23 mmol/L (ref 22–32)
Calcium: 9 mg/dL (ref 8.9–10.3)
Chloride: 105 mmol/L (ref 98–111)
Creatinine, Ser: 0.77 mg/dL (ref 0.44–1.00)
GFR calc Af Amer: >60 mL/min (ref >60)
GFR calc non Af Amer: >60 mL/min (ref >60)
Glucose, Bld: 170 mg/dL — ABNORMAL HIGH (ref 70–99)
Potassium: 3.9 mmol/L (ref 3.5–5.1)
Sodium: 136 mmol/L (ref 135–145)
Total Bilirubin: 0.5 mg/dL (ref 0.3–1.2)
Total Protein: 5.7 g/dL — ABNORMAL LOW (ref 6.5–8.1)

## 2018-06-20 LAB — GLUCOSE, CAPILLARY
Glucose-Capillary: 190 mg/dL — ABNORMAL HIGH (ref 70–99)
Glucose-Capillary: 153 mg/dL — ABNORMAL HIGH (ref 70–99)
Glucose-Capillary: 157 mg/dL — ABNORMAL HIGH (ref 70–99)
Glucose-Capillary: 150 mg/dL — ABNORMAL HIGH (ref 70–99)
Glucose-Capillary: 162 mg/dL — ABNORMAL HIGH (ref 70–99)
Glucose-Capillary: 102 mg/dL — ABNORMAL HIGH (ref 70–99)
Glucose-Capillary: 129 mg/dL — ABNORMAL HIGH (ref 70–99)

## 2018-06-20 LAB — CBC
HCT: 33.4 % — ABNORMAL LOW (ref 36.0–46.0)
Hemoglobin: 11.2 g/dL — ABNORMAL LOW (ref 12.0–15.0)
MCH: 31 pg (ref 26.0–34.0)
MCHC: 33.5 g/dL (ref 30.0–36.0)
MCV: 92.5 fL (ref 80.0–100.0)
Platelets: 255 10*3/uL (ref 150–400)
RBC: 3.61 MIL/uL — ABNORMAL LOW (ref 3.87–5.11)
RDW: 14 % (ref 11.5–15.5)
WBC: 12.1 10*3/uL — ABNORMAL HIGH (ref 4.0–10.5)
nRBC: 0 % (ref 0.0–0.2)

## 2018-06-20 MED ORDER — INSULIN ASPART 100 UNIT/ML ~~LOC~~ SOLN
6.0000 [IU] | Freq: Three times a day (TID) | SUBCUTANEOUS | Status: DC
  Administered 2018-06-20 – 2018-06-22 (×5): 6 [IU] via SUBCUTANEOUS

## 2018-06-20 MED ORDER — INSULIN GLARGINE 100 UNIT/ML ~~LOC~~ SOLN
25.0000 [IU] | Freq: Every day | SUBCUTANEOUS | Status: DC
  Administered 2018-06-21 – 2018-06-22 (×2): 25 [IU] via SUBCUTANEOUS
  Filled 2018-06-20 (×3): qty 0.25

## 2018-06-20 MED ORDER — GABAPENTIN 100 MG PO CAPS
400.0000 mg | ORAL_CAPSULE | Freq: Three times a day (TID) | ORAL | Status: DC
  Administered 2018-06-20 – 2018-06-22 (×6): 400 mg via ORAL
  Filled 2018-06-20 (×6): qty 4

## 2018-06-20 NOTE — Care Management Note (Signed)
Case Management Note  Patient Details  Name: Grace Montgomery MRN: [id number] Date of Birth: 10/26/1990  Subjective/Objective:     28 yo Female admitted 06/18/18 S/P STAT Primary LTCS.  Pt is a known poorly controlled T2DM.              Action/Plan:D/C when medically stable.            Expected Discharge Plan:  Home/Self Care  In-House Referral:  Clinical Social Work, PCP / Chief Executive Officer  CM Consult, Follow-up appt scheduled  Status of Service:  Completed, signed off  Additional Comments:CM received consult.  CM met with pt and FOB in pt's hospital room.  Pt stated that she was previously being seen by the NP at [facility] and that is where she was receiving her prescriptions for medications.  Pt states she is agreeable to going to [facility] for follow up care regarding her T2DM upon discharge. Pt has an appt. 06/30/18 at 230 pm to see Dr. Chapman Fitch.  Pt given information for appt.  Aida Raider RNC-MNN, BSN 06/20/2018, 10:49 AM

## 2018-06-20 NOTE — Progress Notes (Deleted)
Pts band would not scan on Glucose Machine. CBG result 153. Faxed POCT form at 0508 06/20/2018. Nurse notified.

## 2018-06-20 NOTE — Progress Notes (Signed)
Pts band would not scan on glucose meter. CBG 157. Faxed POCT form at 0700 06/20/2018. Nurse notified.

## 2018-06-20 NOTE — Progress Notes (Addendum)
Subjective: Postpartum Day 1: Cesarean Delivery Patient reports incisional pain, tolerating PO, + flatus and no problems voiding. Poor appetite.  Objective: Vital signs in last 24 hours: Temp:  [98 F (36.7 C)-98.7 F (37.1 C)] 98.7 F (37.1 C) (04/14 0606) Pulse Rate:  [85-103] 103 (04/14 0606) Resp:  [19-20] 20 (04/14 0606) BP: (110-121)/(59-72) 112/67 (04/14 0606) SpO2:  [93 %-100 %] 100 % (04/14 0606)  Physical Exam:  General: alert, cooperative, appears stated age, mild distress and moderately obese Lochia: appropriate Uterine Fundus: firm Incision: no significant drainage, Wound vac in place. Do drainage around dressing.  DVT Evaluation: No evidence of DVT seen on physical exam.  Recent Labs    06/18/18 0800 06/20/18 0629  HGB 12.1 11.2*  HCT 35.9* 33.4*   Results for CHRISTYNE, MCCAIN (MRN [id number]) as of 06/20/2018 14:14  Ref. Range 06/19/2018 06:06 06/19/2018 12:30 06/19/2018 17:56 06/19/2018 22:08 06/20/2018 04:53 06/20/2018 06:20 06/20/2018 09:39  Glucose-Capillary Latest Ref Range: 70 - 99 mg/dL 245 (H) 248 (H) 190 (H) 249 (H) 153 (H) 157 (H) 150 (H)    Assessment/Plan: Status post Cesarean section. Uncontrolled incision pain. No evidence of operative complication.   - Increase Neurontin to 400 TID. - Discussed realistic pain goals vs signs of complications.  Type 2 DM (Uncontrolled) - Continue Lantus, Metformin. Added scheduled Novolog 6 U TID and increased SSI.  Poor appetite - Antiemetic PRN - Check CBG before lunch Novolog.  Bipolar Disorder - SW consult done - Declines to restart Prozac that she had been on in the past.  - Has F/U MH appt 4/24.  Manya Silvas 06/20/2018, 1:01 PM

## 2018-06-20 NOTE — Clinical Social Work Maternal (Signed)
CLINICAL SOCIAL WORK MATERNAL/CHILD NOTE  Patient Details  Name: Grace Montgomery MRN: [id number] Date of Birth: 1990/03/15  Date:  06/20/2018  Clinical Social Worker Initiating Note:  Grace Montgomery Date/Time: Initiated:  06/20/18/      Child's Name:  Grace Montgomery.   Biological Parents:  Mother, Father(Grace Montgomery and Grace Montgomery: 10/30/1993)   Need for Interpreter:  None   Reason for Referral:  Behavioral Health Concerns   Address:  [place] [address]    Phone number:  [phone] (home)     Additional phone number:   Household Members/Support Persons (HM/SP):       HM/SP Name Relationship DOB or Age  HM/SP -1  Grace Montgomery (residing with Paternal Grandfather- Grace Montgomery)  Son  11/15/2006  HM/SP -2  Grace Montgomery (residing with Maternal Grandfather- Grace Montgomery)  Daughter  06/04/2008  HM/SP -3  Grace Montgomery (adopted by 1st cousin- Grace Montgomery)  Son  05/24/2009  HM/SP -4        HM/SP -5        HM/SP -6        HM/SP -7        HM/SP -8          Natural Supports (not living in the home):  Parent, Spouse/significant other, Immediate Family   Professional Supports: Other (Comment)(MOB currently residing at [facility])   Employment: Unemployed   Type of Work:     Education:  Grace Montgomery school graduate   Homebound arranged:    Museum/gallery curator Resources:  [employer]   Other Resources:  Physicist, medical , [place] Considerations Which May Impact Care:    Strengths:  Ability to meet basic needs , Home prepared for child , Pediatrician chosen   Psychotropic Medications:         Pediatrician:    [facility] area  Pediatrician List:   [facility] [place]      Pediatrician Fax Number:    Risk Factors/Current Problems:  Mental Health Concerns    Cognitive State:  Distractible     Mood/Affect:  Calm , Flat    CSW Assessment: CSW received consult for history of anxiety, depression and bipolar disorder.  CSW met with MOB to offer support and complete assessment.    MOB sitting up in chair alert and holding infant when CSW entered the Grace. CSW introduced self and role and explained reason for consult. MOB nodded in understanding. MOB presented with flat affect and was distracted throughout assessment. MOB reported she currently resides at [facility] and has been there since October 2019. MOB stated having a positive experience at [facility] and shared staff has been "wonderful". MOB reported she has been approved for an apartment but cannot move in until the "coronavirus stuff is over". MOB stated her highest level of education completed was high school and stated she is not currently employed. MOB confirmed she receives both [facility] and [employer] and is aware of how to get infant added on to her plan. MOB shared that she has 3 other children, 2 of which she still has custody of (Grace and [place]) but are currently residing with maternal grandfather Grace Montgomery) until MOB gets moved into her new apartment. MOB reported her youngest child Grace Montgomery) was adopted by  her cousin Grace Montgomery and reported they live in [place] but was not able to provide a phone number. MOB denied any CPS involvement and stated adoption was mutually agreed upon.  CSW inquired about MOB's mental health history and MOB acknowledged a history of bipolar depression. MOB stated she was diagnosed about 12 years ago and was taking Prozac and Trazadone prior to her pregnancy. MOB reported she discontinued medications when she found out she was pregnant. FOB entered the Grace at this time and CSW introduced self and received verbal permission to continue assessment with FOB present. MOB reported her mental health was "horrible" during her pregnancy without medications. FOB reported MOB  experienced a lot of mood swings and cried a lot during her pregnancy. MOB shared she is currently receiving counseling and medication management at [facility] and plans to discuss with them about getting restarted on her medications as she does not like Prozac. MOB stated her follow up appointment is on 4/24. MOB denied having any PPD with her other children. CSW attempted to provide education regarding the baby blues period vs. perinatal mood disorders. MOB distracted during education and began having conversation with FOB. MOB reported her only concern at the moment was her "pain". CSW recommended self-evaluation during the postpartum time period using the New Mom Checklist from Postpartum Progress and encouraged MOB to contact a medical professional if symptoms are noted at any time.  MOB denied any current SI or HI and reported having a good support system consisting of FOB, FOB's mother and MOB's mother. MOB also receiving a plethora of supports via [facility] for housing, mental health, parenting, and any other resources requested by MOB.  MOB also has a case Freight forwarder that MOB meets with weekly at [facility].    MOB confirmed having all essential items for infant once discharged. CSW attempted to provide review of Sudden Infant Death Syndrome (SIDS) precautions and safe sleeping habits. CSW noted stuffed animals in basinet and encouraged MOB to have them removed while infant is sleeping. MOB became defensive and stated infant does not try to reach for them and that they are kept at infant's feet. CSW reiterated the risks of having stuffed animals in basinet with infant while asleep. MOB nodded in understanding. MOB denied any further questions or concerns for CSW at this time.    CSW Plan/Description:  No Further Intervention Required/No Barriers to Discharge, Perinatal Mood and Anxiety Disorder (PMADs) Education, Sudden Infant Death Syndrome (SIDS) Education    Grace Montgomery, [place] 06/20/2018, 9:11 AM

## 2018-06-21 LAB — GLUCOSE, CAPILLARY
Glucose-Capillary: 122 mg/dL — ABNORMAL HIGH (ref 70–99)
Glucose-Capillary: 78 mg/dL (ref 70–99)
Glucose-Capillary: 102 mg/dL — ABNORMAL HIGH (ref 70–99)
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
## 2018-06-21 NOTE — Progress Notes (Signed)
Subjective: Postpartum Day 1: Cesarean Delivery Patient reports feeling tired and having some soreness. Going back to [facility], would prefer to go back tomorrow. Not much of an appetite, no nausea.   Objective: Vital signs in last 24 hours: Temp:  [98.4 F (36.9 C)-98.6 F (37 C)] 98.6 F (37 C) (04/15 0645) Pulse Rate:  [90-101] 90 (04/15 0645) Resp:  [16-18] 16 (04/15 0645) BP: (109-135)/(55-73) 109/55 (04/15 0645) SpO2:  [99 %-100 %] 100 % (04/15 0645)  Physical Exam:  General: well-appearing, NAD, tired Lochia: appropriate Uterine Fundus: firm Incision: Prevena wound vac in place, appears to be functioning well  DVT Evaluation: 2+ non-pitting edema   Recent Labs    06/18/18 0800 06/20/18 0629  HGB 12.1 11.2*  HCT 35.9* 33.4*    Assessment/Plan: Status post Cesarean section. Doing well postoperatively.  - Continue current care - Rh(-) but infant also Rh(-) - no need for Rhogam  - Seen by social work - no barriers to discharge, returning to [facility]  Type II DM - Good control yesterday, though patient reports poor appetite. No need to adjust insulin regimen at this time. -- Lantus 18U + 6U AC + SSI  -- metformin 1000mg  BID  Grace Montgomery 06/21/2018, 7:07 AM

## 2018-06-21 NOTE — Progress Notes (Signed)
Pt sleeping when entering room. Reminded pt to order breakfast and to call out once tray arrives for glucose check. Pt refused assessment at this time. Pt stated "Im not ready for that yet". Will continue to monitor pt.
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
## 2018-06-22 LAB — GLUCOSE, CAPILLARY
Glucose-Capillary: 252 mg/dL — ABNORMAL HIGH (ref 70–99)
Glucose-Capillary: 58 mg/dL — ABNORMAL LOW (ref 70–99)
Glucose-Capillary: 90 mg/dL (ref 70–99)
Glucose-Capillary: 151 mg/dL — ABNORMAL HIGH (ref 70–99)

## 2018-06-22 MED ORDER — METFORMIN HCL 1000 MG PO TABS: 1000.0000 mg | ORAL_TABLET | Freq: Two times a day (BID) | ORAL | 0 refills | Status: DC

## 2018-06-22 MED ORDER — OXYCODONE HCL 5 MG PO TABS: 5.0000 mg | ORAL_TABLET | Freq: Four times a day (QID) | ORAL | 0 refills | Status: AC | PRN

## 2018-06-22 MED ORDER — SENNOSIDES-DOCUSATE SODIUM 8.6-50 MG PO TABS: 2.0000 | ORAL_TABLET | Freq: Every evening | ORAL | 0 refills | Status: DC | PRN

## 2018-06-22 NOTE — Progress Notes (Addendum)
CSW received consult due to score 16 on Edinburgh Depression Screen and a score of 2 for question 10.  CSW met with patient at bedside to complete assessment and offer support.   MOB propped up in bed holding infant when CSW entered the room. MOB expressed that she was feeling "ok" right now and that she was just ready to go home. CSW inquired about MOB's score on her [place] Depression Screen and inquired about her answer of "sometimes" to question 10 "The thought of harming myself has occurred to me." MOB reported "she did not mean to put that" and stated that she "wasn't really reading the questions when she was answering them". MOB denied any current thoughts of wanting to harm herself or having any thoughts of wanting to harm herself within the last 7 days. MOB confirmed she has a follow up appointment with her counselor on 4/24. CSW spoke with RN and requested that MOB's follow up appointment with her OBGYN be for 3 weeks instead of 6 weeks. RN agreeable to reaching out to [employer]. CSW left additional counseling resources and crisis numbers for MOB in the event she needed them. MOB will be returning to [facility] and is provided with a wealth of community resources for housing, mental health, parenting, and any other resources requested by MOB.  MOB also has a case manager that MOB meets with weekly at [facility] asked an additional time if MOB had any thoughts of wanting to harm herself to which MOB denied.  CSW reminded MOB to evaluate her mental health throughout the postpartum period with the use of the New Mom Checklist developed by Postpartum Progress as well as the [place] Postnatal Depression Scale and notify a medical professional if symptoms arise.    MOB denied any further questions or concerns for CSW at this time.  Ollen Barges, [place]  Women's and [employer] [phone]

## 2018-06-22 NOTE — Progress Notes (Signed)
Inpatient Diabetes Program Recommendations  AACE/ADA: New Consensus Statement on Inpatient Glycemic Control (2015)  Target Ranges:  Prepandial:   less than 140 mg/dL      Peak postprandial:   less than 180 mg/dL (1-2 hours)      Critically ill patients:  140 - 180 mg/dL   Lab Results  Component Value Date   GLUCAP 90 06/22/2018   HGBA1C 5.8 (H) 01/12/2018    Review of Glycemic Control Results for Grace Montgomery, Grace Montgomery (MRN [id number]) as of 06/22/2018 08:48  Ref. Range 06/21/2018 14:37 06/21/2018 18:23 06/22/2018 01:25 06/22/2018 02:11  Glucose-Capillary Latest Ref Range: 70 - 99 mg/dL 78 102 (H) 58 (L) 90   Diabetes history: type 2 DM Outpatient Diabetes medications: NPH 30 units BID, Novolog 24 units TID Current orders for Inpatient glycemic control: Metformin 1000 mg BID, Lantus 25 units QD, Novolog 6 units TID, Novolog 0-9 units TID, Novolog 0-5 units QHS  Inpatient Diabetes Program Recommendations:    Noted that patient experienced hypoglycemic event of 58 mg/dL following administration of meal coverage and bedtime correction, totalling Novolog 9 units. Meal coverage should not be given with bedtime correction. Additionally, per RN note, insulin timing was an issue as patient did not inform RN of meal, thus causing patient to receive more insulin from bedtime correction scale. To avoid this, discontinue QHS Novolog 0-5 units QHS.   Thanks, Bronson Curb, MSN, RNC-OB Diabetes Coordinator [phone] (8a-5p)

## 2018-06-22 NOTE — Progress Notes (Signed)
RN informed pt at beginning of shift to call when she received her meal tray, pt verbalized understanding. RN checked on pt at 20:00 and pt had not eaten dinner yet. Pt informed RN at 22:00 that she ate her meal "sometime after 21:00". RN reinforced that pt should call out before eating.  RN checked CBG at 22:30 but it did not transfer into Epic from glucometer. Subsequent CBGs did upload after using a different glucometer. CBG result was 252mg /dL at 22:30. 9 units of Novolog (3 for bedtime sliding scale, 6 for TID with meals) and Metformin given. Meal coverage sliding scale dose held since pt was receiving bedtime and meal doses simultaneously. CBG rechecked by RN at [place] - resulted 58mg /dL, pt stable. 4oz of juice given - CBG stabilized at 90mg /dL. RN will continue to monitor.  Gearldine Bienenstock, RN 06/22/2018 3:14 AM

## 2018-06-23 LAB — BPAM RBC
Blood Product Expiration Date: 202004162359
Blood Product Expiration Date: 202004162359
ISSUE DATE / TIME: 202004151303
ISSUE DATE / TIME: 202004160543
Unit Type and Rh: 600
Unit Type and Rh: 600

## 2018-06-23 LAB — TYPE AND SCREEN
ABO/RH(D): A NEG
Antibody Screen: POSITIVE
Unit division: 0
Unit division: 0

## 2018-06-24 ENCOUNTER — Other Ambulatory Visit: Payer: Self-pay

## 2018-06-24 ENCOUNTER — Encounter (HOSPITAL_COMMUNITY): Payer: Self-pay

## 2018-06-24 ENCOUNTER — Inpatient Hospital Stay (HOSPITAL_COMMUNITY)
Admission: AD | Admit: 2018-06-24 | Discharge: 2018-06-24 | Disposition: A | Discharge status: Home / Self Care | Payer: Medicaid Other | Attending: Obstetrics and Gynecology | Admitting: Obstetrics and Gynecology

## 2018-06-24 DIAGNOSIS — Z4889 Encounter for other specified surgical aftercare: Secondary | ICD-10-CM | POA: Diagnosis not present

## 2018-06-24 DIAGNOSIS — Z87891 Personal history of nicotine dependence: Secondary | ICD-10-CM | POA: Insufficient documentation

## 2018-06-24 DIAGNOSIS — Z4800 Encounter for change or removal of nonsurgical wound dressing: Secondary | ICD-10-CM

## 2018-06-24 DIAGNOSIS — Z886 Allergy status to analgesic agent status: Secondary | ICD-10-CM | POA: Insufficient documentation

## 2018-06-24 DIAGNOSIS — O9989 Other specified diseases and conditions complicating pregnancy, childbirth and the puerperium: Secondary | ICD-10-CM | POA: Insufficient documentation

## 2018-06-24 DIAGNOSIS — F319 Bipolar disorder, unspecified: Secondary | ICD-10-CM | POA: Insufficient documentation

## 2018-06-24 DIAGNOSIS — Z79899 Other long term (current) drug therapy: Secondary | ICD-10-CM | POA: Insufficient documentation

## 2018-06-24 DIAGNOSIS — E119 Type 2 diabetes mellitus without complications: Secondary | ICD-10-CM | POA: Diagnosis not present

## 2018-06-24 DIAGNOSIS — Z7984 Long term (current) use of oral hypoglycemic drugs: Secondary | ICD-10-CM | POA: Diagnosis not present

## 2018-06-24 DIAGNOSIS — J45909 Unspecified asthma, uncomplicated: Secondary | ICD-10-CM | POA: Insufficient documentation

## 2018-06-24 NOTE — MAU Provider Note (Signed)
Chief Complaint: Wound vac turned off   First Provider Initiated Contact with Patient 06/24/18 1746     SUBJECTIVE HPI: Grace Montgomery is a 28 y.o. [id number] at 5 days s/p cesarean who presents to Maternity Admissions reporting issues with her wound vac.  States last night the wound vac keeps turning off. Called after hours nurse & was told to come in for removal or replacement.  Denies fever/chills, abdominal pain, or wound drainage.   Past Medical History:  Diagnosis Date  . Anxiety   . Asthma   . Asthma, mild persistent 06/29/2011  . Bipolar disorder ([place])   . Cannabis dependence   . Diabetes mellitus without complication ([place])   . 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 Seasonal allergies 06/29/2011   On zyrtec OTC     OB History  Gravida Para Term Preterm AB Living  5 4 1 3 1 4   SAB TAB Ectopic Multiple Live Births  1 0 0 0 4    # Outcome Date GA Lbr Len/2nd Weight Sex Delivery Anes PTL Lv  5 Term 06/19/18 [redacted]w[redacted]d  3690 g M CS-LVertical EPI  LIV  4 Preterm 05/24/09        LIV  3 Preterm 06/04/08 [redacted]w[redacted]d    Vag-Spont  N LIV  2 Preterm 11/15/06 [redacted]w[redacted]d    Vag-Spont   LIV  1 SAB            Past Surgical History:  Procedure Laterality Date  . CESAREAN SECTION N/A 06/19/2018   Procedure: CESAREAN SECTION;  Surgeon: Osborne Oman, MD;  Location: [facility] LD ORS;  Service: Obstetrics;  Laterality: N/A;  . NO PAST SURGERIES     Social History   Socioeconomic History  . Marital status: Single    Spouse name: Not on file  . Number of children: 2  . Years of education: Not on file  . Highest education level: Not on file  Occupational History  . Occupation: [employer]  Social Needs  . Financial resource strain: Not on file  . Food insecurity:    Worry: Not on file    Inability: Not on file  . Transportation needs:    Medical: Not on file    Non-medical: Not on file  Tobacco Use  . Smoking status: Former Smoker    Packs/day: 0.25    Types: Cigarettes  .  Smokeless tobacco: Never Used  . Tobacco comment: quit November 2019  Substance and Sexual Activity  . Alcohol use: Not Currently    Comment: ocassionally  . Drug use: Not Currently    Comment: History of Marijuana  . Sexual activity: Not Currently    Birth control/protection: None  Lifestyle  . Physical activity:    Days per week: Not on file    Minutes per session: Not on file  . Stress: Not on file  Relationships  . Social connections:    Talks on phone: Not on file    Gets together: Not on file    Attends religious service: Not on file    Active member of club or organization: Not on file    Attends meetings of clubs or organizations: Not on file    Relationship status: Not on file  . Intimate partner violence:    Fear of current or ex partner: Not on file    Emotionally abused: Not on file    Physically abused: Not on file    Forced sexual activity: Not on  file  Other Topics Concern  . Not on file  Social History Narrative   Student for psychotherapy   2 Children - Boy(4yo) & Girl (3yo)         Family History  Problem Relation Age of Onset  . Sickle cell anemia Mother   . Depression Mother   . Diabetes Mother   . Diabetes Other        grandparents and aunts/uncles  . Stroke Other        grandparent   No current facility-administered medications on file prior to encounter.    Current Outpatient Medications on File Prior to Encounter  Medication Sig Dispense Refill  . acetaminophen (TYLENOL) 500 MG tablet Take 1 tablet (500 mg total) by mouth every 6 (six) hours as needed. 30 tablet 0  . albuterol (PROVENTIL HFA;VENTOLIN HFA) 108 (90 Base) MCG/ACT inhaler Inhale 1-2 puffs into the lungs every 6 (six) hours as needed for wheezing or shortness of breath.    . metFORMIN (GLUCOPHAGE) 1000 MG tablet Take 1 tablet (1,000 mg total) by mouth 2 (two) times daily with a meal. 60 tablet 0  . oxyCODONE (OXY IR/ROXICODONE) 5 MG immediate release tablet Take 1 tablet (5 mg  total) by mouth every 6 (six) hours as needed for up to 5 days for severe pain. 20 tablet 0  . pantoprazole (PROTONIX) 20 MG tablet Take 1 tablet (20 mg total) by mouth 2 (two) times daily. 60 tablet 2  . Prenat-FeAsp-Meth-FA-DHA w/o A (PRENATE PIXIE) 10-0.6-0.4-200 MG CAPS Take 1 capsule by mouth daily. 30 capsule 11  . senna-docusate (SENOKOT-S) 8.6-50 MG tablet Take 2 tablets by mouth at bedtime as needed for mild constipation. 20 tablet 0   Allergies  Allergen Reactions  . Monistat [Tioconazole] Swelling  . Chocolate Itching and Other (See Comments)    Ears & throat itch  . Ibuprofen Swelling    Swelling of the feet  . Banana Itching and Other (See Comments)    Throat itching    I have reviewed patient's Past Medical Hx, Surgical Hx, Family Hx, Social Hx, medications and allergies.   Review of Systems  Constitutional: Negative.   Gastrointestinal: Negative.   Genitourinary: Negative.     OBJECTIVE Patient Vitals for the past 24 hrs:  BP Temp Temp src Pulse Resp SpO2 Height Weight  06/24/18 1815 121/78 - - - - - - -  06/24/18 1724 (!) 118/59 97.9 F (36.6 C) Oral 99 18 99 % - -  06/24/18 1719 - - - - - - 5' 3.5" (1.613 m) 116.7 kg   Constitutional: Well-developed, well-nourished female in no acute distress.  Cardiovascular: normal rate & rhythm, no murmur Respiratory: normal rate and effort. Lung sounds clear throughout GI: surgical incision well healed & approximated MS: Extremities nontender, no edema, normal ROM Neurologic: Alert and oriented x 4.     LAB RESULTS No results found for this or any previous visit (from the past 24 hour(s)).  IMAGING No results found.  MAU COURSE Orders Placed This Encounter  Procedures  . Discharge patient   No orders of the defined types were placed in this encounter.   MDM C/w Dr. Rip Harbour. Will remove wound vac at this time, no need for replacement at this time.   Wound vac dressing removed without complications. Incision  is approximated & well healed. No signs of infection. Steri strips & abd pad applied.    ASSESSMENT 1. Encounter for change or removal of wound dressing  PLAN Discharge home in stable condition. Infection precautions  Allergies as of 06/24/2018      Reactions   Monistat [tioconazole] Swelling   Chocolate Itching, Other (See Comments)   Ears & throat itch   Ibuprofen Swelling   Swelling of the feet   Banana Itching, Other (See Comments)   Throat itching      Medication List    TAKE these medications   acetaminophen 500 MG tablet Commonly known as:  TYLENOL Take 1 tablet (500 mg total) by mouth every 6 (six) hours as needed.   albuterol 108 (90 Base) MCG/ACT inhaler Commonly known as:  VENTOLIN HFA Inhale 1-2 puffs into the lungs every 6 (six) hours as needed for wheezing or shortness of breath.   metFORMIN 1000 MG tablet Commonly known as:  GLUCOPHAGE Take 1 tablet (1,000 mg total) by mouth 2 (two) times daily with a meal.   oxyCODONE 5 MG immediate release tablet Commonly known as:  Oxy IR/ROXICODONE Take 1 tablet (5 mg total) by mouth every 6 (six) hours as needed for up to 5 days for severe pain.   pantoprazole 20 MG tablet Commonly known as:  Protonix Take 1 tablet (20 mg total) by mouth 2 (two) times daily.   Prenate Pixie 10-0.6-0.4-200 MG Caps Take 1 capsule by mouth daily.   senna-docusate 8.6-50 MG tablet Commonly known as:  Senokot-S Take 2 tablets by mouth at bedtime as needed for mild constipation.        Jorje Guild, NP 06/24/2018  6:18 PM

## 2018-06-24 NOTE — MAU Note (Signed)
Grace Montgomery is a 28 y.o. at Unknown here in MAU reporting: has c/s 5 days ago. States she has a wound vac and she feels like it is not pumping anymore. States she feels like maybe there is an odor but unsure if odor is from the incision or just "sweat". States she has not seen any bleeding or drainage from the incision. No pain  Onset of complaint: today  Pain score: 0/10  Vitals:   06/24/18 1724  BP: (!) 118/59  Pulse: 99  Resp: 18  Temp: 97.9 F (36.6 C)  SpO2: 99%     Lab orders placed from triage: none

## 2018-06-24 NOTE — Discharge Instructions (Signed)

## 2018-06-28 ENCOUNTER — Ambulatory Visit (INDEPENDENT_AMBULATORY_CARE_PROVIDER_SITE_OTHER): Payer: Medicaid Other | Admitting: Obstetrics & Gynecology

## 2018-06-28 ENCOUNTER — Other Ambulatory Visit: Payer: Self-pay

## 2018-06-28 ENCOUNTER — Encounter: Payer: Self-pay | Admitting: Obstetrics & Gynecology

## 2018-06-28 DIAGNOSIS — Z9889 Other specified postprocedural states: Secondary | ICD-10-CM

## 2018-06-28 NOTE — Progress Notes (Signed)
Patient here for incision check  C-Section on 06/19/18 Pt went to MAU on Sat wound vac was removed.  She has no complaints, with only light bleeding and minimal pain.  Blood pressure 117/77, pulse 82, temperature 98.6 F (37 C), temperature source Oral, weight 248 lb (112.5 kg), currently breastfeeding.  Incision is dry and intact, no dressing  Imp: doing well post-op from cesarean section Plan RTC 2 weeks for postpartum check   Woodroe Mode, MD 06/28/2018

## 2018-06-28 NOTE — Patient Instructions (Signed)

## 2018-06-30 ENCOUNTER — Other Ambulatory Visit: Payer: Self-pay

## 2018-06-30 ENCOUNTER — Ambulatory Visit: Payer: Medicaid Other | Attending: Family Medicine | Admitting: Family Medicine

## 2018-07-17 ENCOUNTER — Encounter: Payer: Self-pay | Admitting: Obstetrics and Gynecology

## 2018-07-17 ENCOUNTER — Ambulatory Visit (INDEPENDENT_AMBULATORY_CARE_PROVIDER_SITE_OTHER): Payer: Medicaid Other | Admitting: Obstetrics and Gynecology

## 2018-07-17 ENCOUNTER — Other Ambulatory Visit: Payer: Self-pay

## 2018-07-17 DIAGNOSIS — Z1389 Encounter for screening for other disorder: Secondary | ICD-10-CM

## 2018-07-17 DIAGNOSIS — Z30013 Encounter for initial prescription of injectable contraceptive: Secondary | ICD-10-CM | POA: Diagnosis not present

## 2018-07-17 DIAGNOSIS — Z3202 Encounter for pregnancy test, result negative: Secondary | ICD-10-CM | POA: Diagnosis not present

## 2018-07-17 LAB — POCT URINE PREGNANCY: Preg Test, Ur: NEGATIVE

## 2018-07-17 MED ORDER — MEDROXYPROGESTERONE ACETATE 150 MG/ML IM SUSP
150.0000 mg | Freq: Once | INTRAMUSCULAR | Status: AC
  Administered 2018-07-17: 150 mg via INTRAMUSCULAR

## 2018-07-17 MED ORDER — MEDROXYPROGESTERONE ACETATE 150 MG/ML IM SUSP: 150.0000 mg | INTRAMUSCULAR | 5 refills | Status: DC

## 2018-07-17 NOTE — Addendum Note (Signed)
Addended by: Tamela Oddi on: 07/17/2018 01:47 PM   Modules accepted: Orders

## 2018-07-17 NOTE — Progress Notes (Signed)
Subjective:     Grace Montgomery is a 28 y.o. female who presents for a postpartum visit. She is 4 weeks postpartum following a low cervical transverse Cesarean section. I have fully reviewed the prenatal and intrapartum course. The delivery was at 66 gestational weeks. Outcome: primary cesarean section, low transverse incision. Anesthesia: epidural. Postpartum course has been uncomplicated. Baby's course has been uncomplicated. Baby is feeding by bottle - Similac Advance. Bleeding no bleeding. Bowel function is normal. Bladder function is normal. Patient is not sexually active. Contraception method is abstinence. Postpartum depression screening: negative.     Review of Systems Pertinent items are noted in HPI.   Objective:    There were no vitals taken for this visit.  General:  alert, cooperative and no distress   Breasts:  inspection negative, no nipple discharge or bleeding, no masses or nodularity palpable  Lungs: clear to auscultation bilaterally  Heart:  regular rate and rhythm  Abdomen: soft, non-tender; bowel sounds normal; no masses,  no organomegaly. Incision healing well no erythema, induration or drainage   Vulva:  normal  Vagina: normal vagina, no discharge, exudate, lesion, or erythema  Cervix:  multiparous appearance  Corpus: normal  Adnexa:  normal adnexa  Rectal Exam: Not performed.        Assessment:    Normal postpartum exam. Pap smear not done at today's visit.   Plan:    1. Contraception: Depo-Provera injections 2. Patient is medically cleared to resume all activities of daily living 3. Follow up in: 6 months or as needed.

## 2018-07-17 NOTE — Progress Notes (Signed)
Administrations This Visit    medroxyPROGESTERone (DEPO-PROVERA) injection 150 mg    Admin Date 07/17/2018 Action Given Dose 150 mg Route Intramuscular Administered By Tamela Oddi, RMA         DEPO given in LD, tolerated well.   Next DEPO July 27- October 16, 2018

## 2018-07-26 ENCOUNTER — Other Ambulatory Visit: Payer: Self-pay

## 2018-07-26 DIAGNOSIS — O24112 Pre-existing diabetes mellitus, type 2, in pregnancy, second trimester: Secondary | ICD-10-CM

## 2018-07-26 DIAGNOSIS — E1169 Type 2 diabetes mellitus with other specified complication: Secondary | ICD-10-CM

## 2018-07-26 MED ORDER — METFORMIN HCL 1000 MG PO TABS: 1000.0000 mg | ORAL_TABLET | Freq: Two times a day (BID) | ORAL | 0 refills | Status: DC

## 2018-07-26 NOTE — Progress Notes (Signed)
Pt called and stated she cannot get in to see a PCP at this time to get her Metformin, she has called several and they are not taking any new patients at this time due to COVID 19. I advised pt that I will send one more refill of her metformin given these circumstances. Pt verbalizes understanding.

## 2018-08-01 NOTE — Progress Notes (Signed)
Patient was unable to be contacted for today's visit

## 2018-08-15 ENCOUNTER — Telehealth: Payer: Self-pay

## 2018-08-15 NOTE — Telephone Encounter (Signed)
Returned call and pt stated that she received depo on 07-17-18 and has been bleeding since 07-21-18 and wants to change to nexplanon. Advised that irregular bleeding can occur on depo, pt advised that she no longer wants to be on it and will contact scheduler when ready for appt.

## 2018-08-23 ENCOUNTER — Other Ambulatory Visit: Payer: Self-pay | Admitting: *Deleted

## 2018-08-23 DIAGNOSIS — E1169 Type 2 diabetes mellitus with other specified complication: Secondary | ICD-10-CM

## 2018-08-23 NOTE — Telephone Encounter (Signed)
Pt called to office stating that she has had constant heavy bleeding while on Depo, given in May. Pt states she feels she is bleeding too much, more that 1pad/hour.  Pt was advised to be seen at hospital if this is the case.  Pt states she cannot go to hospital as she lives in maternity home and does not have anyone to care for her child.   Pt states she was told she could request a pill to take to stop bleeding.   Pt states she also needs refill on her Metformin. Pt states she has not found an office that will schedule a new pt appt.   Pt made aware that message to be sent to provider for refill approval and bleeding management.   Please advise, send reply to clinical pool.

## 2018-08-25 ENCOUNTER — Other Ambulatory Visit: Payer: Self-pay | Admitting: Obstetrics and Gynecology

## 2018-08-25 DIAGNOSIS — E1169 Type 2 diabetes mellitus with other specified complication: Secondary | ICD-10-CM

## 2018-08-25 MED ORDER — MEGESTROL ACETATE 40 MG PO TABS: 40.0000 mg | ORAL_TABLET | Freq: Two times a day (BID) | ORAL | 5 refills | Status: DC

## 2018-08-25 NOTE — Addendum Note (Signed)
Addended by: Maryruth Eve on: 08/25/2018 08:50 AM   Modules accepted: Orders

## 2018-08-25 NOTE — Telephone Encounter (Signed)
Pt informed

## 2018-08-28 MED ORDER — METFORMIN HCL 1000 MG PO TABS: 1000.0000 mg | ORAL_TABLET | Freq: Two times a day (BID) | ORAL | 0 refills | Status: DC

## 2018-08-29 NOTE — Telephone Encounter (Signed)
Pt.notified

## 2018-09-18 ENCOUNTER — Encounter: Payer: Self-pay | Admitting: Family Medicine

## 2018-09-18 DIAGNOSIS — Z87891 Personal history of nicotine dependence: Secondary | ICD-10-CM

## 2018-09-18 DIAGNOSIS — Z720 Tobacco use: Secondary | ICD-10-CM

## 2018-09-18 HISTORY — DX: Tobacco use: Z72.0

## 2018-09-18 HISTORY — DX: Personal history of nicotine dependence: Z87.891

## 2018-09-21 ENCOUNTER — Ambulatory Visit (INDEPENDENT_AMBULATORY_CARE_PROVIDER_SITE_OTHER): Payer: Medicaid Other | Admitting: Family Medicine

## 2018-09-21 ENCOUNTER — Encounter: Payer: Self-pay | Admitting: Family Medicine

## 2018-09-21 ENCOUNTER — Other Ambulatory Visit: Payer: Self-pay

## 2018-09-21 VITALS — BP 118/68 | HR 80 | Wt 242.0 lb

## 2018-09-21 DIAGNOSIS — Z720 Tobacco use: Secondary | ICD-10-CM

## 2018-09-21 DIAGNOSIS — J45990 Exercise induced bronchospasm: Secondary | ICD-10-CM

## 2018-09-21 DIAGNOSIS — E1169 Type 2 diabetes mellitus with other specified complication: Secondary | ICD-10-CM

## 2018-09-21 DIAGNOSIS — F3170 Bipolar disorder, currently in remission, most recent episode unspecified: Secondary | ICD-10-CM

## 2018-09-21 DIAGNOSIS — Z3009 Encounter for other general counseling and advice on contraception: Secondary | ICD-10-CM

## 2018-09-21 DIAGNOSIS — O9081 Anemia of the puerperium: Secondary | ICD-10-CM | POA: Diagnosis not present

## 2018-09-21 DIAGNOSIS — F332 Major depressive disorder, recurrent severe without psychotic features: Secondary | ICD-10-CM

## 2018-09-21 DIAGNOSIS — E1165 Type 2 diabetes mellitus with hyperglycemia: Secondary | ICD-10-CM

## 2018-09-21 LAB — POCT GLYCOSYLATED HEMOGLOBIN (HGB A1C): HbA1c, POC (controlled diabetic range): 9.3 % — AB (ref 0.0–7.0)

## 2018-09-21 MED ORDER — EMPAGLIFLOZIN 25 MG PO TABS: 25.0000 mg | ORAL_TABLET | Freq: Every day | ORAL | 3 refills | Status: DC

## 2018-09-21 MED ORDER — ALBUTEROL SULFATE HFA 108 (90 BASE) MCG/ACT IN AERS: 2.0000 | INHALATION_SPRAY | Freq: Four times a day (QID) | RESPIRATORY_TRACT | 5 refills | Status: DC | PRN

## 2018-09-21 MED ORDER — METFORMIN HCL 1000 MG PO TABS: 1000.0000 mg | ORAL_TABLET | Freq: Two times a day (BID) | ORAL | 3 refills | Status: DC

## 2018-09-21 NOTE — Patient Instructions (Addendum)
Your Hemoglobin A1c (measures how well your blood sugar has been controlled in last 3 months) was high at 9.3%.  We recommend it be less than 7.5%.   Continue Metformin 1000 mg tablet Twice a day Start Jardiance 25 mg tablet once each morning (makes body pee out sugar, also causes some weight loss.) Pick up your Albuterol Inhaler at the pharmacy.    Dr Zona Pedro will call you if your tests are not good. Otherwise he will send you a letter.  If you sign up for MyChart online, you will be able to see your test results once Dr Imanie Darrow has reviewed them.  If you do not hear from [place] with in 2 weeks please call our office

## 2018-09-22 ENCOUNTER — Encounter: Payer: Self-pay | Admitting: Family Medicine

## 2018-09-22 DIAGNOSIS — F319 Bipolar disorder, unspecified: Secondary | ICD-10-CM

## 2018-09-22 HISTORY — DX: Bipolar disorder, unspecified: F31.9

## 2018-09-22 LAB — LIPID PANEL
Chol/HDL Ratio: 2.4 ratio (ref 0.0–4.4)
Cholesterol, Total: 99 mg/dL — ABNORMAL LOW (ref 100–199)
HDL: 42 mg/dL (ref >39)
LDL Calculated: 35 mg/dL (ref 0–99)
Triglycerides: 110 mg/dL (ref 0–149)
VLDL Cholesterol Cal: 22 mg/dL (ref 5–40)

## 2018-09-22 LAB — BASIC METABOLIC PANEL
BUN/Creatinine Ratio: 5 — ABNORMAL LOW (ref 9–23)
BUN: 5 mg/dL — ABNORMAL LOW (ref 6–20)
CO2: 18 mmol/L — ABNORMAL LOW (ref 20–29)
Calcium: 9.6 mg/dL (ref 8.7–10.2)
Chloride: 107 mmol/L — ABNORMAL HIGH (ref 96–106)
Creatinine, Ser: 1.02 mg/dL — ABNORMAL HIGH (ref 0.57–1.00)
GFR calc Af Amer: 86 mL/min/{1.73_m2} (ref >59)
GFR calc non Af Amer: 75 mL/min/{1.73_m2} (ref >59)
Glucose: 144 mg/dL — ABNORMAL HIGH (ref 65–99)
Potassium: 3.9 mmol/L (ref 3.5–5.2)
Sodium: 141 mmol/L (ref 134–144)

## 2018-09-22 LAB — CBC
Hematocrit: 35.5 % (ref 34.0–46.6)
Hemoglobin: 12.6 g/dL (ref 11.1–15.9)
MCH: 30.2 pg (ref 26.6–33.0)
MCHC: 35.5 g/dL (ref 31.5–35.7)
MCV: 85 fL (ref 79–97)
Platelets: 348 10*3/uL (ref 150–450)
RBC: 4.17 x10E6/uL (ref 3.77–5.28)
RDW: 13.9 % (ref 11.7–15.4)
WBC: 7.1 10*3/uL (ref 3.4–10.8)

## 2018-09-22 LAB — MICROALBUMIN / CREATININE URINE RATIO
Creatinine, Urine: 248.6 mg/dL
Microalb/Creat Ratio: 21 mg/g creat (ref 0–29)
Microalbumin, Urine: 52.7 ug/mL

## 2018-09-22 MED ORDER — LEVONORGEST-ETH ESTRAD 91-DAY 0.15-0.03 MG PO TABS: 1.0000 | ORAL_TABLET | Freq: Every day | ORAL | 4 refills | Status: DC

## 2018-09-22 NOTE — Assessment & Plan Note (Addendum)
>>  ASSESSMENT AND PLAN FOR TYPE 2 DIABETES MELLITUS WITHOUT COMPLICATION, WITHOUT LONG-TERM CURRENT USE OF INSULIN (HCC) WRITTEN ON 09/22/2018 11:42 AM BY Nicolet Griffy D, MD  Established problem Uncontrolled on metformin 1000 mg bid A1c 9.3% Start empagliflozine 25 mg daily Continue metformin 1000 mg BID Diet and increase activity RTC 3 months     >>ASSESSMENT AND PLAN FOR TYPE 2 DIABETES MELLITUS WITH MORBID OBESITY (HCC) WRITTEN ON 09/22/2018 12:06 PM BY Alysiah Suppa D, MD  Established problem May see weight loss with start SGLT2 - inhibition.

## 2018-09-22 NOTE — Progress Notes (Addendum)
Subjective:    Patient ID: Grace Montgomery, female    DOB: 1990/04/10, 28 y.o.   MRN: [id number] KARRISA DIDIO is alone Sources of clinical information for visit is/are patient and their [facility] intake sheet and [facility] new patient health history sheet  Nursing assessment for this office visit was reviewed with the patient for accuracy and revision.   Previous Report(s) Reviewed: see above  Depression screen PHQ 2/9 09/21/2018  Decreased Interest 0  Down, Depressed, Hopeless 0  PHQ - 2 Score 0   Fall Risk  04/11/2018 03/13/2018 02/13/2018 01/26/2018  Falls in the past year? 0 0 0 0    History/P.E. limitations: none  Adult vaccines due  Topic Date Due  . PNEUMOCOCCAL POLYSACCHARIDE VACCINE AGE 38-64 HIGH RISK  07/21/1991  . TETANUS/TDAP  04/19/2028    Diabetes Health Maintenance Due  Topic Date Due  . FOOT EXAM  07/21/1999  . OPHTHALMOLOGY EXAM  07/21/1999  . URINE MICROALBUMIN  07/21/1999  . HEMOGLOBIN A1C  03/24/2019    Health Maintenance Due  Topic Date Due  . PNEUMOCOCCAL POLYSACCHARIDE VACCINE AGE 38-64 HIGH RISK  07/21/1991  . FOOT EXAM  07/21/1999  . OPHTHALMOLOGY EXAM  07/21/1999  . URINE MICROALBUMIN  07/21/1999     Chief Complaint  Patient presents with  . Establish Care  . Diabetes     HPI  Diabetes Mellitus Type 2 - Onset a year ago.  Has had gestational diabetes. - Home CBGs running primarily in higher 200s.  Lowest 93.  No hypoglycemic episodes.  - Has been on metformin 1000 mg BID.  Report more frequent BMs with metformin.   - No chest pain, no change in vision, no claudication - Has started watching sweets in her diet.  She would like to lose weight.  - Active within apartment - getting it into shape since they have just moved into there.  - No formal exercise.  - Prior A1c 5.8% 01/2018.   Complains of insomnia - Longstanding - Goes bed later than 11pm, sleeps 6 hours on average.  - Naps with her infant during day.  Denies sleepingess - Epworth  sleepiness scale 5 out of 28 (normal < 5) - Stop-bang 3 points (tired, BMI > 35, neck circumference)  Contraception - 2nd MPA injection will be due in mid August.  - She dn like weight gain with MPA and both heavy bleeds and spotting with it - On Megace by OBGYN to slow bleeding on MPA.  Still on Megace 40 mg daily.  - DN want implant of MPA like hormone bc of concern with vag bleeding.  - DN want IUD bc of "gut" reaction against it - Would consider a extended-cycle combination oral contraceptive regiment - No hisotry blood clots, no fh blood clots. Not breast feding. No migraine.  - No use of contraception prior to this MPA injection postpartum - (+) heterosexually active  SH: (+) Tobacco use  Review of Systems No fever No cough See intake 11 item ROS which was only (+) for sleep difficulties    Objective:   Physical Exam VS reviewed GEN: Alert, Cooperative, Groomed, NAD COR: RRR, No M/G/R LUNGS: BCTA, No Acc mm use, speaking in full sentences Gait: Normal speed, No significant path deviation, Step through +,  Psych: Normal affect/thought/speech/language  Diabetic Foot Exam - Simple   Simple Foot Form Diabetic Foot exam was performed with the following findings: Yes 09/20/2018 11:00 AM  Visual Inspection No deformities, no ulcerations, no other  skin breakdown bilaterally: Yes Sensation Testing Intact to touch and monofilament testing bilaterally: Yes Pulse Check Posterior Tibialis and Dorsalis pulse intact bilaterally: Yes Comments       Assessment & Plan:

## 2018-09-22 NOTE — Assessment & Plan Note (Addendum)
Need to continue addressing. Offering resources contemplative

## 2018-09-22 NOTE — Assessment & Plan Note (Signed)
Once current MPA expired mid-August - start trial of extended cycle combination oral contraception

## 2018-09-22 NOTE — Assessment & Plan Note (Addendum)
Established problem May see weight loss with start SGLT2 - inhibition.

## 2018-09-22 NOTE — Assessment & Plan Note (Signed)
Established problem Controlled Continue current therapy regiment prn albuterol MDI 2 puff prn prior to exertion.

## 2018-09-25 ENCOUNTER — Encounter: Payer: Self-pay | Admitting: Family Medicine

## 2018-10-04 ENCOUNTER — Ambulatory Visit: Payer: Medicaid Other

## 2018-10-06 ENCOUNTER — Other Ambulatory Visit: Payer: Self-pay

## 2018-10-06 ENCOUNTER — Emergency Department (HOSPITAL_COMMUNITY)
Admission: EM | Admit: 2018-10-06 | Discharge: 2018-10-06 | Discharge status: Against Medical Advice | Payer: Medicaid Other | Attending: Emergency Medicine | Admitting: Emergency Medicine

## 2018-10-06 DIAGNOSIS — Z5321 Procedure and treatment not carried out due to patient leaving prior to being seen by health care provider: Secondary | ICD-10-CM | POA: Insufficient documentation

## 2018-10-06 DIAGNOSIS — M25561 Pain in right knee: Secondary | ICD-10-CM | POA: Diagnosis present
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
## 2018-10-06 NOTE — ED Notes (Signed)
RN and this tech witnessed Pt walking out attempted to stop, but was unable to do so.

## 2018-10-06 NOTE — ED Provider Notes (Cosign Needed)
Patient eloped prior to my evaluation, which was 21 minutes after she had been checked into the ER.  Informed by staff that she walked out of the department.  She told staff she just wanted a knee sleeve which she thought EMS would give her.   Franchot Heidelberg, PA-C 10/06/18 1626

## 2018-10-09 ENCOUNTER — Other Ambulatory Visit: Payer: Self-pay

## 2018-10-09 ENCOUNTER — Emergency Department (HOSPITAL_COMMUNITY): Payer: Medicaid Other

## 2018-10-09 ENCOUNTER — Encounter (HOSPITAL_COMMUNITY): Payer: Self-pay

## 2018-10-09 ENCOUNTER — Inpatient Hospital Stay (HOSPITAL_COMMUNITY)
Admission: AD | Admit: 2018-10-09 | Discharge: 2018-10-12 | DRG: 885 | Disposition: A | Discharge status: Home / Self Care | Payer: Medicaid Other | Source: Intra-hospital | Attending: Psychiatry | Admitting: Psychiatry

## 2018-10-09 ENCOUNTER — Emergency Department (HOSPITAL_COMMUNITY)
Admission: EM | Admit: 2018-10-09 | Discharge: 2018-10-09 | Disposition: A | Discharge status: Home / Self Care | Payer: Medicaid Other | Attending: Emergency Medicine | Admitting: Emergency Medicine

## 2018-10-09 DIAGNOSIS — Z79890 Hormone replacement therapy (postmenopausal): Secondary | ICD-10-CM | POA: Diagnosis not present

## 2018-10-09 DIAGNOSIS — Z888 Allergy status to other drugs, medicaments and biological substances status: Secondary | ICD-10-CM | POA: Diagnosis not present

## 2018-10-09 DIAGNOSIS — Z832 Family history of diseases of the blood and blood-forming organs and certain disorders involving the immune mechanism: Secondary | ICD-10-CM | POA: Diagnosis not present

## 2018-10-09 DIAGNOSIS — F17210 Nicotine dependence, cigarettes, uncomplicated: Secondary | ICD-10-CM | POA: Insufficient documentation

## 2018-10-09 DIAGNOSIS — Z91018 Allergy to other foods: Secondary | ICD-10-CM

## 2018-10-09 DIAGNOSIS — F332 Major depressive disorder, recurrent severe without psychotic features: Secondary | ICD-10-CM | POA: Diagnosis present

## 2018-10-09 DIAGNOSIS — Z833 Family history of diabetes mellitus: Secondary | ICD-10-CM | POA: Diagnosis not present

## 2018-10-09 DIAGNOSIS — Z813 Family history of other psychoactive substance abuse and dependence: Secondary | ICD-10-CM | POA: Diagnosis not present

## 2018-10-09 DIAGNOSIS — F419 Anxiety disorder, unspecified: Secondary | ICD-10-CM | POA: Diagnosis present

## 2018-10-09 DIAGNOSIS — Z818 Family history of other mental and behavioral disorders: Secondary | ICD-10-CM

## 2018-10-09 DIAGNOSIS — J4530 Mild persistent asthma, uncomplicated: Secondary | ICD-10-CM | POA: Insufficient documentation

## 2018-10-09 DIAGNOSIS — F1010 Alcohol abuse, uncomplicated: Secondary | ICD-10-CM | POA: Diagnosis present

## 2018-10-09 DIAGNOSIS — E119 Type 2 diabetes mellitus without complications: Secondary | ICD-10-CM | POA: Diagnosis present

## 2018-10-09 DIAGNOSIS — R45851 Suicidal ideations: Secondary | ICD-10-CM | POA: Diagnosis present

## 2018-10-09 DIAGNOSIS — Z825 Family history of asthma and other chronic lower respiratory diseases: Secondary | ICD-10-CM

## 2018-10-09 DIAGNOSIS — Z79899 Other long term (current) drug therapy: Secondary | ICD-10-CM

## 2018-10-09 DIAGNOSIS — Z9114 Patient's other noncompliance with medication regimen: Secondary | ICD-10-CM | POA: Insufficient documentation

## 2018-10-09 DIAGNOSIS — Z915 Personal history of self-harm: Secondary | ICD-10-CM

## 2018-10-09 DIAGNOSIS — Z794 Long term (current) use of insulin: Secondary | ICD-10-CM | POA: Insufficient documentation

## 2018-10-09 DIAGNOSIS — J302 Other seasonal allergic rhinitis: Secondary | ICD-10-CM | POA: Diagnosis present

## 2018-10-09 DIAGNOSIS — F1220 Cannabis dependence, uncomplicated: Secondary | ICD-10-CM | POA: Diagnosis present

## 2018-10-09 DIAGNOSIS — Z590 Homelessness: Secondary | ICD-10-CM | POA: Diagnosis not present

## 2018-10-09 DIAGNOSIS — Z886 Allergy status to analgesic agent status: Secondary | ICD-10-CM

## 2018-10-09 DIAGNOSIS — Z20828 Contact with and (suspected) exposure to other viral communicable diseases: Secondary | ICD-10-CM | POA: Insufficient documentation

## 2018-10-09 DIAGNOSIS — G4700 Insomnia, unspecified: Secondary | ICD-10-CM | POA: Diagnosis present

## 2018-10-09 DIAGNOSIS — Z823 Family history of stroke: Secondary | ICD-10-CM | POA: Diagnosis not present

## 2018-10-09 DIAGNOSIS — M25561 Pain in right knee: Secondary | ICD-10-CM | POA: Insufficient documentation

## 2018-10-09 DIAGNOSIS — Y999 Unspecified external cause status: Secondary | ICD-10-CM | POA: Insufficient documentation

## 2018-10-09 DIAGNOSIS — W108XXA Fall (on) (from) other stairs and steps, initial encounter: Secondary | ICD-10-CM | POA: Insufficient documentation

## 2018-10-09 DIAGNOSIS — Y929 Unspecified place or not applicable: Secondary | ICD-10-CM | POA: Insufficient documentation

## 2018-10-09 DIAGNOSIS — Z811 Family history of alcohol abuse and dependence: Secondary | ICD-10-CM

## 2018-10-09 DIAGNOSIS — Z7984 Long term (current) use of oral hypoglycemic drugs: Secondary | ICD-10-CM

## 2018-10-09 DIAGNOSIS — Y9301 Activity, walking, marching and hiking: Secondary | ICD-10-CM | POA: Insufficient documentation

## 2018-10-09 DIAGNOSIS — W19XXXA Unspecified fall, initial encounter: Secondary | ICD-10-CM

## 2018-10-09 HISTORY — DX: Other symptoms and signs involving emotional state: R45.89

## 2018-10-09 LAB — I-STAT BETA HCG BLOOD, ED (MC, WL, AP ONLY): I-stat hCG, quantitative: <5 m[IU]/mL (ref <5)

## 2018-10-09 LAB — SALICYLATE LEVEL: Salicylate Lvl: <7 mg/dL (ref 2.8–30.0)

## 2018-10-09 LAB — CBC WITH DIFFERENTIAL/PLATELET
Abs Immature Granulocytes: 0.04 10*3/uL (ref 0.00–0.07)
Basophils Absolute: 0 10*3/uL (ref 0.0–0.1)
Basophils Relative: 0 %
Eosinophils Absolute: 0.1 10*3/uL (ref 0.0–0.5)
Eosinophils Relative: 1 %
HCT: 38.6 % (ref 36.0–46.0)
Hemoglobin: 12.1 g/dL (ref 12.0–15.0)
Immature Granulocytes: 0 %
Lymphocytes Relative: 15 %
Lymphs Abs: 1.6 10*3/uL (ref 0.7–4.0)
MCH: 28.8 pg (ref 26.0–34.0)
MCHC: 31.3 g/dL (ref 30.0–36.0)
MCV: 91.9 fL (ref 80.0–100.0)
Monocytes Absolute: 0.6 10*3/uL (ref 0.1–1.0)
Monocytes Relative: 6 %
Neutro Abs: 8.1 10*3/uL — ABNORMAL HIGH (ref 1.7–7.7)
Neutrophils Relative %: 78 %
Platelets: 321 10*3/uL (ref 150–400)
RBC: 4.2 MIL/uL (ref 3.87–5.11)
RDW: 14.3 % (ref 11.5–15.5)
WBC: 10.5 10*3/uL (ref 4.0–10.5)
nRBC: 0 % (ref 0.0–0.2)

## 2018-10-09 LAB — RAPID URINE DRUG SCREEN, HOSP PERFORMED
Amphetamines: NOT DETECTED
Barbiturates: NOT DETECTED
Benzodiazepines: NOT DETECTED
Cocaine: NOT DETECTED
Opiates: NOT DETECTED
Tetrahydrocannabinol: POSITIVE — AB

## 2018-10-09 LAB — SARS CORONAVIRUS 2 BY RT PCR (HOSPITAL ORDER, PERFORMED IN ~~LOC~~ HOSPITAL LAB): SARS Coronavirus 2: NEGATIVE

## 2018-10-09 LAB — COMPREHENSIVE METABOLIC PANEL
ALT: 18 U/L (ref 0–44)
AST: 15 U/L (ref 15–41)
Albumin: 3.9 g/dL (ref 3.5–5.0)
Alkaline Phosphatase: 68 U/L (ref 38–126)
Anion gap: 10 (ref 5–15)
BUN: 7 mg/dL (ref 6–20)
CO2: 20 mmol/L — ABNORMAL LOW (ref 22–32)
Calcium: 9.2 mg/dL (ref 8.9–10.3)
Chloride: 109 mmol/L (ref 98–111)
Creatinine, Ser: 0.83 mg/dL (ref 0.44–1.00)
GFR calc Af Amer: >60 mL/min (ref >60)
GFR calc non Af Amer: >60 mL/min (ref >60)
Glucose, Bld: 168 mg/dL — ABNORMAL HIGH (ref 70–99)
Potassium: 3.7 mmol/L (ref 3.5–5.1)
Sodium: 139 mmol/L (ref 135–145)
Total Bilirubin: 0.5 mg/dL (ref 0.3–1.2)
Total Protein: 7.3 g/dL (ref 6.5–8.1)

## 2018-10-09 LAB — ETHANOL: Alcohol, Ethyl (B): <10 mg/dL (ref <10)

## 2018-10-09 LAB — ACETAMINOPHEN LEVEL: Acetaminophen (Tylenol), Serum: <10 ug/mL — ABNORMAL LOW (ref 10–30)

## 2018-10-09 MED ORDER — METFORMIN HCL 500 MG PO TABS
1000.0000 mg | ORAL_TABLET | Freq: Two times a day (BID) | ORAL | Status: DC
  Administered 2018-10-09: 1000 mg via ORAL
  Filled 2018-10-09: qty 2

## 2018-10-09 MED ORDER — ACETAMINOPHEN 500 MG PO TABS
500.0000 mg | ORAL_TABLET | Freq: Four times a day (QID) | ORAL | Status: DC | PRN
  Administered 2018-10-09: 500 mg via ORAL
  Filled 2018-10-09: qty 1

## 2018-10-09 NOTE — ED Notes (Signed)
Pt dressed out in paper scrubs and belongings collected and placed behind triage nurses station

## 2018-10-09 NOTE — ED Notes (Signed)
Pt requests a few minutes ot get something from car.

## 2018-10-09 NOTE — Progress Notes (Signed)
Orthopedic Tech Progress Note Patient Details:  Grace Montgomery Sep 28, 1990 [id number] Pt said she already knows how to use crutches. Ortho Devices Type of Ortho Device: Knee Immobilizer, Crutches Ortho Device/Splint Location: Rt knee Ortho Device/Splint Interventions: Application, Adjustment   Post Interventions Patient Tolerated: Well Instructions Provided: Adjustment of device, Care of device   Ladell Pier [facility] 10/09/2018, 10:36 AM

## 2018-10-09 NOTE — Care Management (Signed)
Per [facility], Grace Montgomery) - patient accepted to [facility] Bed 401-2.  Dr. Mallie Darting is the accepting.  The number to give report is [phone].  The patient can come after 10"30pm.  Writer informed the nurse Sydnee Cabal).

## 2018-10-09 NOTE — ED Notes (Signed)
Pt provided lunch tray.
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
## 2018-10-09 NOTE — BH Assessment (Signed)
[facility] Assessment Progress Note   Case was staffed with Rankin NP who recommended a inpatient admission to assist with stabilization.    

## 2018-10-09 NOTE — ED Provider Notes (Signed)
Grace Montgomery DEPT Provider Note   CSN: [id number] Arrival date & time: 10/09/18  0751     History   Chief Complaint Chief Complaint  Patient presents with  . Depression  . Knee Pain    HPI Grace Montgomery is a 28 y.o. female.     The history is provided by the patient and medical records. No language interpreter was used.  Mental Health Problem Presenting symptoms: suicidal thoughts and suicidal threats   Presenting symptoms: no agitation, no hallucinations, no homicidal ideas and no suicide attempt (near)   Degree of incapacity (severity):  Severe Onset quality:  Gradual Duration:  3 days Timing:  Intermittent Progression:  Waxing and waning Chronicity:  Recurrent Context: not medication   Treatment compliance:  Untreated Relieved by:  Nothing Worsened by:  Nothing Ineffective treatments:  None tried Associated symptoms: no abdominal pain, no anxiety, no chest pain, no fatigue and no headaches   Risk factors: hx of mental illness and hx of suicide attempts     Past Medical History:  Diagnosis Date  . Anxiety   . Asthma   . Asthma, mild persistent 06/29/2011  . Bipolar affective disorder ([place]) 09/22/2018  . Bipolar disorder ([place])   . Cannabis dependence   . Diabetes mellitus without complication (Grace Montgomery)   . 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 Rh negative state in antepartum period 03/02/2018   Will need rho gam  . S/P emergency cesarean section 06/18/2018  . Seasonal allergies 06/29/2011   On zyrtec OTC    . Supervision of high risk pregnancy, antepartum 01/12/2018    Nursing Staff Provider Office Location  [facility] Dating  [place] and 9.1 week [place] 12/08/18 Language  english Anatomy [place]   02/13/18 Flu Vaccine  Declined 01/12/18 Genetic Screen  NIPS: low risk  AFP: neg   TDaP vaccine   04/19/2018 Hgb A1C or  GTT Early  Third  trimester  Rhogam  04/19/2018   LAB RESULTS  Feeding Plan both Blood Type --/--/A NEG (03/13 0947) A NEG  Contraception  condoms Antibody POS (03  . Tobacco use 09/18/2018  . Type 2 diabetes mellitus complicating pregnancy in third trimester, antepartum 01/05/2018   Current Diabetic Medications:  Insulin  [x]  Aspirin 81 mg daily after 12 weeks (? A2/B GDM)  For A2/B GDM or higher classes of DM [x]  Diabetes Education and Testing Supplies [x]  Nutrition Counsult [ ]  Fetal ECHO after 20 weeks - ordered 12/26 [ ]  Eye exam for retina evaluation   Baseline and surveillance labs (pulled in from EPIC, refresh links as needed)  Lab Results Component Value Date  CREATIN    Patient Active Problem List   Diagnosis Date Noted  . Morbid obesity (Grace Montgomery) 09/22/2018  . Bipolar affective disorder (Grace Montgomery) 09/22/2018  . Tobacco use 09/18/2018  . Poorly controlled type 2 diabetes mellitus (Grace Montgomery) 11/06/2016  . Contraception management 08/22/2012  . Maternal morbid obesity, 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Grace Montgomery) 06/29/2011  . Exercise-induced asthma 21-Dec-1989    Past Surgical History:  Procedure Laterality Date  . CESAREAN SECTION N/A 06/19/2018   Procedure: CESAREAN SECTION;  Surgeon: Osborne Oman, MD;  Location: [facility];  Service: Obstetrics;  Laterality: N/A;  . NO PAST SURGERIES       OB History    Gravida  5   Para  4   Term  1   Preterm  3   AB  1   Living  4     SAB  1   TAB  0   Ectopic  0   Multiple  0   Live Births  4            Home Medications    Prior to Admission medications   Medication Sig Start Date End Date Taking? Authorizing Provider  albuterol (VENTOLIN HFA) 108 (90 Base) MCG/ACT inhaler Inhale 2 puffs into the lungs every 6 (six) hours as needed for wheezing or shortness of breath. 09/21/18   McDiarmid, Blane Ohara, MD  empagliflozin (JARDIANCE) 25 MG TABS tablet Take 25 mg by mouth daily. 09/21/18   McDiarmid, Blane Ohara, MD  levonorgestrel-ethinyl estradiol (SEASONALE) 0.15-0.03 MG tablet  Take 1 tablet by mouth daily. 10/11/18   McDiarmid, Blane Ohara, MD  medroxyPROGESTERone (DEPO-PROVERA) 150 MG/ML injection Inject 1 mL (150 mg total) into the muscle every 3 (three) months. 07/17/18   Constant, Peggy, MD  megestrol (MEGACE) 40 MG tablet Take 1 tablet (40 mg total) by mouth 2 (two) times daily. Can increase to two tablets twice a day in the event of heavy bleeding 08/25/18   Constant, Vickii Chafe, MD  metFORMIN (GLUCOPHAGE) 1000 MG tablet Take 1 tablet (1,000 mg total) by mouth 2 (two) times daily with a meal. 09/21/18   McDiarmid, Blane Ohara, MD  Prenat-FeAsp-Meth-FA-DHA w/o A (PRENATE PIXIE) 10-0.6-0.4-200 MG CAPS Take 1 capsule by mouth daily. 01/24/18   Shelly Bombard, MD    Family History Family History  Problem Relation Age of Onset  . Sickle cell anemia Mother   . Depression Mother   . Diabetes Mother   . Asthma Mother   . Diabetes Other        grandparents and aunts/uncles  . Stroke Other        grandparent  . Alcohol abuse Father   . Depression Father   . Alcohol abuse Brother   . Drug abuse Brother     Social History Social History   Tobacco Use  . Smoking status: Current Some Day Smoker    Packs/day: 0.25    Types: Cigarettes  . Smokeless tobacco: Never Used  . Tobacco comment: quit November 2019  Substance Use Topics  . Alcohol use: Not Currently    Comment: ocassionally  . Drug use: Not Currently    Comment: History of Marijuana     Allergies   Monistat [tioconazole], Chocolate, Ibuprofen, and Banana   Review of Systems Review of Systems  Constitutional: Negative for chills, diaphoresis, fatigue and fever.  HENT: Negative for congestion.   Respiratory: Negative for cough, chest tightness, shortness of breath and wheezing.   Cardiovascular: Negative for chest pain, palpitations and leg swelling.  Gastrointestinal: Negative for abdominal pain, constipation, diarrhea, nausea and vomiting.  Genitourinary: Negative for dysuria.  Musculoskeletal: Negative  for back pain, neck pain and neck stiffness.  Skin: Negative for rash and wound.  Neurological: Negative for light-headedness and headaches.  Psychiatric/Behavioral: Positive for suicidal ideas. Negative for agitation, hallucinations and homicidal ideas. The patient is not nervous/anxious.      Physical Exam Updated Vital Signs BP (!) 125/94 (BP Location: Right Arm)   Pulse (!) 117   Temp 98.8 F (37.1 C) (Oral)   Resp 20   Ht 5\' 3"  (1.6 m)   Wt 111.1 kg   SpO2 98%   BMI 43.40 kg/m   Physical Exam Vitals signs and nursing note reviewed.  Constitutional:  General: She is not in acute distress.    Appearance: She is well-developed. She is obese. She is not ill-appearing, toxic-appearing or diaphoretic.  HENT:     Head: Normocephalic and atraumatic.     Nose: No congestion or rhinorrhea.     Mouth/Throat:     Mouth: Mucous membranes are moist.     Pharynx: No oropharyngeal exudate or posterior oropharyngeal erythema.  Eyes:     Conjunctiva/sclera: Conjunctivae normal.     Pupils: Pupils are equal, round, and reactive to light.  Neck:     Musculoskeletal: Neck supple.  Cardiovascular:     Rate and Rhythm: Normal rate and regular rhythm.     Heart sounds: No murmur.  Pulmonary:     Effort: Pulmonary effort is normal. No respiratory distress.     Breath sounds: Normal breath sounds. No stridor. No wheezing, rhonchi or rales.  Chest:     Chest wall: No tenderness.  Abdominal:     Palpations: Abdomen is soft.     Tenderness: There is no abdominal tenderness. There is no right CVA tenderness or left CVA tenderness.  Musculoskeletal:        General: Tenderness present. No swelling, deformity or signs of injury.     Right knee: She exhibits bony tenderness. She exhibits normal range of motion, no swelling, no ecchymosis, no deformity, no laceration, no erythema and normal patellar mobility. Tenderness found. Medial joint line, lateral joint line and patellar tendon  tenderness noted.     Right lower leg: No edema.       Legs:  Skin:    General: Skin is warm and dry.     Capillary Refill: Capillary refill takes less than 2 seconds.  Neurological:     General: No focal deficit present.     Mental Status: She is alert and oriented to person, place, and time.     Sensory: No sensory deficit.     Motor: No weakness.      ED Treatments / Results  Labs (all labs ordered are listed, but only abnormal results are displayed) Labs Reviewed  CBC WITH DIFFERENTIAL/PLATELET - Abnormal; Notable for the following components:      Result Value   Neutro Abs 8.1 (*)    All other components within normal limits  COMPREHENSIVE METABOLIC PANEL  ETHANOL  RAPID URINE DRUG SCREEN, HOSP PERFORMED  SALICYLATE LEVEL  ACETAMINOPHEN LEVEL  I-STAT BETA HCG BLOOD, ED ([facility], [facility], [facility] ONLY)    EKG EKG Interpretation  Date/Time:  Monday October 09 2018 09:04:07 EDT Ventricular Rate:  101 PR Interval:    QRS Duration: 81 QT Interval:  323 QTC Calculation: 419 R Axis:   68 Text Interpretation:  Sinus tachycardia Probable left atrial enlargement Baseline wander in lead(s) V6 When compared to prior, no significant changes seen,.  No STEMI Confirmed by Antony Blackbird [phone]) on 10/09/2018 9:09:24 AM   Radiology No results found.  Procedures Procedures (including critical care time)  Medications Ordered in ED Medications - No data to display   Initial Impression / Assessment and Plan / ED Course  I have reviewed the triage vital signs and the nursing notes.  Pertinent labs & imaging results that were available during my care of the patient were reviewed by me and considered in my medical decision making (see chart for details).        Grace Montgomery is a 28 y.o. female with a past medical history significant for bipolar disorder, asthma, diabetes, and  C-section almost 4 months ago who presents for recent near suicide attempt with suicidal ideation and right  knee pain after a fall.  Patient reports that 3 days ago, she was feeling very suicidal and nearly killed herself.  She reports that her plan was to overdose on muscle relaxants.  Family called law enforcement with a stop her from overdosing and the patient has a feel member who is a mental health nurse who stayed with the patient.  Patient reports she is not feeling suicidal currently but is concerned that she almost killed herself several days ago.  She was told to come in to be evaluated and may need admission by psychiatry.  She reports that 4 days ago she fell while going up the stairs and hurt her right knee.  She reports pain when she tries to ambulate.  She has an Ace bandage around it and is able to ambulate with pain.  She denies numbness, tingling, weakness.  She reports that her knee several years ago with similar pain.  On exam, patient has pain in her right knee with palpation and bending.  She has palpable DP and PT pulse in the right leg.  No calf tenderness.  Normal sensation and strength in the leg.  No hip tenderness.  Lungs clear and chest and back nontender.  Abdomen nontender.  Patient denies SI or HI currently but reports that she nearly killed her self several days ago.  For both of patient's complaints today, will get x-rays for the right knee as well as knee immobilizer and crutches.  Anticipate a ligamentous injury and she will need to follow-up with orthopedics for this.  For the patient's suicidal ideation recently and near suicide attempt, will get screening labs and consult TTS.  Anticipate following up on their recommendations.  Patient is medically cleared based on reassuring lab work-up.  Psychiatry recommends inpatient management and coronavirus test was ordered per the protocol.  Knee x-ray shows no fracture dislocation.  Patient placed in knee immobilizer with crutches.  Anticipate orthopedic follow-up for this in the future.  Anticipate inpatient psychiatric management.    Final Clinical Impressions(s) / ED Diagnoses   Final diagnoses:  Suicidal ideation  Fall, initial encounter  Acute pain of right knee   Clinical Impression: 1. Suicidal ideation   2. Fall, initial encounter   3. Acute pain of right knee     Disposition: Awaiting further psychiatric management.  This note was prepared with assistance of Systems analyst. Occasional wrong-word or sound-a-like substitutions may have occurred due to the inherent limitations of voice recognition software.     Tegeler, Gwenyth Allegra, MD 10/09/18 [phone]

## 2018-10-09 NOTE — BHH Counselor (Signed)
Tele-psych cart currently in use with providers. Will assess patient when cart becomes available. Clock stopped on assessment.

## 2018-10-10 ENCOUNTER — Encounter (HOSPITAL_COMMUNITY): Payer: Self-pay

## 2018-10-10 DIAGNOSIS — F332 Major depressive disorder, recurrent severe without psychotic features: Principal | ICD-10-CM

## 2018-10-10 LAB — GLUCOSE, CAPILLARY
Glucose-Capillary: 155 mg/dL — ABNORMAL HIGH (ref 70–99)
Glucose-Capillary: 146 mg/dL — ABNORMAL HIGH (ref 70–99)

## 2018-10-10 MED ORDER — TRAZODONE HCL 50 MG PO TABS
50.0000 mg | ORAL_TABLET | Freq: Every evening | ORAL | Status: DC | PRN
  Administered 2018-10-10 – 2018-10-11 (×2): 50 mg via ORAL
  Filled 2018-10-10: qty 7
  Filled 2018-10-10: qty 1

## 2018-10-10 MED ORDER — ALUM & MAG HYDROXIDE-SIMETH 200-200-20 MG/5ML PO SUSP: 30.0000 mL | ORAL | Status: DC | PRN

## 2018-10-10 MED ORDER — CITALOPRAM HYDROBROMIDE 10 MG PO TABS
10.0000 mg | ORAL_TABLET | Freq: Every day | ORAL | Status: DC
  Administered 2018-10-10 – 2018-10-11 (×2): 10 mg via ORAL
  Filled 2018-10-10 (×3): qty 1

## 2018-10-10 MED ORDER — HYDROXYZINE HCL 25 MG PO TABS
25.0000 mg | ORAL_TABLET | Freq: Three times a day (TID) | ORAL | Status: DC | PRN
  Administered 2018-10-10 – 2018-10-11 (×2): 25 mg via ORAL
  Filled 2018-10-10: qty 1
  Filled 2018-10-10: qty 10

## 2018-10-10 MED ORDER — MAGNESIUM HYDROXIDE 400 MG/5ML PO SUSP: 15.0000 mL | Freq: Every day | ORAL | Status: DC | PRN

## 2018-10-10 MED ORDER — METFORMIN HCL 500 MG PO TABS
1000.0000 mg | ORAL_TABLET | Freq: Two times a day (BID) | ORAL | Status: DC
  Administered 2018-10-10 – 2018-10-12 (×4): 1000 mg via ORAL
  Filled 2018-10-10 (×10): qty 2

## 2018-10-10 MED ORDER — ACETAMINOPHEN 325 MG PO TABS
650.0000 mg | ORAL_TABLET | Freq: Four times a day (QID) | ORAL | Status: DC | PRN
  Administered 2018-10-10: 650 mg via ORAL
  Filled 2018-10-10: qty 2
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
## 2018-10-10 NOTE — Progress Notes (Signed)
Pt states she needs her medication. Pt states in order for her to eat her breakfast she need to take her medication with her meal. Pt states she is a diabetes  and she need her medicine early morning. RN said there is no diabetes medication for pt this am.

## 2018-10-10 NOTE — BHH Group Notes (Signed)
Adult Psychoeducational Group Note  Date:  10/10/2018 Time:  8:45 PM  Group Topic/Focus:  Wrap-Up Group:   The focus of this group is to help patients review their daily goal of treatment and discuss progress on daily workbooks.  Participation Level:  Active  Participation Quality:  Appropriate and Attentive  Affect:  Appropriate  Cognitive:  Alert and Appropriate  Insight: Appropriate and Good  Engagement in Group:  Engaged  Modes of Intervention:  Discussion and Education  Additional Comments:  Pt attended and participated in wrap up group this evening and rated their day an 8/10. Pt completed their goal, which was to get their medications.   Cristi Loron 10/10/2018, 8:45 PM
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
## 2018-10-10 NOTE — Progress Notes (Signed)
Nursing Progress Note: 7p-7a D: Pt currently presents with a needy/labile/blaming others affect and behavior. Interacting appropriately with the milieu. Pt reports good sleep during the previous night with current medication regimen.  A: Pt provided with medications per providers orders. Pt's labs and vitals were monitored throughout the night. Pt supported emotionally and encouraged to express concerns and questions. Pt educated on medications.  R: Pt's safety ensured with 15 minute and environmental checks. Pt currently denies SI, HI, and AVH. Pt verbally contracts to seek staff if SI,HI, or AVH occurs and to consult with staff before acting on any harmful thoughts. Will continue to monitor.   [place] NOVEL CORONAVIRUS (COVID-19) DAILY CHECK-OFF SYMPTOMS - answer yes or no to each - every day NO YES  Have you had a fever in the past 24 hours?  . Fever (Temp > 37.80C / 100F) X   Have you had any of these symptoms in the past 24 hours? . New Cough .  Sore Throat  .  Shortness of Breath .  Difficulty Breathing .  Unexplained Body Aches   X   Have you had any one of these symptoms in the past 24 hours not related to allergies?   . Runny Nose .  Nasal Congestion .  Sneezing   X   If you have had runny nose, nasal congestion, sneezing in the past 24 hours, has it worsened?  X   EXPOSURES - check yes or no X   Have you traveled outside the state in the past 14 days?  X   Have you been in contact with someone with a confirmed diagnosis of COVID-19 or PUI in the past 14 days without wearing appropriate PPE?  X   Have you been living in the same home as a person with confirmed diagnosis of COVID-19 or a PUI (household contact)?    X   Have you been diagnosed with COVID-19?    X              What to do next: Answered NO to all: Answered YES to anything:   Proceed with unit schedule Follow the [facility] Inpatient Flowsheet.

## 2018-10-10 NOTE — [facility] Group Notes (Signed)
LCSW Group Therapy Note 10/10/2018 2:31 PM  Type of Therapy and Topic: Group Therapy: Overcoming Obstacles  Participation Level: Did Not Attend  Description of Group:  In this group patients will be encouraged to explore what they see as obstacles to their own wellness and recovery. They will be guided to discuss their thoughts, feelings, and behaviors related to these obstacles. The group will process together ways to cope with barriers, with attention given to specific choices patients can make. Each patient will be challenged to identify changes they are motivated to make in order to overcome their obstacles. This group will be process-oriented, with patients participating in exploration of their own experiences as well as giving and receiving support and challenge from other group members.  Therapeutic Goals: 1. Patient will identify personal and current obstacles as they relate to admission. 2. Patient will identify barriers that currently interfere with their wellness or overcoming obstacles.  3. Patient will identify feelings, thought process and behaviors related to these barriers. 4. Patient will identify two changes they are willing to make to overcome these obstacles:   Summary of Patient Progress  Patient was invited, however she was asleep in her room.  Patient did not attend.   Therapeutic Modalities:  Cognitive Behavioral Therapy Solution Focused Therapy Motivational Interviewing Relapse Prevention Therapy   Theresa Duty Clinical Social Worker
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
## 2018-10-10 NOTE — Plan of Care (Signed)
Progress note  D: pt found in bed; allowed to rest after assessment. Pt states that she wasn't suicidal this time coming in, but had been leading up to this. Pt states she needs medication management and came in because her prozac wasn't working. Pt is fixated on her blood sugar and metformin. Pt denies any pain. Pt is visibly anxious but provided reassurance. Pt denies si/hi/ah/vh and verbally agrees to approach staff if these become apparent or before harming herself/others while at [facility]: Pt provided support and encouragement. Pt given medication per protocol and standing orders. Q78m safety checks implemented and continued.  R: Pt safe on the unit. Will continue to monitor.  Pt progressing in the following metrics  Problem: Education: Goal: Knowledge of  General Education information/materials will improve Outcome: Progressing Goal: Emotional status will improve Outcome: Progressing Goal: Mental status will improve Outcome: Progressing Goal: Verbalization of understanding the information provided will improve Outcome: Progressing
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
## 2018-10-11 LAB — HEMOGLOBIN A1C
Hgb A1c MFr Bld: 8 % — ABNORMAL HIGH (ref 4.8–5.6)
Mean Plasma Glucose: 182.9 mg/dL

## 2018-10-11 LAB — GLUCOSE, CAPILLARY
Glucose-Capillary: 223 mg/dL — ABNORMAL HIGH (ref 70–99)
Glucose-Capillary: 123 mg/dL — ABNORMAL HIGH (ref 70–99)

## 2018-10-11 LAB — TSH: TSH: 3.062 u[IU]/mL (ref 0.350–4.500)

## 2018-10-11 MED ORDER — INSULIN ASPART 100 UNIT/ML ~~LOC~~ SOLN
0.0000 [IU] | Freq: Three times a day (TID) | SUBCUTANEOUS | Status: DC
  Administered 2018-10-11: 3 [IU] via SUBCUTANEOUS
  Administered 2018-10-12: 2 [IU] via SUBCUTANEOUS

## 2018-10-11 MED ORDER — CITALOPRAM HYDROBROMIDE 20 MG PO TABS
20.0000 mg | ORAL_TABLET | Freq: Every day | ORAL | Status: DC
  Administered 2018-10-12: 09:00:00 20 mg via ORAL
  Filled 2018-10-11: qty 1
  Filled 2018-10-11: qty 7
  Filled 2018-10-11: qty 1

## 2018-10-11 NOTE — Tx Team (Signed)
Interdisciplinary Treatment and Diagnostic Plan Update  10/11/2018 Time of Session:  Grace Montgomery MRN: [id number]  Principal Diagnosis: <principal problem not specified>  Secondary Diagnoses: Active Problems:   Severe recurrent major depression without psychotic features ([facility])   Current Medications:  Current Facility-Administered Medications  Medication Dose Route Frequency Provider Last Rate Last Dose  . acetaminophen (TYLENOL) tablet 650 mg  650 mg Oral Q6H PRN Lindon Romp A, NP   650 mg at 10/10/18 1705  . alum & mag hydroxide-simeth (MAALOX/MYLANTA) 200-200-20 MG/5ML suspension 30 mL  30 mL Oral Q4H PRN Lindon Romp A, NP      . citalopram (CELEXA) tablet 10 mg  10 mg Oral Daily Cobos, Myer Peer, MD   10 mg at 10/11/18 0802  . hydrOXYzine (ATARAX/VISTARIL) tablet 25 mg  25 mg Oral TID PRN Lindon Romp A, NP   25 mg at 10/10/18 2059  . magnesium hydroxide (MILK OF MAGNESIA) suspension 15 mL  15 mL Oral Daily PRN Lindon Romp A, NP      . metFORMIN (GLUCOPHAGE) tablet 1,000 mg  1,000 mg Oral BID WC Cobos, Myer Peer, MD   1,000 mg at 10/11/18 0802  . traZODone (DESYREL) tablet 50 mg  50 mg Oral QHS PRN Lindon Romp A, NP   50 mg at 10/10/18 2059   PTA Medications: Medications Prior to Admission  Medication Sig Dispense Refill Last Dose  . albuterol (VENTOLIN HFA) 108 (90 Base) MCG/ACT inhaler Inhale 2 puffs into the lungs every 6 (six) hours as needed for wheezing or shortness of breath. 18 g 5   . empagliflozin (JARDIANCE) 25 MG TABS tablet Take 25 mg by mouth daily. 90 tablet 3   . levonorgestrel-ethinyl estradiol (SEASONALE) 0.15-0.03 MG tablet Take 1 tablet by mouth daily. 1 Package 4   . medroxyPROGESTERone (DEPO-PROVERA) 150 MG/ML injection Inject 1 mL (150 mg total) into the muscle every 3 (three) months. (Patient not taking: Reported on 10/09/2018) 1 mL 5   . megestrol (MEGACE) 40 MG tablet Take 1 tablet (40 mg total) by mouth 2 (two) times daily. Can increase to two tablets  twice a day in the event of heavy bleeding (Patient not taking: Reported on 10/09/2018) 60 tablet 5   . metFORMIN (GLUCOPHAGE) 1000 MG tablet Take 1 tablet (1,000 mg total) by mouth 2 (two) times daily with a meal. 180 tablet 3   . Prenat-FeAsp-Meth-FA-DHA w/o A (PRENATE PIXIE) 10-0.6-0.4-200 MG CAPS Take 1 capsule by mouth daily. (Patient not taking: Reported on 10/09/2018) 30 capsule 11     Patient Stressors:    Patient Strengths:    Treatment Modalities: Medication Management, Group therapy, Case management,  1 to 1 session with clinician, Psychoeducation, Recreational therapy.   Physician Treatment Plan for Primary Diagnosis: <principal problem not specified> Long Term Goal(s): Improvement in symptoms so as ready for discharge Improvement in symptoms so as ready for discharge   Short Term Goals: Ability to identify changes in lifestyle to reduce recurrence of condition will improve Ability to verbalize feelings will improve Ability to disclose and discuss suicidal ideas Ability to demonstrate self-control will improve Ability to identify and develop effective coping behaviors will improve  Medication Management: Evaluate patient's response, side effects, and tolerance of medication regimen.  Therapeutic Interventions: 1 to 1 sessions, Unit Group sessions and Medication administration.  Evaluation of Outcomes: Not Met  Physician Treatment Plan for Secondary Diagnosis: Active Problems:   Severe recurrent major depression without psychotic features ([place])  Long Term Goal(s): Improvement  in symptoms so as ready for discharge Improvement in symptoms so as ready for discharge   Short Term Goals: Ability to identify changes in lifestyle to reduce recurrence of condition will improve Ability to verbalize feelings will improve Ability to disclose and discuss suicidal ideas Ability to demonstrate self-control will improve Ability to identify and develop effective coping behaviors will  improve     Medication Management: Evaluate patient's response, side effects, and tolerance of medication regimen.  Therapeutic Interventions: 1 to 1 sessions, Unit Group sessions and Medication administration.  Evaluation of Outcomes: Not Met   RN Treatment Plan for Primary Diagnosis: <principal problem not specified> Long Term Goal(s): Knowledge of disease and therapeutic regimen to maintain health will improve  Short Term Goals: Ability to demonstrate self-control, Ability to participate in decision making will improve, Ability to verbalize feelings will improve, Ability to disclose and discuss suicidal ideas, Ability to identify and develop effective coping behaviors will improve and Compliance with prescribed medications will improve  Medication Management: RN will administer medications as ordered by provider, will assess and evaluate patient's response and provide education to patient for prescribed medication. RN will report any adverse and/or side effects to prescribing provider.  Therapeutic Interventions: 1 on 1 counseling sessions, Psychoeducation, Medication administration, Evaluate responses to treatment, Monitor vital signs and CBGs as ordered, Perform/monitor CIWA, COWS, AIMS and Fall Risk screenings as ordered, Perform wound care treatments as ordered.  Evaluation of Outcomes: Not Met   LCSW Treatment Plan for Primary Diagnosis: <principal problem not specified> Long Term Goal(s): Safe transition to appropriate next level of care at discharge, Engage patient in therapeutic group addressing interpersonal concerns.  Short Term Goals: Engage patient in aftercare planning with referrals and resources  Therapeutic Interventions: Assess for all discharge needs, 1 to 1 time with Social worker, Explore available resources and support systems, Assess for adequacy in community support network, Educate family and significant other(s) on suicide prevention, Complete Psychosocial  Assessment, Interpersonal group therapy.  Evaluation of Outcomes: Progressing   Progress in Treatment: Attending groups: Yes. Participating in groups: Yes. Taking medication as prescribed: Yes. Toleration medication: Yes. Family/Significant other contact made: Yes, individual(s) contacted:  the patient's family friend (child's grandmother) Patient understands diagnosis: Yes. Discussing patient identified problems/goals with staff: Yes. Medical problems stabilized or resolved: Yes. Denies suicidal/homicidal ideation: Yes. Issues/concerns per patient self-inventory: No. Other:   New problem(s) identified: None   New Short Term/Long Term Goal(s): medication stabilization, elimination of SI thoughts, development of comprehensive mental wellness plan.    Patient Goals:   Discharge Plan or Barriers:  Patient plans to discharge home. She will follow up with [facility] for outpatient medication management and [facility] for therapy services. CSW will continue to follow for any additional referrals and discharge planning.   Reason for Continuation of Hospitalization: Anxiety Depression Medication stabilization  Estimated Length of Stay: 3-5 days   Attendees: Patient: 10/11/2018 9:24 AM  Physician: Dr. Neita Garnet, MD 10/11/2018 9:24 AM  Nursing: Estill Bamberg.Loletha Grayer, RN 10/11/2018 9:24 AM  RN Care Manager: 10/11/2018 9:24 AM  Social Worker: Radonna Ricker, [place] 10/11/2018 9:24 AM  Recreational Therapist:  10/11/2018 9:24 AM  Other:  10/11/2018 9:24 AM  Other:  10/11/2018 9:24 AM  Other: 10/11/2018 9:24 AM    Scribe for Treatment Team: Marylee Floras, [place] 10/11/2018 9:24 AM

## 2018-10-11 NOTE — Progress Notes (Signed)
Inpatient Diabetes Program Recommendations  AACE/ADA: New Consensus Statement on Inpatient Glycemic Control (2015)  Target Ranges:  Prepandial:   less than 140 mg/dL      Peak postprandial:   less than 180 mg/dL (1-2 hours)      Critically ill patients:  140 - 180 mg/dL   Lab Results  Component Value Date   GLUCAP 146 (H) 10/10/2018   HGBA1C 8.0 (H) 10/11/2018    Review of Glycemic Control  Diabetes history: DM2 Outpatient Diabetes medications: metformin 1000 mg bid, Jardiance 25 mg QD (has not started) Current orders for Inpatient glycemic control: metformin 1000 mg bid  HgbA1C - 8.0%  Inpatient Diabetes Program Recommendations:     Add Novolog 0-9 units tidwc and hs  Will follow daily.  Thank you. Lorenda Peck, RD, LDN, CDE Inpatient Diabetes Coordinator [phone]

## 2018-10-11 NOTE — Progress Notes (Signed)
[place] NOVEL CORONAVIRUS (COVID-19) DAILY CHECK-OFF SYMPTOMS - answer yes or no to each - every day NO YES  Have you had a fever in the past 24 hours?  . Fever (Temp > 37.80C / 100F) X   Have you had any of these symptoms in the past 24 hours? . New Cough .  Sore Throat  .  Shortness of Breath .  Difficulty Breathing .  Unexplained Body Aches   X   Have you had any one of these symptoms in the past 24 hours not related to allergies?   . Runny Nose .  Nasal Congestion .  Sneezing   X   If you have had runny nose, nasal congestion, sneezing in the past 24 hours, has it worsened?  X   EXPOSURES - check yes or no X   Have you traveled outside the state in the past 14 days?  X   Have you been in contact with someone with a confirmed diagnosis of COVID-19 or PUI in the past 14 days without wearing appropriate PPE?  X   Have you been living in the same home as a person with confirmed diagnosis of COVID-19 or a PUI (household contact)?    X   Have you been diagnosed with COVID-19?    X              What to do next: Answered NO to all: Answered YES to anything:   Proceed with unit schedule Follow the [facility] Inpatient Flowsheet.   

## 2018-10-11 NOTE — Plan of Care (Signed)
  Problem: Education: Goal: Knowledge of [place] [facility] Education information/materials will improve Outcome: Progressing   Problem: Activity: Goal: Interest or engagement in activities will improve Outcome: Progressing   Problem: Health Behavior/Discharge Planning: Goal: Compliance with treatment plan for underlying cause of condition will improve Outcome: Progressing   Problem: Safety: Goal: Periods of time without injury will increase Outcome: Progressing   

## 2018-10-11 NOTE — [facility] Group Notes (Signed)
Adult Psychoeducational Group Note  Date:  10/11/2018 Time:  8:32 PM  Group Topic/Focus:  Wrap-Up Group:   The focus of this group is to help patients review their daily goal of treatment and discuss progress on daily workbooks.  Participation Level:  Active  Participation Quality:  Appropriate and Attentive  Affect:  Appropriate  Cognitive:  Alert and Appropriate  Insight: Appropriate and Good  Engagement in Group:  Engaged  Modes of Intervention:  Discussion and Education  Additional Comments:  Pt attended and participated in wrap up group and rated their day a 10/10. Pt completed their goal, which was to fell better about themselves.   Cristi Loron 10/11/2018, 8:32 PM

## 2018-10-11 NOTE — Progress Notes (Signed)
[facility] MD Progress Note  10/11/2018 2:25 PM Grace Montgomery  MRN:  [id number] Subjective: Patient reports some improvement compared to admission.  States "I have been catching up on my sleep".  She states she has decided not to contact or speak with her boyfriend "until we can go to family therapy".  States she does continue to have communication with her mother-in-law, who is currently taking care of her child.  She denies medication side effects.  Today denies any suicidal ideations. Objective: I have discussed case with treatment team and have met with patient. 28 year old female with a history of depression, alcohol use disorder, presented for depression and suicidal thoughts of overdosing .  She is 4 months postpartum.  (Mother-in-law is currently taking care of her child).  She attributed depression in part to strained relationship with her boyfriend/child's father whom she states has been distant and uninvolved.  History of prior psychiatric admission 3 years ago for depression/suicidal ideations.  Has been diagnosed with MDD in the past.   Today patient reports feeling better than she did on admission.  Affect remains vaguely restricted/subtly irritable, but tends to improve during session.  Denies suicidal ideations.  She is visible in dayroom, interactive with selective peers, no disruptive or agitated behaviors. Denies medication side effects.  Currently on Celexa 10 mg daily. CBG 146, plasma glucose 182, hemoglobin A1c 8.0, TSH 3.06 EKG NSR, sinus tach at 101, QTc 419. Regarding alcohol, states she had stopped drinking heavily a year ago and had last consumed alcohol 3 weeks ago.  Currently she is not presenting with symptoms of alcohol withdrawal  Principal Problem: Depression Diagnosis: Active Problems:   Severe recurrent major depression without psychotic features ([place])  Total Time spent with patient: 20 minutes  Past Psychiatric History:   Past Medical History:  Past Medical  History:  Diagnosis Date  . Anxiety   . Asthma   . Asthma, mild persistent 06/29/2011  . Bipolar affective disorder ([place]) 09/22/2018  . Bipolar disorder ([place])   . Cannabis dependence   . Diabetes mellitus without complication ([place])   . 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 MDD (major depressive disorder), recurrent severe, without psychosis ([place]) 02/16/2017  . Rh negative state in antepartum period 03/02/2018   Will need rho gam  . S/P emergency cesarean section 06/18/2018  . Seasonal allergies 06/29/2011   On zyrtec OTC    . Suicidal behavior    2013, 2018  . Supervision of high risk pregnancy, antepartum 01/12/2018    Nursing Staff Provider Office Location  [facility] Dating  [place] and 9.1 week [place] 12/08/18 Language  english Anatomy US   02/13/18 Flu Vaccine  Declined 01/12/18 Genetic Screen  NIPS: low risk  AFP: neg   TDaP vaccine   04/19/2018 Hgb A1C or  GTT Early  Third trimester  Rhogam  04/19/2018   LAB RESULTS  Feeding Plan both Blood Type --/--/A NEG (03/13 0947) A NEG  Contraception  condoms Antibody POS (03  . Tobacco use 09/18/2018  . Type 2 diabetes mellitus complicating pregnancy in third trimester, antepartum 01/05/2018   Current Diabetic Medications:  Insulin  '[x]'$  Aspirin 81 mg daily after 12 weeks (? A2/B GDM)  For A2/B GDM or higher classes of DM '[x]'$  Diabetes Education and Testing Supplies '[x]'$  Nutrition Counsult '[ ]'$  Fetal ECHO after 20 weeks - ordered 12/26 '[ ]'$  Eye exam for retina evaluation   Baseline and surveillance labs (pulled in from EPIC,  refresh links as needed)  Lab Results Component Value Date  CREATIN    Past Surgical History:  Procedure Laterality Date  . CESAREAN SECTION N/A 06/19/2018   Procedure: CESAREAN SECTION;  Surgeon: Osborne Oman, MD;  Location: [facility] LD [facility]S;  Service: Obstetrics;  Laterality: N/A;  . NO PAST SURGERIES     Family History:  Family History  Problem Relation Age of Onset  . Sickle cell anemia Mother   . Depression  Mother   . Diabetes Mother   . Asthma Mother   . Diabetes Other        grandparents and aunts/uncles  . Stroke Other        grandparent  . Alcohol abuse Father   . Depression Father   . Alcohol abuse Brother   . Drug abuse Brother    Family Psychiatric  History:  Social History:  Social History   Substance and Sexual Activity  Alcohol Use Not Currently   Comment: ocassionally     Social History   Substance and Sexual Activity  Drug Use Not Currently   Comment: History of Marijuana    Social History   Socioeconomic History  . Marital status: Single    Spouse name: Not on file  . Number of children: 2  . Years of education: 60  . Highest education level: 11th grade  Occupational History  . Occupation: [employer]  Social Needs  . Financial resource strain: Not on file  . Food insecurity    Worry: Not on file    Inability: Not on file  . Transportation needs    Medical: Not on file    Non-medical: Not on file  Tobacco Use  . Smoking status: Current Some Day Smoker    Packs/day: 0.25    Types: Cigarettes  . Smokeless tobacco: Never Used  . Tobacco comment: quit November 2019  Substance and Sexual Activity  . Alcohol use: Not Currently    Comment: ocassionally  . Drug use: Not Currently    Comment: History of Marijuana  . Sexual activity: Not Currently    Partners: Male    Birth control/protection: None  Lifestyle  . Physical activity    Days per week: 3 days    Minutes per session: Not on file  . Stress: Not on file  Relationships  . Social Herbalist on phone: Not on file    Gets together: Not on file    Attends religious service: Not on file    Active member of club or organization: Not on file    Attends meetings of clubs or organizations: Not on file    Relationship status: Not on file  Other Topics Concern  . Not on file  Social History Narrative   09/2018 Living independently in subsidized apartment    FOB lives nearby to patient.  He  helps some with child care.      Patient has 3 Children    Friend assists with transportation   Britny Riel would make decisions for pt if she were unable   Ms Asberry reports feeling safe in her relationships   Additional Social History:   Sleep: Good  Appetite:  Good  Current Medications: Current Facility-Administered Medications  Medication Dose Route Frequency Provider Last Rate Last Dose  . acetaminophen (TYLENOL) tablet 650 mg  650 mg Oral Q6H PRN Lindon Romp A, NP   650 mg at 10/10/18 1705  . alum & mag hydroxide-simeth (MAALOX/MYLANTA) 200-200-20 MG/5ML suspension 30  mL  30 mL Oral Q4H PRN Lindon Romp A, NP      . citalopram (CELEXA) tablet 10 mg  10 mg Oral Daily Keondre Markson, Myer Peer, MD   10 mg at 10/11/18 0802  . hydrOXYzine (ATARAX/VISTARIL) tablet 25 mg  25 mg Oral TID PRN Lindon Romp A, NP   25 mg at 10/10/18 2059  . magnesium hydroxide (MILK OF MAGNESIA) suspension 15 mL  15 mL Oral Daily PRN Lindon Romp A, NP      . metFORMIN (GLUCOPHAGE) tablet 1,000 mg  1,000 mg Oral BID WC Shakeem Stern, Myer Peer, MD   1,000 mg at 10/11/18 0802  . traZODone (DESYREL) tablet 50 mg  50 mg Oral QHS PRN Rozetta Nunnery, NP   50 mg at 10/10/18 2059    Lab Results:  Results for orders placed or performed during the hospital encounter of 10/09/18 (from the past 48 hour(s))  Glucose, capillary     Status: Abnormal   Collection Time: 10/10/18 10:53 AM  Result Value Ref Range   Glucose-Capillary 155 (H) 70 - 99 mg/dL   Comment 1 Notify RN    Comment 2 Document in Chart   Glucose, capillary     Status: Abnormal   Collection Time: 10/10/18  8:26 PM  Result Value Ref Range   Glucose-Capillary 146 (H) 70 - 99 mg/dL   Comment 1 Notify RN    Comment 2 Document in Chart   TSH     Status: None   Collection Time: 10/11/18  6:24 AM  Result Value Ref Range   TSH 3.062 0.350 - 4.500 uIU/mL    Comment: Performed by a 3rd Generation assay with a functional sensitivity of <=0.01 uIU/mL. Performed at  [facility], [place] [address] [place], [place] [address]   Hemoglobin A1c     Status: Abnormal   Collection Time: 10/11/18  6:24 AM  Result Value Ref Range   Hgb A1c MFr Bld 8.0 (H) 4.8 - 5.6 %    Comment: (NOTE) Pre diabetes:          5.7%-6.4% Diabetes:              >6.4% Glycemic control for   <7.0% adults with diabetes    Mean Plasma Glucose 182.9 mg/dL    Comment: Performed at [facility] [address], [place], [place] [address]    Blood Alcohol level:  Lab Results  Component Value Date   ETH <10 10/09/2018   ETH 17 (H) 96/22/2979    Metabolic Disorder Labs: Lab Results  Component Value Date   HGBA1C 8.0 (H) 10/11/2018   MPG 182.9 10/11/2018   MPG 143 04/27/2008   No results found for: PROLACTIN Lab Results  Component Value Date   CHOL 99 (L) 09/21/2018   TRIG 110 09/21/2018   HDL 42 09/21/2018   CHOLHDL 2.4 09/21/2018   LDLCALC 35 09/21/2018    Physical Findings: AIMS:  , ,  ,  ,    CIWA:    COWS:     Musculoskeletal: Strength & Muscle Tone: within normal limits Gait & Station: normal-patient currently mobilizing in wheelchair secondary to  knee injury-states knee pain is subsiding and has been able to walk some distances with less pain or discomfort Patient leans: N/A  Psychiatric Specialty Exam: Physical Exam  ROS denies headache or chest pain, no cough no shortness of breath, no vomiting, no fever, no chills  Blood pressure 101/72, pulse 79, temperature 98.1 F (  36.7 C), temperature source Oral, resp. rate 16, height '5\' 3"'$  (1.6 m), weight 109.8 kg, SpO2 100 %, not currently breastfeeding.Body mass index is 42.87 kg/m.  General Appearance: Improving grooming  Eye Contact:  Fair/improved during session  Speech:  Normal Rate  Volume:  Normal  Mood:  Improving compared to admission  Affect:  Slightly irritable, tends to improve during session  Thought Process:  Linear and Descriptions of Associations: Intact   Orientation:  Other:  Fully alert and attentive  Thought Content:  No hallucinations, no delusions  Suicidal Thoughts:  No at this time denies suicidal or self-injurious ideations, denies homicidal or violent ideations  Homicidal Thoughts:  No  Memory:  Recent and remote grossly intact  Judgement:  Other:  Fair/improving  Insight:  Fair/improving  Psychomotor Activity:  Normal no psychomotor agitation or restlessness at this time  Concentration:  Concentration: Good and Attention Span: Good  Recall:  Good  Fund of Knowledge:  Good  Language:  Good  Akathisia:  Negative  Handed:  Right  AIMS (if indicated):     Assets:  Desire for Improvement Resilience  ADL's:  Intact  Cognition:  WNL  Sleep:  Number of Hours: 5.75   Assessment:  28 year old female with a history of depression, alcohol use disorder, presented for depression and suicidal thoughts of overdosing .  She is 4 months postpartum.  (Mother-in-law is currently taking care of her child).  She attributed depression in part to strained relationship with her boyfriend/child's father whom she states has been distant and uninvolved.  History of prior psychiatric admission 3 years ago for depression/suicidal ideations.  Has been diagnosed with MDD in the past.   Today patient presents with partially improved mood.  Denies suicidal ideations and presents future oriented, for example stating that she is interested in attending couples therapy with her boyfriend.  Denies medication side effects at this time/tolerating Celexa trial well thus far.   Treatment Plan Summary: Daily contact with patient to assess and evaluate symptoms and progress in treatment, Medication management, Plan Inpatient treatment and Medications as below Encourage group and milieu participation to work on coping skills and symptom reduction Increase Celexa to 20 mg daily for depression Continue Vistaril 25 mg every 8 hours PRN for anxiety Continue trazodone 50 mg  nightly PRN for insomnia as needed Continue metformin 1000 mg twice daily for diabetes mellitus Treatment team working on disposition planning options Jenne Campus, MD 10/11/2018, 2:25 PM

## 2018-10-11 NOTE — Progress Notes (Signed)
Patient ID: Grace Montgomery, female   DOB: April 19, 1990, 28 y.o.   MRN: [id number]  Nursing Progress Note [id number]  Patient presents with blunted affect but does brighten when interacting with peers. Patient observed sitting up in the dayroom and does utilize wheelchair when needed. Patient compliant with scheduled medications. Patient currently denies SI/HI/AVH.  Patient safety maintained with q15 min safety checks.   Patient remains safe on the unit at this time. Will continue to support and monitor with plan of care.   Patient's self-inventory sheet Rated Energy Level  Normal  Rated Sleep  Good  Rated Appetite  Good  Rated Anxiety (0-10)  3  Rated Hopelessness (0-10)  0  Rated Depression (0-10)  0  Daily Goal  "getting more rest, staying positive"  Any Additional Comments:

## 2018-10-11 NOTE — Progress Notes (Signed)
Grace Montgomery observe interacting with peers in the dayroom. She denies SI/HI/AVH at this time. No new c/o's. Pt states she is working on herself so that she can go back home to her kids. Pt states her boyfirned's mother is supportive. Support offered and safety maintained.

## 2018-10-12 LAB — GLUCOSE, CAPILLARY
Glucose-Capillary: 153 mg/dL — ABNORMAL HIGH (ref 70–99)
Glucose-Capillary: 108 mg/dL — ABNORMAL HIGH (ref 70–99)

## 2018-10-12 MED ORDER — CITALOPRAM HYDROBROMIDE 20 MG PO TABS: 20.0000 mg | ORAL_TABLET | Freq: Every day | ORAL | 0 refills | Status: DC

## 2018-10-12 MED ORDER — HYDROXYZINE HCL 25 MG PO TABS: 25.0000 mg | ORAL_TABLET | Freq: Three times a day (TID) | ORAL | 0 refills | Status: DC | PRN

## 2018-10-12 MED ORDER — TRAZODONE HCL 50 MG PO TABS: 50.0000 mg | ORAL_TABLET | Freq: Every evening | ORAL | 0 refills | Status: DC | PRN

## 2018-10-12 NOTE — Progress Notes (Signed)
Pt d/c from the hospital with her mother-in-law. All items returned. D/C instructions given and samples given. Pt denies si and hi.

## 2018-10-12 NOTE — [facility] Suicide Risk Assessment (Signed)
[facility] Discharge Suicide Risk Assessment   Principal Problem:  Depression Discharge Diagnoses: Active Problems:   Severe recurrent major depression without psychotic features ([place])   Total Time spent with patient: 30 minutes  Musculoskeletal: Strength & Muscle Tone: within normal limits Gait & Station: normal Patient leans: N/A  Psychiatric Specialty Exam: ROS no headache, no chest pain, no shortness of breath, no cough, no fever or chills, reports improving knee pain and is ambulating better without assistance  Blood pressure 110/61, pulse 79, temperature 98.4 F (36.9 C), temperature source Oral, resp. rate 16, height 5\' 3"  (1.6 m), weight 109.8 kg, SpO2 100 %, not currently breastfeeding.Body mass index is 42.87 kg/m.  General Appearance: Well Groomed  Eye Contact::  Good  Speech:  Normal Rate409  Volume:  Normal  Mood:  reports improving mood and describes her mood as 8/10 today , with 10 being best   Affect:  Appropriate and reactive  Thought Process:  Linear and Descriptions of Associations: Intact  Orientation:  Full (Time, Place, and Person)  Thought Content:  no hallucinations, no delusions , not internally preoccupied   Suicidal Thoughts:  No no suicidal or self injurious ideations, no homicidal or violent ideations, specifically also denies any HI or violent ideations towards BF or child   Homicidal Thoughts:  No  Memory:  recent and remote grossly intact   Judgement:  Other:  improving  Insight:  improving  Psychomotor Activity:  Normal  Concentration:  Good  Recall:  Good  Fund of Knowledge:Good  Language: Good  Akathisia:  Negative  Handed:  Right  AIMS (if indicated):     Assets:  Desire for Improvement Resilience  Sleep:  Number of Hours: 5.25  Cognition: WNL  ADL's:  Intact   Mental Status Per Nursing Assessment::   On Admission:  NA  Demographic Factors:  38, single, has 4 children, youngest is 4 months, who are currently with their grandmother,  lives with mother in [place].  Loss Factors: Relationship stressors with BF  Historical Factors: History of prior psychiatric admission for depression, history of past suicidal attempt   Risk Reduction Factors:   Responsible for children under 81 years of age, Sense of responsibility to family, Living with another person, especially a relative, Positive social support and Positive coping skills or problem solving skills  Continued Clinical Symptoms:  Alert, attentive, well related, calm, mood reported as improved and today presents euthymic, with a fuller range of affect, no thought disorder, no SI or HI, no hallucinations,no delusions, future oriented. Reports she is missing seeing her children and being with/ seeing her infant child .  Denies medication side effects- as noted , patient reports she is no longer breastfeeding Behavior on unit calm and in good control, pleasant on approach. CBG today 153.  Cognitive Features That Contribute To Risk:  No gross cognitive deficits noted upon discharge. Is alert , attentive, and oriented x 3    Suicide Risk:  Mild:  Suicidal ideation of limited frequency, intensity, duration, and specificity.  There are no identifiable plans, no associated intent, mild dysphoria and related symptoms, good self-control (both objective and subjective assessment), few other risk factors, and identifiable protective factors, including available and accessible social support.  Follow-up Information    [facility] on 10/27/2018.   Specialty: Professional Counselor Why: Your therapy appointment with Helene Kelp is scheduled for 10/27/18 at 9:00am. Please bring hospital discharge paperwork and photo ID with you. Contact information: [facility] [phone]        [facility] Follow up on 10/17/2018.   Why: Your hospital discharge appointment for medication management is scheduled  for 10/17/2018 at 10:00am. The provider will call you.  Contact information: [address] [place] [address] [phone]           Plan Of Care/Follow-up recommendations:  Activity:  as tolerated  Diet:  diabetic  Tests:  NA Other:  See below  Patient is expressing readiness for discharge and is leaving unit in good spirits Plans to return home Plans to follow up as above Plans to continue following with her PCP for management of DM/medical issues as needed.  Jenne Campus, MD 10/12/2018, 9:40 AM

## 2018-10-12 NOTE — Progress Notes (Signed)
Adult Psychoeducational Group Note  Date:  10/12/2018 Time:  10:27 AM  Group Topic/Focus:  Goals Group:   The focus of this group is to help patients establish daily goals to achieve during treatment and discuss how the patient can incorporate goal setting into their daily lives to aide in recovery. Orientation:   The focus of this group is to educate the patient on the purpose and policies of crisis stabilization and provide a format to answer questions about their admission.  The group details unit policies and expectations of patients while admitted.  Participation Level:  Active  Participation Quality:  Appropriate  Affect:  Appropriate  Cognitive:  Alert  Insight: Appropriate  Engagement in Group:  Engaged  Modes of Intervention:  Discussion and Education  Additional Comments:    Pt attended orientation/ goals group. Pt set a daily goal for today. Today's topic of discussion is crisis management. Pts discussed the crisis situation that brought them to the hospital, what triggered the crisis, what signs they noticed occurred leading up to the crisis, and what coping skills they can use in the future if a similar crisis was to occur. Pt states that her coping skills include talking to someone in her support system and smoking cannabis.     Lita Mains 10/12/2018, 10:27 AM
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
## 2018-10-12 NOTE — Progress Notes (Signed)
  [facility] Adult Case Management Discharge Plan :  Will you be returning to the same living situation after discharge:  Yes,  patient is returning home to her apartment At discharge, do you have transportation home?: Yes,  family friend (child's grandmother) is picking her up Do you have the ability to pay for your medications: Yes,  Medicaid  Release of information consent forms completed and in the chart;  Patient's signature needed at discharge.  Patient to Follow up at: Follow-up Information    [facility] on 10/27/2018.   Specialty: Professional Counselor Why: Your therapy appointment with Helene Kelp is scheduled for 10/27/18 at 9:00am. Please bring hospital discharge paperwork and photo ID with you. Contact information: [facility] [place] [phone]        [facility] Follow up on 10/17/2018.   Why: Your hospital discharge appointment for medication management is scheduled for 10/17/2018 at 10:00am. The provider will call you.  Contact information: [place] [address] [phone]           Next level of care provider has access to [place] and Suicide Prevention discussed: Yes,  with the patient's family friend (child's grandmother(     Has patient been referred to the Quitline?: N/A patient is not a smoker  Patient has been referred for addiction treatment: N/A  Marylee Floras, [place] 10/12/2018, 10:30 AM

## 2018-10-13 ENCOUNTER — Telehealth: Payer: Self-pay | Admitting: Obstetrics

## 2018-10-26 ENCOUNTER — Encounter: Payer: Self-pay | Admitting: Family Medicine

## 2018-10-26 DIAGNOSIS — F1290 Cannabis use, unspecified, uncomplicated: Secondary | ICD-10-CM

## 2018-10-26 HISTORY — DX: Cannabis use, unspecified, uncomplicated: F12.90

## 2018-11-13 ENCOUNTER — Encounter (HOSPITAL_COMMUNITY): Payer: Self-pay

## 2018-11-13 ENCOUNTER — Other Ambulatory Visit: Payer: Self-pay

## 2018-11-13 ENCOUNTER — Ambulatory Visit (HOSPITAL_COMMUNITY)
Admission: EM | Admit: 2018-11-13 | Discharge: 2018-11-13 | Disposition: A | Discharge status: Home / Self Care | Payer: Medicaid Other | Attending: Urgent Care | Admitting: Urgent Care

## 2018-11-13 DIAGNOSIS — N898 Other specified noninflammatory disorders of vagina: Secondary | ICD-10-CM | POA: Diagnosis present

## 2018-11-13 DIAGNOSIS — N760 Acute vaginitis: Secondary | ICD-10-CM | POA: Insufficient documentation

## 2018-11-13 DIAGNOSIS — E119 Type 2 diabetes mellitus without complications: Secondary | ICD-10-CM | POA: Diagnosis present

## 2018-11-13 MED ORDER — TERCONAZOLE 0.8 % VA CREA: 1.0000 | TOPICAL_CREAM | Freq: Every day | VAGINAL | 0 refills | Status: AC

## 2018-11-13 NOTE — ED Provider Notes (Signed)
MRN: [id number] DOB: 01-Oct-1990  Subjective:   [facility] is a 28 y.o. female presenting for 5-day history of recurrent vaginal itching, discharge.  Patient states that she knows she has a recurrent vaginal infection caused by yeast.  States that she gets this frequently because of her diabetes.  She has allergies to Monistat but has used terconazole for successfully with her PCP.  She is requesting this today.  She states that she has low suspicion for STI given monogamous relationship.  But she is agreeable to testing.  No current facility-administered medications for this encounter.   Current Outpatient Medications:  .  citalopram (CELEXA) 20 MG tablet, Take 1 tablet (20 mg total) by mouth daily., Disp: 30 tablet, Rfl: 0 .  empagliflozin (JARDIANCE) 25 MG TABS tablet, Take 25 mg by mouth daily., Disp: 90 tablet, Rfl: 3 .  hydrOXYzine (ATARAX/VISTARIL) 25 MG tablet, Take 1 tablet (25 mg total) by mouth 3 (three) times daily as needed for anxiety., Disp: 15 tablet, Rfl: 0 .  metFORMIN (GLUCOPHAGE) 1000 MG tablet, Take 1 tablet (1,000 mg total) by mouth 2 (two) times daily with a meal., Disp: 180 tablet, Rfl: 3 .  traZODone (DESYREL) 50 MG tablet, Take 1 tablet (50 mg total) by mouth at bedtime as needed for sleep., Disp: 10 tablet, Rfl: 0 .  levonorgestrel-ethinyl estradiol (SEASONALE) 0.15-0.03 MG tablet, Take 1 tablet by mouth daily., Disp: 1 Package, Rfl: 4    Allergies  Allergen Reactions  . Monistat [Tioconazole] Swelling  . Chocolate Itching and Other (See Comments)    Ears & throat itch  . Ibuprofen Swelling    Swelling of the feet  . Banana Itching and Other (See Comments)    Throat itching    Past Medical History:  Diagnosis Date  . Anxiety   . Asthma   . Asthma, mild persistent 06/29/2011  . Bipolar affective disorder ([place]) 09/22/2018  . Bipolar disorder ([place])   . Cannabis dependence   . Diabetes mellitus without complication ([place])   . 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 Marijuana use 10/26/2018  . MDD (major depressive disorder), recurrent severe, without psychosis ([place]) 02/16/2017  . Rh negative state in antepartum period 03/02/2018   Will need rho gam  . S/P emergency cesarean section 06/18/2018  . Seasonal allergies 06/29/2011   On zyrtec OTC    . Suicidal behavior    2013, 2018  . Supervision of high risk pregnancy, antepartum 01/12/2018    Nursing Staff Provider Office Location  [facility] Dating  [place] and 9.1 week [place] 12/08/18 Language  english Anatomy US   02/13/18 Flu Vaccine  Declined 01/12/18 Genetic Screen  NIPS: low risk  AFP: neg   TDaP vaccine   04/19/2018 Hgb A1C or  GTT Early  Third trimester  Rhogam  04/19/2018   LAB RESULTS  Feeding Plan both Blood Type --/--/A NEG (03/13 0947) A NEG  Contraception  condoms Antibody POS (03  . Tobacco use 09/18/2018  . Type 2 diabetes mellitus complicating pregnancy in third trimester, antepartum 01/05/2018   Current Diabetic Medications:  Insulin  [x]  Aspirin 81 mg daily after 12 weeks (? A2/B GDM)  For A2/B GDM or higher classes of DM [x]  Diabetes Education and Testing Supplies [x]  Nutrition Counsult [ ]  Fetal ECHO after 20 weeks - ordered 12/26 [ ]  Eye exam for retina evaluation   Baseline and surveillance labs (pulled in from [facility], refresh links as needed)  Lab Results  Component Value Date  CREATIN     Past Surgical History:  Procedure Laterality Date  . CESAREAN SECTION N/A 06/19/2018   Procedure: CESAREAN SECTION;  Surgeon: Osborne Oman, MD;  Location: [facility];  Service: Obstetrics;  Laterality: N/A;  . NO PAST SURGERIES      ROS  Objective:   Vitals: BP 114/81 (BP Location: Right Arm)   Pulse 77   Temp 97.7 F (36.5 C) (Temporal)   Resp 16   SpO2 100%    Physical Exam Constitutional:      General: She is not in acute distress.    Appearance: Normal appearance. She is well-developed. She is not ill-appearing.  HENT:     Head: Normocephalic and  atraumatic.     Nose: Nose normal.     Mouth/Throat:     Mouth: Mucous membranes are moist.     Pharynx: Oropharynx is clear.  Eyes:     General: No scleral icterus.    Extraocular Movements: Extraocular movements intact.     Pupils: Pupils are equal, round, and reactive to light.  Cardiovascular:     Rate and Rhythm: Normal rate.  Pulmonary:     Effort: Pulmonary effort is normal.  Skin:    General: Skin is warm and dry.  Neurological:     General: No focal deficit present.     Mental Status: She is alert and oriented to person, place, and time.  Psychiatric:        Mood and Affect: Mood normal.        Behavior: Behavior normal.      Assessment and Plan :   1. Vaginal itching   2. Well controlled type 2 diabetes mellitus ([place])   3. Acute vaginitis     We will use empiric treatment for yeast vaginitis given patient's history.  Labs pending.  Counseled patient on potential for adverse effects with medications prescribed/recommended today, ER and return-to-clinic precautions discussed, patient verbalized understanding.    Jaynee Eagles, PA-C 11/13/18 1249

## 2018-11-13 NOTE — ED Triage Notes (Signed)
Patient presents to Urgent Care with complaints of itching in her vagina since 5 days ago. Patient reports she has been using otc meds but she thinks it has gotten worse, pt thinks it is a yeast infection (is monogamous).

## 2018-11-13 NOTE — ED Notes (Signed)
Vaginal swab placed in lab

## 2018-11-15 ENCOUNTER — Other Ambulatory Visit (HOSPITAL_COMMUNITY): Payer: Self-pay | Admitting: Urgent Care

## 2018-11-15 LAB — CERVICOVAGINAL ANCILLARY ONLY
Bacterial vaginitis: POSITIVE — AB
Candida vaginitis: POSITIVE — AB
Chlamydia: NEGATIVE
Neisseria Gonorrhea: NEGATIVE
Trichomonas: POSITIVE — AB

## 2018-11-15 MED ORDER — METRONIDAZOLE 500 MG PO TABS: 500.0000 mg | ORAL_TABLET | Freq: Two times a day (BID) | ORAL | 0 refills | Status: DC

## 2018-11-16 ENCOUNTER — Telehealth (HOSPITAL_COMMUNITY): Payer: Self-pay | Admitting: Emergency Medicine

## 2018-11-16 NOTE — Telephone Encounter (Signed)
Patient contacted and made aware of swab results, all questions answered.   

## 2019-02-14 ENCOUNTER — Other Ambulatory Visit: Payer: Self-pay

## 2019-02-14 ENCOUNTER — Ambulatory Visit (INDEPENDENT_AMBULATORY_CARE_PROVIDER_SITE_OTHER): Payer: Medicaid Other | Admitting: Family Medicine

## 2019-02-14 ENCOUNTER — Encounter: Payer: Self-pay | Admitting: Family Medicine

## 2019-02-14 VITALS — BP 115/70 | HR 82 | Temp 98.7°F | Wt 230.0 lb

## 2019-02-14 DIAGNOSIS — E1165 Type 2 diabetes mellitus with hyperglycemia: Secondary | ICD-10-CM | POA: Diagnosis not present

## 2019-02-14 DIAGNOSIS — Z3200 Encounter for pregnancy test, result unknown: Secondary | ICD-10-CM

## 2019-02-14 DIAGNOSIS — G5602 Carpal tunnel syndrome, left upper limb: Secondary | ICD-10-CM | POA: Diagnosis not present

## 2019-02-14 DIAGNOSIS — Z3201 Encounter for pregnancy test, result positive: Secondary | ICD-10-CM | POA: Diagnosis not present

## 2019-02-14 LAB — POCT GLYCOSYLATED HEMOGLOBIN (HGB A1C): HbA1c, POC (controlled diabetic range): 5.8 % (ref 0.0–7.0)

## 2019-02-14 LAB — POCT URINE PREGNANCY: Preg Test, Ur: POSITIVE — AB

## 2019-02-14 MED ORDER — PRENATAL VITAMIN 27-0.8 MG PO TABS: 1.0000 | ORAL_TABLET | Freq: Every day | ORAL | 1 refills | Status: DC

## 2019-02-14 NOTE — Patient Instructions (Signed)
Patient left prior to getting AVS

## 2019-02-14 NOTE — Progress Notes (Signed)
Subjective:  Grace [facility] is a 28 y.o. female who presents to the [facility] today with a chief complaint of carpal tunnel pain and pregnancy test confirmation.   HPI: Pregnancy test positive Patient with multiple home pregnancy test turned positive, unsure of first day last menstrual period.  Of note does have an 39-month-old child.  Was followed for prior pregnancy with [facility] as a high risk pregnancy due to diabetes.  A1c today is 8.0, patient has been taking only Metformin although she was prescribed Jardiance she had ran out.  Carpal tunnel syndrome of left wrist Mild carpal tunnel syndrome on left side, and classic carpal tunnel distribution.  Often little worse at night, no specific remembered repetitive motions per the patient.  No new injuries.  Of note is 8 months past last pregnancy  Poorly controlled type 2 diabetes mellitus (HCC) A1c of 8.0, patient has only been taking her Metformin and not her Jardiance since her last pregnancy.  Now confirmed pregnant again.  Says she has not really been watching her diet  Objective:  Physical Exam: BP 115/70   Pulse 82   Temp 98.7 F (37.1 C) (Oral)   Wt 230 lb (104.3 kg)   LMP 12/12/2018   SpO2 98%   BMI 40.74 kg/m   Gen: NAD, pleasant  CV: RRR with no murmurs appreciated Pulm: NWOB, CTAB with no crackles, wheezes, or rhonchi MSK: no edema, cyanosis, or clubbing noted.  No swelling or skin changes over left hand from carpal tunnel complaints, no muscle wasting.  Sensation still intact.  Tinel's test negative but she is sensitive in this area Skin: warm, dry Neuro: grossly normal, moves all extremities Psych: Normal affect and thought content  Results for orders placed or performed in visit on 02/14/19 (from the past 72 hour(s))  HgB A1c     Status: None   Collection Time: 02/14/19  1:35 PM  Result Value Ref Range   Hemoglobin A1C     HbA1c POC (<> result, manual entry)     HbA1c, POC (prediabetic range)     HbA1c, POC  (controlled diabetic range) 5.8 0.0 - 7.0 %  POC Urine Pregnancy (dx code Z32.02)     Status: Abnormal   Collection Time: 02/14/19  2:55 PM  Result Value Ref Range   Preg Test, Ur Positive (A) Negative     Assessment/Plan:  Pregnancy test positive Patient with multiple home pregnancy test turned positive, unsure of first day last menstrual period.  Of note does have an 31-month-old child.  Was followed for prior pregnancy with [facility] as a high risk pregnancy due to diabetes.  A1c today is 8.0, patient has been taking only Metformin although she was prescribed Jardiance she had ran out.  Pregnancy test confirmed positive, OB culture was obtained with this urine sample but patient left prior to getting lab draw for OB panel.  She is going to be followed by for me now and says she will call and schedule appointment immediately, we will place referral for this.  OB panel still left on future orders so she can get this drawn before she goes to see OB if she would like.  She was instructed not to restart the Jardiance without discussing with OB/GYN, Metformin to be continued, we discussed that she does need to be careful with her diet as diabetes control is important for the health of the baby and her.  Carpal tunnel syndrome of left wrist Mild carpal tunnel  syndrome on left side, and classic carpal tunnel distribution.  We did try to obtain wrist splints and told her that she could use this again but she left prior to being able to obtain it here in the office.  If she contacts [place] we can provide this to her from the sports medicine clinic.  Poorly controlled type 2 diabetes mellitus (HCC) A1c of 8.0, patient has only been taking her Metformin and not her Jardiance since her last pregnancy.  Now confirmed pregnant again.  Instructed to really focus on diet control and continue taking the Metformin, instructed not to take Jardiance unless told to do so by the OB/GYN who will be following her for high  risk pregnancy   Sherene Sires, [place] - PGY3 02/16/2019 7:25 AM

## 2019-02-16 ENCOUNTER — Inpatient Hospital Stay (HOSPITAL_COMMUNITY): Payer: Medicaid Other

## 2019-02-16 ENCOUNTER — Telehealth: Payer: Self-pay | Admitting: *Deleted

## 2019-02-16 ENCOUNTER — Inpatient Hospital Stay (HOSPITAL_COMMUNITY)
Admission: AD | Admit: 2019-02-16 | Discharge: 2019-02-16 | Disposition: A | Discharge status: Home / Self Care | Payer: Medicaid Other | Attending: Obstetrics and Gynecology | Admitting: Obstetrics and Gynecology

## 2019-02-16 ENCOUNTER — Other Ambulatory Visit: Payer: Self-pay

## 2019-02-16 ENCOUNTER — Encounter (HOSPITAL_COMMUNITY): Payer: Self-pay | Admitting: Obstetrics and Gynecology

## 2019-02-16 DIAGNOSIS — Z886 Allergy status to analgesic agent status: Secondary | ICD-10-CM | POA: Diagnosis not present

## 2019-02-16 DIAGNOSIS — Z818 Family history of other mental and behavioral disorders: Secondary | ICD-10-CM | POA: Insufficient documentation

## 2019-02-16 DIAGNOSIS — O039 Complete or unspecified spontaneous abortion without complication: Secondary | ICD-10-CM | POA: Diagnosis not present

## 2019-02-16 DIAGNOSIS — Z825 Family history of asthma and other chronic lower respiratory diseases: Secondary | ICD-10-CM | POA: Insufficient documentation

## 2019-02-16 DIAGNOSIS — F17210 Nicotine dependence, cigarettes, uncomplicated: Secondary | ICD-10-CM | POA: Insufficient documentation

## 2019-02-16 DIAGNOSIS — Z91018 Allergy to other foods: Secondary | ICD-10-CM | POA: Insufficient documentation

## 2019-02-16 DIAGNOSIS — F319 Bipolar disorder, unspecified: Secondary | ICD-10-CM | POA: Insufficient documentation

## 2019-02-16 DIAGNOSIS — F1221 Cannabis dependence, in remission: Secondary | ICD-10-CM | POA: Diagnosis not present

## 2019-02-16 DIAGNOSIS — Z833 Family history of diabetes mellitus: Secondary | ICD-10-CM | POA: Insufficient documentation

## 2019-02-16 DIAGNOSIS — Z3201 Encounter for pregnancy test, result positive: Secondary | ICD-10-CM | POA: Insufficient documentation

## 2019-02-16 DIAGNOSIS — D259 Leiomyoma of uterus, unspecified: Secondary | ICD-10-CM | POA: Diagnosis not present

## 2019-02-16 DIAGNOSIS — Z813 Family history of other psychoactive substance abuse and dependence: Secondary | ICD-10-CM | POA: Insufficient documentation

## 2019-02-16 DIAGNOSIS — Z881 Allergy status to other antibiotic agents status: Secondary | ICD-10-CM | POA: Diagnosis not present

## 2019-02-16 DIAGNOSIS — Z811 Family history of alcohol abuse and dependence: Secondary | ICD-10-CM | POA: Insufficient documentation

## 2019-02-16 DIAGNOSIS — J4530 Mild persistent asthma, uncomplicated: Secondary | ICD-10-CM | POA: Diagnosis not present

## 2019-02-16 DIAGNOSIS — G5602 Carpal tunnel syndrome, left upper limb: Secondary | ICD-10-CM

## 2019-02-16 DIAGNOSIS — E119 Type 2 diabetes mellitus without complications: Secondary | ICD-10-CM | POA: Insufficient documentation

## 2019-02-16 HISTORY — DX: Carpal tunnel syndrome, left upper limb: G56.02

## 2019-02-16 LAB — URINALYSIS, ROUTINE W REFLEX MICROSCOPIC
Bilirubin Urine: NEGATIVE
Glucose, UA: NEGATIVE mg/dL
Ketones, ur: 20 mg/dL — AB
Nitrite: NEGATIVE
Protein, ur: 100 mg/dL — AB
RBC / HPF: >50 RBC/hpf — ABNORMAL HIGH (ref 0–5)
Specific Gravity, Urine: 1.024 (ref 1.005–1.030)
pH: 5 (ref 5.0–8.0)

## 2019-02-16 LAB — TYPE AND SCREEN
ABO/RH(D): A NEG
Antibody Screen: NEGATIVE

## 2019-02-16 LAB — URINE CULTURE, OB REFLEX

## 2019-02-16 LAB — CBC
HCT: 33.8 % — ABNORMAL LOW (ref 36.0–46.0)
Hemoglobin: 11.1 g/dL — ABNORMAL LOW (ref 12.0–15.0)
MCH: 28.8 pg (ref 26.0–34.0)
MCHC: 32.8 g/dL (ref 30.0–36.0)
MCV: 87.6 fL (ref 80.0–100.0)
Platelets: 337 10*3/uL (ref 150–400)
RBC: 3.86 MIL/uL — ABNORMAL LOW (ref 3.87–5.11)
RDW: 15 % (ref 11.5–15.5)
WBC: 9.1 10*3/uL (ref 4.0–10.5)
nRBC: 0 % (ref 0.0–0.2)

## 2019-02-16 LAB — CULTURE, OB URINE

## 2019-02-16 LAB — HCG, QUANTITATIVE, PREGNANCY: hCG, Beta Chain, Quant, S: 1898 m[IU]/mL — ABNORMAL HIGH (ref <5)

## 2019-02-16 MED ORDER — RHO D IMMUNE GLOBULIN 1500 UNIT/2ML IJ SOSY
300.0000 ug | PREFILLED_SYRINGE | Freq: Once | INTRAMUSCULAR | Status: AC
  Administered 2019-02-16: 18:00:00 300 ug via INTRAMUSCULAR
  Filled 2019-02-16: qty 2

## 2019-02-16 NOTE — MAU Note (Signed)
Presents with c/o abdominal cramping and VB.  Reports been bleeding since Sunday morning, reports initially spotting but VB has worsened over the week.  Reports cramping like when has menstrual cycle. States took Tylenol extra strength @ 0800.

## 2019-02-16 NOTE — Telephone Encounter (Signed)
Pt states that since her appt she is now having a "full period" instead of spotting.   She is uanble to get into [facility] for 3 weeks.   Advised to go to MAU. Marland Kitchen  Pt agreeable. Christen Bame, CMA

## 2019-02-16 NOTE — Assessment & Plan Note (Signed)
Mild carpal tunnel syndrome on left side, and classic carpal tunnel distribution.  We did try to obtain wrist splints and told her that she could use this again but she left prior to being able to obtain it here in the office.  If she contacts [place] we can provide this to her from the sports medicine clinic.

## 2019-02-16 NOTE — Assessment & Plan Note (Signed)
Patient with multiple home pregnancy test turned positive, unsure of first day last menstrual period.  Of note does have an 27-month-old child.  Was followed for prior pregnancy with [facility] as a high risk pregnancy due to diabetes.  A1c today is 8.0, patient has been taking only Metformin although she was prescribed Jardiance she had ran out.  Pregnancy test confirmed positive, OB culture was obtained with this urine sample but patient left prior to getting lab draw for OB panel.  She is going to be followed by for me now and says she will call and schedule appointment immediately, we will place referral for this.  OB panel still left on future orders so she can get this drawn before she goes to see OB if she would like.  She was instructed not to restart the Jardiance without discussing with OB/GYN, Metformin to be continued, we discussed that she does need to be careful with her diet as diabetes control is important for the health of the baby and her.

## 2019-02-16 NOTE — MAU Provider Note (Addendum)
Chief Complaint:  Vaginal Bleeding and Cramping    HPI: Grace Montgomery is a 28 y.o. [phone] with confirmed pregnancy on 02/14/19 at [redacted]w[redacted]d based on LMP who presents to the maternity assessment unit reporting increased vaginal bleeding and cramping. Bleeding began Sunday 12/6 and was noted as light spotting. It has steadily increased each day to the point the patient feels she is having periods. She notes she is soaking through 4-5 pads daily. She started passing small nickel sized clots Wednesday (12/9). She additionally endorses mild abdominal cramping pain in the lower quadrants bilaterally. Pain is refractory to Tylenol. Patient denies n/v, hematemesis, headache, dysuria, fevers/chills, constipation, melena. She reports that this feels similar to a miscarriage she has had in the past.  Past Medical History:  Diagnosis Date  . Anxiety   . Asthma   . Asthma, mild persistent 06/29/2011  . Bipolar affective disorder ([place]) 09/22/2018  . Bipolar disorder ([place])   . Cannabis dependence   . Diabetes mellitus without complication ([place])   . 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 Marijuana use 10/26/2018  . 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 Rh negative state in antepartum period 03/02/2018   Will need rho gam  . S/P emergency cesarean section 06/18/2018  . Seasonal allergies 06/29/2011   On zyrtec OTC    . Suicidal behavior    2013, 2018  . Supervision of high risk pregnancy, antepartum 01/12/2018    Nursing Staff Provider Office Location  [facility] Dating  [place] and 9.1 week [place] 12/08/18 Language  english Anatomy US   02/13/18 Flu Vaccine  Declined 01/12/18 Genetic Screen  NIPS: low risk  AFP: neg   TDaP vaccine   04/19/2018 Hgb A1C or  GTT Early  Third trimester  Rhogam  04/19/2018   LAB RESULTS  Feeding Plan both Blood Type --/--/A NEG (03/13 0947) A NEG  Contraception  condoms Antibody POS (03  . Tobacco use 09/18/2018  . Type 2  diabetes mellitus complicating pregnancy in third trimester, antepartum 01/05/2018   Current Diabetic Medications:  Insulin  [x]  Aspirin 81 mg daily after 12 weeks (? A2/B GDM)  For A2/B GDM or higher classes of DM [x]  Diabetes Education and Testing Supplies [x]  Nutrition Counsult [ ]  Fetal ECHO after 20 weeks - ordered 12/26 [ ]  Eye exam for retina evaluation   Baseline and surveillance labs (pulled in from EPIC, refresh links as needed)  Lab Results Component Value Date  CREATIN    Past obstetric history: OB History  Gravida Para Term Preterm AB Living  6 4 1 3 1 4   SAB TAB Ectopic Multiple Live Births  1 0 0 0 4    # Outcome Date GA Lbr Len/2nd Weight Sex Delivery Anes PTL Lv  6 Current           5 Term 06/19/18 105w1d  3690 g M CS-LVertical EPI  LIV  4 Preterm 05/24/09        LIV  3 Preterm 06/04/08 [redacted]w[redacted]d    Vag-Spont  N LIV  2 Preterm 11/15/06 [redacted]w[redacted]d    Vag-Spont   LIV  1 SAB             Past Surgical History: Past Surgical History:  Procedure Laterality Date  . CESAREAN SECTION N/A 06/19/2018   Procedure: CESAREAN SECTION;  Surgeon: Osborne Oman, MD;  Location: [facility];  Service: Obstetrics;  Laterality: N/A;  .  NO PAST SURGERIES      Family History: Family History  Problem Relation Age of Onset  . Sickle cell anemia Mother   . Depression Mother   . Diabetes Mother   . Asthma Mother   . Diabetes Other        grandparents and aunts/uncles  . Stroke Other        grandparent  . Alcohol abuse Father   . Depression Father   . Alcohol abuse Brother   . Drug abuse Brother     Social History: Social History   Tobacco Use  . Smoking status: Current Some Day Smoker    Packs/day: 0.25    Types: Cigarettes  . Smokeless tobacco: Never Used  Substance Use Topics  . Alcohol use: Not Currently    Comment: ocassionally  . Drug use: Not Currently    Comment: History of Marijuana - a few ago from  February 16, 2019    Allergies  Allergen Reactions  . Monistat  [Tioconazole] Swelling  . Chocolate Itching and Other (See Comments)    Ears & throat itch  . Ibuprofen Swelling    Swelling of the feet  . Banana Itching and Other (See Comments)    Throat itching    Meds:  Medications Prior to Admission  Medication Sig Dispense Refill Last Dose  . citalopram (CELEXA) 20 MG tablet Take 1 tablet (20 mg total) by mouth daily. 30 tablet 0 02/16/2019 at 0830  . metFORMIN (GLUCOPHAGE) 1000 MG tablet Take 1 tablet (1,000 mg total) by mouth 2 (two) times daily with a meal. 180 tablet 3 02/16/2019 at 0830  . hydrOXYzine (ATARAX/VISTARIL) 25 MG tablet Take 1 tablet (25 mg total) by mouth 3 (three) times daily as needed for anxiety. 15 tablet 0 More than a month at Unknown time  . levonorgestrel-ethinyl estradiol (SEASONALE) 0.15-0.03 MG tablet Take 1 tablet by mouth daily. 1 Package 4   . Prenatal Vit-Fe Fumarate-FA (PRENATAL VITAMIN) 27-0.8 MG TABS Take 1 tablet by mouth daily. 180 tablet 1   . traZODone (DESYREL) 50 MG tablet Take 1 tablet (50 mg total) by mouth at bedtime as needed for sleep. 10 tablet 0 More than a month at Unknown time    ROS:  MSK: patient endorses hx of chronic right knee pain. She states it has been evaluated by several other providers, imaging has always returned normal without diagnosis, patient also endorses left sided carpal tunnel syndrome associated with her previous pregnancy in April, she currently treats it with bracing, plans on having carpal tunnel release surgery soon.  All other ROS is negative.  I have reviewed patient's Past Medical Hx, Surgical Hx, Family Hx, Social Hx, medications and allergies.   Physical Exam   Patient Vitals for the past 24 hrs:  BP Temp Temp src Pulse Resp SpO2 Height Weight  02/16/19 1826 (!) 143/85 98.6 F (37 C) Oral 81 19 99 % -- --  02/16/19 1523 111/72 98.6 F (37 C) Oral 79 20 100 % -- --  02/16/19 1521 -- -- -- -- -- -- 5' 3.5" (1.613 m) 103.3 kg   Constitutional: Well-developed,  well-nourished female in no acute distress.  Cardiovascular: normal rate Respiratory: normal effort GI: Abd soft, non-tender, gravid appropriate for gestational age.  MS: Extremities nontender, no edema, normal ROM Neurologic: Alert and oriented x 4.  GU: Neg CVAT. PELVIC EXAM: blood pooled in vaginal vault, cervical os open with blood passing through, external genitalia normal Bimanual exam: No CMT, uterus  nontender, nonenlarged, adnexa without tenderness, enlargement, or mass    Labs: Results for orders placed or performed during the hospital encounter of 02/16/19 (from the past 24 hour(s))  Urinalysis, Routine w reflex microscopic     Status: Abnormal   Collection Time: 02/16/19  3:24 PM  Result Value Ref Range   Color, Urine YELLOW YELLOW   APPearance HAZY (A) CLEAR   Specific Gravity, Urine 1.024 1.005 - 1.030   pH 5.0 5.0 - 8.0   Glucose, UA NEGATIVE NEGATIVE mg/dL   Hgb urine dipstick LARGE (A) NEGATIVE   Bilirubin Urine NEGATIVE NEGATIVE   Ketones, ur 20 (A) NEGATIVE mg/dL   Protein, ur 100 (A) NEGATIVE mg/dL   Nitrite NEGATIVE NEGATIVE   Leukocytes,Ua TRACE (A) NEGATIVE   RBC / HPF >50 (H) 0 - 5 RBC/hpf   WBC, UA 6-10 0 - 5 WBC/hpf   Bacteria, UA RARE (A) NONE SEEN   Squamous Epithelial / LPF 6-10 0 - 5   Mucus PRESENT   Type and screen [place]     Status: None   Collection Time: 02/16/19  5:15 PM  Result Value Ref Range   ABO/RH(D) A NEG    Antibody Screen NEG    Sample Expiration      02/19/2019,2359 Performed at [facility], [address] [place], [place] [address]   CBC     Status: Abnormal   Collection Time: 02/16/19  5:15 PM  Result Value Ref Range   WBC 9.1 4.0 - 10.5 K/uL   RBC 3.86 (L) 3.87 - 5.11 MIL/uL   Hemoglobin 11.1 (L) 12.0 - 15.0 g/dL   HCT 33.8 (L) 36.0 - 46.0 %   MCV 87.6 80.0 - 100.0 fL   MCH 28.8 26.0 - 34.0 pg   MCHC 32.8 30.0 - 36.0 g/dL   RDW 15.0 11.5 - 15.5 %   Platelets 337 150 - 400 K/uL   nRBC 0.0  0.0 - 0.2 %  hCG, quantitative, pregnancy     Status: Abnormal   Collection Time: 02/16/19  5:15 PM  Result Value Ref Range   hCG, Beta Chain, Quant, S 1,898 (H) <5 mIU/mL  Rh IG workup (includes ABO/Rh)     Status: None (Preliminary result)   Collection Time: 02/16/19  5:15 PM  Result Value Ref Range   Gestational Age(Wks) 9.3    ABO/RH(D) A NEG    Antibody Screen NEG    Unit Number [id number]    Blood Component Type RHIG    Unit division 00    Status of Unit ISSUED    Transfusion Status      OK TO TRANSFUSE Performed at [facility], [address], [place], [place] [address]    --/--/A NEG, A NEG (12/11 1715)  Imaging:  US OB Comp Less 14 Wks  Result Date: 02/16/2019 CLINICAL DATA:  Miscarriage, cramping and bleeding for 6 days, LMP 12/12/2018 EXAM: OBSTETRIC <14 WK ULTRASOUND TECHNIQUE: Transabdominal ultrasound was performed for evaluation of the gestation as well as the maternal uterus and adnexal regions. Patient refused transvaginal imaging. COMPARISON:  None FINDINGS: Intrauterine gestational sac: Not identified Yolk sac:  N/A Embryo:  N/A Cardiac Activity: N/A Heart Rate: N/A bpm MSD:    mm    w     d CRL:     mm    w  d                  [place]  EDC: Subchorionic hemorrhage:  N/A Maternal uterus/adnexae: Uterus anteverted with small degenerated leiomyoma at posterior mid uterus 1.4 x 1.6 x 1.1 cm. Heterogeneous myometrium. Endometrial complex appears normal in thickness with small amount of endometrial fluid. No gestational sac or endometrial mass identified. RIGHT ovary measures 2.7 x 2.9 x 2.9 cm and contains a corpus luteal cyst. LEFT ovary normal size and morphology 2.6 x 2.8 x 2.3 cm. No free pelvic fluid or adnexal masses. IMPRESSION: Small amount of nonspecific endometrial fluid. No intrauterine gestation identified. Findings are compatible with pregnancy of unknown location. Differential diagnosis includes early intrauterine pregnancy too early to visualize,  spontaneous abortion, and ectopic pregnancy. Serial quantitative beta hCG and or followup ultrasound recommended to definitively exclude ectopic pregnancy. Small degenerated leiomyoma at posterior mid uterus 1.6 cm greatest diameter. Electronically Signed   By: Lavonia Dana M.D.   On: 02/16/2019 18:00    MAU Course/MDM: -CBC to assess bleeding -Quantitative HCG to establish baseline   -TVUS to evaluate for miscarriage -Type and Screen with Rh IG work up to confirm A negative blood type -Rhogam 300 mg following Rh IG workup   Orders Placed This Encounter  Procedures  . US OB Comp Less 14 Wks  . Urinalysis, Routine w reflex microscopic  . CBC  . hCG, quantitative, pregnancy  . Type and screen [place]  . Rh IG workup (includes ABO/Rh)  . Discharge patient Discharge disposition: 01-Home or Self Care; Discharge patient date: 02/16/2019    Meds ordered this encounter  Medications  . rho (d) immune globulin (RHIG/RHOPHYLAC) injection 300 mcg     Assessment: Patient is a 28 y.o. [id number] with prior history of SAB and confirmed pregnancy on 12/9 at [redacted]w[redacted]d based on LMP presenting with 1 week of vaginal bleeding, lower abdominal cramping, and an open cervical os suggestive of SAB.  1. Miscarriage 1 week of vaginal bleeding associated with mild lower abdominal cramping, prior miscarriage, open cervical os passing blood with blood pooling in the vaginal vault, patient's blood type is A negative -CBC: Hgb 11.1 -Quantitative HCG: 1898, patient will need follow up in 1 week, prefers to follow up with [facility] -TVUS: showed empty uterine cavity, no POCs, consistent with complete abortion -Rh negative status: s/p Rhogam 300 mg -DC to home with return precautions  Carolyne Littles 02/16/2019 6:50 PM   GME ATTESTATION:  I saw and evaluated the patient. I agree with the findings and the plan of care as documented in the student's note.  Merilyn Baba, DO OB Fellow, Faculty  Practice 02/16/2019 6:53 PM

## 2019-02-16 NOTE — Discharge Instructions (Signed)
Miscarriage A miscarriage is the loss of an unborn baby (fetus) before the 20th week of pregnancy. Follow these instructions at home: Medicines   Take over-the-counter and prescription medicines only as told by your doctor.  If you were prescribed antibiotic medicine, take it as told by your doctor. Do not stop taking the antibiotic even if you start to feel better.  Do not take NSAIDs unless your doctor says that this is safe for you. NSAIDs include aspirin and ibuprofen. These medicines can cause bleeding. Activity  Rest as directed. Ask your doctor what activities are safe for you.  Have someone help you at home during this time. General instructions  Write down how many pads you use each day and how soaked they are.  Watch the amount of tissue or clumps of blood (blood clots) that you pass from your vagina. Save any large amounts of tissue for your doctor.  Do not use tampons, douche, or have sex until your doctor approves.  To help you and your partner with the process of grieving, talk with your doctor or seek counseling.  When you are ready, meet with your doctor to talk about steps you should take for your health. Also, talk with your doctor about steps to take to have a healthy pregnancy in the future.  Keep all follow-up visits as told by your doctor. This is important. Contact a doctor if:  You have a fever or chills.  You have vaginal discharge that smells bad.  You have more bleeding. Get help right away if:  You have very bad cramps or pain in your back or belly.  You pass clumps of blood that are walnut-sized or larger from your vagina.  You pass tissue that is walnut-sized or larger from your vagina.  You soak more than 1 regular pad in an hour.  You get light-headed or weak.  You faint (pass out).  You have feelings of sadness that do not go away, or you have thoughts of hurting yourself. Summary  A miscarriage is the loss of an unborn baby before  the 20th week of pregnancy.  Follow your doctor's instructions for home care. Keep all follow-up appointments.  To help you and your partner with the process of grieving, talk with your doctor or seek counseling. This information is not intended to replace advice given to you by your health care provider. Make sure you discuss any questions you have with your health care provider. Document Released: 05/17/2011 Document Revised: 06/16/2018 Document Reviewed: 03/30/2016 Elsevier Patient Education  2020 [employer].

## 2019-02-16 NOTE — Assessment & Plan Note (Signed)
A1c of 8.0, patient has only been taking her Metformin and not her Jardiance since her last pregnancy.  Now confirmed pregnant again.  Instructed to really focus on diet control and continue taking the Metformin, instructed not to take Jardiance unless told to do so by the OB/GYN who will be following her for high risk pregnancy

## 2019-02-17 LAB — RH IG WORKUP (INCLUDES ABO/RH)
ABO/RH(D): A NEG
Antibody Screen: NEGATIVE
Gestational Age(Wks): 9.3
Unit division: 0

## 2019-02-19 ENCOUNTER — Telehealth: Payer: Self-pay | Admitting: *Deleted

## 2019-02-19 DIAGNOSIS — O039 Complete or unspecified spontaneous abortion without complication: Secondary | ICD-10-CM

## 2019-02-19 NOTE — Telephone Encounter (Signed)
-----   Message from Sherene Sires, DO sent at 02/16/2019  7:28 AM EST ----- [place], please call patient and remind them to schedule a OB follow-up with [facility].  I did place referral and she was supposed to call and schedule her appointment after seeing me but I am still not seeing one of the books.

## 2019-02-21 NOTE — Telephone Encounter (Signed)
Patient was recently evaluated in the MAU and found to have a spontaneous abortion, they did suggest follow-up in 1 week with a quantitative hCG.  Follow-up labs placed as patient was offered a in person visit and preferred a lab check.  If she has symptoms or complaints when she comes in for a lab visit she should be routed to a preceptor or physician in clinic.  -Dr. Criss Rosales

## 2019-02-21 NOTE — Telephone Encounter (Signed)
Contacted pt and offered her and in person or just a lab to follow up her hospital visit on the 11th and she wanted to do a lab visit.  It is scheduled for 02/28/19 @ 2:30pm. Will route to Dr. Criss Rosales to order this lab for pt. Grace Montgomery, CMA

## 2019-02-28 ENCOUNTER — Other Ambulatory Visit: Payer: Medicaid Other

## 2019-04-21 ENCOUNTER — Encounter (HOSPITAL_COMMUNITY): Payer: Self-pay

## 2019-04-21 ENCOUNTER — Other Ambulatory Visit: Payer: Self-pay

## 2019-04-21 ENCOUNTER — Emergency Department (HOSPITAL_COMMUNITY)
Admission: EM | Admit: 2019-04-21 | Discharge: 2019-04-21 | Disposition: A | Discharge status: Home / Self Care | Payer: Medicaid Other | Attending: Emergency Medicine | Admitting: Emergency Medicine

## 2019-04-21 DIAGNOSIS — Z5321 Procedure and treatment not carried out due to patient leaving prior to being seen by health care provider: Secondary | ICD-10-CM | POA: Insufficient documentation

## 2019-04-21 DIAGNOSIS — R112 Nausea with vomiting, unspecified: Secondary | ICD-10-CM | POA: Diagnosis not present

## 2019-04-21 LAB — LIPASE, BLOOD: Lipase: 17 U/L (ref 11–51)

## 2019-04-21 LAB — CBC
HCT: 38.8 % (ref 36.0–46.0)
Hemoglobin: 12.5 g/dL (ref 12.0–15.0)
MCH: 28.3 pg (ref 26.0–34.0)
MCHC: 32.2 g/dL (ref 30.0–36.0)
MCV: 88 fL (ref 80.0–100.0)
Platelets: 436 10*3/uL — ABNORMAL HIGH (ref 150–400)
RBC: 4.41 MIL/uL (ref 3.87–5.11)
RDW: 15.4 % (ref 11.5–15.5)
WBC: 15.4 10*3/uL — ABNORMAL HIGH (ref 4.0–10.5)
nRBC: 0 % (ref 0.0–0.2)

## 2019-04-21 LAB — COMPREHENSIVE METABOLIC PANEL
ALT: 21 U/L (ref 0–44)
AST: 29 U/L (ref 15–41)
Albumin: 4.6 g/dL (ref 3.5–5.0)
Alkaline Phosphatase: 73 U/L (ref 38–126)
Anion gap: 20 — ABNORMAL HIGH (ref 5–15)
BUN: 16 mg/dL (ref 6–20)
CO2: 17 mmol/L — ABNORMAL LOW (ref 22–32)
Calcium: 9.7 mg/dL (ref 8.9–10.3)
Chloride: 105 mmol/L (ref 98–111)
Creatinine, Ser: 1.02 mg/dL — ABNORMAL HIGH (ref 0.44–1.00)
GFR calc Af Amer: >60 mL/min (ref >60)
GFR calc non Af Amer: >60 mL/min (ref >60)
Glucose, Bld: 76 mg/dL (ref 70–99)
Potassium: 4.1 mmol/L (ref 3.5–5.1)
Sodium: 142 mmol/L (ref 135–145)
Total Bilirubin: 0.9 mg/dL (ref 0.3–1.2)
Total Protein: 8.6 g/dL — ABNORMAL HIGH (ref 6.5–8.1)

## 2019-04-21 LAB — I-STAT BETA HCG BLOOD, ED (MC, WL, AP ONLY): I-stat hCG, quantitative: <5 m[IU]/mL (ref <5)

## 2019-04-21 MED ORDER — SODIUM CHLORIDE 0.9% FLUSH: 3.0000 mL | Freq: Once | INTRAVENOUS | Status: DC

## 2019-04-21 NOTE — ED Triage Notes (Signed)
Pt presents with c/o nausea and vomiting that started this morning. Pt denies any abdominal pain. Pt reports she noticed some coffee ground like material in her vomit.

## 2019-05-13 ENCOUNTER — Other Ambulatory Visit: Payer: Self-pay

## 2019-05-13 ENCOUNTER — Encounter (HOSPITAL_COMMUNITY): Payer: Self-pay | Admitting: Emergency Medicine

## 2019-05-13 ENCOUNTER — Telehealth: Payer: Self-pay | Admitting: Family Medicine

## 2019-05-13 ENCOUNTER — Ambulatory Visit (HOSPITAL_COMMUNITY)
Admission: EM | Admit: 2019-05-13 | Discharge: 2019-05-13 | Disposition: A | Discharge status: Home / Self Care | Payer: Medicaid Other | Attending: Emergency Medicine | Admitting: Emergency Medicine

## 2019-05-13 DIAGNOSIS — N760 Acute vaginitis: Secondary | ICD-10-CM | POA: Diagnosis present

## 2019-05-13 DIAGNOSIS — Z3202 Encounter for pregnancy test, result negative: Secondary | ICD-10-CM

## 2019-05-13 DIAGNOSIS — Z202 Contact with and (suspected) exposure to infections with a predominantly sexual mode of transmission: Secondary | ICD-10-CM | POA: Diagnosis present

## 2019-05-13 DIAGNOSIS — B9689 Other specified bacterial agents as the cause of diseases classified elsewhere: Secondary | ICD-10-CM | POA: Diagnosis not present

## 2019-05-13 LAB — POCT URINALYSIS DIP (DEVICE)
Bilirubin Urine: NEGATIVE
Glucose, UA: NEGATIVE mg/dL
Ketones, ur: NEGATIVE mg/dL
Nitrite: NEGATIVE
Protein, ur: NEGATIVE mg/dL
Specific Gravity, Urine: 1.01 (ref 1.005–1.030)
Urobilinogen, UA: 0.2 mg/dL (ref 0.0–1.0)
pH: 6 (ref 5.0–8.0)

## 2019-05-13 LAB — POC URINE PREG, ED
Preg Test, Ur: NEGATIVE
Preg Test, Ur: NEGATIVE

## 2019-05-13 LAB — POCT PREGNANCY, URINE: Preg Test, Ur: NEGATIVE

## 2019-05-13 MED ORDER — METRONIDAZOLE 500 MG PO TABS: 500.0000 mg | ORAL_TABLET | Freq: Two times a day (BID) | ORAL | 0 refills | Status: AC

## 2019-05-13 MED ORDER — FLUCONAZOLE 150 MG PO TABS: 150.0000 mg | ORAL_TABLET | Freq: Once | ORAL | 0 refills | Status: AC

## 2019-05-13 NOTE — Discharge Instructions (Signed)
Begin metronidazole twice daily for the next week to treat for bacterial vaginosis and trichomonas. Take with food. Avoid alcohol until 24 hours after last tablet.  We are testing you for Gonorrhea, Chlamydia, Trichomonas, Yeast and Bacterial Vaginosis. We will call you if anything is positive and let you know if you require any further treatment. Please inform partners of any positive results.   Please return if symptoms not improving with treatment, development of fever, nausea, vomiting, abdominal pain.

## 2019-05-13 NOTE — ED Provider Notes (Addendum)
Grace Montgomery    CSN: [id number] Arrival date & time: 05/13/19  1318      History   Chief Complaint Chief Complaint  Patient presents with  . Vaginal Itching    HPI LEKESHA Montgomery is a 29 y.o. female history of asthma, DM type II, presenting today for evaluation of vaginal itching and discharge.  Patient states that beginning last night she began to develop a burning sensation that she feels all the time as well as with urination.  Denies associated urinary frequency/urgency.  She noticed a discharge that initially was white, but is turned yellow.  She just recently ended her menstrual cycle.  She is on oral contraceptives.  She is concerned about STDs as a cause of this, reports partner testing positive for trichomonas recently.  She denies associated fever nausea or vomiting.  Denies abdominal pain, back pain.  HPI  Past Medical History:  Diagnosis Date  . Anxiety   . Asthma   . Asthma, mild persistent 06/29/2011  . Bipolar affective disorder ([place]) 09/22/2018  . Bipolar disorder ([place])   . Cannabis dependence   . Diabetes mellitus without complication ([place])   . 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 Marijuana use 10/26/2018  . 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 Rh negative state in antepartum period 03/02/2018   Will need rho gam  . S/P emergency cesarean section 06/18/2018  . Seasonal allergies 06/29/2011   On zyrtec OTC    . Suicidal behavior    2013, 2018  . Supervision of high risk pregnancy, antepartum 01/12/2018    Nursing Staff Provider Office Location  [facility] Dating  [place] and 9.1 week [place] 12/08/18 Language  english Anatomy US   02/13/18 Flu Vaccine  Declined 01/12/18 Genetic Screen  NIPS: low risk  AFP: neg   TDaP vaccine   04/19/2018 Hgb A1C or  GTT Early  Third trimester  Rhogam  04/19/2018   LAB RESULTS  Feeding Plan both Blood Type --/--/A NEG (03/13 0947) A NEG  Contraception   condoms Antibody POS (03  . Tobacco use 09/18/2018  . Type 2 diabetes mellitus complicating pregnancy in third trimester, antepartum 01/05/2018   Current Diabetic Medications:  Insulin  [x]  Aspirin 81 mg daily after 12 weeks (? A2/B GDM)  For A2/B GDM or higher classes of DM [x]  Diabetes Education and Testing Supplies [x]  Nutrition Counsult [ ]  Fetal ECHO after 20 weeks - ordered 12/26 [ ]  Eye exam for retina evaluation   Baseline and surveillance labs (pulled in from EPIC, refresh links as needed)  Lab Results Component Value Date  CREATIN    Patient Active Problem List   Diagnosis Date Noted  . Pregnancy test positive 02/16/2019  . Carpal tunnel syndrome of left wrist 02/16/2019  . Severe recurrent major depression without psychotic features ([place]) 10/10/2018  . Morbid obesity ([place]) 09/22/2018  . Bipolar affective disorder ([place]) 09/22/2018  . Tobacco use 09/18/2018  . Poorly controlled type 2 diabetes mellitus ([place]) 11/06/2016  . Contraception management 08/22/2012  . Maternal morbid obesity, 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[place]) 06/29/2011  . Exercise-induced asthma Jun 09, 1989    Past Surgical History:  Procedure Laterality Date  . CESAREAN SECTION N/A 06/19/2018   Procedure: CESAREAN SECTION;  Surgeon: Osborne Oman, MD;  Location: [facility]S;  Service: Obstetrics;  Laterality: N/A;  . NO PAST SURGERIES      OB History    Gravida  6  Para  4   Term  1   Preterm  3   AB  1   Living  4     SAB  1   TAB  0   Ectopic  0   Multiple  0   Live Births  4            Home Medications    Prior to Admission medications   Medication Sig Start Date End Date Taking? Authorizing Provider  citalopram (CELEXA) 20 MG tablet Take 1 tablet (20 mg total) by mouth daily. 10/13/18   Connye Burkitt, NP  fluconazole (DIFLUCAN) 150 MG tablet Take 1 tablet (150 mg total) by mouth once for 1 dose. 05/13/19 05/13/19  Alvetta Hidrogo C, PA-C  hydrOXYzine (ATARAX/VISTARIL) 25 MG tablet Take 1 tablet (25 mg  total) by mouth 3 (three) times daily as needed for anxiety. Patient not taking: Reported on 05/13/2019 10/12/18   Connye Burkitt, NP  levonorgestrel-ethinyl estradiol (SEASONALE) 0.15-0.03 MG tablet Take 1 tablet by mouth daily. 10/11/18   McDiarmid, Blane Ohara, MD  metFORMIN (GLUCOPHAGE) 1000 MG tablet Take 1 tablet (1,000 mg total) by mouth 2 (two) times daily with a meal. 09/21/18   McDiarmid, Blane Ohara, MD  metroNIDAZOLE (FLAGYL) 500 MG tablet Take 1 tablet (500 mg total) by mouth 2 (two) times daily for 7 days. 05/13/19 05/20/19  Carles Florea C, PA-C  Prenatal Vit-Fe Fumarate-FA (PRENATAL VITAMIN) 27-0.8 MG TABS Take 1 tablet by mouth daily. 02/14/19   Sherene Sires, DO  traZODone (DESYREL) 50 MG tablet Take 1 tablet (50 mg total) by mouth at bedtime as needed for sleep. 10/12/18   Connye Burkitt, NP    Family History Family History  Problem Relation Age of Onset  . Sickle cell anemia Mother   . Depression Mother   . Diabetes Mother   . Asthma Mother   . Diabetes Other        grandparents and aunts/uncles  . Stroke Other        grandparent  . Alcohol abuse Father   . Depression Father   . Alcohol abuse Brother   . Drug abuse Brother     Social History Social History   Tobacco Use  . Smoking status: Current Some Day Smoker    Packs/day: 0.25    Types: Cigarettes  . Smokeless tobacco: Never Used  Substance Use Topics  . Alcohol use: Not Currently    Comment: ocassionally  . Drug use: Not Currently    Comment: History of Marijuana - a few ago from  February 16, 2019     Allergies   Monistat [tioconazole], Chocolate, Ibuprofen, and Banana   Review of Systems Review of Systems  Constitutional: Negative for fever.  Respiratory: Negative for shortness of breath.   Cardiovascular: Negative for chest pain.  Gastrointestinal: Negative for abdominal pain, diarrhea, nausea and vomiting.  Genitourinary: Positive for dysuria and vaginal discharge. Negative for flank pain, genital sores,  hematuria, menstrual problem, vaginal bleeding and vaginal pain.  Musculoskeletal: Negative for back pain.  Skin: Negative for rash.  Neurological: Negative for dizziness, light-headedness and headaches.     Physical Exam Triage Vital Signs ED Triage Vitals [05/13/19 1418]  Enc Vitals Group     BP 132/90     Pulse Rate 82     Resp 18     Temp 98 F (36.7 C)     Temp Source Oral     SpO2 98 %  Weight      Height      Head Circumference      Peak Flow      Pain Score 2     Pain Loc      Pain Edu?      Excl. in [place]?    No data found.  Updated Vital Signs BP 132/90 (BP Location: Right Arm)   Pulse 82   Temp 98 F (36.7 C) (Oral)   Resp 18   LMP 04/13/2019 (Approximate)   SpO2 98%   Visual Acuity Right Eye Distance:   Left Eye Distance:   Bilateral Distance:    Right Eye Near:   Left Eye Near:    Bilateral Near:     Physical Exam Vitals and nursing note reviewed.  Constitutional:      Appearance: She is well-developed.     Comments: No acute distress  HENT:     Head: Normocephalic and atraumatic.     Nose: Nose normal.  Eyes:     Conjunctiva/sclera: Conjunctivae normal.  Cardiovascular:     Rate and Rhythm: Normal rate.  Pulmonary:     Effort: Pulmonary effort is normal. No respiratory distress.  Abdominal:     General: There is no distension.     Comments: Soft, nondistended, nontender to light and deep palpation throughout abdomen  Genitourinary:    Comments: Deferred Musculoskeletal:        General: Normal range of motion.     Cervical back: Neck supple.  Skin:    General: Skin is warm and dry.  Neurological:     Mental Status: She is alert and oriented to person, place, and time.      UC Treatments / Results  Labs (all labs ordered are listed, but only abnormal results are displayed) Labs Reviewed  POCT URINALYSIS DIP (DEVICE) - Abnormal; Notable for the following components:      Result Value   Hgb urine dipstick TRACE (*)     Leukocytes,Ua LARGE (*)    All other components within normal limits  URINE CULTURE  POC URINE PREG, ED  POCT PREGNANCY, URINE  POC URINE PREG, ED  CERVICOVAGINAL ANCILLARY ONLY    EKG   Radiology No results found.  Procedures Procedures (including critical care time)  Medications Ordered in UC Medications - No data to display  Initial Impression / Assessment and Plan / UC Course  I have reviewed the triage vital signs and the nursing notes.  Pertinent labs & imaging results that were available during my care of the patient were reviewed by me and considered in my medical decision making (see chart for details).    Pregnancy test negative. Patient was symptoms suggestive of likely vaginitis, given exposure to trichomonas we will go ahead and start treatment for this with metronidazole twice daily x1 week, also providing Diflucan to treat for yeast as patient gets these recurrently as well.  Has tolerated Diflucan previously without allergic reaction.  Vaginal swab pending, urine culture pending, will call with results and provide further treatment as needed.  Large leuks on UA likely from discharge rather than UTI.  Continue to monitor symptoms,Discussed strict return precautions. Patient verbalized understanding and is agreeable with plan.  Final Clinical Impressions(s) / UC Diagnoses   Final diagnoses:  Vaginitis and vulvovaginitis  STD exposure     Discharge Instructions     Begin metronidazole twice daily for the next week to treat for bacterial vaginosis and trichomonas. Take with food. Avoid alcohol  until 24 hours after last tablet.  We are testing you for Gonorrhea, Chlamydia, Trichomonas, Yeast and Bacterial Vaginosis. We will call you if anything is positive and let you know if you require any further treatment. Please inform partners of any positive results.   Please return if symptoms not improving with treatment, development of fever, nausea, vomiting, abdominal  pain.     ED Prescriptions    Medication Sig Dispense Auth. Provider   metroNIDAZOLE (FLAGYL) 500 MG tablet Take 1 tablet (500 mg total) by mouth 2 (two) times daily for 7 days. 14 tablet Issabella Rix C, PA-C   fluconazole (DIFLUCAN) 150 MG tablet Take 1 tablet (150 mg total) by mouth once for 1 dose. 2 tablet Raphael Espe, [place] C, PA-C     PDMP not reviewed this encounter.   Janith Lima, PA-C 05/13/19 1516    Janith Lima, PA-C 05/13/19 1517

## 2019-05-13 NOTE — Telephone Encounter (Signed)
**  After Hours/ Emergency Line Call**  Received a call to report that Darlina Rumpf is concerned she has trichomonas.  Endorsing vaginal itching and states, "I already know what it is and that it's trich, but I didn't know if you could treat me or if I need to be tested."  Recommended that patient either call in the AM for appointment at [facility] or if she would like she can go to Urgent Care.  States that she has already made plans to go to urgent care so she will be going there.  Red flags discussed.  Will forward to PCP.  [place] Constable, DO PGY-2, [facility] 05/13/2019 12:27 PM

## 2019-05-13 NOTE — ED Triage Notes (Signed)
Pt here for vaginal itching; pt sts she thinks it is trichomonas

## 2019-05-14 LAB — URINE CULTURE: Culture: <10000 — AB

## 2019-05-15 LAB — CERVICOVAGINAL ANCILLARY ONLY
Bacterial vaginitis: POSITIVE — AB
Candida vaginitis: NEGATIVE
Chlamydia: NEGATIVE
Neisseria Gonorrhea: NEGATIVE
Trichomonas: POSITIVE — AB

## 2019-06-09 ENCOUNTER — Encounter (HOSPITAL_COMMUNITY): Payer: Self-pay | Admitting: Emergency Medicine

## 2019-06-09 ENCOUNTER — Telehealth: Payer: Self-pay | Admitting: Family Medicine

## 2019-06-09 ENCOUNTER — Emergency Department (HOSPITAL_COMMUNITY): Payer: Medicaid Other

## 2019-06-09 ENCOUNTER — Other Ambulatory Visit: Payer: Self-pay

## 2019-06-09 ENCOUNTER — Emergency Department (HOSPITAL_COMMUNITY)
Admission: EM | Admit: 2019-06-09 | Discharge: 2019-06-09 | Disposition: A | Discharge status: Home / Self Care | Payer: Medicaid Other | Attending: Emergency Medicine | Admitting: Emergency Medicine

## 2019-06-09 ENCOUNTER — Encounter: Payer: Self-pay | Admitting: Family Medicine

## 2019-06-09 DIAGNOSIS — Y939 Activity, unspecified: Secondary | ICD-10-CM | POA: Insufficient documentation

## 2019-06-09 DIAGNOSIS — G44319 Acute post-traumatic headache, not intractable: Secondary | ICD-10-CM | POA: Diagnosis not present

## 2019-06-09 DIAGNOSIS — S060X0A Concussion without loss of consciousness, initial encounter: Secondary | ICD-10-CM

## 2019-06-09 DIAGNOSIS — S060XAA Concussion with loss of consciousness status unknown, initial encounter: Secondary | ICD-10-CM

## 2019-06-09 DIAGNOSIS — S0990XA Unspecified injury of head, initial encounter: Secondary | ICD-10-CM | POA: Diagnosis present

## 2019-06-09 DIAGNOSIS — J45909 Unspecified asthma, uncomplicated: Secondary | ICD-10-CM | POA: Diagnosis not present

## 2019-06-09 DIAGNOSIS — Y929 Unspecified place or not applicable: Secondary | ICD-10-CM | POA: Diagnosis not present

## 2019-06-09 DIAGNOSIS — S060X9A Concussion with loss of consciousness of unspecified duration, initial encounter: Secondary | ICD-10-CM

## 2019-06-09 DIAGNOSIS — F17210 Nicotine dependence, cigarettes, uncomplicated: Secondary | ICD-10-CM | POA: Diagnosis not present

## 2019-06-09 DIAGNOSIS — Z7984 Long term (current) use of oral hypoglycemic drugs: Secondary | ICD-10-CM | POA: Diagnosis not present

## 2019-06-09 DIAGNOSIS — Y999 Unspecified external cause status: Secondary | ICD-10-CM | POA: Insufficient documentation

## 2019-06-09 DIAGNOSIS — F319 Bipolar disorder, unspecified: Secondary | ICD-10-CM | POA: Insufficient documentation

## 2019-06-09 DIAGNOSIS — E119 Type 2 diabetes mellitus without complications: Secondary | ICD-10-CM | POA: Insufficient documentation

## 2019-06-09 HISTORY — DX: Concussion with loss of consciousness of unspecified duration, initial encounter: S06.0X9A

## 2019-06-09 HISTORY — DX: Acute post-traumatic headache, not intractable: G44.319

## 2019-06-09 HISTORY — DX: Concussion with loss of consciousness status unknown, initial encounter: S06.0XAA

## 2019-06-09 MED ORDER — ONDANSETRON 4 MG PO TBDP: 4.0000 mg | ORAL_TABLET | Freq: Three times a day (TID) | ORAL | 0 refills | Status: DC | PRN

## 2019-06-09 MED ORDER — LORAZEPAM 1 MG PO TABS
1.0000 mg | ORAL_TABLET | Freq: Once | ORAL | Status: AC
  Administered 2019-06-09: 1 mg via ORAL
  Filled 2019-06-09: qty 1
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
## 2019-06-09 NOTE — ED Notes (Signed)
Patient transported to CT 
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
## 2019-06-09 NOTE — Discharge Instructions (Signed)
Your Head CT did not show any signs of breaks in the skulls or brain bleeds. Your symptoms are likely related to a concussion. Please see attached information - the best thing you can do is brain rest which includes sitting in a darkened room and avoiding bright lights including blue lights from cell phones, TV, tablets, etc.   Pick up medication to take as needed for nausea. You may take Ibuprofen and Tylenol as needed for pain.   Follow up with your PCP. You may also follow up with [facility]. Call their office to schedule an appointment.  [facility] is the only comprehensive, holistic concussion clinic in the [place], [place] area. You can speak with one of our concussion-trained staff members during our regular office hours, Monday - Thursday from 7:30 AM to 4:30 PM, and Fridays from 7:30 AM to 12:00 PM, by calling our Concussion Hotline: [phone].

## 2019-06-10 ENCOUNTER — Emergency Department (HOSPITAL_COMMUNITY)
Admission: EM | Admit: 2019-06-10 | Discharge: 2019-06-11 | Disposition: A | Discharge status: Other Acute Inpatient Hospital | Payer: Medicaid Other | Attending: Emergency Medicine | Admitting: Emergency Medicine

## 2019-06-10 ENCOUNTER — Other Ambulatory Visit: Payer: Self-pay

## 2019-06-10 DIAGNOSIS — Z7984 Long term (current) use of oral hypoglycemic drugs: Secondary | ICD-10-CM | POA: Diagnosis not present

## 2019-06-10 DIAGNOSIS — R45851 Suicidal ideations: Secondary | ICD-10-CM

## 2019-06-10 DIAGNOSIS — F17210 Nicotine dependence, cigarettes, uncomplicated: Secondary | ICD-10-CM | POA: Insufficient documentation

## 2019-06-10 DIAGNOSIS — Z20822 Contact with and (suspected) exposure to covid-19: Secondary | ICD-10-CM | POA: Diagnosis not present

## 2019-06-10 DIAGNOSIS — Z79899 Other long term (current) drug therapy: Secondary | ICD-10-CM | POA: Insufficient documentation

## 2019-06-10 DIAGNOSIS — E119 Type 2 diabetes mellitus without complications: Secondary | ICD-10-CM | POA: Insufficient documentation

## 2019-06-10 DIAGNOSIS — J45909 Unspecified asthma, uncomplicated: Secondary | ICD-10-CM | POA: Diagnosis not present

## 2019-06-10 DIAGNOSIS — T50992A Poisoning by other drugs, medicaments and biological substances, intentional self-harm, initial encounter: Secondary | ICD-10-CM | POA: Insufficient documentation

## 2019-06-10 DIAGNOSIS — T50902A Poisoning by unspecified drugs, medicaments and biological substances, intentional self-harm, initial encounter: Secondary | ICD-10-CM

## 2019-06-10 DIAGNOSIS — F332 Major depressive disorder, recurrent severe without psychotic features: Secondary | ICD-10-CM | POA: Insufficient documentation

## 2019-06-10 LAB — COMPREHENSIVE METABOLIC PANEL
ALT: 16 U/L (ref 0–44)
AST: 16 U/L (ref 15–41)
Albumin: 3.8 g/dL (ref 3.5–5.0)
Alkaline Phosphatase: 66 U/L (ref 38–126)
Anion gap: 9 (ref 5–15)
BUN: 6 mg/dL (ref 6–20)
CO2: 26 mmol/L (ref 22–32)
Calcium: 9 mg/dL (ref 8.9–10.3)
Chloride: 106 mmol/L (ref 98–111)
Creatinine, Ser: 0.77 mg/dL (ref 0.44–1.00)
GFR calc Af Amer: >60 mL/min (ref >60)
GFR calc non Af Amer: >60 mL/min (ref >60)
Glucose, Bld: 118 mg/dL — ABNORMAL HIGH (ref 70–99)
Potassium: 3.5 mmol/L (ref 3.5–5.1)
Sodium: 141 mmol/L (ref 135–145)
Total Bilirubin: 0.6 mg/dL (ref 0.3–1.2)
Total Protein: 7.2 g/dL (ref 6.5–8.1)

## 2019-06-10 LAB — CBC WITH DIFFERENTIAL/PLATELET
Abs Immature Granulocytes: 0.02 10*3/uL (ref 0.00–0.07)
Basophils Absolute: 0 10*3/uL (ref 0.0–0.1)
Basophils Relative: 1 %
Eosinophils Absolute: 0.1 10*3/uL (ref 0.0–0.5)
Eosinophils Relative: 2 %
HCT: 35.1 % — ABNORMAL LOW (ref 36.0–46.0)
Hemoglobin: 11.3 g/dL — ABNORMAL LOW (ref 12.0–15.0)
Immature Granulocytes: 0 %
Lymphocytes Relative: 40 %
Lymphs Abs: 2.4 10*3/uL (ref 0.7–4.0)
MCH: 28.5 pg (ref 26.0–34.0)
MCHC: 32.2 g/dL (ref 30.0–36.0)
MCV: 88.4 fL (ref 80.0–100.0)
Monocytes Absolute: 0.3 10*3/uL (ref 0.1–1.0)
Monocytes Relative: 6 %
Neutro Abs: 3.1 10*3/uL (ref 1.7–7.7)
Neutrophils Relative %: 51 %
Platelets: 386 10*3/uL (ref 150–400)
RBC: 3.97 MIL/uL (ref 3.87–5.11)
RDW: 16.1 % — ABNORMAL HIGH (ref 11.5–15.5)
WBC: 5.9 10*3/uL (ref 4.0–10.5)
nRBC: 0 % (ref 0.0–0.2)

## 2019-06-10 LAB — ACETAMINOPHEN LEVEL
Acetaminophen (Tylenol), Serum: <10 ug/mL — ABNORMAL LOW (ref 10–30)
Acetaminophen (Tylenol), Serum: <10 ug/mL — ABNORMAL LOW (ref 10–30)

## 2019-06-10 LAB — LACTIC ACID, PLASMA: Lactic Acid, Venous: 0.9 mmol/L (ref 0.5–1.9)

## 2019-06-10 LAB — CBG MONITORING, ED: Glucose-Capillary: 97 mg/dL (ref 70–99)

## 2019-06-10 LAB — RESPIRATORY PANEL BY RT PCR (FLU A&B, COVID)
Influenza A by PCR: NEGATIVE
Influenza B by PCR: NEGATIVE
SARS Coronavirus 2 by RT PCR: NEGATIVE

## 2019-06-10 LAB — ETHANOL: Alcohol, Ethyl (B): <10 mg/dL (ref <10)

## 2019-06-10 LAB — SALICYLATE LEVEL: Salicylate Lvl: <7 mg/dL — ABNORMAL LOW (ref 7.0–30.0)

## 2019-06-10 MED ORDER — METFORMIN HCL 500 MG PO TABS
1000.0000 mg | ORAL_TABLET | Freq: Two times a day (BID) | ORAL | Status: DC
  Administered 2019-06-10 – 2019-06-11 (×2): 1000 mg via ORAL
  Filled 2019-06-10 (×2): qty 2

## 2019-06-10 MED ORDER — ACETAMINOPHEN 500 MG PO TABS: 500.0000 mg | ORAL_TABLET | Freq: Four times a day (QID) | ORAL | Status: DC | PRN

## 2019-06-10 MED ORDER — ONDANSETRON 4 MG PO TBDP: 4.0000 mg | ORAL_TABLET | Freq: Three times a day (TID) | ORAL | Status: DC | PRN
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
## 2019-06-10 NOTE — [facility] Counselor (Signed)
Per access center, [facility] rescinding today's acceptance of Pt due to unforeseen circumstance (had to shut down several units).  They will review again tomorrow.

## 2019-06-10 NOTE — ED Notes (Signed)
Pt.s dinner has arrived, gave it to pt. Pt is sitting up and eating her dinner now.

## 2019-06-10 NOTE — Progress Notes (Signed)
Pt is under review at [facility] will need to be faxed to [phone] once completed.    Darletta Moll MSW, [place] Worker Disposition  [facility], [place] Ph: [phone] Fax: [phone]
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
## 2019-06-10 NOTE — ED Notes (Signed)
[facility] had a power emergency and had to move patients to different floor. Therefore, pt cannot go tonight. Day shift please call to see their status and bad availability. Spoke with Lonia Mad, LPN.

## 2019-06-10 NOTE — ED Notes (Signed)
Patient given sandwich per request.

## 2019-06-10 NOTE — ED Notes (Signed)
TTS monitor placed at bedside 

## 2019-06-10 NOTE — ED Triage Notes (Signed)
Pt BIBA from home.   Per EMS=- Pt reports SI, taking "a handful" of family members buspirone. En route to ED pt then reported taking "4 pills."  Unknown actual amount ingested.   EMS called by friend. Daughter called friend who had told her she "took a bunch of pills so she could go to sleep for a long time."   EMS arrived with 24 white, rectangular pills in glove. EMS reports pt initially very combative, would not answer questioning.

## 2019-06-10 NOTE — Progress Notes (Signed)
Patient meets criteria for inpatient treatment per Merlyn Lot, NP. No appropriate beds at [facility] currently. CSW faxed referrals to the following facilities for review:  [facility]   [facility]  [facility]  [place]   TTS will continue to seek bed placement.     Darletta Moll MSW, [place] Worker Disposition  [facility] Ph: [phone] Fax: [phone] 06/10/2019 3:06 PM

## 2019-06-10 NOTE — ED Notes (Signed)
Pt is not cooperative with Unit Protocols. Will not remove jeans and bra. Wearing a hospital gown over bra. On a cardiac monitor at this time.

## 2019-06-10 NOTE — ED Notes (Signed)
Tried to call report. No answer.  Faxed IVC to [facility] at [phone],

## 2019-06-10 NOTE — BHH Counselor (Signed)
Disposition: Merlyn Lot, PMHNP recommends in patient treatment. [facility] at capacity. TTS to seek placement. Morey Hummingbird, RN notified of disposition.

## 2019-06-10 NOTE — ED Notes (Signed)
Kristi with [employer] said that pt is clear from [employer].

## 2019-06-10 NOTE — ED Notes (Signed)
Pt arrived via EMS with [employer].  Pt presents with flat affect, very short, 1-2 word answers.  Pt denies SI at this time, reports "I only took what im prescribed... I took 4."  Pt denies SI/HI.

## 2019-06-10 NOTE — ED Notes (Signed)
Tried to call report. No answer.

## 2019-06-10 NOTE — ED Notes (Signed)
Pt reports feeling like "my sugar is dropping."  CBG checked.  Pt provided [place] sandwich.  Will continue to monitor.

## 2019-06-10 NOTE — ED Notes (Signed)
Faxed copy of IVC to [facility].

## 2019-06-10 NOTE — ED Notes (Signed)
Pt asked for something to eat. Gave pt 2 cheese sticks and 1 coca cola
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
## 2019-06-10 NOTE — Progress Notes (Addendum)
Pt accepted to [facility] Dr. Camie Patience is the accepting provider.  Call report to [phone] Diane, RN @ [facility] ED notified.   Pt is IVC   Pt may be transported by [facility].  Pt scheduled to arrive as soon as transportation has been set up.    IVC paperwork will need to be faxed to [phone] before transported to [place] MSW, [facility] Ph: [phone] Fax: [phone]  06/10/2019 4:26 PM

## 2019-06-10 NOTE — BH Assessment (Signed)
Received call from [facility] states their physician, Marylou Flesher MD, accepted Pt. She can be transferred at any time. RN report number: [phone]. Notified Dr Alvino Chapel and TCU staff of acceptance.   Evelena Peat, [facility], [facility] Triage Specialist [phone]

## 2019-06-11 ENCOUNTER — Encounter: Payer: Self-pay | Admitting: Family Medicine

## 2019-06-11 LAB — RAPID URINE DRUG SCREEN, HOSP PERFORMED
Amphetamines: NOT DETECTED
Barbiturates: NOT DETECTED
Benzodiazepines: NOT DETECTED
Cocaine: NOT DETECTED
Opiates: NOT DETECTED
Tetrahydrocannabinol: POSITIVE — AB

## 2019-06-11 NOTE — Discharge Summary (Signed)
  Patient is to be transferred to [facility] for inpatient psychiatric treatment

## 2019-06-11 NOTE — ED Notes (Signed)
Sheriff notified of need for transport.

## 2019-06-11 NOTE — ED Notes (Signed)
Pt.s breakfast has arrived, sat pt up to eat her breakfast. Pt is currently eating her breakfast while watching the tv. Will continue to monitor pt.

## 2019-06-11 NOTE — ED Notes (Signed)
Went to take vitals on pt. Told pt that they will be picking her up soon and that if she wanted too, she could use the restroom now. Pt became visibly upset and verbally said "I will not be going to another facility. I have a life. I have a daughter to take care of.". Told pt that I understand but that she has been accepted to another facility. Told pt that the RN will be in her room for a bit.

## 2019-06-11 NOTE — ED Notes (Signed)
Pt mother left contact info  [name] [phone].

## 2019-07-23 IMAGING — US US MFM OB TRANSVAGINAL
1 series · 15 of 28 positions shown · non-contrast
Comparison: none

[Series 1: us mfm ob transvaginal · 33 acquisitions, 15 frames shown]
[im 1/33]
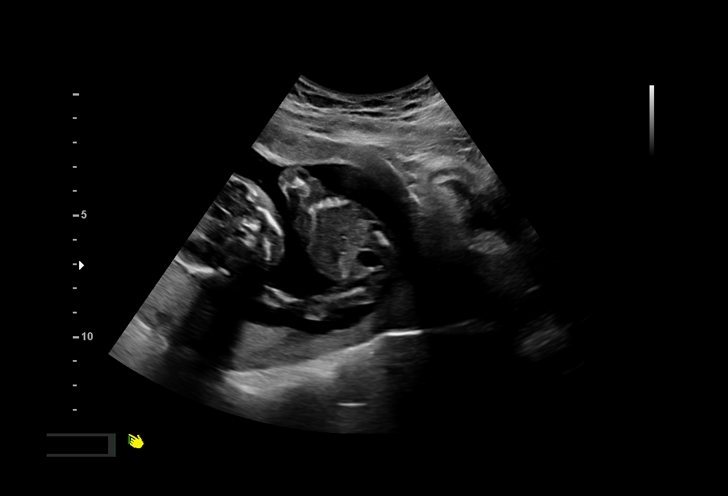
[im 3/33]
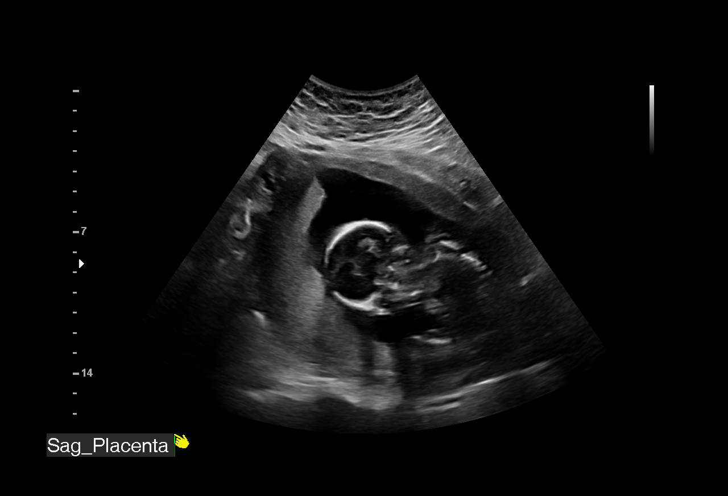
[im 5/33]
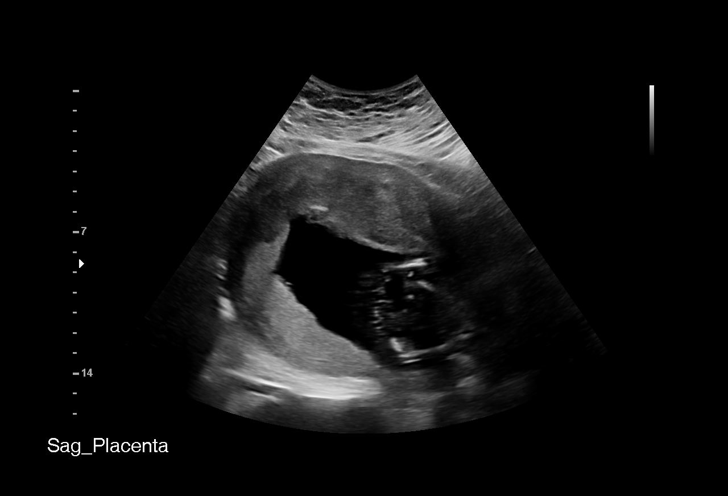
[im 8/33]
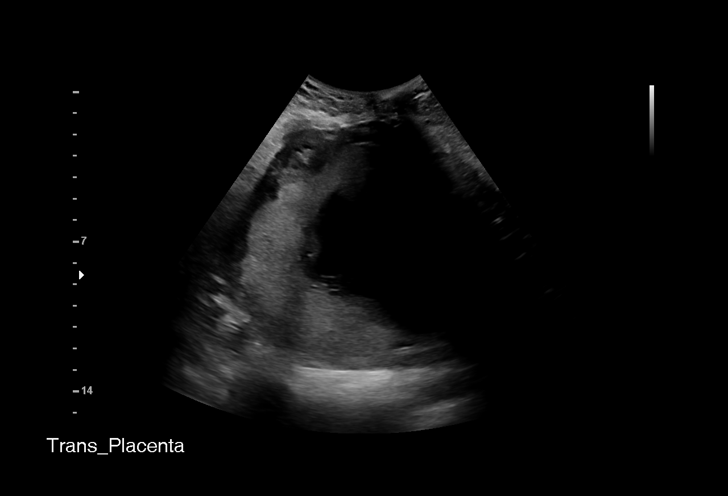
[im 10/33]
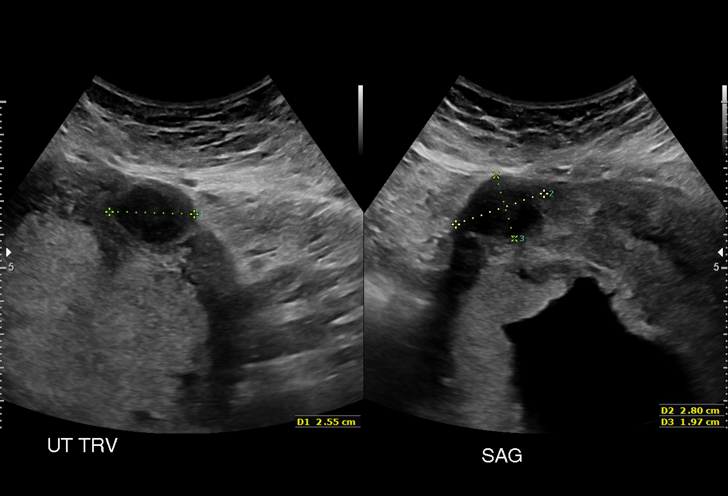
[im 12/33]
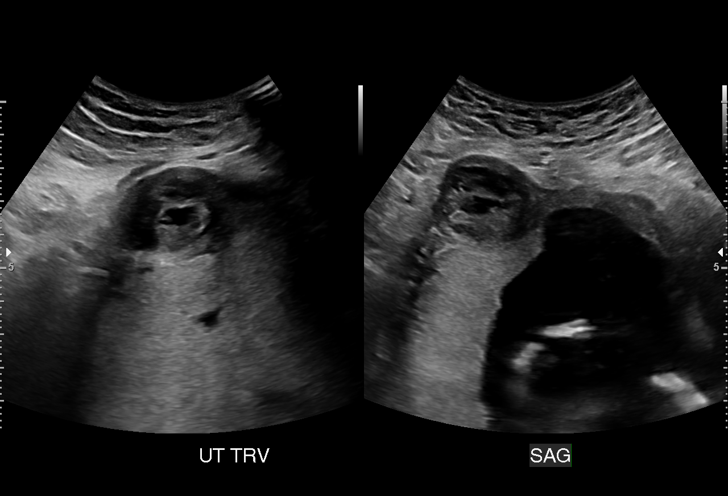
[im 15/33]
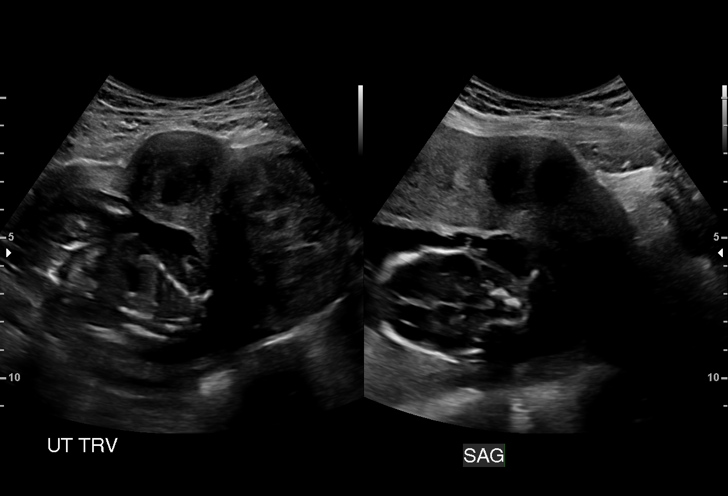
[im 17/33]
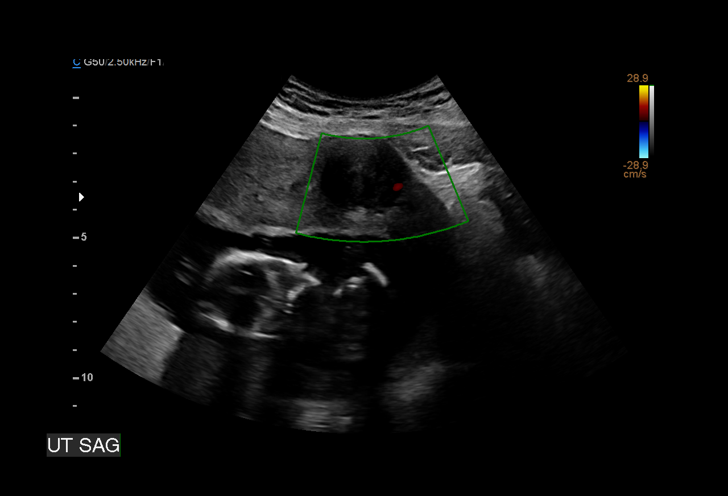
[im 18/33]
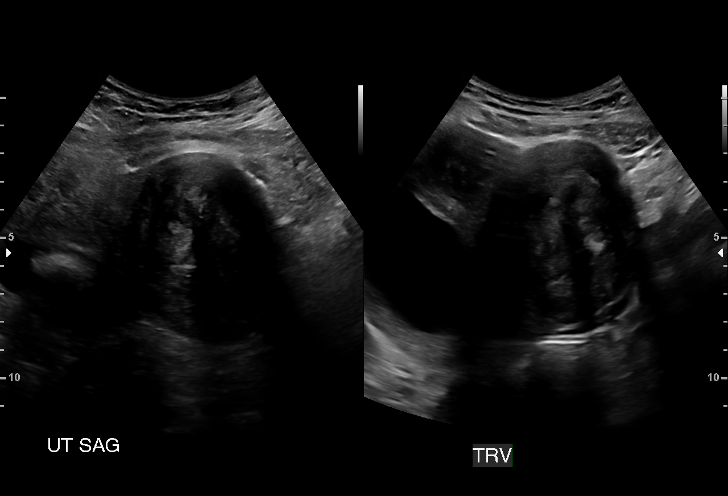
[im 21/33]
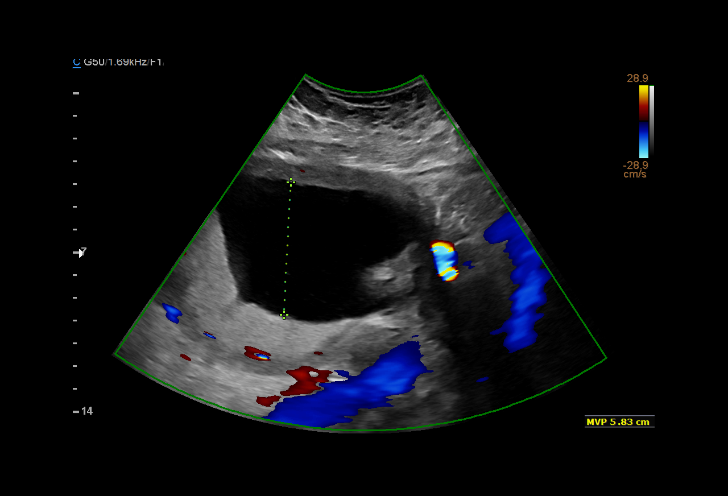
[im 23/33]
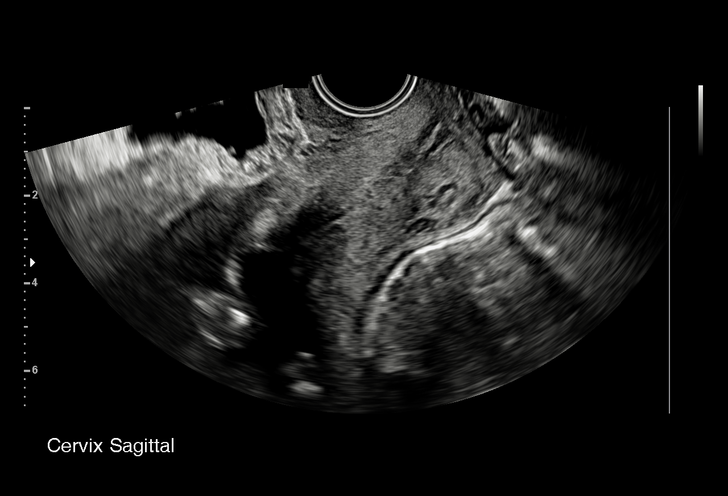
[im 25/33]
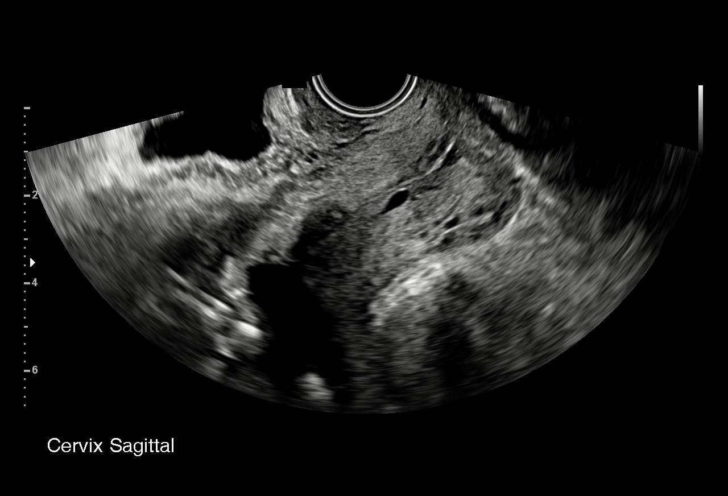
[im 28/33]
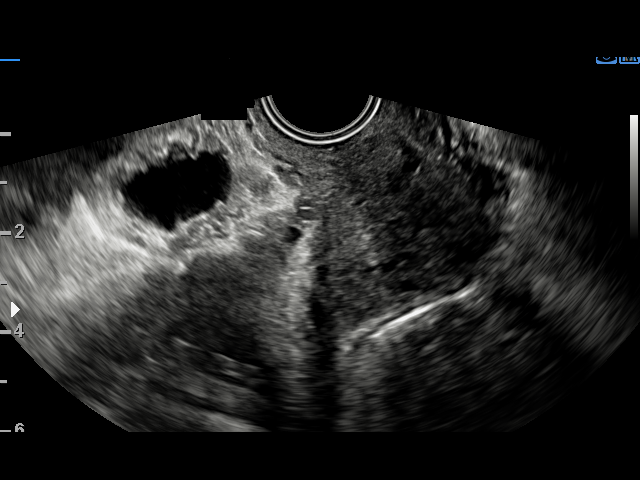
[im 30/33]
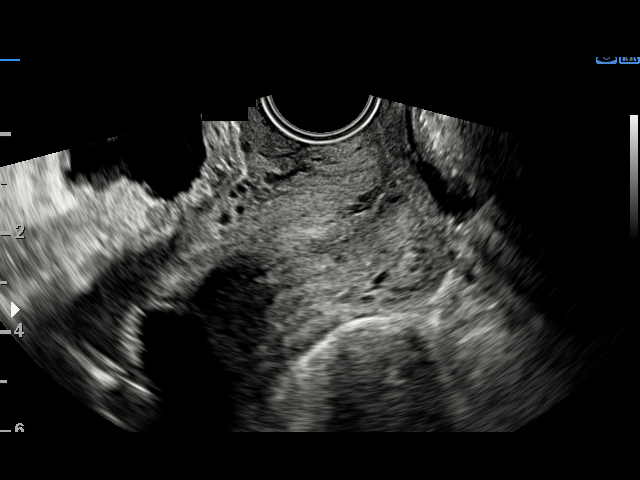
[im 33/33]
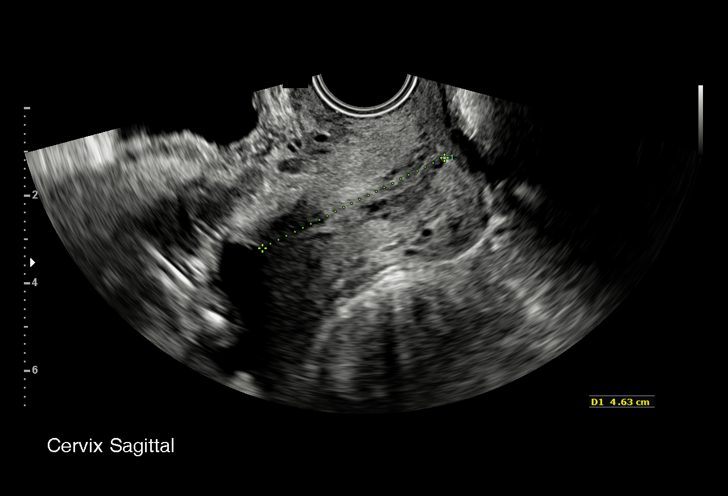

[15 of 28 positions shown; findings below may reference images not displayed]

CNM

 ----------------------------------------------------------------------

 ----------------------------------------------------------------------
Indications

  Encounter for cervical length
  17 weeks gestation of pregnancy
  Abdominal pain in pregnancy
  Poor obstetric history (prior pre-term
  delivery) (36w x2--Induced for poor glycemic
  control)
  Pre-existing diabetes, type 2, in pregnancy,
  second trimester (Insulin)
  Asthma (Inhaler)                               OYY.9Y j19.121
  Poor obstetric history: Previous gestational
  diabetes
  Obesity complicating pregnancy, second
  trimester
  Smoking complicating pregnancy, second
  trimester (0.25 pk/day)
  Uterine fibroids affecting pregnancy in        O34.12,
  second trimester, antepartum
 ----------------------------------------------------------------------
Vital Signs

 BMI:
Fetal Evaluation

 Num Of Fetuses:          1
 Fetal Heart Rate(bpm):   141
 Cardiac Activity:        Observed
 Presentation:            Breech
 Placenta:                Posterior

 Amniotic Fluid
 AFI FV:      Within normal limits
                             Largest Pocket(cm)

OB History

 Gravidity:    4         Term:   1        Prem:   2
 Living:       2
Gestational Age

 LMP:           17w 4d        Date:  10/02/17                 EDD:   07/09/18
 Best:          17w 4d     Det. By:  LMP  (10/02/17)          EDD:   07/09/18
Cervix Uterus Adnexa

 Cervix
 Length:            4.5  cm.
 Normal appearance by transvaginal scan

 Uterus
 Multiple fibroids noted, see table below.
Myomas

  Site                     L(cm)      W(cm)      D(cm)       Location
  Anterior                 2.8        2.6        2
  Anterior                 3.3        3.4        3
  Anterior, Mid
  LUS
 ----------------------------------------------------------------------

  Blood Flow                 RI        PI       Comments

 ----------------------------------------------------------------------
Impression

 Normal cervical length
 cannot rule out placental abruption
 Fibroid uterus with heterogenous appearance may be
 indicative of fibroid degeneration
Recommendations

 Follow up as clinically indicated.

## 2019-09-04 ENCOUNTER — Telehealth: Payer: Self-pay | Admitting: Family Medicine

## 2019-09-04 NOTE — Telephone Encounter (Signed)
**  After Hours/ Emergency Line Call**  Received a call to report that Grace Montgomery is having headache since yesterday.  Endorsing headache that started yesterday.  Took Tylenol 1000mg  that did not help.  Went to bed and sat in a dark room that made her feel better and she was able to sleep.  Woke up again this AM and had a headache.  All over her head, feels like pounding.  Associated with nausea when she was at work, no vomiting.  Got a little better throughout the day at work, then when she came home, felt like the pain got worse again.  Denies changes in vision, but states that she has chronic blurry vision at baseline.  No changes in mental status.  No fevers.  Denies neck pain, reports full ROM of neck.  No change in pain with positional changes.  Reports a history of headaches, but not lasting this long.  Checked Bps three times with headache, 120/77, 107/68, 109/70.  LMP 6/21.  Discussed options with patient including Urgent Care, trying OTC headache treatment again (reports allergy to NSAIDs, therefore tylenol and caffeine recommended), or appointment tomorrow.  Patient states that she does not want to leave her house tonight and would like to sit in a dark room and see if that will help her headache.  She would like to call clinic in the AM for an appointment if headache not improved.  Red flags discussed with reasons to present to ED including worsening of pain, new changes in vision, fever, neck pain.  Will forward to PCP.  If patient's headache continues to occur when she is at home and improve when she is not, consider carbon monoxide poisoning, although at this time, migraine headache is much more likely cause.    [place]zona Constable, DO PGY-2, [facility] 09/04/2019 7:50 PM

## 2019-09-17 ENCOUNTER — Other Ambulatory Visit: Payer: Self-pay

## 2019-09-17 ENCOUNTER — Ambulatory Visit: Payer: Medicaid Other

## 2019-09-17 ENCOUNTER — Encounter (HOSPITAL_COMMUNITY): Payer: Self-pay

## 2019-09-17 ENCOUNTER — Ambulatory Visit (HOSPITAL_COMMUNITY)
Admission: EM | Admit: 2019-09-17 | Discharge: 2019-09-17 | Disposition: A | Discharge status: Home / Self Care | Payer: Medicaid Other | Attending: Physician Assistant | Admitting: Physician Assistant

## 2019-09-17 DIAGNOSIS — Z202 Contact with and (suspected) exposure to infections with a predominantly sexual mode of transmission: Secondary | ICD-10-CM | POA: Diagnosis present

## 2019-09-17 DIAGNOSIS — N898 Other specified noninflammatory disorders of vagina: Secondary | ICD-10-CM | POA: Diagnosis not present

## 2019-09-17 DIAGNOSIS — Z3202 Encounter for pregnancy test, result negative: Secondary | ICD-10-CM | POA: Diagnosis not present

## 2019-09-17 LAB — POCT URINALYSIS DIP (DEVICE)
Bilirubin Urine: NEGATIVE
Glucose, UA: NEGATIVE mg/dL
Ketones, ur: NEGATIVE mg/dL
Nitrite: NEGATIVE
Protein, ur: NEGATIVE mg/dL
Specific Gravity, Urine: 1.02 (ref 1.005–1.030)
Urobilinogen, UA: 0.2 mg/dL (ref 0.0–1.0)
pH: 6 (ref 5.0–8.0)

## 2019-09-17 LAB — POC URINE PREG, ED: Preg Test, Ur: NEGATIVE

## 2019-09-17 MED ORDER — METRONIDAZOLE 500 MG PO TABS: 500.0000 mg | ORAL_TABLET | Freq: Two times a day (BID) | ORAL | 0 refills | Status: DC

## 2019-09-17 NOTE — ED Provider Notes (Signed)
[place]    CSN: [id number] Arrival date & time: 09/17/19  1355      History   Chief Complaint Chief Complaint  Patient presents with  . vaginal itching    HPI Grace Montgomery is a 29 y.o. female.   Patient presents for vaginal itching, discharge and due to recent exposure to trichomonas. Discharge and itching started yesterday. She denies vaginal or pelvic pain. Denies painful urination, frequency or urgency. No fevers or chills. Declines blood work testing to HIV and syphillis today.      Past Medical History:  Diagnosis Date  . Anxiety   . Asthma, mild persistent 06/29/2011  . Bipolar affective disorder ([place]) 09/22/2018  . Cannabis dependence   . Diabetes mellitus without complication ([place])   . 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 Marijuana use 10/26/2018  . Maternal morbid obesity, 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[place]) 06/29/2011  . 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 Rh negative state in antepartum period 03/02/2018   Will need rho gam  . S/P emergency cesarean section 06/18/2018  . Seasonal allergies 06/29/2011   On zyrtec OTC    . Suicidal behavior    2013, 2018  . Supervision of high risk pregnancy, antepartum 01/12/2018    Nursing Staff Provider Office Location  [facility] Dating  [place] and 9.1 week [place] 12/08/18 Language  english Anatomy US   02/13/18 Flu Vaccine  Declined 01/12/18 Genetic Screen  NIPS: low risk  AFP: neg   TDaP vaccine   04/19/2018 Hgb A1C or  GTT Early  Third trimester  Rhogam  04/19/2018   LAB RESULTS  Feeding Plan both Blood Type --/--/A NEG (03/13 0947) A NEG  Contraception  condoms Antibody POS (03  . Tobacco use 09/18/2018  . Type 2 diabetes mellitus complicating pregnancy in third trimester, antepartum 01/05/2018   Current Diabetic Medications:  Insulin  [x]  Aspirin 81 mg daily after 12 weeks (? A2/B GDM)  For A2/B GDM or higher classes of DM [x]  Diabetes Education and Testing  Supplies [x]  Nutrition Counsult [ ]  Fetal ECHO after 20 weeks - ordered 12/26 [ ]  Eye exam for retina evaluation   Baseline and surveillance labs (pulled in from EPIC, refresh links as needed)  Lab Results Component Value Date  CREATIN    Patient Active Problem List   Diagnosis Date Noted  . Concussion 06/09/2019  . Acute post-traumatic headache 06/09/2019  . Carpal tunnel syndrome of left wrist 02/16/2019  . Severe recurrent major depression without psychotic features ([place]) 10/10/2018  . Morbid obesity ([place]) 09/22/2018  . Bipolar affective disorder ([place]) 09/22/2018  . Tobacco use 09/18/2018  . Poorly controlled type 2 diabetes mellitus ([place]) 11/06/2016  . Exercise-induced asthma February 07, 1990    Past Surgical History:  Procedure Laterality Date  . CESAREAN SECTION N/A 06/19/2018   Procedure: CESAREAN SECTION;  Surgeon: Osborne Oman, MD;  Location: [facility];  Service: Obstetrics;  Laterality: N/A;  . NO PAST SURGERIES      OB History    Gravida  6   Para  4   Term  1   Preterm  3   AB  1   Living  4     SAB  1   TAB  0   Ectopic  0   Multiple  0   Live Births  4            Home Medications  Prior to Admission medications   Medication Sig Start Date End Date Taking? Authorizing Provider  acetaminophen (TYLENOL) 500 MG tablet Take 500 mg by mouth every 6 (six) hours as needed for mild pain, moderate pain or headache.    [provider]  citalopram (CELEXA) 20 MG tablet Take 1 tablet (20 mg total) by mouth daily. 10/13/18   Connye Burkitt, NP  hydrOXYzine (ATARAX/VISTARIL) 25 MG tablet Take 1 tablet (25 mg total) by mouth 3 (three) times daily as needed for anxiety. Patient not taking: Reported on 05/13/2019 10/12/18   Connye Burkitt, NP  JARDIANCE 25 MG TABS tablet Take 25 mg by mouth daily. 04/16/19   [provider]  levonorgestrel-ethinyl estradiol (SEASONALE) 0.15-0.03 MG tablet Take 1 tablet by mouth daily. 10/11/18   McDiarmid, Blane Ohara, MD    metFORMIN (GLUCOPHAGE) 1000 MG tablet Take 1 tablet (1,000 mg total) by mouth 2 (two) times daily with a meal. 09/21/18   McDiarmid, Blane Ohara, MD  metroNIDAZOLE (FLAGYL) 500 MG tablet Take 1 tablet (500 mg total) by mouth 2 (two) times daily. 09/17/19   Jacarri Gesner, Marguerita Beards, PA-C  ondansetron (ZOFRAN ODT) 4 MG disintegrating tablet Take 1 tablet (4 mg total) by mouth every 8 (eight) hours as needed for nausea or vomiting. 06/09/19   Eustaquio Maize, PA-C  Prenatal Vit-Fe Fumarate-FA (PRENATAL VITAMIN) 27-0.8 MG TABS Take 1 tablet by mouth daily. 02/14/19   Sherene Sires, DO    Family History Family History  Problem Relation Age of Onset  . Sickle cell anemia Mother   . Depression Mother   . Diabetes Mother   . Asthma Mother   . Diabetes Other        grandparents and aunts/uncles  . Stroke Other        grandparent  . Alcohol abuse Father   . Depression Father   . Alcohol abuse Brother   . Drug abuse Brother     Social History Social History   Tobacco Use  . Smoking status: Current Some Day Smoker    Packs/day: 0.25    Types: Cigarettes  . Smokeless tobacco: Never Used  Vaping Use  . Vaping Use: Never used  Substance Use Topics  . Alcohol use: Not Currently    Comment: ocassionally  . Drug use: Not Currently    Comment: History of Marijuana - a few ago from  February 16, 2019     Allergies   Monistat [tioconazole], Chocolate, Ibuprofen, and Banana   Review of Systems Review of Systems   Physical Exam Triage Vital Signs ED Triage Vitals  Enc Vitals Group     BP 09/17/19 1523 120/88     Pulse Rate 09/17/19 1523 79     Resp 09/17/19 1523 16     Temp 09/17/19 1523 98.7 F (37.1 C)     Temp Source 09/17/19 1523 Oral     SpO2 09/17/19 1523 100 %     Weight 09/17/19 1526 210 lb (95.3 kg)     Height 09/17/19 1526 5\' 3"  (1.6 m)     Head Circumference --      Peak Flow --      Pain Score 09/17/19 1526 0     Pain Loc --      Pain Edu? --      Excl. in [place]? --    No data  found.  Updated Vital Signs BP 120/88   Pulse 79   Temp 98.7 F (37.1 C) (Oral)  Resp 16   Ht 5\' 3"  (1.6 m)   Wt 210 lb (95.3 kg)   LMP 12/12/2018   SpO2 100%   BMI 37.20 kg/m   Visual Acuity Right Eye Distance:   Left Eye Distance:   Bilateral Distance:    Right Eye Near:   Left Eye Near:    Bilateral Near:     Physical Exam Vitals and nursing note reviewed.  Constitutional:      General: She is not in acute distress.    Appearance: She is well-developed. She is not ill-appearing.  HENT:     Head: Normocephalic and atraumatic.  Eyes:     Conjunctiva/sclera: Conjunctivae normal.  Cardiovascular:     Rate and Rhythm: Normal rate and regular rhythm.     Heart sounds: No murmur heard.   Pulmonary:     Effort: Pulmonary effort is normal. No respiratory distress.     Breath sounds: Normal breath sounds.  Abdominal:     Palpations: Abdomen is soft.     Tenderness: There is no abdominal tenderness. There is no right CVA tenderness or left CVA tenderness.  Genitourinary:    Comments: Deferred for self swab Musculoskeletal:     Cervical back: Neck supple.  Skin:    General: Skin is warm and dry.  Neurological:     Mental Status: She is alert.      UC Treatments / Results  Labs (all labs ordered are listed, but only abnormal results are displayed) Labs Reviewed  POCT URINALYSIS DIP (DEVICE) - Abnormal; Notable for the following components:      Result Value   Hgb urine dipstick TRACE (*)    Leukocytes,Ua TRACE (*)    All other components within normal limits  POC URINE PREG, ED  CERVICOVAGINAL ANCILLARY ONLY    EKG   Radiology No results found.  Procedures Procedures (including critical care time)  Medications Ordered in UC Medications - No data to display  Initial Impression / Assessment and Plan / UC Course  I have reviewed the triage vital signs and the nursing notes.  Pertinent labs & imaging results that were available during my care of  the patient were reviewed by me and considered in my medical decision making (see chart for details).     #Exposure to trichomonas #vaginal discharge Patient is a 29 year old female with vaginal discharge and recent exposure to trichomonas. UA with leukocytes, culture sent, likely 2/2 to discharge. Will start on metronidazole BID x 7 days to cover trichomonas and BV. Return and follow up precautions discussed. Instructed to abstain until all results and treatments complete. Declined HIV and RPR today. Patient verbalized understanding plan of care and instructions.  Final Clinical Impressions(s) / UC Diagnoses   Final diagnoses:  Exposure to trichomonas  Vaginal discharge     Discharge Instructions     We have sent your tests and will call if we need to change anything  Take the metronidazole 2 times a day for 7 days - do not drink alcohol while taking  Abstain from sex until all tests and treatments are complete, ensure the partner has been treated as well      ED Prescriptions    Medication Sig Dispense Auth. Provider   metroNIDAZOLE (FLAGYL) 500 MG tablet Take 1 tablet (500 mg total) by mouth 2 (two) times daily. 14 tablet Bharath Bernstein, Marguerita Beards, PA-C     PDMP not reviewed this encounter.   Grace Shoemaker, PA-C 09/17/19 2339

## 2019-09-17 NOTE — Discharge Instructions (Signed)
We have sent your tests and will call if we need to change anything  Take the metronidazole 2 times a day for 7 days - do not drink alcohol while taking  Abstain from sex until all tests and treatments are complete, ensure the partner has been treated as well

## 2019-09-17 NOTE — ED Triage Notes (Signed)
Pt c/o vaginal itching started yesterday. Pt states sexual partner tested + for trich.

## 2019-09-18 ENCOUNTER — Telehealth (HOSPITAL_COMMUNITY): Payer: Self-pay | Admitting: Emergency Medicine

## 2019-09-18 LAB — CERVICOVAGINAL ANCILLARY ONLY
Bacterial Vaginitis (gardnerella): POSITIVE — AB
Candida Glabrata: NEGATIVE
Candida Vaginitis: NEGATIVE
Chlamydia: NEGATIVE
Comment: NEGATIVE
Comment: NEGATIVE
Comment: NEGATIVE
Comment: NEGATIVE
Comment: NEGATIVE
Comment: NORMAL
Neisseria Gonorrhea: NEGATIVE
Trichomonas: POSITIVE — AB

## 2019-09-18 NOTE — Telephone Encounter (Signed)
Bacterial Vaginosis test is positive.  Prescription for metronidazole was given at the urgent care visit. Pt contacted regarding results. Answered all questions. Verbalized understanding.   Trichomonas is positive. Rx metronidazole was given at the urgent care visit. Pt needs education to please refrain from sexual intercourse for 7 days to give the medicine time to work. Sexual partners need to be notified and tested/treated. Condoms may reduce risk of reinfection. Recheck for further evaluation if symptoms are not improving.   Spoke to pt and verbalized understanding

## 2019-10-27 IMAGING — US US MFM OB FOLLOW-UP
1 series · 14 of 28 positions shown · non-contrast
Comparison: none

[Series 1: us mfm ob follow-up · 31 acquisitions, 14 frames shown]
[im 2/31]
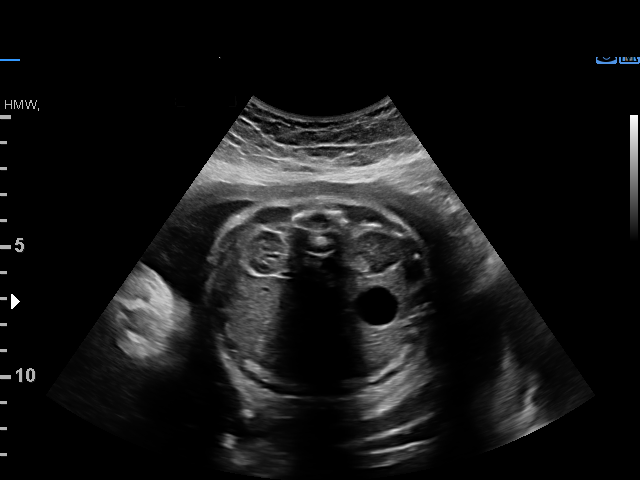
[im 4/31]
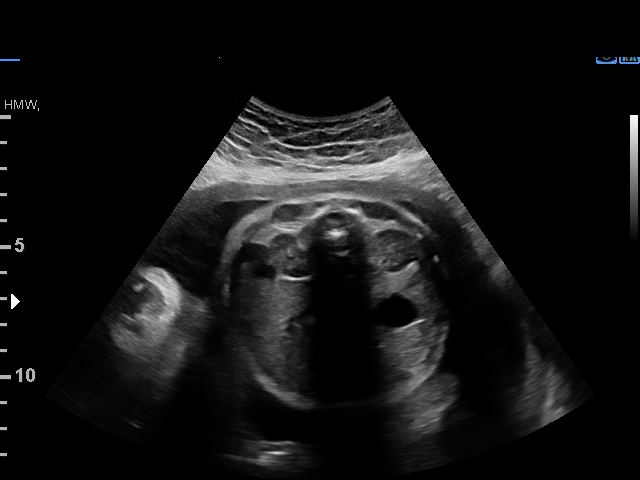
[im 6/31]
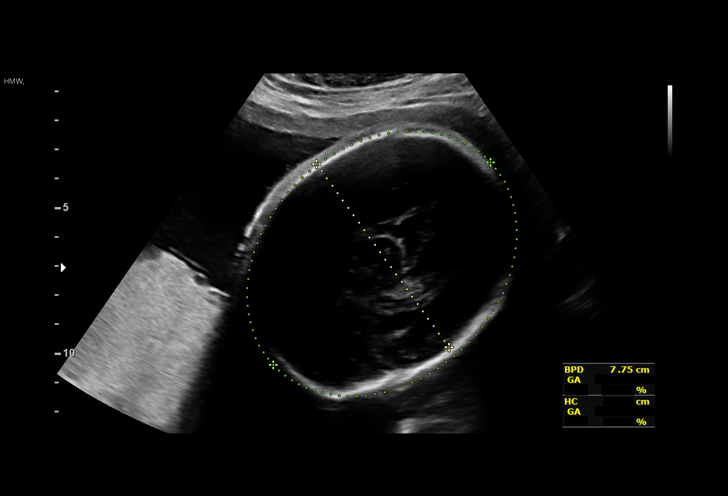
[im 8/31]
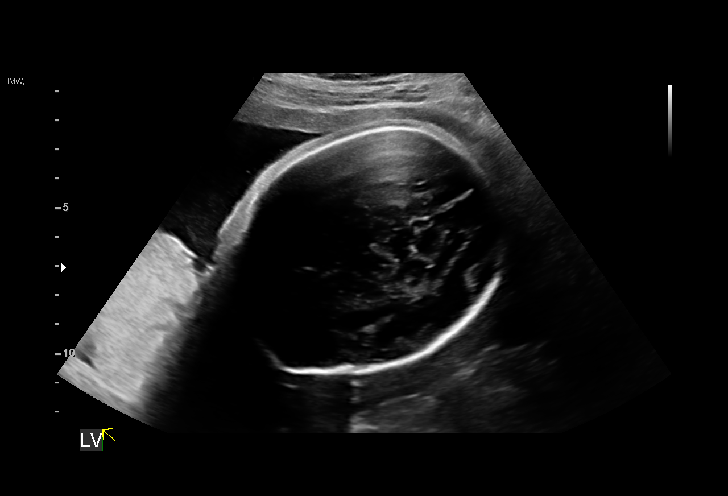
[im 11/31]
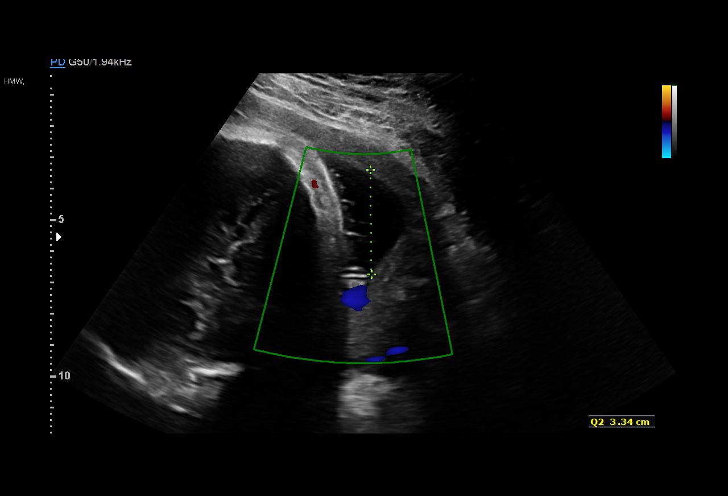
[im 13/31]
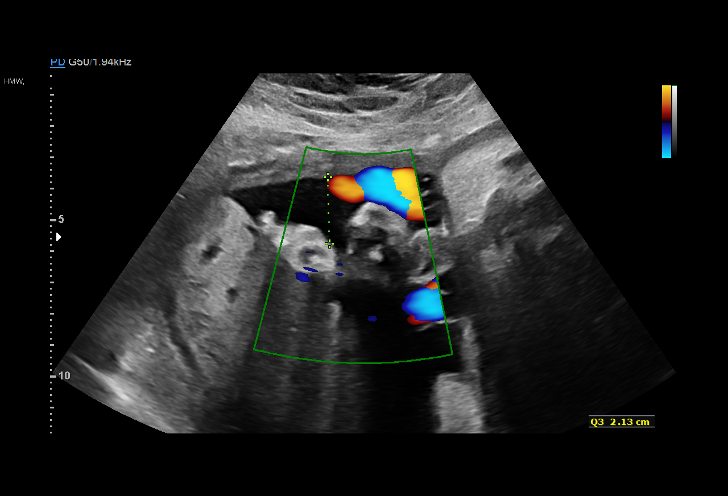
[im 15/31]
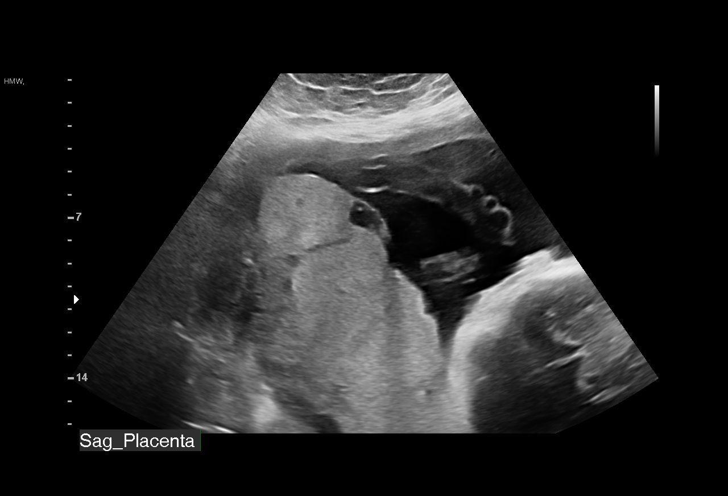
[im 17/31]
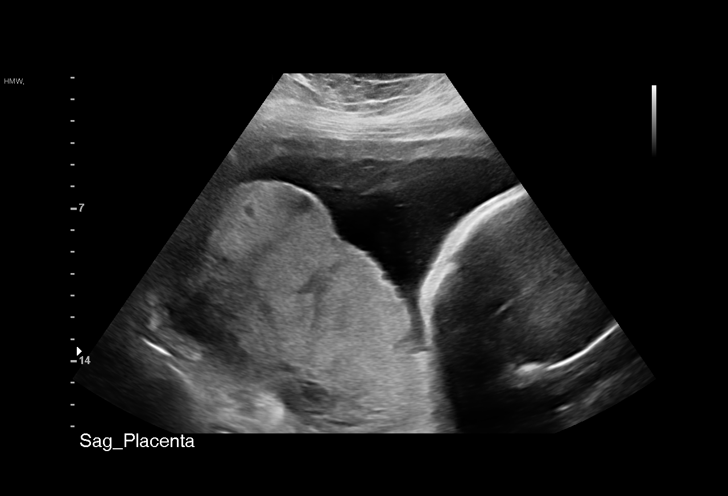
[im 19/31]
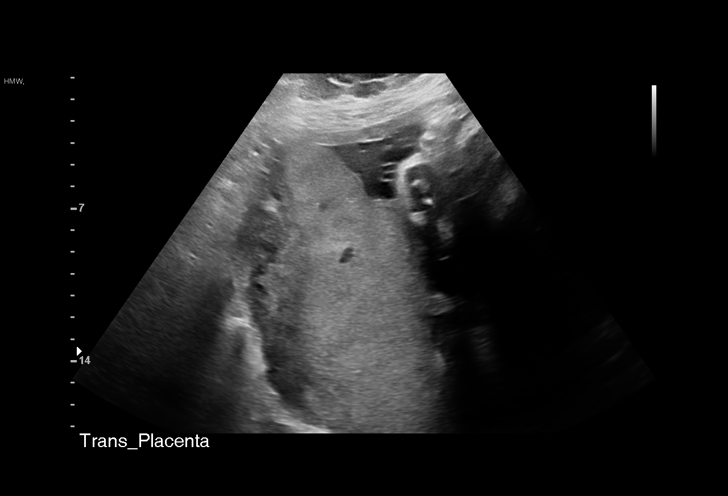
[im 22/31]
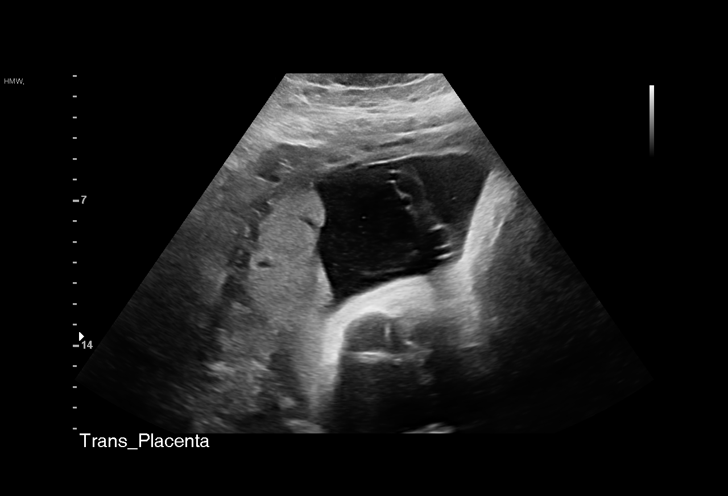
[im 24/31]
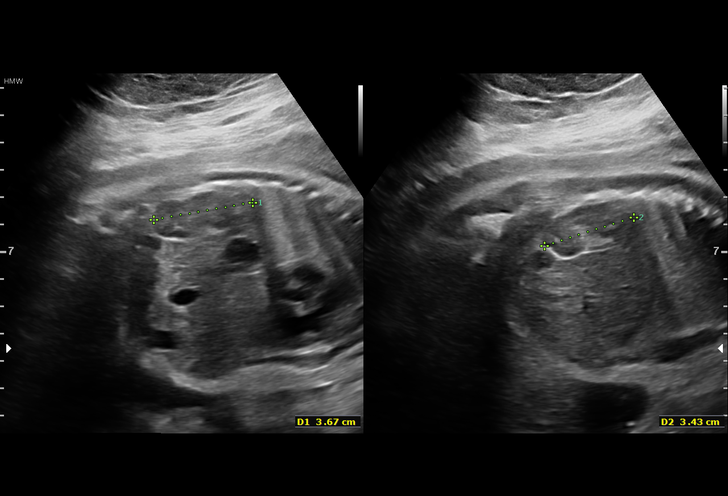
[im 26/31]
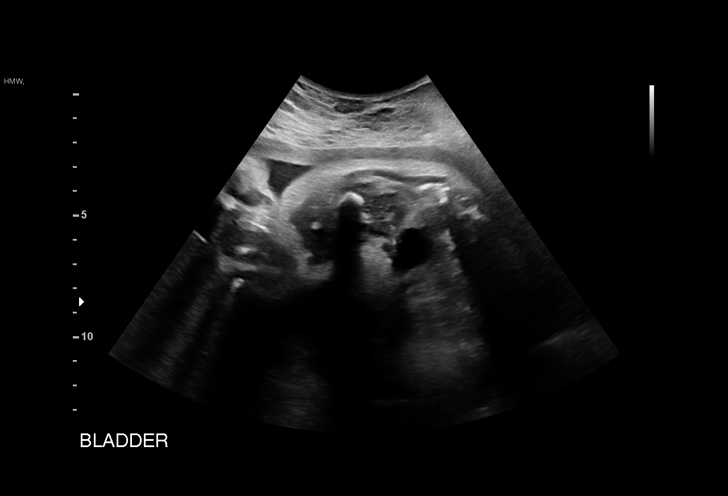
[im 28/31]
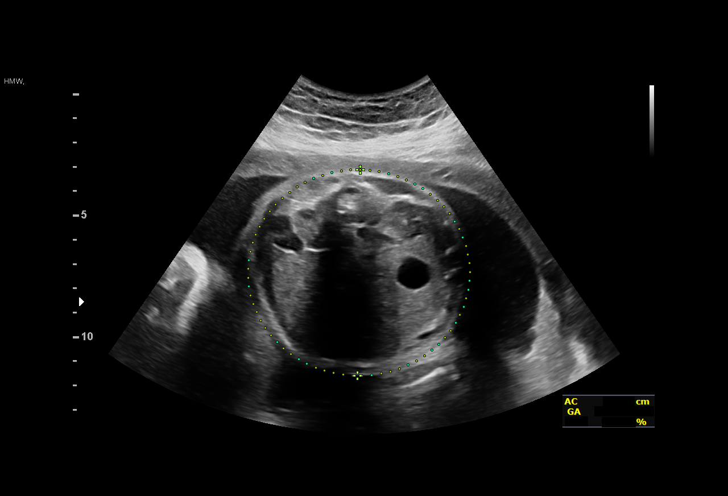
[im 31/31]
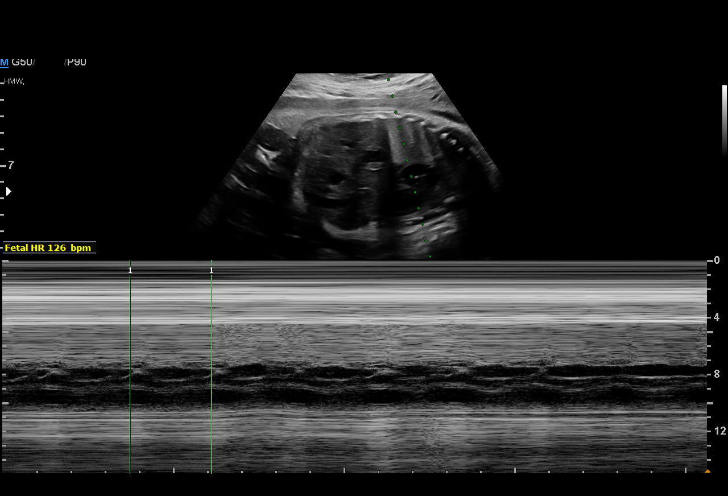

[14 of 28 positions shown; findings below may reference images not displayed]

CNM

 ----------------------------------------------------------------------

 ----------------------------------------------------------------------
Indications

  Pre-existing diabetes, type 2, in pregnancy,
  second trimester (Insulin)
  Poor obstetric history (prior pre-term delivery)XP7.9H7
  (36w x2--Induced for poor glycemic control)
  Asthma (Inhaler)                                T44.T4 j41.828
  Poor obstetric history: Previous gestational
  diabetes
  Obesity complicating pregnancy, second
  trimester
  Smoking complicating pregnancy, second
  trimester (0.25 pk/day)
  Uterine fibroids affecting pregnancy in second  O34.12,
  trimester, antepartum
  Encounter for other antenatal screening
  follow-up
  31 weeks gestation of pregnancy
 ----------------------------------------------------------------------
Vital Signs

                                                 Height:        5'3"
Fetal Evaluation

 Num Of Fetuses:          1
 Fetal Heart Rate(bpm):   126
 Cardiac Activity:        Observed
 Presentation:            Cephalic
 Placenta:                Posterior
 P. Cord Insertion:       Previously seen as normal
 Amniotic Fluid
 AFI FV:      Within normal limits

 AFI Sum(cm)     %Tile       Largest Pocket(cm)
 15.38           55

 RUQ(cm)       RLQ(cm)        LUQ(cm)        LLQ(cm)

Biometry

 BPD:      77.3   mm     G. Age:  31w 0d         31  %    CI:          73.4  %    70 - 86
                                                          FL/HC:       20.5  %    19.3 -
 HC:      286.7   mm     G. Age:  31w 3d         21  %    HC/AC:       1.03       0.96 -
 AC:      277.6   mm     G. Age:  31w 6d         63  %    FL/BPD:      76.1  %    71 - 87
 FL:       58.8   mm     G. Age:  30w 5d         22  %    FL/AC:       21.2  %    20 - 24
 HUM:      51.8   mm     G. Age:  30w 2d         30  %

 Est. FW:    3788   gm    3 lb 14 oz      57  %
OB History

 Gravidity:     4         Term:  1          Prem:  2
 Living:        2
Gestational Age

 LMP:            31w 2d       Date:  10/02/17                   EDD:  07/09/18
 U/S Today:      31w 2d                                         EDD:  07/09/18
 Best:           31w 2d    Det. By:  LMP  (10/02/17)            EDD:  07/09/18
Anatomy

 Cranium:                Appears normal         Aortic Arch:            Previously seen
 Cavum:                  Previously seen        Ductal Arch:            Previously seen
 Ventricles:             Appears normal         Diaphragm:              Previously seen
 Choroid Plexus:         Previously seen        Stomach:                Appears normal, left
                                                                        sided
 Cerebellum:             Previously seen        Abdomen:                Previously seen
 Posterior Fossa:        Previously seen        Abdominal Wall:         Previously seen
 Nuchal Fold:            Previously seen        Cord Vessels:           Previously seen
 Face:                   Orbits and profile     Kidneys:                Appear normal
                         previously seen
 Lips:                   Previously seen        Bladder:                Appears normal
 Thoracic:               Appears normal         Spine:                  Previously seen
 Heart:                  Previously seen        Upper Extremities:      Previously seen
 RVOT:                   Previously seen        Lower Extremities:      Previously seen
 LVOT:                   Previously seen

 Other:   Male gender. Heels and 5th digit previously visualized. Open hands
          previously visualized. Nasal bone previously visualized.
Cervix Uterus Adnexa

 Cervix
 Not visualized (advanced GA >50wks)
Impression

 Normal interval growth.
 BPP [DATE]
Recommendations

 Follow up weekly testing given T2DM previously scheduled.

## 2019-11-08 ENCOUNTER — Other Ambulatory Visit (HOSPITAL_COMMUNITY)
Admission: RE | Admit: 2019-11-08 | Discharge: 2019-11-08 | Disposition: A | Discharge status: Home / Self Care | Payer: Medicaid Other | Source: Ambulatory Visit | Attending: Family Medicine | Admitting: Family Medicine

## 2019-11-08 ENCOUNTER — Ambulatory Visit (INDEPENDENT_AMBULATORY_CARE_PROVIDER_SITE_OTHER): Payer: Medicaid Other | Admitting: Family Medicine

## 2019-11-08 ENCOUNTER — Encounter: Payer: Self-pay | Admitting: Family Medicine

## 2019-11-08 ENCOUNTER — Other Ambulatory Visit: Payer: Self-pay

## 2019-11-08 VITALS — BP 118/78 | HR 100 | Ht 64.0 in | Wt 206.0 lb

## 2019-11-08 DIAGNOSIS — F332 Major depressive disorder, recurrent severe without psychotic features: Secondary | ICD-10-CM

## 2019-11-08 DIAGNOSIS — E1165 Type 2 diabetes mellitus with hyperglycemia: Secondary | ICD-10-CM

## 2019-11-08 LAB — POCT URINALYSIS DIP (MANUAL ENTRY)
Bilirubin, UA: NEGATIVE
Glucose, UA: 100 mg/dL — AB
Ketones, POC UA: NEGATIVE mg/dL
Leukocytes, UA: NEGATIVE
Nitrite, UA: NEGATIVE
Protein Ur, POC: 30 mg/dL — AB
Spec Grav, UA: >=1.03 — AB (ref 1.010–1.025)
Urobilinogen, UA: 0.2 E.U./dL
pH, UA: 5.5 (ref 5.0–8.0)

## 2019-11-08 LAB — POCT UA - MICROALBUMIN
Creatinine, POC: 200 mg/dL
Microalbumin Ur, POC: 150 mg/L

## 2019-11-08 LAB — POCT UA - MICROSCOPIC ONLY

## 2019-11-08 LAB — POCT GLYCOSYLATED HEMOGLOBIN (HGB A1C): HbA1c, POC (controlled diabetic range): 5.6 % (ref 0.0–7.0)

## 2019-11-08 NOTE — Patient Instructions (Addendum)
Your blood sugar is under great control. Keep taking your metformin and jardiance.  I want you to think about getting the Pneumonia vaccination at our next office visit in 3 months.   Dr Stephaie Dardis will call you with your test results.   Try Excedrin Migraine tablets for migraine headaches.  It contains caffeine.  We will figure out how to get you glucometer.

## 2019-11-09 ENCOUNTER — Encounter: Payer: Self-pay | Admitting: Family Medicine

## 2019-11-09 LAB — CERVICOVAGINAL ANCILLARY ONLY
Bacterial Vaginitis (gardnerella): POSITIVE — AB
Candida Glabrata: NEGATIVE
Candida Vaginitis: NEGATIVE
Chlamydia: NEGATIVE
Comment: NEGATIVE
Comment: NEGATIVE
Comment: NEGATIVE
Comment: NEGATIVE
Comment: NEGATIVE
Comment: NORMAL
Neisseria Gonorrhea: NEGATIVE
Trichomonas: NEGATIVE

## 2019-11-13 ENCOUNTER — Encounter: Payer: Self-pay | Admitting: Family Medicine

## 2019-11-13 NOTE — Assessment & Plan Note (Signed)
Established problem. PHQ9 = 8. No SI/HI. GAD7 = 6, not difficult.  Patient is working outside home.  In counseling with [facility].  Not taking Rx Citalopram nor hydroxyzine.  Stable. Continue counseling

## 2019-11-13 NOTE — Addendum Note (Signed)
Addended byLissa Morales D on: 11/13/2019 09:47 AM   Modules accepted: Orders

## 2019-11-13 NOTE — Assessment & Plan Note (Signed)
Lab Results  Component Value Date   HGBA1C 5.6 11/08/2019  Excellent glycemic control. Will continue current regiment Jardiance and Metformin. Can discuss reducing ADM next ov in 3 months.

## 2019-11-13 NOTE — Progress Notes (Signed)
Grace [place] is alone Sources of clinical information for visit is/are patient. Nursing assessment for this office visit was reviewed with the patient for accuracy and revision.     Previous Report(s) Reviewed: lab reports and office notes  Depression screen [facility] 2/9 11/08/2019  Decreased Interest 0  Down, Depressed, Hopeless 1  PHQ - 2 Score 1  Altered sleeping 2  Tired, decreased energy 2  Change in appetite 2  Feeling bad or failure about yourself  1  Trouble concentrating 0  Moving slowly or fidgety/restless 0  Suicidal thoughts 0  PHQ-9 Score 8  Difficult doing work/chores Somewhat difficult   GAD 7 : Generalized Anxiety Score 11/08/2019  Nervous, Anxious, on Edge 1  Control/stop worrying 1  Worry too much - different things 1  Trouble relaxing 1  Restless 1  Easily annoyed or irritable 1  Afraid - awful might happen 0  Total GAD 7 Score 6  Anxiety Difficulty Not difficult at all      Fall Risk  04/11/2018 03/13/2018 02/13/2018 01/26/2018  Falls in the past year? 0 0 0 0    PHQ9 SCORE ONLY 11/08/2019 02/14/2019 09/21/2018  PHQ-9 Total Score 8 0 0    Adult vaccines due  Topic Date Due  . PNEUMOCOCCAL POLYSACCHARIDE VACCINE AGE 62-64 HIGH RISK  02/08/2020 (Originally 07/21/1992)  . TETANUS/TDAP  04/19/2028    Health Maintenance Due  Topic Date Due  . Hepatitis C Screening  Never done  . OPHTHALMOLOGY EXAM  Never done      History/P.E. limitations: none  Adult vaccines due  Topic Date Due  . PNEUMOCOCCAL POLYSACCHARIDE VACCINE AGE 62-64 HIGH RISK  02/08/2020 (Originally 07/21/1991)  . TETANUS/TDAP  04/19/2028    Diabetes Health Maintenance Due  Topic Date Due  . OPHTHALMOLOGY EXAM  Never done  . HEMOGLOBIN A1C  05/07/2020  . FOOT EXAM  11/07/2020  . URINE MICROALBUMIN  11/07/2020    Health Maintenance Due  Topic Date Due  . Hepatitis C Screening  Never done  . OPHTHALMOLOGY EXAM  Never done     Chief Complaint  Patient presents with  . Annual Exam  .  Diabetes

## 2019-11-15 ENCOUNTER — Encounter: Payer: Self-pay | Admitting: Family Medicine

## 2019-11-15 ENCOUNTER — Other Ambulatory Visit: Payer: Self-pay | Admitting: Family Medicine

## 2019-11-15 DIAGNOSIS — N760 Acute vaginitis: Secondary | ICD-10-CM

## 2019-11-15 MED ORDER — METRONIDAZOLE 500 MG PO TABS: 500.0000 mg | ORAL_TABLET | Freq: Two times a day (BID) | ORAL | 0 refills | Status: AC

## 2019-11-15 NOTE — Progress Notes (Unsigned)
Dx BV on NAA vag prep Tx Flagyl 500 mg BID x 7 days

## 2019-11-23 IMAGING — US US MFM FETAL BPP WO NON STRESS
1 series · 15 of 27 positions shown · non-contrast
Comparison: none

[Series 1: us mfm fetal bpp wo non stress · 27 acquisitions, 15 frames shown]
[im 1/27]
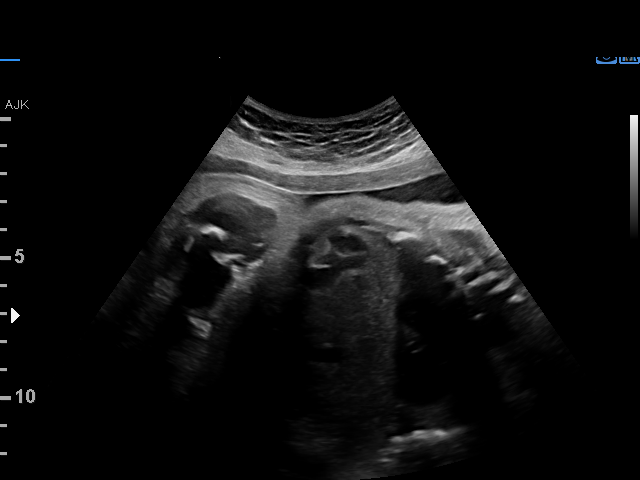
[im 3/27]
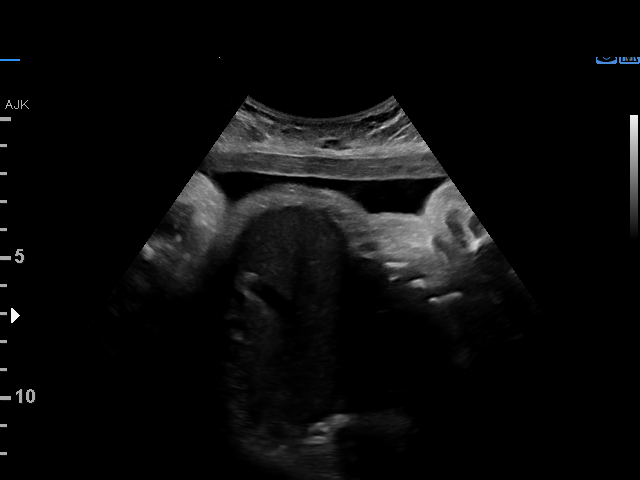
[im 5/27]
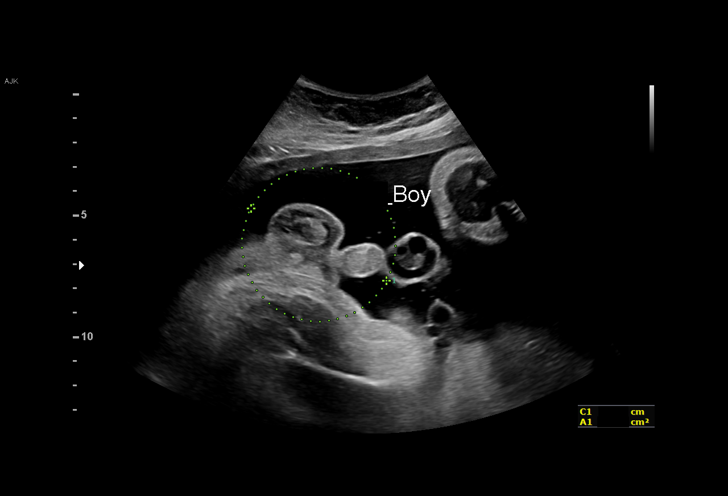
[im 7/27]
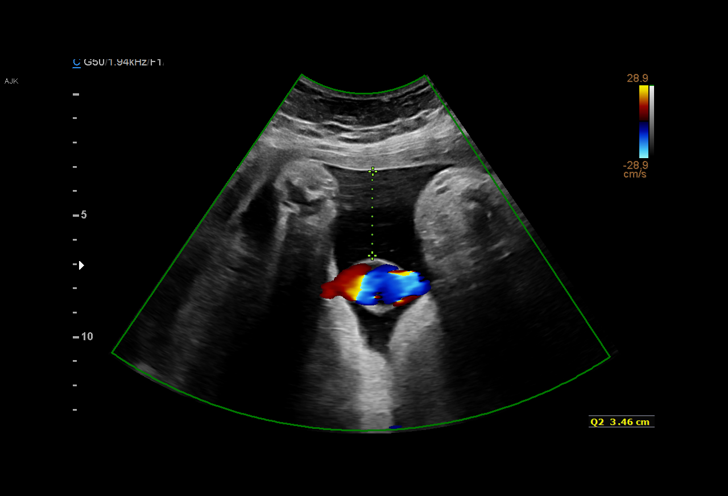
[im 9/27]
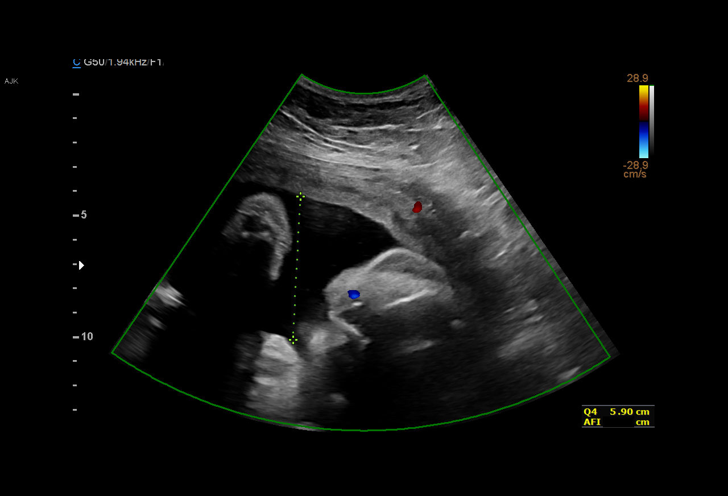
[im 10/27]
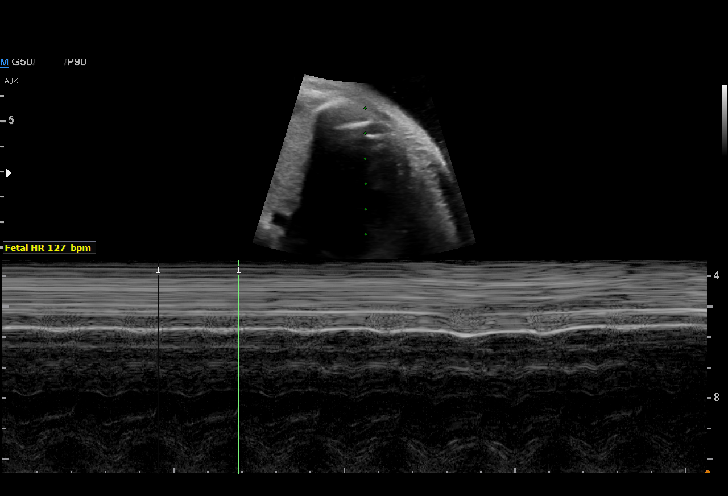
[im 12/27]
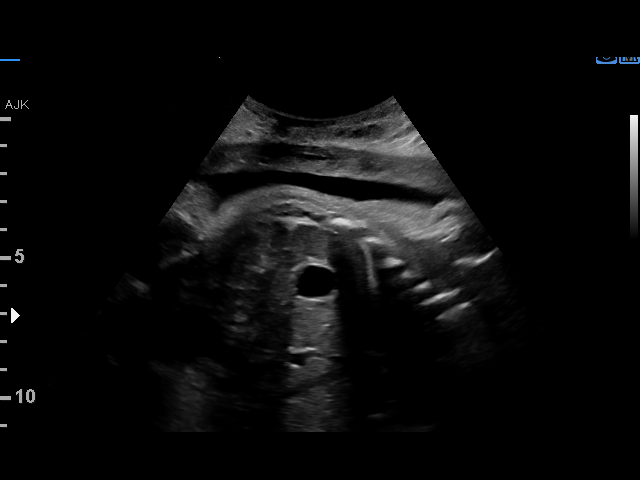
[im 14/27]
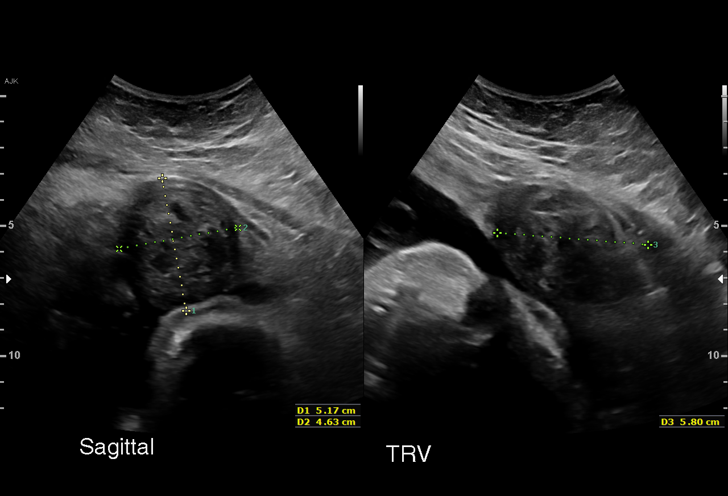
[im 16/27]
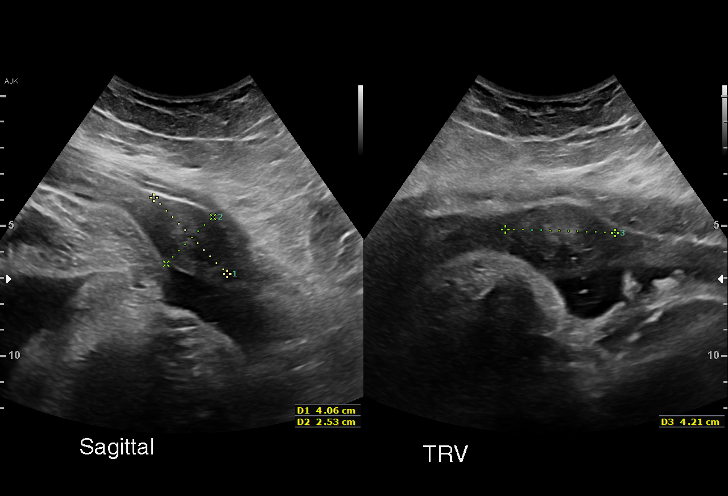
[im 18/27]
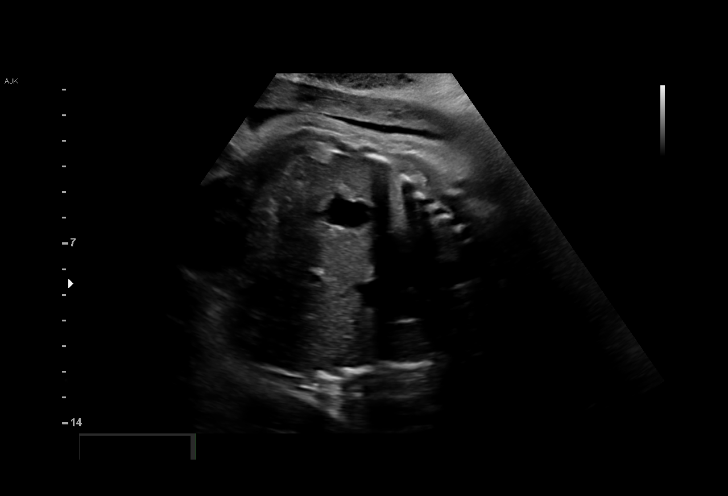
[im 19/27]
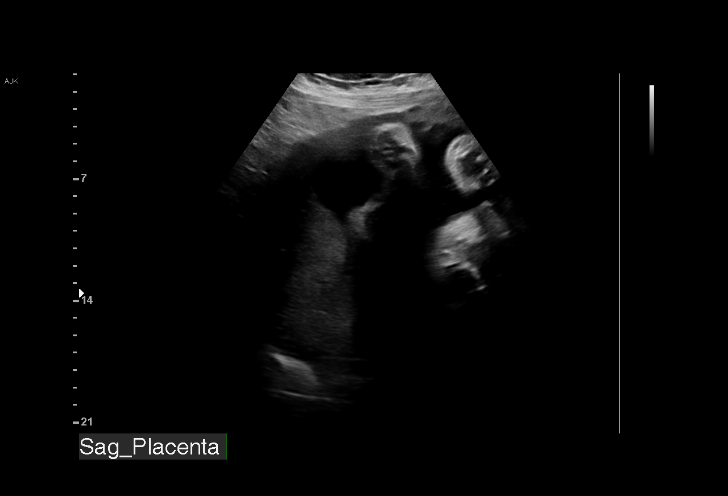
[im 21/27]
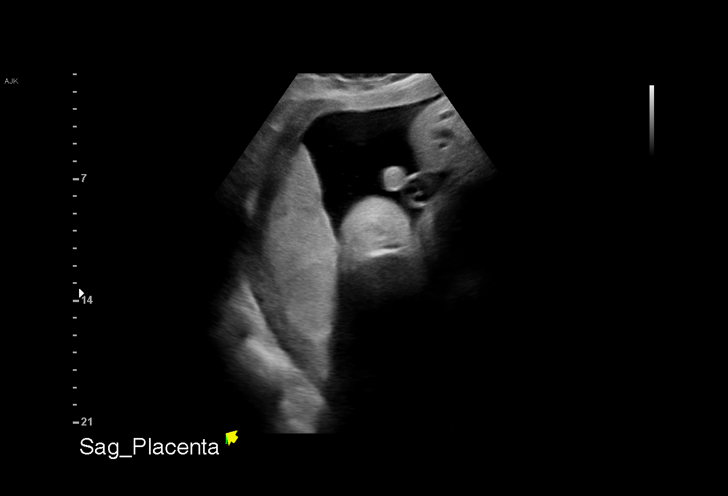
[im 23/27]
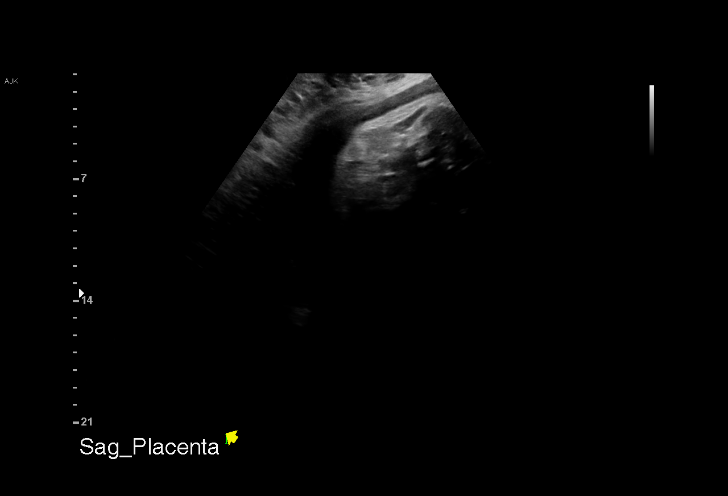
[im 25/27]
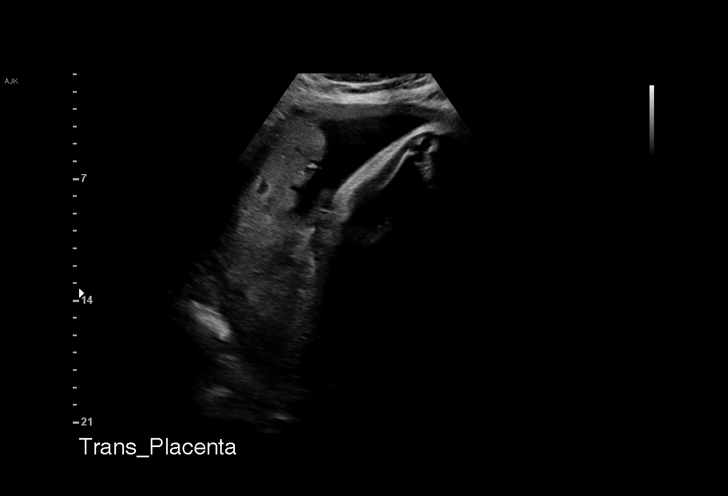
[im 27/27]
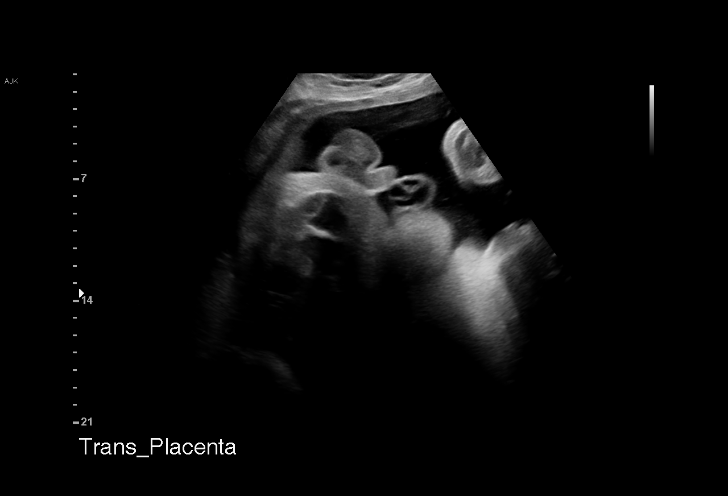

[15 of 27 positions shown; findings below may reference images not displayed]

----------------------------------------------------------------------

 ----------------------------------------------------------------------
Indications

  Poor obstetric history (prior pre-term delivery)TON.LPN
  (36w x2--Induced for poor glycemic control)
  Asthma (Inhaler)                                W55.25 j19.818
  Poor obstetric history: Previous gestational
  diabetes
  Obesity complicating pregnancy, third trimester
  Pre-existing diabetes, type 2, in pregnancy,
  third trimester
  Smoking complicating pregnancy, third
  trimester
  Maternal morbid obesity
  35 weeks gestation of pregnancy
 ----------------------------------------------------------------------
Vital Signs

                                                 Height:        5'3"
Fetal Evaluation

 Num Of Fetuses:          1
 Fetal Heart Rate(bpm):   127
 Cardiac Activity:        Observed
 Presentation:            Cephalic
 Placenta:                Posterior

 Amniotic Fluid
 AFI FV:      Subjectively upper-normal

 AFI Sum(cm)     %Tile       Largest Pocket(cm)
 19.68           74
 RUQ(cm)       RLQ(cm)        LUQ(cm)        LLQ(cm)

Biophysical Evaluation

 Amniotic F.V:   Within normal limits        F. Tone:         Observed
 F. Movement:    Observed                    Score:           [DATE]
 F. Breathing:   Observed
OB History

 Gravidity:     4         Term:  1          Prem:  2
 Living:        2
Gestational Age

 LMP:            35w 1d       Date:  10/02/17                   EDD:  07/09/18
 Best:           35w 1d    Det. By:  LMP  (10/02/17)            EDD:  07/09/18
Impression

 Biophysical profile [DATE]
Recommendations

 Follow up growth in 4-5 weeks
 Continue weekly BPP's.

## 2019-11-30 IMAGING — US US MFM OB FOLLOW UP
1 series · 14 of 28 positions shown · non-contrast
Comparison: none

[Series 1: us mfm ob follow up · 37 acquisitions, 14 frames shown]
[im 2/37]
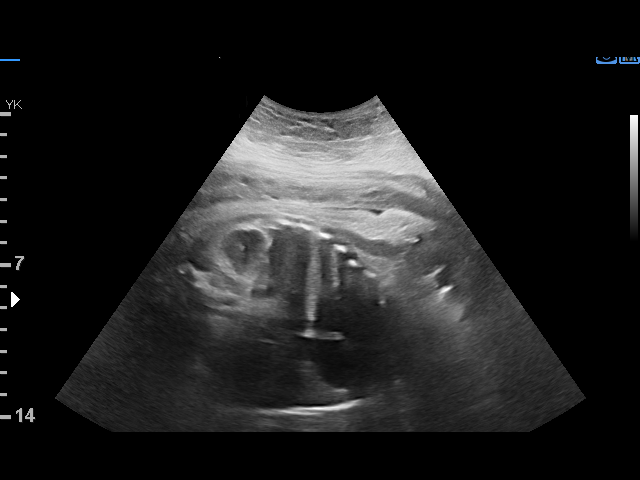
[im 5/37]
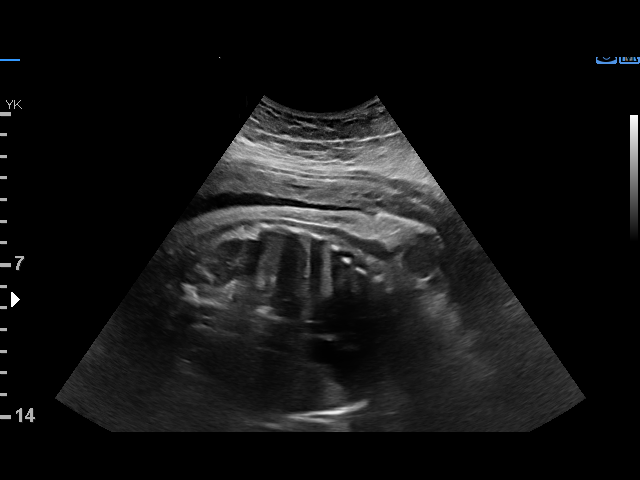
[im 7/37]
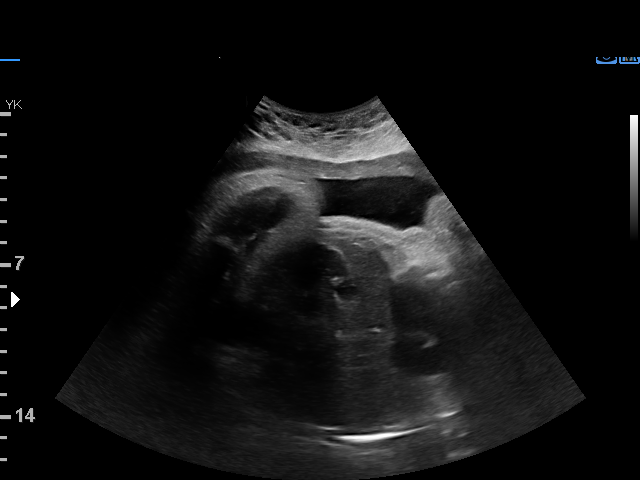
[im 10/37]
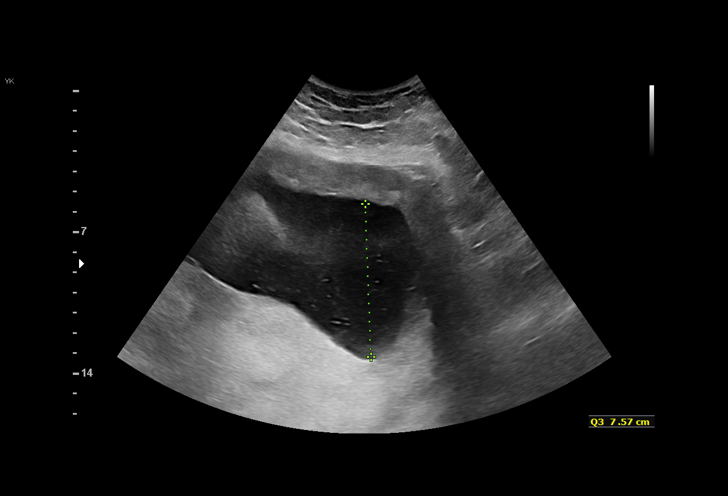
[im 13/37]
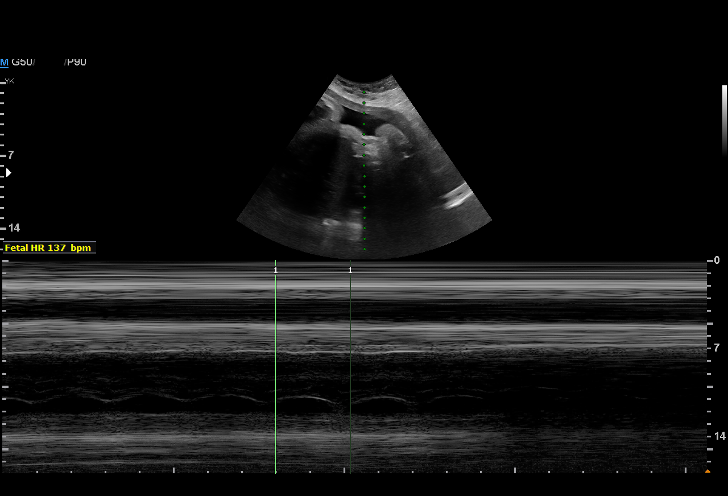
[im 15/37]
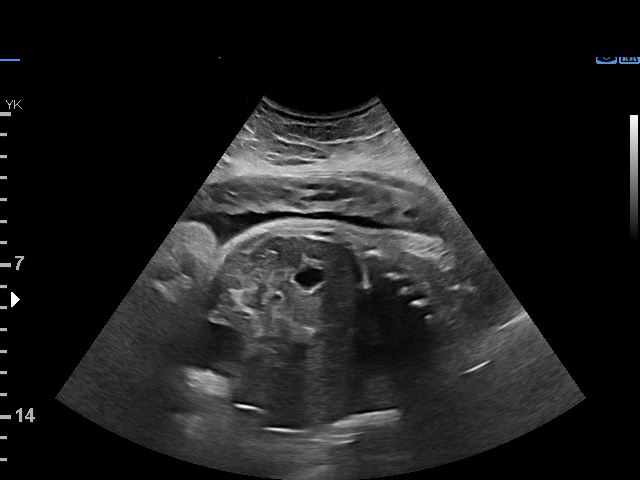
[im 18/37]
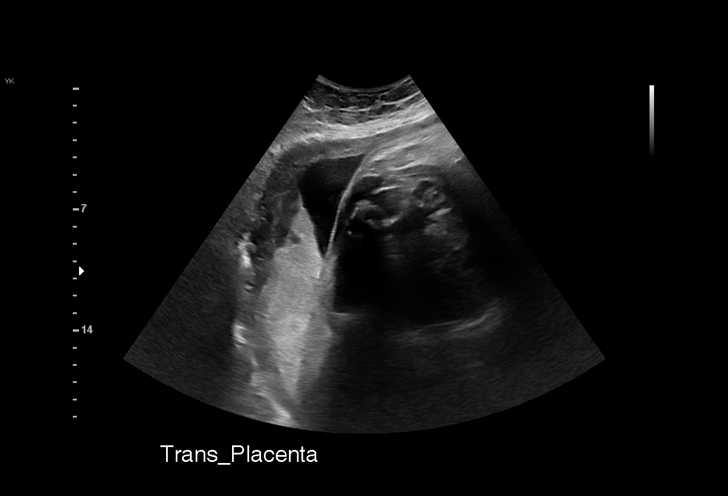
[im 21/37]
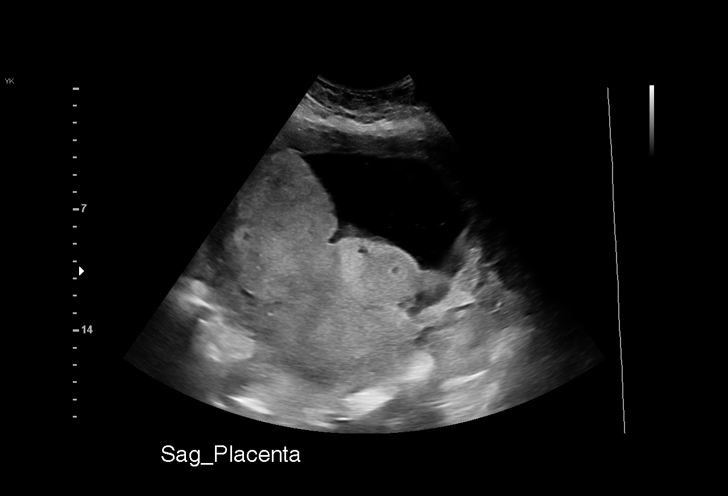
[im 23/37]
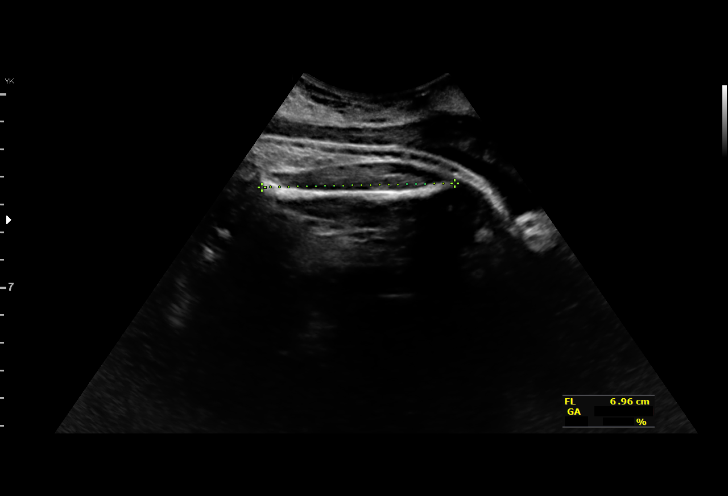
[im 26/37]
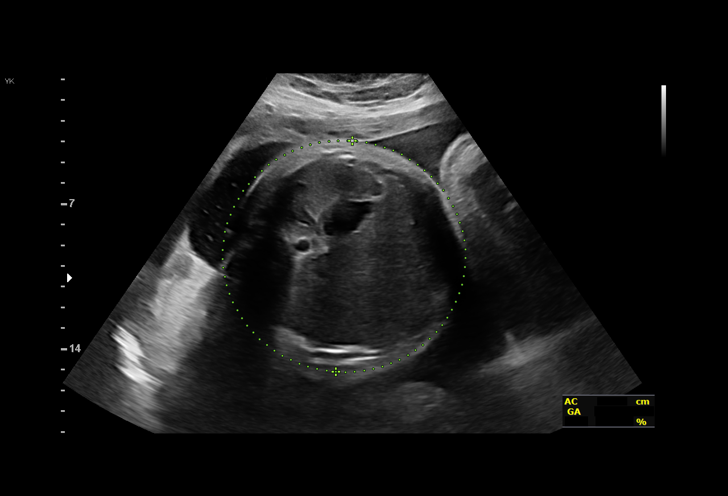
[im 29/37]
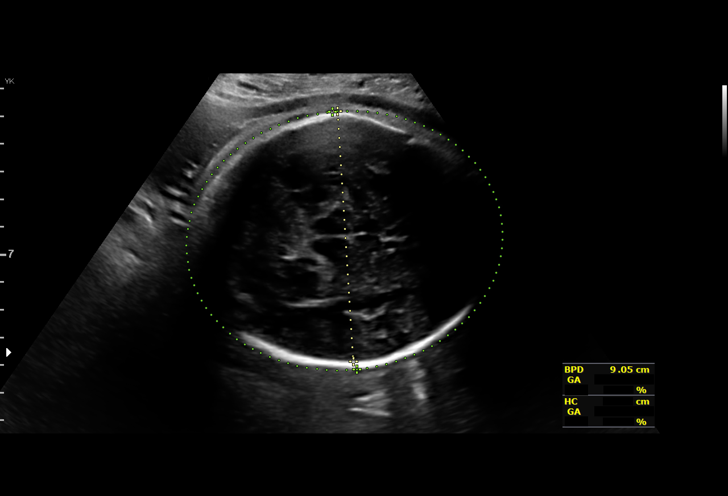
[im 31/37]
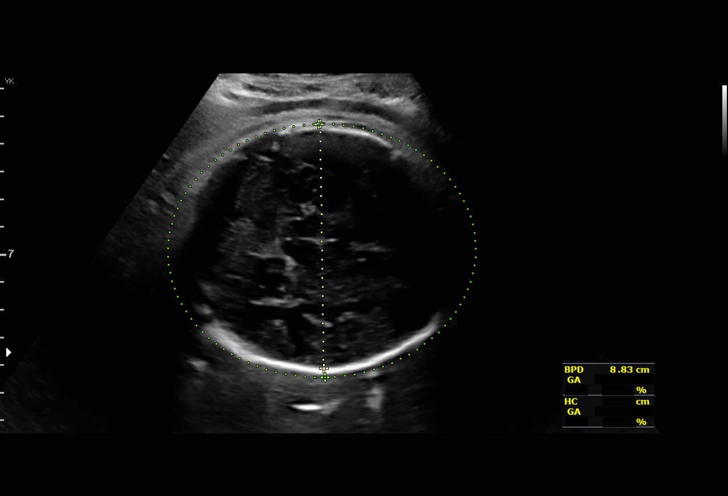
[im 34/37]
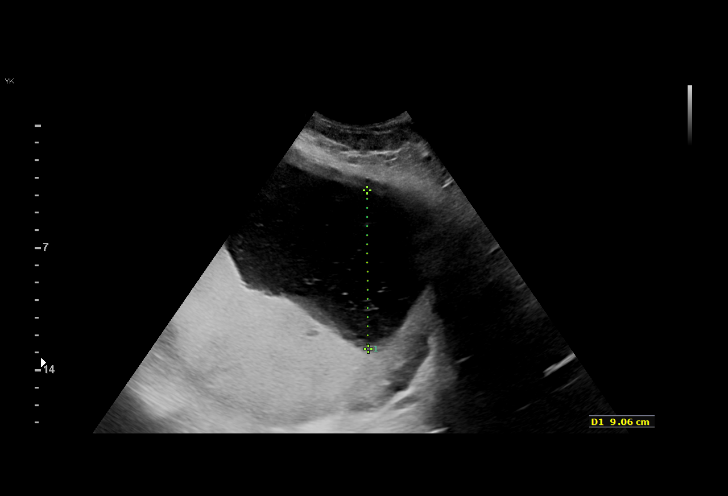
[im 37/37]
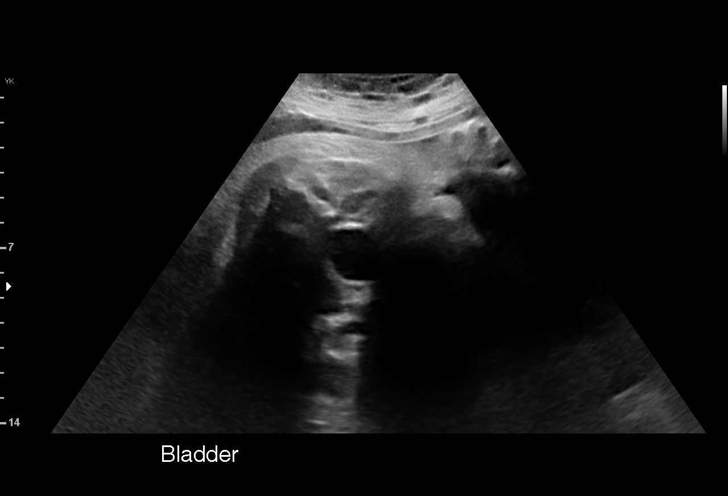

[14 of 28 positions shown; findings below may reference images not displayed]

----------------------------------------------------------------------

 ----------------------------------------------------------------------
Indications

  Poor obstetric history (prior pre-term
  delivery) (36w x2--Induced for poor glycemic
  control)
  Asthma (Inhaler)                               244.64 j09.141
  Poor obstetric history: Previous gestational
  diabetes
  Obesity complicating pregnancy, third
  trimester
  Pre-existing diabetes, type 2, in pregnancy,
  third trimester
  Smoking complicating pregnancy, third
  trimester
  Maternal morbid obesity
  36 weeks gestation of pregnancy
 ----------------------------------------------------------------------
Vital Signs

                                                Height:        5'3"
Fetal Evaluation

 Num Of Fetuses:         1
 Fetal Heart Rate(bpm):  144
 Cardiac Activity:       Observed
 Presentation:           Cephalic
 Placenta:               Posterior right  Fundal

 Amniotic Fluid
 AFI FV:      Polyhydramnios

 AFI Sum(cm)     %Tile       Largest Pocket(cm)
 29.26           > 97
 RUQ(cm)       RLQ(cm)       LUQ(cm)        LLQ(cm)

Biophysical Evaluation

 Amniotic F.V:   Pocket => 2 cm two         F. Tone:        Observed
                 planes
 F. Movement:    Observed                   Score:          [DATE]
 F. Breathing:   Observed
Biometry

 BPD:      89.7  mm     G. Age:  36w 2d         65  %    CI:        75.97   %    70 - 86
                                                         FL/HC:      21.2   %    20.1 -
 HC:      326.2  mm     G. Age:  37w 0d         41  %    HC/AC:      0.91        0.93 -
 AC:      358.2  mm     G. Age:  39w 5d       > 97  %    FL/BPD:     76.9   %    71 - 87
 FL:         69  mm     G. Age:  35w 3d         28  %    FL/AC:      19.3   %    20 - 24

 Est. FW:    2216  gm      7 lb 7 oz   > 90  %
OB History

 Gravidity:    4         Term:   1        Prem:   2
 Living:       2
Gestational Age

 LMP:           36w 1d        Date:  10/02/17                 EDD:   07/09/18
 U/S Today:     37w 1d                                        EDD:   07/02/18
 Best:          36w 1d     Det. By:  LMP  (10/02/17)          EDD:   07/09/18
Anatomy

 Stomach:               Appears normal, left   Bladder:                Appears normal
                        sided
Cervix Uterus Adnexa

 Cervix
 Not adaquately visualized
Impression

 Pregestational diabetes. Patient has not been checking her
 blood glucose.
 On ultrasound, the estimated fetal weight is at greater than
 the 90th percentile. Abdominal circumference measures at
 greater than the 95th percentile. Polyhydramnios is seen.
 Antenatal testing is reassuring. BPP [DATE]. Cephalic
 presentation.

 I discussed the findings and limitations of ultrasound in
 accurately-estimating fetal weights. I counseled her that both
 polyhydramnios and large-for-gestational age fetus reflects
 poor diabetic control. I discussed the complications of poor
 control including stillbirth. Antenatal testing does not always
 predict fetal compromise.
Recommendations

 -I recommend delivery at 37 weeks.
 -Patient has an appointment at your office tomorrow.
 Recommend scheduling induction of labor.
                 Giordano, Mohinder

## 2019-12-13 ENCOUNTER — Other Ambulatory Visit: Payer: Self-pay

## 2019-12-13 ENCOUNTER — Ambulatory Visit (HOSPITAL_COMMUNITY)
Admission: EM | Admit: 2019-12-13 | Discharge: 2019-12-13 | Disposition: A | Discharge status: Home / Self Care | Payer: Medicaid Other | Attending: Family Medicine | Admitting: Family Medicine

## 2019-12-13 ENCOUNTER — Encounter (HOSPITAL_COMMUNITY): Payer: Self-pay

## 2019-12-13 DIAGNOSIS — Z202 Contact with and (suspected) exposure to infections with a predominantly sexual mode of transmission: Secondary | ICD-10-CM | POA: Insufficient documentation

## 2019-12-13 DIAGNOSIS — Z1152 Encounter for screening for COVID-19: Secondary | ICD-10-CM | POA: Insufficient documentation

## 2019-12-13 LAB — SARS CORONAVIRUS 2 (TAT 6-24 HRS): SARS Coronavirus 2: NEGATIVE

## 2019-12-13 MED ORDER — METRONIDAZOLE 500 MG PO TABS
2000.0000 mg | ORAL_TABLET | Freq: Once | ORAL | Status: AC
  Administered 2019-12-13: 2000 mg via ORAL

## 2019-12-13 MED ORDER — METRONIDAZOLE 500 MG PO TABS
ORAL_TABLET | ORAL | Status: AC
  Filled 2019-12-13: qty 4

## 2019-12-13 NOTE — ED Triage Notes (Signed)
Pt presents for covid testing with no known symptoms.  Pt also presents for STD Testing.
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
## 2019-12-13 NOTE — Discharge Instructions (Signed)
Check my chart for test results

## 2019-12-14 LAB — CERVICOVAGINAL ANCILLARY ONLY
Chlamydia: NEGATIVE
Comment: NEGATIVE
Comment: NEGATIVE
Comment: NORMAL
Neisseria Gonorrhea: NEGATIVE
Trichomonas: NEGATIVE

## 2020-01-13 ENCOUNTER — Encounter (HOSPITAL_COMMUNITY): Payer: Self-pay

## 2020-01-13 ENCOUNTER — Emergency Department (HOSPITAL_COMMUNITY)
Admission: EM | Admit: 2020-01-13 | Discharge: 2020-01-14 | Disposition: A | Discharge status: Home / Self Care | Payer: Medicaid Other | Attending: Emergency Medicine | Admitting: Emergency Medicine

## 2020-01-13 DIAGNOSIS — R519 Headache, unspecified: Secondary | ICD-10-CM | POA: Insufficient documentation

## 2020-01-13 DIAGNOSIS — Z5321 Procedure and treatment not carried out due to patient leaving prior to being seen by health care provider: Secondary | ICD-10-CM | POA: Insufficient documentation

## 2020-01-13 DIAGNOSIS — M542 Cervicalgia: Secondary | ICD-10-CM | POA: Insufficient documentation

## 2020-01-13 DIAGNOSIS — R220 Localized swelling, mass and lump, head: Secondary | ICD-10-CM | POA: Insufficient documentation

## 2020-01-13 NOTE — ED Notes (Signed)
Patient stated "I'm just going to go to urgent care in the morning." LWFS.
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
## 2020-01-14 ENCOUNTER — Other Ambulatory Visit: Payer: Self-pay | Admitting: Family Medicine

## 2020-01-14 DIAGNOSIS — E1169 Type 2 diabetes mellitus with other specified complication: Secondary | ICD-10-CM

## 2020-01-14 DIAGNOSIS — E1165 Type 2 diabetes mellitus with hyperglycemia: Secondary | ICD-10-CM

## 2020-01-17 ENCOUNTER — Ambulatory Visit (HOSPITAL_COMMUNITY)
Admission: EM | Admit: 2020-01-17 | Discharge: 2020-01-17 | Disposition: A | Discharge status: Home / Self Care | Payer: Medicaid Other | Attending: Family Medicine | Admitting: Family Medicine

## 2020-01-17 ENCOUNTER — Encounter (HOSPITAL_COMMUNITY): Payer: Self-pay

## 2020-01-17 ENCOUNTER — Other Ambulatory Visit: Payer: Self-pay

## 2020-01-17 DIAGNOSIS — N898 Other specified noninflammatory disorders of vagina: Secondary | ICD-10-CM | POA: Diagnosis present

## 2020-01-17 MED ORDER — METRONIDAZOLE 500 MG PO TABS: 500.0000 mg | ORAL_TABLET | Freq: Two times a day (BID) | ORAL | 0 refills | Status: DC

## 2020-01-17 MED ORDER — FLUCONAZOLE 150 MG PO TABS: ORAL_TABLET | ORAL | 0 refills | Status: DC

## 2020-01-17 NOTE — Discharge Instructions (Addendum)
We have sent testing for sexually transmitted infections. We will notify you of any positive results once they are received. If required, we will prescribe any medications you might need.  Please refrain from all sexual activity for at least the next seven days.  

## 2020-01-17 NOTE — ED Provider Notes (Signed)
[place]   [id number] 01/17/20 Arrival Time: 9833  ASSESSMENT & PLAN:  1. Vaginal itching   2. Vaginal discharge     Empiric tx for BV and yeast. Vaginal cytology pending.  Meds ordered this encounter  Medications  . fluconazole (DIFLUCAN) 150 MG tablet    Sig: Take one tablet by mouth as a single dose. May repeat in 3 days if symptoms persist.    Dispense:  2 tablet    Refill:  0  . metroNIDAZOLE (FLAGYL) 500 MG tablet    Sig: Take 1 tablet (500 mg total) by mouth 2 (two) times daily.    Dispense:  14 tablet    Refill:  0      Discharge Instructions     We have sent testing for sexually transmitted infections. We will notify you of any positive results once they are received. If required, we will prescribe any medications you might need.  Please refrain from all sexual activity for at least the next seven days.     Without s/s of PID.  Labs Reviewed  CERVICOVAGINAL ANCILLARY ONLY     Will notify of any positive results. Instructed to refrain from sexual activity for at least seven days.  Reviewed expectations re: course of current medical issues. Questions answered. Outlined signs and symptoms indicating need for more acute intervention. Patient verbalized understanding. After Visit Summary given.   SUBJECTIVE:  Grace Montgomery is a 29 y.o. female who presents with complaint of vaginal discharge and itching; several day. Describes discharge as thin and clear; with fishy odor; but with occasional "clumping". No specific aggravating or alleviating factors reported. Denies: urinary frequency, dysuria and gross hematuria. Afebrile. No abdominal or pelvic pain. Normal PO intake wihout n/v. No genital rashes or lesions. Reports that she is sexually active; little concern for STD but agrees to testing. OTC treatment: Monistat cream with a little relief.  Patient's last menstrual period was 01/09/2020 (approximate).   OBJECTIVE:  Vitals:    01/17/20 1817  BP: (!) 142/80  Pulse: 94  Resp: 20  SpO2: 98%     General appearance: alert, cooperative, appears stated age and no distress Lungs: unlabored respirations; speaks full sentences without difficulty Back: no CVA tenderness reported; FROM at waist GU: deferred Skin: warm and dry Psychological: alert and cooperative; normal mood and affect.    Labs Reviewed  CERVICOVAGINAL ANCILLARY ONLY    Allergies  Allergen Reactions  . Monistat [Tioconazole] Swelling  . Chocolate Itching and Other (See Comments)    Ears & throat itch  . Ibuprofen Swelling    Swelling of the feet  . Banana Itching and Other (See Comments)    Throat itching    Past Medical History:  Diagnosis Date  . Acute post-traumatic headache 06/09/2019   See 06/09/19 ED visit note  . Anxiety   . Asthma, mild persistent 06/29/2011  . Bipolar affective disorder ([place]) 09/22/2018  . Cannabis dependence   . Carpal tunnel syndrome of left wrist 02/16/2019  . Concussion 06/09/2019   See ED visit note 06/09/19  . Diabetes mellitus without complication ([place])   . 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 Marijuana use 10/26/2018  . Maternal morbid obesity, 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[place]) 06/29/2011  . 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 Rh negative state in antepartum period 03/02/2018   Will need rho gam  . S/P emergency cesarean section 06/18/2018  . Seasonal allergies 06/29/2011  On zyrtec OTC    . Suicidal behavior    2013, 2018  . Supervision of high risk pregnancy, antepartum 01/12/2018    Nursing Staff Provider Office Location  [facility] Dating  [place] and 9.1 week [place] 12/08/18 Language  english Anatomy [place]   02/13/18 Flu Vaccine  Declined 01/12/18 Genetic Screen  NIPS: low risk  AFP: neg   TDaP vaccine   04/19/2018 Hgb A1C or  GTT Early  Third trimester  Rhogam  04/19/2018   LAB RESULTS  Feeding Plan both Blood Type --/--/A NEG (03/13 0947) A NEG   Contraception  condoms Antibody POS (03  . Tobacco use 09/18/2018  . Type 2 diabetes mellitus complicating pregnancy in third trimester, antepartum 01/05/2018   Current Diabetic Medications:  Insulin  [x]  Aspirin 81 mg daily after 12 weeks (? A2/B GDM)  For A2/B GDM or higher classes of DM [x]  Diabetes Education and Testing Supplies [x]  Nutrition Counsult [ ]  Fetal ECHO after 20 weeks - ordered 12/26 [ ]  Eye exam for retina evaluation   Baseline and surveillance labs (pulled in from EPIC, refresh links as needed)  Lab Results Component Value Date  CREATIN   Family History  Problem Relation Age of Onset  . Sickle cell anemia Mother   . Depression Mother   . Diabetes Mother   . Asthma Mother   . Diabetes Other        grandparents and aunts/uncles  . Stroke Other        grandparent  . Alcohol abuse Father   . Depression Father   . Alcohol abuse Brother   . Drug abuse Brother    Social History   Socioeconomic History  . Marital status: Single    Spouse name: Not on file  . Number of children: 4  . Years of education: 44  . Highest education level: 11th grade  Occupational History  . Occupation: [employer]  Tobacco Use  . Smoking status: Current Some Day Smoker    Packs/day: 0.25    Types: Cigarettes  . Smokeless tobacco: Never Used  Vaping Use  . Vaping Use: Never used  Substance and Sexual Activity  . Alcohol use: Not Currently    Comment: ocassionally  . Drug use: Not Currently    Comment: History of Marijuana - a few ago from  February 16, 2019  . Sexual activity: Not Currently    Partners: Male    Birth control/protection: None  Other Topics Concern  . Not on file  Social History Narrative   09/2018 Living independently in subsidized apartment    FOB lives nearby to patient.  He helps some with child care.      Patient has 3 Children    Friend assists with transportation   Grace Montgomery would make decisions for pt if she were unable   Grace Montgomery reports feeling safe  in her relationships   Social Determinants of Health   Financial Resource Strain:   . Difficulty of Paying Living Expenses: Not on file  Food Insecurity:   . Worried About Charity fundraiser in the Last Year: Not on file  . Ran Out of Food in the Last Year: Not on file  Transportation Needs:   . Lack of Transportation (Medical): Not on file  . Lack of Transportation (Non-Medical): Not on file  Physical Activity:   . Days of Exercise per Week: Not on file  . Minutes of Exercise per Session: Not on file  Stress:   .  Feeling of Stress : Not on file  Social Connections:   . Frequency of Communication with Friends and Family: Not on file  . Frequency of Social Gatherings with Friends and Family: Not on file  . Attends Religious Services: Not on file  . Active Member of Clubs or Organizations: Not on file  . Attends Archivist Meetings: Not on file  . Marital Status: Not on file  Intimate Partner Violence:   . Fear of Current or Ex-Partner: Not on file  . Emotionally Abused: Not on file  . Physically Abused: Not on file  . Sexually Abused: Not on file          Vanessa Kick, MD 01/17/20 [phone]

## 2020-01-17 NOTE — ED Triage Notes (Signed)
Pt in with c/o vaginal itching that has been going on for 3 days. States that her labia and clitoris are itching and sensitive and looks like it's a rash.  Pt applied yeast infection cream with some relief

## 2020-01-18 LAB — CERVICOVAGINAL ANCILLARY ONLY
Bacterial Vaginitis (gardnerella): POSITIVE — AB
Candida Glabrata: NEGATIVE
Candida Vaginitis: NEGATIVE
Chlamydia: NEGATIVE
Comment: NEGATIVE
Comment: NEGATIVE
Comment: NEGATIVE
Comment: NEGATIVE
Comment: NEGATIVE
Comment: NORMAL
Neisseria Gonorrhea: NEGATIVE
Trichomonas: NEGATIVE

## 2020-02-26 ENCOUNTER — Other Ambulatory Visit: Payer: Self-pay

## 2020-02-26 ENCOUNTER — Ambulatory Visit (HOSPITAL_COMMUNITY)
Admission: EM | Admit: 2020-02-26 | Discharge: 2020-02-26 | Disposition: A | Discharge status: Home / Self Care | Payer: Medicaid Other | Attending: Student | Admitting: Student

## 2020-02-26 ENCOUNTER — Encounter (HOSPITAL_COMMUNITY): Payer: Self-pay | Admitting: Emergency Medicine

## 2020-02-26 DIAGNOSIS — Z3202 Encounter for pregnancy test, result negative: Secondary | ICD-10-CM

## 2020-02-26 DIAGNOSIS — B9689 Other specified bacterial agents as the cause of diseases classified elsewhere: Secondary | ICD-10-CM | POA: Diagnosis not present

## 2020-02-26 DIAGNOSIS — N898 Other specified noninflammatory disorders of vagina: Secondary | ICD-10-CM | POA: Diagnosis not present

## 2020-02-26 DIAGNOSIS — N760 Acute vaginitis: Secondary | ICD-10-CM | POA: Insufficient documentation

## 2020-02-26 DIAGNOSIS — K5904 Chronic idiopathic constipation: Secondary | ICD-10-CM | POA: Diagnosis present

## 2020-02-26 LAB — POCT URINALYSIS DIPSTICK, ED / UC
Bilirubin Urine: NEGATIVE
Glucose, UA: NEGATIVE mg/dL
Ketones, ur: NEGATIVE mg/dL
Nitrite: NEGATIVE
Protein, ur: NEGATIVE mg/dL
Specific Gravity, Urine: 1.025 (ref 1.005–1.030)
Urobilinogen, UA: 0.2 mg/dL (ref 0.0–1.0)
pH: 5.5 (ref 5.0–8.0)

## 2020-02-26 LAB — POC URINE PREG, ED: Preg Test, Ur: NEGATIVE

## 2020-02-26 LAB — HIV ANTIBODY (ROUTINE TESTING W REFLEX): HIV Screen 4th Generation wRfx: NONREACTIVE

## 2020-02-26 MED ORDER — METRONIDAZOLE 500 MG PO TABS: 500.0000 mg | ORAL_TABLET | Freq: Two times a day (BID) | ORAL | 0 refills | Status: DC

## 2020-02-26 NOTE — Discharge Instructions (Signed)
Start the Flagyl. This will treat your BV. (It's also the treatment for Trichomonas, in case you are positive for this). Make sure not to drink alcohol while on this medication! We'll call you with the results of your labwork if anything is positive.   Follow-up with your primary care provider in 1 week to discuss constipation.

## 2020-02-26 NOTE — ED Triage Notes (Addendum)
Pt presents with vaginal itching and abdominal cramping xs 2 days. Denies any discharge.

## 2020-02-26 NOTE — ED Provider Notes (Signed)
[place] [facility]    CSN: [id number] Arrival date & time: 02/26/20  [id number]      History   Chief Complaint Chief Complaint  Patient presents with  . Vaginal Itching    HPI TASHEE WIER is a 29 y.o. female presenting with vaginal itching x2 days. History of diabetes type II. States she's had few days of clear vaginal discharge and itching, which got worse after her period ended. Also endorses abd cramping intermittently last week, which she states is due to her ongoing constipation. Last had a bowel movement last night and has been having regular bowel movements with Miralax; she is being followed by PCP for this and has an appointemnt with her PCP in 1 week to discuss this. States that her jardiance causes frequent vaginal infections, and she is always treated with Flagyl for these. Denies hematuria, dysuria, frequency, urgency, back pain, n/v/d/abd pain, fevers/chills.   HPI  Past Medical History:  Diagnosis Date  . Acute post-traumatic headache 06/09/2019   See 06/09/19 ED visit note  . Anxiety   . Asthma, mild persistent 06/29/2011  . Bipolar affective disorder ([place]) 09/22/2018  . Cannabis dependence   . Carpal tunnel syndrome of left wrist 02/16/2019  . Concussion 06/09/2019   See ED visit note 06/09/19  . Diabetes mellitus without complication ([place])   . 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 Marijuana use 10/26/2018  . Maternal morbid obesity, 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[place]) 06/29/2011  . 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 Rh negative state in antepartum period 03/02/2018   Will need rho gam  . S/P emergency cesarean section 06/18/2018  . Seasonal allergies 06/29/2011   On zyrtec OTC    . Suicidal behavior    2013, 2018  . Supervision of high risk pregnancy, antepartum 01/12/2018    Nursing Staff Provider Office Location  [facility] Dating  [place] and 9.1 week [place] 12/08/18 Language  english Anatomy US   02/13/18 Flu  Vaccine  Declined 01/12/18 Genetic Screen  NIPS: low risk  AFP: neg   TDaP vaccine   04/19/2018 Hgb A1C or  GTT Early  Third trimester  Rhogam  04/19/2018   LAB RESULTS  Feeding Plan both Blood Type --/--/A NEG (03/13 0947) A NEG  Contraception  condoms Antibody POS (03  . Tobacco use 09/18/2018  . Type 2 diabetes mellitus complicating pregnancy in third trimester, antepartum 01/05/2018   Current Diabetic Medications:  Insulin  [x]  Aspirin 81 mg daily after 12 weeks (? A2/B GDM)  For A2/B GDM or higher classes of DM [x]  Diabetes Education and Testing Supplies [x]  Nutrition Counsult [ ]  Fetal ECHO after 20 weeks - ordered 12/26 [ ]  Eye exam for retina evaluation   Baseline and surveillance labs (pulled in from EPIC, refresh links as needed)  Lab Results Component Value Date  CREATIN    Patient Active Problem List   Diagnosis Date Noted  . Severe recurrent major depression without psychotic features ([place]) 10/10/2018  . Morbid obesity ([place]) 09/22/2018  . Bipolar affective disorder ([place]) 09/22/2018  . Tobacco use 09/18/2018  . Poorly controlled type 2 diabetes mellitus ([place]) 11/06/2016  . Exercise-induced asthma 10-05-1989    Past Surgical History:  Procedure Laterality Date  . CESAREAN SECTION N/A 06/19/2018   Procedure: CESAREAN SECTION;  Surgeon: Osborne Oman, MD;  Location: [facility];  Service: Obstetrics;  Laterality: N/A;  . NO PAST SURGERIES  OB History    Gravida  6   Para  4   Term  1   Preterm  3   AB  1   Living  4     SAB  1   IAB  0   Ectopic  0   Multiple  0   Live Births  4            Home Medications    Prior to Admission medications   Medication Sig Start Date End Date Taking? Authorizing Provider  empagliflozin (JARDIANCE) 25 MG TABS tablet Take by mouth daily.   Yes [provider]  metFORMIN (GLUCOPHAGE) 1000 MG tablet TAKE 1 TABLET(1000 MG) BY MOUTH TWICE DAILY WITH A MEAL 01/15/20  Yes McDiarmid, Blane Ohara, MD  fluconazole  (DIFLUCAN) 150 MG tablet Take one tablet by mouth as a single dose. May repeat in 3 days if symptoms persist. 01/17/20   Vanessa Kick, MD  metroNIDAZOLE (FLAGYL) 500 MG tablet Take 1 tablet (500 mg total) by mouth 2 (two) times daily. 02/26/20   Hazel Sams, PA-C    Family History Family History  Problem Relation Age of Onset  . Sickle cell anemia Mother   . Depression Mother   . Diabetes Mother   . Asthma Mother   . Diabetes Other        grandparents and aunts/uncles  . Stroke Other        grandparent  . Alcohol abuse Father   . Depression Father   . Alcohol abuse Brother   . Drug abuse Brother     Social History Social History   Tobacco Use  . Smoking status: Current Some Day Smoker    Packs/day: 0.25    Types: Cigarettes  . Smokeless tobacco: Never Used  Vaping Use  . Vaping Use: Never used  Substance Use Topics  . Alcohol use: Not Currently    Comment: ocassionally  . Drug use: Not Currently    Comment: History of Marijuana - a few ago from  February 16, 2019     Allergies   Monistat [tioconazole], Chocolate, Ibuprofen, and Banana   Review of Systems Review of Systems  Gastrointestinal: Positive for abdominal pain.  Genitourinary: Positive for vaginal discharge.  All other systems reviewed and are negative.    Physical Exam Triage Vital Signs ED Triage Vitals  Enc Vitals Group     BP 02/26/20 1016 131/89     Pulse Rate 02/26/20 1016 64     Resp 02/26/20 1016 16     Temp 02/26/20 1016 98.5 F (36.9 C)     Temp Source 02/26/20 1016 Oral     SpO2 02/26/20 1016 96 %     Weight --      Height --      Head Circumference --      Peak Flow --      Pain Score 02/26/20 1015 0     Pain Loc --      Pain Edu? --      Excl. in [place]? --    No data found.  Updated Vital Signs BP 131/89 (BP Location: Right Arm)   Pulse 64   Temp 98.5 F (36.9 C) (Oral)   Resp 16   LMP 02/09/2020   SpO2 96%   Visual Acuity Right Eye Distance:   Left Eye  Distance:   Bilateral Distance:    Right Eye Near:   Left Eye Near:    Bilateral Near:  Physical Exam Vitals reviewed.  Constitutional:      General: She is not in acute distress.    Appearance: Normal appearance. She is not ill-appearing.  HENT:     Head: Normocephalic and atraumatic.  Cardiovascular:     Rate and Rhythm: Normal rate and regular rhythm.     Heart sounds: Normal heart sounds.  Pulmonary:     Effort: Pulmonary effort is normal.     Breath sounds: Normal breath sounds.  Abdominal:     General: Bowel sounds are normal. There is no distension.     Palpations: Abdomen is soft. There is no mass.     Tenderness: There is abdominal tenderness in the suprapubic area. There is no right CVA tenderness, left CVA tenderness, guarding or rebound.  Neurological:     General: No focal deficit present.     Mental Status: She is alert and oriented to person, place, and time. Mental status is at baseline.  Psychiatric:        Mood and Affect: Mood normal.        Behavior: Behavior normal.        Thought Content: Thought content normal.        Judgment: Judgment normal.      UC Treatments / Results  Labs (all labs ordered are listed, but only abnormal results are displayed) Labs Reviewed  POCT URINALYSIS DIPSTICK, ED / UC - Abnormal; Notable for the following components:      Result Value   Hgb urine dipstick SMALL (*)    Leukocytes,Ua MODERATE (*)    All other components within normal limits  HIV ANTIBODY (ROUTINE TESTING W REFLEX)  RPR  POC URINE PREG, ED  CERVICOVAGINAL ANCILLARY ONLY    EKG   Radiology No results found.  Procedures Procedures (including critical care time)  Medications Ordered in UC Medications - No data to display  Initial Impression / Assessment and Plan / UC Course  I have reviewed the triage vital signs and the nursing notes.  Pertinent labs & imaging results that were available during my care of the patient were reviewed by  me and considered in my medical decision making (see chart for details).     Given history of recurrent BV, plan to treat for BV as below. Pt has tolerated Flagyl well in the past; she understands not to drink alcohol while on this medication.   We have sent testing for sexually transmitted infections. We will notify you of any positive results once they are received. If required, we will prescribe any medications you might need.   Please refrain from all sexual activity for at least the next seven days.  Seek additional medical attention if you develop fevers/chills, new/worsening abdominal pain, new/worsening vaginal discomfort/discharge, etc. Patient verbalizes understanding and agreement.  Urine pregnancy negative today.  Continue to follow with PCP for constipation. Continue miralax, good hydration, fibrous foods, etc.   Final Clinical Impressions(s) / UC Diagnoses   Final diagnoses:  Bacterial vaginosis     Discharge Instructions     Start the Flagyl. This will treat your BV. (It's also the treatment for Trichomonas, in case you are positive for this). Make sure not to drink alcohol while on this medication! We'll call you with the results of your labwork if anything is positive.   Follow-up with your primary care provider in 1 week to discuss constipation.    ED Prescriptions    Medication Sig Dispense Auth. Provider   metroNIDAZOLE (FLAGYL) 500  MG tablet Take 1 tablet (500 mg total) by mouth 2 (two) times daily. 14 tablet Hazel Sams, PA-C     PDMP not reviewed this encounter.   Hazel Sams, PA-C 02/26/20 1132

## 2020-02-26 NOTE — ED Triage Notes (Signed)
Pt called once in lobby with no answer ° °

## 2020-02-27 LAB — CERVICOVAGINAL ANCILLARY ONLY
Bacterial Vaginitis (gardnerella): POSITIVE — AB
Candida Glabrata: NEGATIVE
Candida Vaginitis: NEGATIVE
Chlamydia: NEGATIVE
Comment: NEGATIVE
Comment: NEGATIVE
Comment: NEGATIVE
Comment: NEGATIVE
Comment: NEGATIVE
Comment: NORMAL
Neisseria Gonorrhea: NEGATIVE
Trichomonas: NEGATIVE

## 2020-02-27 LAB — RPR: RPR Ser Ql: NONREACTIVE

## 2020-03-24 ENCOUNTER — Encounter: Payer: Self-pay | Admitting: Student in an Organized Health Care Education/Training Program

## 2020-03-24 NOTE — Progress Notes (Signed)
Received page on after hours pager to call patient. Phone rang and went to voicemail. Left a message to call or go to emergency department if having an emergency department, otherwise, our clinic is open for virtual appointments and she can call to schedule to be seen in person this week or virtually while clinic is closed for weather.

## 2020-03-26 ENCOUNTER — Other Ambulatory Visit: Payer: Self-pay

## 2020-03-26 ENCOUNTER — Encounter (HOSPITAL_COMMUNITY): Payer: Self-pay | Admitting: *Deleted

## 2020-03-26 ENCOUNTER — Emergency Department (HOSPITAL_COMMUNITY)
Admission: EM | Admit: 2020-03-26 | Discharge: 2020-03-26 | Disposition: A | Discharge status: Home / Self Care | Payer: Medicaid Other | Attending: Emergency Medicine | Admitting: Emergency Medicine

## 2020-03-26 ENCOUNTER — Emergency Department (HOSPITAL_COMMUNITY): Payer: Medicaid Other

## 2020-03-26 DIAGNOSIS — S70212A Abrasion, left hip, initial encounter: Secondary | ICD-10-CM | POA: Diagnosis not present

## 2020-03-26 DIAGNOSIS — S70312A Abrasion, left thigh, initial encounter: Secondary | ICD-10-CM | POA: Diagnosis not present

## 2020-03-26 DIAGNOSIS — F17210 Nicotine dependence, cigarettes, uncomplicated: Secondary | ICD-10-CM | POA: Diagnosis not present

## 2020-03-26 DIAGNOSIS — E119 Type 2 diabetes mellitus without complications: Secondary | ICD-10-CM | POA: Diagnosis not present

## 2020-03-26 DIAGNOSIS — Y92410 Unspecified street and highway as the place of occurrence of the external cause: Secondary | ICD-10-CM | POA: Diagnosis not present

## 2020-03-26 DIAGNOSIS — Z7984 Long term (current) use of oral hypoglycemic drugs: Secondary | ICD-10-CM | POA: Diagnosis not present

## 2020-03-26 DIAGNOSIS — S40811A Abrasion of right upper arm, initial encounter: Secondary | ICD-10-CM | POA: Insufficient documentation

## 2020-03-26 DIAGNOSIS — S30810A Abrasion of lower back and pelvis, initial encounter: Secondary | ICD-10-CM | POA: Diagnosis not present

## 2020-03-26 DIAGNOSIS — S8991XA Unspecified injury of right lower leg, initial encounter: Secondary | ICD-10-CM | POA: Diagnosis present

## 2020-03-26 DIAGNOSIS — J45909 Unspecified asthma, uncomplicated: Secondary | ICD-10-CM | POA: Diagnosis not present

## 2020-03-26 DIAGNOSIS — S40812A Abrasion of left upper arm, initial encounter: Secondary | ICD-10-CM | POA: Diagnosis not present

## 2020-03-26 DIAGNOSIS — S80211A Abrasion, right knee, initial encounter: Secondary | ICD-10-CM | POA: Diagnosis not present

## 2020-03-26 DIAGNOSIS — S80212A Abrasion, left knee, initial encounter: Secondary | ICD-10-CM | POA: Diagnosis not present

## 2020-03-26 DIAGNOSIS — M7918 Myalgia, other site: Secondary | ICD-10-CM

## 2020-03-26 DIAGNOSIS — T07XXXA Unspecified multiple injuries, initial encounter: Secondary | ICD-10-CM

## 2020-03-26 LAB — CBG MONITORING, ED: Glucose-Capillary: 97 mg/dL (ref 70–99)

## 2020-03-26 MED ORDER — OXYCODONE HCL 5 MG PO TABS: 5.0000 mg | ORAL_TABLET | Freq: Four times a day (QID) | ORAL | 0 refills | Status: DC | PRN

## 2020-03-26 MED ORDER — ACETAMINOPHEN 325 MG PO TABS
650.0000 mg | ORAL_TABLET | Freq: Once | ORAL | Status: AC | PRN
  Administered 2020-03-26: 650 mg via ORAL
  Filled 2020-03-26: qty 2

## 2020-03-26 MED ORDER — OXYCODONE HCL 5 MG PO TABS
5.0000 mg | ORAL_TABLET | Freq: Once | ORAL | Status: AC
  Administered 2020-03-26: 5 mg via ORAL
  Filled 2020-03-26: qty 1
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
## 2020-03-26 NOTE — Progress Notes (Signed)
CSW updated by RN that pt is in need of ride home at this time. CSW spoke with Suezanne Jacquet from [employer] to establish ride. CSW advised that ride will be here in 10 minutes.   Virgie Dad Darshana Curnutt, MSW, LCSW [facility] [place] at [place] [phone]

## 2020-03-26 NOTE — ED Notes (Signed)
Patient transported to X-ray 
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
## 2020-03-27 ENCOUNTER — Telehealth: Payer: Self-pay

## 2020-03-27 NOTE — Telephone Encounter (Signed)
Transition of Care Contact from Inpatient Facility  Date of Discharge: 03/26/20 Date of Contact: 03/27/20 Method of contact: phone -  attempted  Attempted to contact patient to discuss transition of care from inpatient admission.  Patient did not answer the phone.  Message was left on patient's voicemail informing them we would attempt to call them again   Rosangela Fehrenbach, rma 

## 2020-04-17 ENCOUNTER — Other Ambulatory Visit: Payer: Self-pay

## 2020-04-17 ENCOUNTER — Encounter: Payer: Self-pay | Admitting: Family Medicine

## 2020-04-17 ENCOUNTER — Ambulatory Visit (INDEPENDENT_AMBULATORY_CARE_PROVIDER_SITE_OTHER): Payer: Medicaid Other | Admitting: Family Medicine

## 2020-04-17 DIAGNOSIS — Z0000 Encounter for general adult medical examination without abnormal findings: Secondary | ICD-10-CM

## 2020-04-17 DIAGNOSIS — E119 Type 2 diabetes mellitus without complications: Secondary | ICD-10-CM | POA: Diagnosis not present

## 2020-04-17 DIAGNOSIS — L989 Disorder of the skin and subcutaneous tissue, unspecified: Secondary | ICD-10-CM

## 2020-04-17 DIAGNOSIS — Z1159 Encounter for screening for other viral diseases: Secondary | ICD-10-CM

## 2020-04-17 DIAGNOSIS — L608 Other nail disorders: Secondary | ICD-10-CM | POA: Diagnosis not present

## 2020-04-17 DIAGNOSIS — R5383 Other fatigue: Secondary | ICD-10-CM | POA: Diagnosis not present

## 2020-04-17 DIAGNOSIS — Z1211 Encounter for screening for malignant neoplasm of colon: Secondary | ICD-10-CM

## 2020-04-17 DIAGNOSIS — L609 Nail disorder, unspecified: Secondary | ICD-10-CM

## 2020-04-17 DIAGNOSIS — E1165 Type 2 diabetes mellitus with hyperglycemia: Secondary | ICD-10-CM | POA: Diagnosis not present

## 2020-04-17 DIAGNOSIS — E1169 Type 2 diabetes mellitus with other specified complication: Secondary | ICD-10-CM | POA: Diagnosis not present

## 2020-04-17 LAB — POCT GLYCOSYLATED HEMOGLOBIN (HGB A1C): Hemoglobin A1C: 6.2 % — AB (ref 4.0–5.6)

## 2020-04-17 LAB — POCT SKIN KOH: Skin KOH, POC: NEGATIVE

## 2020-04-17 NOTE — Patient Instructions (Signed)
We are looking to see if the fingernail & toenail and scalp problems are related.  My first thoughts is it could be fungus or psoriasis.  I have sent the fingernail scales for fungal culture.  This can take several weeks before there is a final result.   I will send the photos to a dermatologist to see what they think is going on.   We are checking basic labs to look at your kidney, electrolytes, hemoglobin to see if there is anything that could be causing your fatigue.    Your A1c blood sugar control is great.  Keep taking your metformin and Jardiance.    I will contact you once I have some results back.
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
## 2020-04-18 ENCOUNTER — Encounter: Payer: Self-pay | Admitting: Family Medicine

## 2020-04-18 DIAGNOSIS — L609 Nail disorder, unspecified: Secondary | ICD-10-CM

## 2020-04-18 DIAGNOSIS — R5383 Other fatigue: Secondary | ICD-10-CM | POA: Insufficient documentation

## 2020-04-18 DIAGNOSIS — Z0000 Encounter for general adult medical examination without abnormal findings: Secondary | ICD-10-CM | POA: Insufficient documentation

## 2020-04-18 HISTORY — DX: Nail disorder, unspecified: L60.9

## 2020-04-18 LAB — BASIC METABOLIC PANEL
BUN/Creatinine Ratio: 12 (ref 9–23)
BUN: 10 mg/dL (ref 6–20)
CO2: 22 mmol/L (ref 20–29)
Calcium: 9.6 mg/dL (ref 8.7–10.2)
Chloride: 103 mmol/L (ref 96–106)
Creatinine, Ser: 0.85 mg/dL (ref 0.57–1.00)
GFR calc Af Amer: 106 mL/min/{1.73_m2} (ref >59)
GFR calc non Af Amer: 92 mL/min/{1.73_m2} (ref >59)
Glucose: 111 mg/dL — ABNORMAL HIGH (ref 65–99)
Potassium: 4 mmol/L (ref 3.5–5.2)
Sodium: 139 mmol/L (ref 134–144)

## 2020-04-18 LAB — CBC
Hematocrit: 31.5 % — ABNORMAL LOW (ref 34.0–46.6)
Hemoglobin: 10.8 g/dL — ABNORMAL LOW (ref 11.1–15.9)
MCH: 29.8 pg (ref 26.6–33.0)
MCHC: 34.3 g/dL (ref 31.5–35.7)
MCV: 87 fL (ref 79–97)
Platelets: 381 10*3/uL (ref 150–450)
RBC: 3.63 x10E6/uL — ABNORMAL LOW (ref 3.77–5.28)
RDW: 14.9 % (ref 11.7–15.4)
WBC: 8.7 10*3/uL (ref 3.4–10.8)

## 2020-04-18 MED ORDER — METFORMIN HCL 1000 MG PO TABS: 1000.0000 mg | ORAL_TABLET | Freq: Two times a day (BID) | ORAL | 3 refills | Status: DC

## 2020-04-18 MED ORDER — EMPAGLIFLOZIN 25 MG PO TABS: 25.0000 mg | ORAL_TABLET | Freq: Every day | ORAL | 3 refills | Status: DC

## 2020-04-18 NOTE — Assessment & Plan Note (Addendum)
>>  ASSESSMENT AND PLAN FOR TYPE 2 DIABETES MELLITUS WITHOUT COMPLICATION, WITHOUT LONG-TERM CURRENT USE OF INSULIN (HCC) WRITTEN ON 04/18/2020  9:38 AM BY Vandy Tsuchiya D, MD  Lab Results  Component Value Date   HGBA1C 6.2 (A) 04/17/2020   Established problem. Adequate glycemic control.  Pt is tolerating the current medication regiment. Continue current treatment plan.     >>ASSESSMENT AND PLAN FOR TYPE 2 DIABETES MELLITUS WITH MORBID OBESITY (HCC) WRITTEN ON 04/18/2020  9:37 AM BY Shanti Agresti D, MD  Lab Results  Component Value Date   HGBA1C 6.2 (A) 04/17/2020
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
## 2020-04-18 NOTE — Assessment & Plan Note (Addendum)
Needs PSV-23 and  HCV screen placed as add-on to blood drawn 04/17/20

## 2020-04-18 NOTE — Assessment & Plan Note (Addendum)
New problem Onset one month ago of  progressive changes in some fingernails and toenails.   Base of fingernails have crumbling white nail on  right hand thumb, third, and fourth digit.  The left hand has similar fingernail changes  of thumb and fifth digit.  Nail lesions aren't painful.  No paronychial inflammation.   Feet bilaterally have moccasin skin thickening with fine scaling and thickened nails.  Two to three digits on each foot with similar crumbling white toenail bases.   Patient has developed in association with these nail changes a three cm diameter thick annular plaque  just proximal to front hairline.  The plaque does not have central clearing.   She has been unemployed for several months.  No known caustic exposures. She has not tried any treatments for lesions.  No pain, itching, or fever  The patient's medical history includes obesity (BMI 37%), and well-controlled DMT2 on metformin and empagliflozin.  Her last pregnancy was 2 years ago.  She is otherwise healthy.    KOH of scalp lesion was negative for fungal elements.  Unfortunately, attempt at photographing scalp lesion failed.  Photos of both hands attached A/ Ddx: Tinea unguium Vs Psoriasis  I favor psoriasis, but not before seen an annular scalp plaque with psoriasis.  P/ Debris from fingernails was sent for fungal culture. 04/24/20 MyChart messaging discussion.  I recommended empiric treatment for possible tinea unguium and tinea capitis with terbinafine 250 mg daily for at least 6 weeks for fingernails and scalp, with understanding that may have to extend an additional 6 weeks for treatment of toenails, if desired.    Liver injury side effect relayed and need for LFT testing should she wish to continue beyond 6 weeks of therapy.  While patient has a history of adverse reaction to fluconazole, terbinafine is an allyamine.

## 2020-04-18 NOTE — Assessment & Plan Note (Signed)
Lab Results  Component Value Date   HGBA1C 6.2 (A) 04/17/2020

## 2020-04-21 ENCOUNTER — Encounter: Payer: Self-pay | Admitting: Family Medicine

## 2020-04-22 LAB — HEPATITIS C ANTIBODY: Hep C Virus Ab: <0.1 s/co ratio (ref 0.0–0.9)

## 2020-04-22 LAB — SPECIMEN STATUS REPORT

## 2020-04-24 ENCOUNTER — Other Ambulatory Visit: Payer: Self-pay | Admitting: Family Medicine

## 2020-04-24 DIAGNOSIS — L989 Disorder of the skin and subcutaneous tissue, unspecified: Secondary | ICD-10-CM

## 2020-04-24 DIAGNOSIS — L609 Nail disorder, unspecified: Secondary | ICD-10-CM

## 2020-04-24 HISTORY — DX: Disorder of the skin and subcutaneous tissue, unspecified: L98.9

## 2020-04-24 MED ORDER — TERBINAFINE HCL 250 MG PO TABS: 250.0000 mg | ORAL_TABLET | Freq: Every day | ORAL | 0 refills | Status: AC

## 2020-04-24 NOTE — Progress Notes (Signed)
See note listed under Tashira Leggette.  It is headed in error.  The ov note is by Lissa Morales, MD

## 2020-04-24 NOTE — Assessment & Plan Note (Signed)
Possible tinea capitis given the kerion-like appearance and the working diagnosis of tinea unguium.  04/24/20 MyChart messaging discussion.  I recommended empiric treatment for possible tinea unguium and tinea capitis with terbinafine 250 mg daily for at least 6 weeks for fingernails and scalp, with understanding that may have to extend an additional 6 weeks for treatment of toenails, if desired.    Liver injury side effect relayed and need for LFT testing should she wish to continue beyond 6 weeks of therapy.  While patient has a history of adverse reaction to fluconazole, terbinafine is an allyamine.

## 2020-05-21 ENCOUNTER — Encounter: Payer: Self-pay | Admitting: Family Medicine

## 2020-05-22 LAB — FUNGUS CULTURE W SMEAR

## 2020-05-22 NOTE — Telephone Encounter (Signed)
-----   Message from Martyn Malay, MD sent at 05/22/2020  8:46 AM EDT -----  ----- Message ----- From: Lavone Neri Lab Results In Sent: 04/19/2020   3:35 PM EDT To: Martyn Malay, MD

## 2020-05-22 NOTE — Telephone Encounter (Signed)
I spoke with Ms Ladd about the negative fungal culture.  I asked her about the scalp and nail lesions.   It sounds as if there has been nor significant change.  I asked Ms Farone to send me photos of her scalp lesion and her nails thru MyChart.  Will review.

## 2020-06-18 ENCOUNTER — Emergency Department (HOSPITAL_COMMUNITY)
Admission: EM | Admit: 2020-06-18 | Discharge: 2020-06-18 | Discharge status: Against Medical Advice | Payer: Medicaid Other | Attending: Emergency Medicine | Admitting: Emergency Medicine
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
## 2020-08-07 ENCOUNTER — Other Ambulatory Visit: Payer: Self-pay

## 2020-08-07 ENCOUNTER — Ambulatory Visit (HOSPITAL_COMMUNITY)
Admission: EM | Admit: 2020-08-07 | Discharge: 2020-08-07 | Disposition: A | Discharge status: Home / Self Care | Payer: Medicaid Other | Attending: Emergency Medicine | Admitting: Emergency Medicine

## 2020-08-07 ENCOUNTER — Encounter (HOSPITAL_COMMUNITY): Payer: Self-pay

## 2020-08-07 DIAGNOSIS — N898 Other specified noninflammatory disorders of vagina: Secondary | ICD-10-CM | POA: Diagnosis not present

## 2020-08-07 MED ORDER — FLUCONAZOLE 150 MG PO TABS: 150.0000 mg | ORAL_TABLET | Freq: Every day | ORAL | 0 refills | Status: AC
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
## 2020-08-07 NOTE — Discharge Instructions (Addendum)
Take one pill today, if having symptoms in 3 days may take the second pill  Labs pending 2-3 days, you will be called if positive for treatment  Do not have sex until labs results, if positive do not have sex until treatment is complete and symptoms resolve

## 2020-08-08 ENCOUNTER — Telehealth (HOSPITAL_COMMUNITY): Payer: Self-pay | Admitting: Emergency Medicine

## 2020-08-08 LAB — CERVICOVAGINAL ANCILLARY ONLY
Bacterial Vaginitis (gardnerella): POSITIVE — AB
Candida Glabrata: NEGATIVE
Candida Vaginitis: NEGATIVE
Chlamydia: NEGATIVE
Comment: NEGATIVE
Comment: NEGATIVE
Comment: NEGATIVE
Comment: NEGATIVE
Comment: NEGATIVE
Comment: NORMAL
Neisseria Gonorrhea: NEGATIVE
Trichomonas: NEGATIVE

## 2020-08-08 MED ORDER — METRONIDAZOLE 500 MG PO TABS: 500.0000 mg | ORAL_TABLET | Freq: Two times a day (BID) | ORAL | 0 refills | Status: DC

## 2020-10-12 ENCOUNTER — Encounter (HOSPITAL_COMMUNITY): Payer: Self-pay

## 2020-10-12 ENCOUNTER — Other Ambulatory Visit: Payer: Self-pay

## 2020-10-12 ENCOUNTER — Ambulatory Visit (HOSPITAL_COMMUNITY)
Admission: EM | Admit: 2020-10-12 | Discharge: 2020-10-12 | Disposition: A | Discharge status: Home / Self Care | Payer: Medicaid Other

## 2020-10-12 DIAGNOSIS — N949 Unspecified condition associated with female genital organs and menstrual cycle: Secondary | ICD-10-CM | POA: Diagnosis not present

## 2020-10-12 NOTE — Discharge Instructions (Addendum)
Return or go to the Emergency Department if symptoms worsen or do not improve in the next few days.

## 2020-10-12 NOTE — ED Triage Notes (Signed)
Pt presents with possible tampon stuck in vagina.
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
## 2020-10-12 NOTE — ED Notes (Signed)
Called pt on phone to come into triage

## 2020-11-07 ENCOUNTER — Other Ambulatory Visit: Payer: Self-pay

## 2020-11-07 ENCOUNTER — Emergency Department (EMERGENCY_DEPARTMENT_HOSPITAL)
Admission: EM | Admit: 2020-11-07 | Discharge: 2020-11-07 | Disposition: A | Discharge status: Other Acute Inpatient Hospital | Payer: Medicaid Other | Source: Home / Self Care

## 2020-11-07 ENCOUNTER — Inpatient Hospital Stay (HOSPITAL_COMMUNITY): Admission: RE | Admit: 2020-11-07 | Payer: Medicaid Other | Source: Intra-hospital | Admitting: Emergency Medicine

## 2020-11-07 ENCOUNTER — Inpatient Hospital Stay (HOSPITAL_COMMUNITY)
Admission: AD | Admit: 2020-11-07 | Discharge: 2020-11-10 | DRG: 882 | Disposition: A | Discharge status: Home / Self Care | Payer: Medicaid Other | Source: Intra-hospital | Attending: Emergency Medicine | Admitting: Emergency Medicine

## 2020-11-07 ENCOUNTER — Encounter (HOSPITAL_COMMUNITY): Payer: Self-pay | Admitting: Behavioral Health

## 2020-11-07 DIAGNOSIS — Z825 Family history of asthma and other chronic lower respiratory diseases: Secondary | ICD-10-CM | POA: Diagnosis not present

## 2020-11-07 DIAGNOSIS — Z87898 Personal history of other specified conditions: Secondary | ICD-10-CM

## 2020-11-07 DIAGNOSIS — F17210 Nicotine dependence, cigarettes, uncomplicated: Secondary | ICD-10-CM | POA: Insufficient documentation

## 2020-11-07 DIAGNOSIS — Z811 Family history of alcohol abuse and dependence: Secondary | ICD-10-CM

## 2020-11-07 DIAGNOSIS — E119 Type 2 diabetes mellitus without complications: Secondary | ICD-10-CM | POA: Diagnosis present

## 2020-11-07 DIAGNOSIS — Z9151 Personal history of suicidal behavior: Secondary | ICD-10-CM

## 2020-11-07 DIAGNOSIS — Z818 Family history of other mental and behavioral disorders: Secondary | ICD-10-CM

## 2020-11-07 DIAGNOSIS — R45851 Suicidal ideations: Secondary | ICD-10-CM | POA: Diagnosis present

## 2020-11-07 DIAGNOSIS — Z813 Family history of other psychoactive substance abuse and dependence: Secondary | ICD-10-CM | POA: Diagnosis not present

## 2020-11-07 DIAGNOSIS — Z7984 Long term (current) use of oral hypoglycemic drugs: Secondary | ICD-10-CM | POA: Diagnosis not present

## 2020-11-07 DIAGNOSIS — F1210 Cannabis abuse, uncomplicated: Secondary | ICD-10-CM | POA: Diagnosis present

## 2020-11-07 DIAGNOSIS — F329 Major depressive disorder, single episode, unspecified: Secondary | ICD-10-CM | POA: Insufficient documentation

## 2020-11-07 DIAGNOSIS — F1010 Alcohol abuse, uncomplicated: Secondary | ICD-10-CM | POA: Diagnosis present

## 2020-11-07 DIAGNOSIS — Z886 Allergy status to analgesic agent status: Secondary | ICD-10-CM

## 2020-11-07 DIAGNOSIS — F4323 Adjustment disorder with mixed anxiety and depressed mood: Secondary | ICD-10-CM | POA: Diagnosis present

## 2020-11-07 DIAGNOSIS — J4530 Mild persistent asthma, uncomplicated: Secondary | ICD-10-CM | POA: Insufficient documentation

## 2020-11-07 DIAGNOSIS — Z79899 Other long term (current) drug therapy: Secondary | ICD-10-CM | POA: Diagnosis not present

## 2020-11-07 DIAGNOSIS — N898 Other specified noninflammatory disorders of vagina: Secondary | ICD-10-CM | POA: Diagnosis present

## 2020-11-07 DIAGNOSIS — Z6837 Body mass index (BMI) 37.0-37.9, adult: Secondary | ICD-10-CM

## 2020-11-07 DIAGNOSIS — Z91018 Allergy to other foods: Secondary | ICD-10-CM | POA: Diagnosis not present

## 2020-11-07 DIAGNOSIS — R45850 Homicidal ideations: Secondary | ICD-10-CM | POA: Diagnosis present

## 2020-11-07 DIAGNOSIS — N9489 Other specified conditions associated with female genital organs and menstrual cycle: Secondary | ICD-10-CM | POA: Insufficient documentation

## 2020-11-07 DIAGNOSIS — F1220 Cannabis dependence, uncomplicated: Secondary | ICD-10-CM | POA: Diagnosis present

## 2020-11-07 DIAGNOSIS — Z832 Family history of diseases of the blood and blood-forming organs and certain disorders involving the immune mechanism: Secondary | ICD-10-CM

## 2020-11-07 DIAGNOSIS — Z888 Allergy status to other drugs, medicaments and biological substances status: Secondary | ICD-10-CM

## 2020-11-07 DIAGNOSIS — E669 Obesity, unspecified: Secondary | ICD-10-CM | POA: Diagnosis present

## 2020-11-07 DIAGNOSIS — Z823 Family history of stroke: Secondary | ICD-10-CM

## 2020-11-07 DIAGNOSIS — Z20822 Contact with and (suspected) exposure to covid-19: Secondary | ICD-10-CM | POA: Diagnosis present

## 2020-11-07 DIAGNOSIS — F332 Major depressive disorder, recurrent severe without psychotic features: Secondary | ICD-10-CM | POA: Diagnosis not present

## 2020-11-07 DIAGNOSIS — Z833 Family history of diabetes mellitus: Secondary | ICD-10-CM | POA: Diagnosis not present

## 2020-11-07 DIAGNOSIS — Y908 Blood alcohol level of 240 mg/100 ml or more: Secondary | ICD-10-CM | POA: Diagnosis present

## 2020-11-07 HISTORY — DX: Suicidal ideations: R45.851

## 2020-11-07 LAB — GLUCOSE, CAPILLARY
Glucose-Capillary: 128 mg/dL — ABNORMAL HIGH (ref 70–99)
Glucose-Capillary: 108 mg/dL — ABNORMAL HIGH (ref 70–99)

## 2020-11-07 LAB — CBC
HCT: 33.7 % — ABNORMAL LOW (ref 36.0–46.0)
Hemoglobin: 11.1 g/dL — ABNORMAL LOW (ref 12.0–15.0)
MCH: 28 pg (ref 26.0–34.0)
MCHC: 32.9 g/dL (ref 30.0–36.0)
MCV: 84.9 fL (ref 80.0–100.0)
Platelets: 428 10*3/uL — ABNORMAL HIGH (ref 150–400)
RBC: 3.97 MIL/uL (ref 3.87–5.11)
RDW: 17.1 % — ABNORMAL HIGH (ref 11.5–15.5)
WBC: 10.4 10*3/uL (ref 4.0–10.5)
nRBC: 0 % (ref 0.0–0.2)

## 2020-11-07 LAB — RAPID URINE DRUG SCREEN, HOSP PERFORMED
Amphetamines: NOT DETECTED
Barbiturates: NOT DETECTED
Benzodiazepines: NOT DETECTED
Cocaine: NOT DETECTED
Opiates: NOT DETECTED
Tetrahydrocannabinol: POSITIVE — AB

## 2020-11-07 LAB — I-STAT BETA HCG BLOOD, ED (MC, WL, AP ONLY): I-stat hCG, quantitative: <5 m[IU]/mL (ref <5)

## 2020-11-07 LAB — RESP PANEL BY RT-PCR (FLU A&B, COVID) ARPGX2
Influenza A by PCR: NEGATIVE
Influenza B by PCR: NEGATIVE
SARS Coronavirus 2 by RT PCR: NEGATIVE

## 2020-11-07 LAB — COMPREHENSIVE METABOLIC PANEL
ALT: 15 U/L (ref 0–44)
AST: 18 U/L (ref 15–41)
Albumin: 3.7 g/dL (ref 3.5–5.0)
Alkaline Phosphatase: 60 U/L (ref 38–126)
Anion gap: 12 (ref 5–15)
BUN: <5 mg/dL — ABNORMAL LOW (ref 6–20)
CO2: 22 mmol/L (ref 22–32)
Calcium: 9.6 mg/dL (ref 8.9–10.3)
Chloride: 107 mmol/L (ref 98–111)
Creatinine, Ser: 0.9 mg/dL (ref 0.44–1.00)
GFR, Estimated: >60 mL/min (ref >60)
Glucose, Bld: 170 mg/dL — ABNORMAL HIGH (ref 70–99)
Potassium: 3.4 mmol/L — ABNORMAL LOW (ref 3.5–5.1)
Sodium: 141 mmol/L (ref 135–145)
Total Bilirubin: 0.4 mg/dL (ref 0.3–1.2)
Total Protein: 7.4 g/dL (ref 6.5–8.1)

## 2020-11-07 LAB — CBG MONITORING, ED
Glucose-Capillary: 118 mg/dL — ABNORMAL HIGH (ref 70–99)
Glucose-Capillary: 101 mg/dL — ABNORMAL HIGH (ref 70–99)
Glucose-Capillary: 126 mg/dL — ABNORMAL HIGH (ref 70–99)

## 2020-11-07 LAB — HEMOGLOBIN A1C
Hgb A1c MFr Bld: 6.5 % — ABNORMAL HIGH (ref 4.8–5.6)
Mean Plasma Glucose: 139.85 mg/dL

## 2020-11-07 LAB — ACETAMINOPHEN LEVEL: Acetaminophen (Tylenol), Serum: <10 ug/mL — ABNORMAL LOW (ref 10–30)

## 2020-11-07 LAB — SALICYLATE LEVEL: Salicylate Lvl: <7 mg/dL — ABNORMAL LOW (ref 7.0–30.0)

## 2020-11-07 LAB — ETHANOL: Alcohol, Ethyl (B): 298 mg/dL — ABNORMAL HIGH (ref <10)

## 2020-11-07 MED ORDER — EMPAGLIFLOZIN 25 MG PO TABS
25.0000 mg | ORAL_TABLET | Freq: Every day | ORAL | Status: DC
  Administered 2020-11-08 – 2020-11-10 (×3): 25 mg via ORAL
  Filled 2020-11-07 (×5): qty 1

## 2020-11-07 MED ORDER — METFORMIN HCL 500 MG PO TABS
1000.0000 mg | ORAL_TABLET | Freq: Two times a day (BID) | ORAL | Status: DC
  Administered 2020-11-07: 1000 mg via ORAL

## 2020-11-07 MED ORDER — METFORMIN HCL 500 MG PO TABS
1000.0000 mg | ORAL_TABLET | Freq: Two times a day (BID) | ORAL | Status: DC
Start: 2020-11-07 — End: 2020-11-10
  Administered 2020-11-07 – 2020-11-10 (×6): 1000 mg via ORAL
  Filled 2020-11-07 (×11): qty 2

## 2020-11-07 MED ORDER — INSULIN ASPART 100 UNIT/ML IJ SOLN: 0.0000 [IU] | Freq: Three times a day (TID) | INTRAMUSCULAR | Status: DC

## 2020-11-07 MED ORDER — MAGNESIUM HYDROXIDE 400 MG/5ML PO SUSP
30.0000 mL | Freq: Every day | ORAL | Status: DC | PRN
Start: 2020-11-07 — End: 2020-11-10

## 2020-11-07 MED ORDER — ALUM & MAG HYDROXIDE-SIMETH 200-200-20 MG/5ML PO SUSP: 30.0000 mL | ORAL | Status: DC | PRN

## 2020-11-07 MED ORDER — EMPAGLIFLOZIN 25 MG PO TABS
25.0000 mg | ORAL_TABLET | Freq: Every day | ORAL | Status: DC
  Administered 2020-11-07: 25 mg via ORAL
  Filled 2020-11-07: qty 1

## 2020-11-07 MED ORDER — INSULIN ASPART 100 UNIT/ML IJ SOLN
0.0000 [IU] | Freq: Three times a day (TID) | INTRAMUSCULAR | Status: DC
Start: 2020-11-07 — End: 2020-11-07

## 2020-11-07 MED ORDER — ACETAMINOPHEN 325 MG PO TABS: 650.0000 mg | ORAL_TABLET | Freq: Four times a day (QID) | ORAL | Status: DC | PRN

## 2020-11-07 MED ORDER — HYDROXYZINE HCL 25 MG PO TABS
25.0000 mg | ORAL_TABLET | Freq: Three times a day (TID) | ORAL | Status: DC | PRN
  Filled 2020-11-07: qty 1

## 2020-11-07 MED ORDER — TRAZODONE HCL 50 MG PO TABS
50.0000 mg | ORAL_TABLET | Freq: Every evening | ORAL | Status: DC | PRN
  Administered 2020-11-08: 50 mg via ORAL
  Filled 2020-11-07: qty 1

## 2020-11-07 NOTE — ED Triage Notes (Signed)
Pt states that she has been feeling suicidal for the past 6 hours. Pt also reports homicidal thoughts.
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
## 2020-11-07 NOTE — ED Notes (Signed)
Pt belongings inventoried and placed in locker #6, valuables taken to security consist of 1 cell phone

## 2020-11-07 NOTE — Progress Notes (Signed)
Patient information has been sent to [facility] via secure chat to review for potential admission. Patient meets inpatient criteria per Darrol Angel, NP .   Situation ongoing, CSW will continue to monitor progress.    Signed:  Mariea Clonts, MSW, LCSW-A  11/07/2020 11:28 AM

## 2020-11-07 NOTE — Progress Notes (Addendum)
Pt accepted to [facility] 301-2    Patient meets inpatient criteria per Lanette [place], NP   Dr.Amy Nelda Marseille is the attending provider.    Call report to [phone]    Ellery Plunk, RN @ [facility] ED notified.     Pt scheduled  to arrive at [facility] today. Pt's bed is available now.   Mariea Clonts, MSW, LCSW-A  1:22 PM 11/07/2020
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
## 2020-11-07 NOTE — [facility] Suicide Risk Assessment (Signed)
[facility] Admission Suicide Risk Assessment   Nursing information obtained from:  Patient Demographic factors:  Access to firearms Current Mental Status:  HI and irritability Loss Factors:  Financial problems / change in socioeconomic status Historical Factors:  Impulsivity, previous psychiatric treatment, alcohol use prior to admission, h/o previous abuse Risk Reduction Factors:  Employed  Total Time Spent in Direct Patient Care:  I personally spent 40 minutes on the unit in direct patient care. The direct patient care time included face-to-face time with the patient, reviewing the patient's chart, communicating with other professionals, and coordinating care. Greater than 50% of this time was spent in counseling or coordinating care with the patient regarding goals of hospitalization, psycho-education, and discharge planning needs.  Principal Problem: Adjustment disorder with mixed anxiety and depressed mood Diagnosis:  Principal Problem:   Adjustment disorder with mixed anxiety and depressed mood Active Problems:   Type 2 diabetes mellitus without complication, without long-term current use of insulin (HCC)   History of alcohol use disorder  Subjective Data: The patient is a 30y/o female with h/o MDD recurrent severe without psychotic features and alcohol use d/o, who presented to [facility] for management of SI and HI. Prior to admission, she states she had been arguing with her sister over their lease on their apartment and belief that her sister is not helping pay the bills. She states she was "stressed out" and called the police in an effort to be taken to the hospital to avoid "hurting her" and due to homicidal thoughts toward her sister. She denies making suicidal statements and denies current SI or HI today. She states she is frustrated and irritable because of psychosocial stressors, and she wants to find a new place to stay when she leaves. She denies prior suicide attempts but states she was  previously on Celexa for help with irritability and mood and would like to be back on this medication. She minimizes her recent drinking when confronted with the fact that her BAL was 298 on arrival to the ED. She admits she has a DUI but states that she only drinks on weekends and will not quantify the pattern or amount of her recent alcohol use. She denies current signs of withdrawal and denies other drug use. She states that she does not feel depressed and denies issues recently with sleep, appetite, energy, focus, or anhedonia. She denies h/o AVH, psychosis, or mania/hypomania. See H&P for additional details.   Continued Clinical Symptoms:  Alcohol Use Disorder Identification Test Final Score (AUDIT): 14 The "Alcohol Use Disorders Identification Test", Guidelines for Use in Primary Care, Second Edition.  World Pharmacologist [facility]). Score between 0-7:  no or low risk or alcohol related problems. Score between 8-15:  moderate risk of alcohol related problems. Score between 16-19:  high risk of alcohol related problems. Score 20 or above:  warrants further diagnostic evaluation for alcohol dependence and treatment.  CLINICAL FACTORS:   Alcohol/Substance Abuse/Dependencies More than one psychiatric diagnosis Previous Psychiatric Diagnoses and Treatments   Musculoskeletal: Strength & Muscle Tone: untested - resting in bed Gait & Station:  untested in bed Patient leans: N/A Psychiatric Specialty Exam: Physical Exam Vitals reviewed.  HENT:     Head: Normocephalic.  Pulmonary:     Effort: Pulmonary effort is normal.  Neurological:     General: No focal deficit present.     Mental Status: She is alert.    Review of Systems - see H&P  Blood pressure 137/87, pulse 96, temperature 98 F (  36.7 C), temperature source Oral, resp. rate 17, height '5\' 3"'$  (1.6 m), weight 94.8 kg, SpO2 100 %.Body mass index is 37.02 kg/m.  General Appearance:  obese, casually dressed, fair hygiene  Eye  Contact:  Good  Speech:  Clear and Coherent and Normal Rate  Volume:  Normal  Mood:  Anxious and Dysphoric  Affect:  Constricted  Thought Process:  Goal Directed and Linear  Orientation:  Full (Time, Place, and Person)  Thought Content:  Logical and no evidence of delusions, psychosis, or paranoia on exam  Suicidal Thoughts:  No  Homicidal Thoughts:  No  Memory:  Recent;   Good  Judgement:  Fair  Insight:  Lacking  Psychomotor Activity:  Normal  Concentration:  Concentration: Good and Attention Span: Good  Recall:  Good  Fund of Knowledge:  Good  Language:  Good  Akathisia:  Negative  Assets:  Communication Skills Desire for Improvement Housing Resilience Social Support Vocational/Educational  ADL's:  Intact  Cognition:  WNL  Sleep:  Number of Hours: 6.75   COGNITIVE FEATURES THAT CONTRIBUTE TO RISK:  Thought constriction (tunnel vision)    SUICIDE RISK:   Mild:  There are no identifiable plans, no associated intent, mild dysphoria and related symptoms,  few other risk factors, and identifiable protective factors, including available and accessible social support.  PLAN OF CARE: Patient admitted to [facility] and admission labs reviewed: UDS positive for THC, CMP WNL except for K+ 3.4, glucose 170, and BUN <5, ETOH [id number], Salicylate <7, Tylenol <10, WBC 10.4, H/H 11.1/33.7 and platelets 428 (compared to old labs platelets have been up to 436 1 year ago and H/H is stable compared to labs 6 months and 1 year ago), A1c 6.5, Beta HCG <5, respiratory panel negative, TSH 2.491, Lipid panel WNL.   Patient does not meet criteria for MDD at this time and symptoms seem more c/w either an alcohol induced mood d/o or adjustment d/o with mixed mood. She will be placed on CIWA monitoring for signs of withdrawal with MVI and thiamine replacement and with Ativan '1mg'$  for CIWA >10. She is receiving K+ supplement with plans to recheck CMP tomorrow for trending. She requests to restart Celexa and r/b/se/a to  med were discussed and she will be started on '10mg'$  daily. Her home T2DM medications will be verified and restarted and she has SSI coverage if needed with FSBS tid with meals. She reported BV symptoms to NP and will be started on Flagyl and one time dose of diflucan. She was encouraged to consider options for SAIOP or residential rehab after discharge.   I certify that inpatient services furnished can reasonably be expected to improve the patient's condition.   Harlow Asa, MD, FAPA 11/08/2020, 3:44 PM

## 2020-11-07 NOTE — ED Provider Notes (Signed)
Patient was medically reassessed and is stable for transport safe transport to [facility] for further evaluation   Wyvonnia Dusky, MD 11/07/20 1456

## 2020-11-07 NOTE — ED Notes (Signed)
Patient refused to have blood draw.

## 2020-11-07 NOTE — ED Notes (Signed)
TTS in progress 

## 2020-11-07 NOTE — Consult Note (Signed)
Telepsych Consultation   Reason for Consult:  Psych consult  Referring Physician:  Orpah Greek, MD Location of Patient: [facility] Location of Provider: [facility]  Patient Identification: Grace Montgomery MRN:  [id number] Principal Diagnosis: Suicidal ideation Diagnosis:  Principal Problem:   Suicidal ideation Active Problems:   Major depressive disorder   Total Time spent with patient: 30 minutes  Subjective:   Grace Montgomery is a 30 y.o. female patient admitted with reports of suicidal and homicidal ideations.  Patient seen via tele health by this provider; chart reviewed and consulted with Dr. Dwyane Dee on 11/07/20. On evaluation Grace Montgomery is lying down in  a lateral position on the bed facing away from the camera with minimum eye contact. She is noted to be irritable throughout the assessment and vaguely responds to questions. Her thought process is logical and speech is coherent.  When asked what brought her to the hospital to be evaluated, she states "a lot."  When asked to further elaborate, she states, "I do not know how to explain."  When asked if she is here for depression and suicidal ideations, she states, "yes."  She reports being depressed for the past 7 months. When asked to describe her depressive symptoms, she reports sadness, decreased energy level, weight loss, crying spells, irritability, and a lack of interest and pleasure in activities. She reports poor sleep but states she sleeps about 6 hours on average. She reports having a poor appetite. She reports feeling suicidal with no plan and is unable to contract for safety. When asked to elaborate about feeling suicidal, she states, "I do not feel like it."  She reports homicidal ideations towards her sister, and states "I am going to slice her throat, I cannot figure out how to get her off my lease."  She reports that her sister and an unknown man lives with her and she wants them out. She denies auditory and visual  hallucinations. She does not appear to be responding to internal or external stimuli. She reports drinking alcohol every weekend but is unable to quantify how much she drinks. Patient's BAL on arrival was 298. She reports a previous suicide attempt and hospitalization in the past. Per chart reviewed the patient was hospitalized at [facility] in 2020.    HPI: Patient presented to the [facility] Emergency Department for an evaluation of suicidal and homicidal ideations. Patient stated that she did not want to be on this earth anymore. Patient stated that she does not have a support system. Patient stated that she wished that movie, the purge was real because she had a list (where you are allowed to kill people for 1 day a year).  Past Psychiatric History: Hx of MDD, prior suicide attempt, hospitalization in 2020.  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   Marijuana use 10/26/2018   Maternal morbid obesity, 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[place]) 06/29/2011   MDD (major  depressive disorder), recurrent severe, 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/16/2017   Rh negative state in antepartum period 03/02/2018   Will need rho gam   S/P emergency cesarean section 06/18/2018   Seasonal allergies 06/29/2011   On zyrtec OTC     Suicidal behavior    2013, 2018   Supervision of high risk pregnancy, antepartum 01/12/2018    Nursing Staff Provider Office Location  [facility] Dating  [place] and 9.1 week [place] 12/08/18 Language   english Anatomy US   02/13/18 Flu Vaccine  Declined 01/12/18 Genetic Screen  NIPS: low risk  AFP: neg   TDaP vaccine   04/19/2018 Hgb A1C or  GTT Early  Third trimester  Rhogam  04/19/2018   LAB RESULTS  Feeding Plan both Blood Type --/--/A NEG (03/13 0947) A NEG  Contraception  condoms Antibody POS (03   Tobacco use 09/18/2018   Type 2 diabetes mellitus complicating pregnancy in third trimester, antepartum 01/05/2018   Current Diabetic Medications:  Insulin  '[x]'$  Aspirin 81 mg daily after 12 weeks (? A2/B GDM)  For A2/B GDM or higher classes of DM '[x]'$  Diabetes Education and Testing Supplies '[x]'$  Nutrition Counsult '[ ]'$  Fetal ECHO after 20 weeks - ordered 12/26 '[ ]'$  Eye exam for retina evaluation   Baseline and surveillance labs (pulled in from [facility], refresh links as needed)  Lab Results Component Value Date  CREATIN    Past Surgical History:  Procedure Laterality Date   CESAREAN SECTION N/A 06/19/2018   Procedure: CESAREAN SECTION;  Surgeon: Osborne Oman, MD;  Location: [facility] ORS;  Service: Obstetrics;  Laterality: N/A;   NO PAST SURGERIES     Family History:  Family History  Problem Relation Age of Onset   Sickle cell anemia Mother    Depression Mother    Diabetes Mother    Asthma Mother    Diabetes Other        grandparents and aunts/uncles   Stroke Other        grandparent   Alcohol abuse Father    Depression Father    Alcohol abuse Brother    Drug abuse Brother    Family Psychiatric  History: "all of them are crazy." Social History: "I drink alcohol every weekend." Social History   Substance and Sexual Activity  Alcohol Use Not Currently   Comment: ocassionally     Social History   Substance and Sexual Activity  Drug Use Not Currently   Comment: History of Marijuana - a few ago from  February 16, 2019    Social History   Socioeconomic History   Marital status: Single    Spouse name: Not on file   Number of children: 4   Years of education: 11   Highest education  level: 11th grade  Occupational History   Occupation: [employer]  Tobacco Use   Smoking status: Some Days    Packs/day: 0.25    Types: Cigarettes   Smokeless tobacco: Never  Vaping Use   Vaping Use: Never used  Substance and Sexual Activity   Alcohol use: Not Currently    Comment: ocassionally   Drug use: Not Currently    Comment: History of Marijuana - a few ago from  February 16, 2019   Sexual activity: Not Currently    Partners: Male    Birth control/protection: None  Other Topics Concern   Not on file  Social History Narrative   09/2018 Living independently in subsidized apartment    FOB lives nearby to  patient.  He helps some with child care.      Patient has 3 Children    Friend assists with transportation   Khaia Seaton would make decisions for pt if she were unable   Ms Permann reports feeling safe in her relationships   Social Determinants of Health   Financial Resource Strain: Not on file  Food Insecurity: Not on file  Transportation Needs: Not on file  Physical Activity: Not on file  Stress: Not on file  Social Connections: Not on file   Additional Social History:    Allergies:   Allergies  Allergen Reactions   Monistat [Tioconazole] Swelling   Chocolate Itching and Other (See Comments)    Ears & throat itch   Ibuprofen Swelling    Swelling of the feet   Banana Itching and Other (See Comments)    Throat itching    Labs:  Results for orders placed or performed during the hospital encounter of 11/07/20 (from the past 48 hour(s))  Rapid urine drug screen (hospital performed)     Status: Abnormal   Collection Time: 11/07/20  2:49 AM  Result Value Ref Range   Opiates NONE DETECTED NONE DETECTED   Cocaine NONE DETECTED NONE DETECTED   Benzodiazepines NONE DETECTED NONE DETECTED   Amphetamines NONE DETECTED NONE DETECTED   Tetrahydrocannabinol POSITIVE (A) NONE DETECTED   Barbiturates NONE DETECTED NONE DETECTED    Comment: (NOTE) DRUG SCREEN FOR  MEDICAL PURPOSES ONLY.  IF CONFIRMATION IS NEEDED FOR ANY PURPOSE, NOTIFY LAB WITHIN 5 DAYS.  LOWEST DETECTABLE LIMITS FOR URINE DRUG SCREEN Drug Class                     Cutoff (ng/mL) Amphetamine and metabolites    1000 Barbiturate and metabolites    200 Benzodiazepine                 [id number] Tricyclics and metabolites     300 Opiates and metabolites        300 Cocaine and metabolites        300 THC                            50 Performed at [facility], [place] [address], [place], [place] [address]   Comprehensive metabolic panel     Status: Abnormal   Collection Time: 11/07/20  2:51 AM  Result Value Ref Range   Sodium 141 135 - 145 mmol/L   Potassium 3.4 (L) 3.5 - 5.1 mmol/L   Chloride 107 98 - 111 mmol/L   CO2 22 22 - 32 mmol/L   Glucose, Bld 170 (H) 70 - 99 mg/dL    Comment: Glucose reference range applies only to samples taken after fasting for at least 8 hours.   BUN <5 (L) 6 - 20 mg/dL   Creatinine, Ser 0.90 0.44 - 1.00 mg/dL   Calcium 9.6 8.9 - 10.3 mg/dL   Total Protein 7.4 6.5 - 8.1 g/dL   Albumin 3.7 3.5 - 5.0 g/dL   AST 18 15 - 41 U/L   ALT 15 0 - 44 U/L   Alkaline Phosphatase 60 38 - 126 U/L   Total Bilirubin 0.4 0.3 - 1.2 mg/dL   GFR, Estimated >60 >60 mL/min    Comment: (NOTE) Calculated using the CKD-EPI Creatinine Equation (2021)    Anion gap 12 5 - 15    Comment: Performed at [facility]  Lab, [address] [place], [place] [address]  Ethanol     Status: Abnormal   Collection Time: 11/07/20  2:51 AM  Result Value Ref Range   Alcohol, Ethyl (B) 298 (H) <10 mg/dL    Comment: (NOTE) Lowest detectable limit for serum alcohol is 10 mg/dL.  For medical purposes only. Performed at [facility], [place] [address], [place], [place] [id number]   Salicylate level     Status: Abnormal   Collection Time: 11/07/20  2:51 AM  Result Value Ref Range   Salicylate Lvl Q000111Q (L) 7.0 - 30.0 mg/dL    Comment: Performed at [facility]  [address], [place], [place] [address]  Acetaminophen level     Status: Abnormal   Collection Time: 11/07/20  2:51 AM  Result Value Ref Range   Acetaminophen (Tylenol), Serum <10 (L) 10 - 30 ug/mL    Comment: (NOTE) Therapeutic concentrations vary significantly. A range of 10-30 ug/mL  may be an effective concentration for many patients. However, some  are best treated at concentrations outside of this range. Acetaminophen concentrations >150 ug/mL at 4 hours after ingestion  and >50 ug/mL at 12 hours after ingestion are often associated with  toxic reactions.  Performed at [facility], [place] [address] [place], [place] [address]   cbc     Status: Abnormal   Collection Time: 11/07/20  2:51 AM  Result Value Ref Range   WBC 10.4 4.0 - 10.5 K/uL   RBC 3.97 3.87 - 5.11 MIL/uL   Hemoglobin 11.1 (L) 12.0 - 15.0 g/dL   HCT 33.7 (L) 36.0 - 46.0 %   MCV 84.9 80.0 - 100.0 fL   MCH 28.0 26.0 - 34.0 pg   MCHC 32.9 30.0 - 36.0 g/dL   RDW 17.1 (H) 11.5 - 15.5 %   Platelets 428 (H) 150 - 400 K/uL   nRBC 0.0 0.0 - 0.2 %    Comment: Performed at [facility], [place] [address], [place], [place] [address]  Hemoglobin A1c     Status: Abnormal   Collection Time: 11/07/20  2:51 AM  Result Value Ref Range   Hgb A1c MFr Bld 6.5 (H) 4.8 - 5.6 %    Comment: (NOTE) Pre diabetes:          5.7%-6.4%  Diabetes:              >6.4%  Glycemic control for   <7.0% adults with diabetes    Mean Plasma Glucose 139.85 mg/dL    Comment: Performed at [facility] [address], [place], [place] [address]  I-Stat beta hCG blood, ED     Status: None   Collection Time: 11/07/20  2:59 AM  Result Value Ref Range   I-stat hCG, quantitative <5.0 <5 mIU/mL   Comment 3            Comment:   GEST. AGE      CONC.  (mIU/mL)   <=1 WEEK        5 - 50     2 WEEKS       50 - 500     3 WEEKS       100 - 10,000     4 WEEKS     1,000 - 30,000        FEMALE AND NON-PREGNANT FEMALE:     LESS THAN 5 mIU/mL   CBG  monitoring, ED  Status: Abnormal   Collection Time: 11/07/20  3:28 AM  Result Value Ref Range   Glucose-Capillary 118 (H) 70 - 99 mg/dL    Comment: Glucose reference range applies only to samples taken after fasting for at least 8 hours.  Resp Panel by RT-PCR (Flu A&B, Covid) Nasopharyngeal Swab     Status: None   Collection Time: 11/07/20  3:49 AM   Specimen: Nasopharyngeal Swab; Nasopharyngeal(NP) swabs in vial transport medium  Result Value Ref Range   SARS Coronavirus 2 by RT PCR NEGATIVE NEGATIVE    Comment: (NOTE) SARS-CoV-2 target nucleic acids are NOT DETECTED.  The SARS-CoV-2 RNA is generally detectable in upper respiratory specimens during the acute phase of infection. The lowest concentration of SARS-CoV-2 viral copies this assay can detect is 138 copies/mL. A negative result does not preclude SARS-Cov-2 infection and should not be used as the sole basis for treatment or other patient management decisions. A negative result may occur with  improper specimen collection/handling, submission of specimen other than nasopharyngeal swab, presence of viral mutation(s) within the areas targeted by this assay, and inadequate number of viral copies(<138 copies/mL). A negative result must be combined with clinical observations, patient history, and epidemiological information. The expected result is Negative.  Fact Sheet for Patients:  EntrepreneurPulse.com.au  Fact Sheet for Healthcare Providers:  IncredibleEmployment.be  This test is no t yet approved or cleared by the [place] FDA and  has been authorized for detection and/or diagnosis of SARS-CoV-2 by FDA under an Emergency Use Authorization (EUA). This EUA will remain  in effect (meaning this test can be used) for the duration of the COVID-19 declaration under Section 564(b)(1) of the Act, 21 U.S.C.section 360bbb-3(b)(1), unless the authorization is terminated  or revoked sooner.        Influenza A by PCR NEGATIVE NEGATIVE   Influenza B by PCR NEGATIVE NEGATIVE    Comment: (NOTE) The Xpert Xpress SARS-CoV-2/FLU/RSV plus assay is intended as an aid in the diagnosis of influenza from Nasopharyngeal swab specimens and should not be used as a sole basis for treatment. Nasal washings and aspirates are unacceptable for Xpert Xpress SARS-CoV-2/FLU/RSV testing.  Fact Sheet for Patients: EntrepreneurPulse.com.au  Fact Sheet for Healthcare Providers: IncredibleEmployment.be  This test is not yet approved or cleared by the [place] FDA and has been authorized for detection and/or diagnosis of SARS-CoV-2 by FDA under an Emergency Use Authorization (EUA). This EUA will remain in effect (meaning this test can be used) for the duration of the COVID-19 declaration under Section 564(b)(1) of the Act, 21 U.S.C. section 360bbb-3(b)(1), unless the authorization is terminated or revoked.  Performed at [facility], [place] [address] [place], [place] [address]   CBG monitoring, ED     Status: Abnormal   Collection Time: 11/07/20  9:05 AM  Result Value Ref Range   Glucose-Capillary 101 (H) 70 - 99 mg/dL    Comment: Glucose reference range applies only to samples taken after fasting for at least 8 hours.   Comment 1 Notify RN    Comment 2 Document in Chart     Medications:  Current Facility-Administered Medications  Medication Dose Route Frequency Provider Last Rate Last Admin   empagliflozin (JARDIANCE) tablet 25 mg  25 mg Oral Daily Pollina, Gwenyth Allegra, MD   25 mg at 11/07/20 1015   insulin aspart (novoLOG) injection 0-15 Units  0-15 Units Subcutaneous TID WC Pollina, Gwenyth Allegra, MD       metFORMIN (GLUCOPHAGE) tablet 1,000  mg  1,000 mg Oral BID WC Orpah Greek, MD   1,000 mg at 11/07/20 1030   Current Outpatient Medications  Medication Sig Dispense Refill   empagliflozin (JARDIANCE) 25 MG TABS tablet Take 1  tablet (25 mg total) by mouth daily. 90 tablet 3   ibuprofen (ADVIL) 200 MG tablet Take 400 mg by mouth every 6 (six) hours as needed for headache or moderate pain.     metFORMIN (GLUCOPHAGE) 1000 MG tablet Take 1 tablet (1,000 mg total) by mouth 2 (two) times daily with a meal. 180 tablet 3   Multiple Vitamins-Minerals (WOMENS MULTIVITAMIN PO) Take 1 tablet by mouth daily.     metroNIDAZOLE (FLAGYL) 500 MG tablet Take 1 tablet (500 mg total) by mouth 2 (two) times daily. (Patient not taking: Reported on 11/07/2020) 14 tablet 0    Psychiatric Specialty Exam:  Presentation  General Appearance: Appropriate for Environment  Eye Contact:Minimal  Speech:Clear and Coherent  Speech Volume:Increased  Handedness:Right   Mood and Affect  Mood:Irritable; Depressed  Affect:Congruent   Thought Process  Thought Processes:Coherent  Descriptions of Associations:Intact  Orientation:Full (Time, Place and Person)  Thought Content:WDL  History of Schizophrenia/Schizoaffective disorder:No data recorded Duration of Psychotic Symptoms:No data recorded Hallucinations:Hallucinations: None  Ideas of Reference:None  Suicidal Thoughts:Suicidal Thoughts: Yes, Active  Homicidal Thoughts:Homicidal Thoughts: Yes, Active   Sensorium  Memory:Immediate Fair; Recent Fair; Remote Fair  Judgment:Fair  Insight:Fair   Executive Functions  Concentration:Fair  Attention Span:Fair  [place]   Psychomotor Activity  Psychomotor Activity:Psychomotor Activity: Normal   Assets  Assets:Communication Skills; Desire for Improvement; Housing; Leisure Time; Physical Health; Social Support   Sleep  Sleep:Sleep: Fair Number of Hours of Sleep: 6    Physical Exam: Physical Exam Cardiovascular:     Rate and Rhythm: Tachycardia present.  Neurological:     Mental Status: She is alert.   Review of Systems  Constitutional: Negative.   HENT:  Negative.    Eyes: Negative.   Respiratory: Negative.    Cardiovascular: Negative.   Gastrointestinal: Negative.   Genitourinary: Negative.   Musculoskeletal: Negative.   Skin: Negative.   Neurological: Negative.   Endo/Heme/Allergies: Negative.   Psychiatric/Behavioral:  Positive for depression, substance abuse and suicidal ideas.   Blood pressure (!) 140/91, pulse (!) 114, temperature 98.5 F (36.9 C), temperature source Oral, resp. rate 20, height '5\' 4"'$  (1.626 m), weight 100 kg, last menstrual period 10/08/2020, SpO2 98 %. Body mass index is 37.84 kg/m.  Treatment Plan Summary: Daily contact with patient to assess and evaluate symptoms and progress in treatment, Medication management, and Plan Patient is recommended for inpatient treatment.  Disposition: Recommend psychiatric Inpatient admission when medically cleared.  This service was provided via telemedicine using a 2-way, interactive audio and video technology.  Names of all persons participating in this telemedicine service and their role in this encounter. Name: Earley Brooke  Role: Patient   Name: Darrol Angel  Role: NP  Name:  Role:   Name:  Role:    A secure chat sent to Benjamine Mola, [place] and psychiatry team with the stated disposition.   Purnell Daigle L, NP 11/07/2020 11:10 AM

## 2020-11-07 NOTE — ED Provider Notes (Addendum)
Emergency Medicine Observation Re-evaluation Note  Grace Montgomery is a 30 y.o. female, seen on rounds today.  Pt initially presented to the ED for complaints of Suicidal Currently, the patient is lying on the stretcher complaining that her head hurts from all the dinning.  Physical Exam  BP (!) 140/91 (BP Location: Left Arm)   Pulse (!) 114   Temp 98.5 F (36.9 C) (Oral)   Resp 20   Ht '5\' 4"'$  (1.626 m)   Wt 100 kg   LMP 10/08/2020   SpO2 98%   BMI 37.84 kg/m  Physical Exam General: nad Lungs: no distress Psych: Not intoxicated. Alert, irritable  ED Course / MDM  EKG:   I have reviewed the labs performed to date as well as medications administered while in observation.  Recent changes in the last 24 hours include none.  Plan  Current plan is for TTS c/s.  Grace Montgomery is under involuntary commitment.  She is medically clear for psychiatric disposition.     Arnaldo Natal, MD 11/07/20 1128    Arnaldo Natal, MD 11/07/20 1130    Arnaldo Natal, MD 11/07/20 [phone]

## 2020-11-07 NOTE — BH Assessment (Addendum)
@  0744, requested patient's nurse Benjamine Mola, RN to place the TTS machine in patient's room.  '@0747'$ , Benjamine Mola, RN agreeable to placing the machine in patient's room.

## 2020-11-08 DIAGNOSIS — F4323 Adjustment disorder with mixed anxiety and depressed mood: Secondary | ICD-10-CM | POA: Diagnosis present

## 2020-11-08 DIAGNOSIS — Z87898 Personal history of other specified conditions: Secondary | ICD-10-CM

## 2020-11-08 DIAGNOSIS — F332 Major depressive disorder, recurrent severe without psychotic features: Secondary | ICD-10-CM

## 2020-11-08 HISTORY — DX: Personal history of other specified conditions: Z87.898

## 2020-11-08 HISTORY — DX: Adjustment disorder with mixed anxiety and depressed mood: F43.23

## 2020-11-08 LAB — GLUCOSE, CAPILLARY
Glucose-Capillary: 129 mg/dL — ABNORMAL HIGH (ref 70–99)
Glucose-Capillary: 102 mg/dL — ABNORMAL HIGH (ref 70–99)
Glucose-Capillary: 105 mg/dL — ABNORMAL HIGH (ref 70–99)
Glucose-Capillary: 104 mg/dL — ABNORMAL HIGH (ref 70–99)

## 2020-11-08 LAB — LIPID PANEL
Cholesterol: 124 mg/dL (ref 0–200)
HDL: 72 mg/dL (ref >40)
LDL Cholesterol: 38 mg/dL (ref 0–99)
Total CHOL/HDL Ratio: 1.7 RATIO
Triglycerides: 72 mg/dL (ref <150)
VLDL: 14 mg/dL (ref 0–40)

## 2020-11-08 LAB — TSH: TSH: 2.491 u[IU]/mL (ref 0.350–4.500)

## 2020-11-08 MED ORDER — ONDANSETRON 4 MG PO TBDP: 4.0000 mg | ORAL_TABLET | Freq: Four times a day (QID) | ORAL | Status: DC | PRN

## 2020-11-08 MED ORDER — LORAZEPAM 1 MG PO TABS: 1.0000 mg | ORAL_TABLET | Freq: Four times a day (QID) | ORAL | Status: DC | PRN

## 2020-11-08 MED ORDER — FLUCONAZOLE 150 MG PO TABS
150.0000 mg | ORAL_TABLET | Freq: Once | ORAL | Status: AC
Start: 2020-11-08 — End: 2020-11-08
  Administered 2020-11-08: 150 mg via ORAL
  Filled 2020-11-08: qty 1

## 2020-11-08 MED ORDER — THIAMINE HCL 100 MG PO TABS
100.0000 mg | ORAL_TABLET | Freq: Every day | ORAL | Status: DC
  Administered 2020-11-09 – 2020-11-10 (×2): 100 mg via ORAL
  Filled 2020-11-08 (×4): qty 1

## 2020-11-08 MED ORDER — POTASSIUM CHLORIDE CRYS ER 20 MEQ PO TBCR
40.0000 meq | EXTENDED_RELEASE_TABLET | Freq: Once | ORAL | Status: AC
  Administered 2020-11-08: 40 meq via ORAL
  Filled 2020-11-08 (×2): qty 2

## 2020-11-08 MED ORDER — ADULT MULTIVITAMIN W/MINERALS CH
1.0000 | ORAL_TABLET | Freq: Every day | ORAL | Status: DC
  Administered 2020-11-08 – 2020-11-10 (×3): 1 via ORAL
  Filled 2020-11-08 (×6): qty 1

## 2020-11-08 MED ORDER — LOPERAMIDE HCL 2 MG PO CAPS: 2.0000 mg | ORAL_CAPSULE | ORAL | Status: DC | PRN

## 2020-11-08 MED ORDER — METRONIDAZOLE 500 MG PO TABS
500.0000 mg | ORAL_TABLET | Freq: Two times a day (BID) | ORAL | Status: DC
Start: 2020-11-08 — End: 2020-11-10
  Administered 2020-11-08 – 2020-11-10 (×5): 500 mg via ORAL
  Filled 2020-11-08 (×9): qty 1

## 2020-11-08 MED ORDER — CITALOPRAM HYDROBROMIDE 10 MG PO TABS
10.0000 mg | ORAL_TABLET | Freq: Every day | ORAL | Status: DC
  Administered 2020-11-08 – 2020-11-10 (×3): 10 mg via ORAL
  Filled 2020-11-08 (×5): qty 1

## 2020-11-08 NOTE — Progress Notes (Signed)
Progress note    11/08/20 0746  Psych Admission Type (Psych Patients Only)  Admission Status Involuntary  Psychosocial Assessment  Patient Complaints Anxiety  Eye Contact Fair  Facial Expression Animated;Anxious;Pensive  Affect Anxious;Appropriate to circumstance  Speech Logical/coherent  Interaction Assertive  Motor Activity Slow  Appearance/Hygiene In hospital gown  Behavior Characteristics Cooperative;Appropriate to situation;Anxious  Mood Anxious;Pleasant  Thought Process  Coherency Concrete thinking  Content WDL  Delusions WDL  Perception WDL  Hallucination None reported or observed  Judgment Poor  Confusion None  Danger to Self  Current suicidal ideation? Denies  Danger to Others  Danger to Others None reported or observed

## 2020-11-08 NOTE — Progress Notes (Signed)
Pt visible up in her room, pt stated she was feeling better, just bored about having to stay in the room all of the time. Pt given PRN Trazodone per MAR with HS medication    11/08/20 2200  Psych Admission Type (Psych Patients Only)  Admission Status Involuntary  Psychosocial Assessment  Patient Complaints Anxiety  Eye Contact Fair  Facial Expression Animated;Anxious;Pensive  Affect Anxious;Appropriate to circumstance  Speech Logical/coherent  Interaction Assertive  Motor Activity Slow  Appearance/Hygiene In hospital gown  Behavior Characteristics Cooperative  Mood Pleasant;Anxious  Thought Process  Coherency Concrete thinking  Content WDL  Delusions WDL  Perception WDL  Hallucination None reported or observed  Judgment Poor  Confusion None  Danger to Self  Current suicidal ideation? Denies  Danger to Others  Danger to Others None reported or observed

## 2020-11-08 NOTE — [facility] Group Notes (Signed)
[place] Group Notes:  (Nursing/MHT/Case Management/Adjunct)  Date:  11/08/2020  Time:  9:14 AM  Type of Therapy:  GoalsGroup Therapy  Participation Level:  Active  Participation Quality:  Appropriate  Affect:  Appropriate  Cognitive:  Appropriate  Insight:  Appropriate  Engagement in Group:  Engaged  Modes of Intervention:  Clarification  Summary of Progress/Problems: Pt stated goal is to go home. Katherina Right 11/08/2020, 9:14 AM

## 2020-11-08 NOTE — Plan of Care (Signed)
  Problem: Coping: Goal: Coping ability will improve Outcome: Progressing Goal: Will verbalize feelings Outcome: Progressing   Problem: Health Behavior/Discharge Planning: Goal: Ability to make decisions will improve Outcome: Progressing   

## 2020-11-08 NOTE — Progress Notes (Addendum)
   11/07/20 2100  Psych Admission Type (Psych Patients Only)  Admission Status Involuntary  Psychosocial Assessment  Patient Complaints None  Eye Contact Brief  Facial Expression Anxious  Affect Appropriate to circumstance  Speech Logical/coherent  Interaction Assertive  Motor Activity Other (Comment) (wnl)  Appearance/Hygiene Unremarkable  Behavior Characteristics Cooperative;Anxious  Mood Anxious  Thought Process  Coherency WDL  Content WDL  Delusions None reported or observed  Perception WDL  Hallucination None reported or observed  Judgment Impaired  Confusion None  Danger to Self  Current suicidal ideation? Denies  Danger to Others  Danger to Others None reported or observed   Pt denies SI, HI, AVH and pain. Pt also denies anxiety and depression. Pt is concerned about a possible vaginal infection. Pt says she just finished her menstrual cycle and has noticed irritation and itching in the vaginal area. Denies swelling, pain and discharge. Endorses a foul odor. Says she is prone to bacterial vaginosis. "They usually give me Flagyl for it." Notified provider of situation. Pt denies any other issues.

## 2020-11-08 NOTE — Group Note (Signed)
Clinical Social Work Note  No group could be held today due to a COVID-19 outbreak on the unit.  An educational packet about anger management was provided to the patient to work on individually.  Selmer Dominion, LCSW 11/08/2020, 8:55 AM
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
## 2020-11-08 NOTE — [facility] Suicide Risk Assessment (Signed)
[facility] INPATIENT:  Family/Significant Other Suicide Prevention Education  Suicide Prevention Education:  Patient Refusal for Family/Significant Other Suicide Prevention Education: The patient RACQUELLE EALES has refused to provide written consent for family/significant other to be provided Family/Significant Other Suicide Prevention Education during admission and/or prior to discharge.  Physician notified.  Ervine Witucki A Kiwana Deblasi 11/08/2020, 1:44 PM
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
## 2020-11-09 DIAGNOSIS — F332 Major depressive disorder, recurrent severe without psychotic features: Secondary | ICD-10-CM | POA: Diagnosis not present

## 2020-11-09 LAB — GLUCOSE, CAPILLARY
Glucose-Capillary: 103 mg/dL — ABNORMAL HIGH (ref 70–99)
Glucose-Capillary: 93 mg/dL (ref 70–99)
Glucose-Capillary: 127 mg/dL — ABNORMAL HIGH (ref 70–99)
Glucose-Capillary: 97 mg/dL (ref 70–99)

## 2020-11-09 NOTE — Progress Notes (Signed)
EKG completed and placed on pt's chart. Results: "Normal sinus rhythm with sinus arrhythmia. Normal ECG."

## 2020-11-09 NOTE — [facility] Group Notes (Signed)
The focus of this group is to help patients establish daily goals to achieve during treatment and discuss how the patient can incorporate goal setting into their daily lives to aide in recovery.  Pt attended group and stated that her goal is to " See if I can get home."

## 2020-11-09 NOTE — Progress Notes (Addendum)
Pt refused insulin coverage, for BS 127, states, "I don't take insulin." Pt was educated about her sliding scale and the insulin orders/medication, pt continues, "I don't care what the doctor ordered. I don't take no insulin."

## 2020-11-09 NOTE — Progress Notes (Signed)
Patient reports having had a good day. She reports being in her room sleeping a lot today because she didn't have a lot to do since not being able to go to the dayroom. She reports reading a book she has in her room to take up the time. She reports being ready to discharge and is not sure why she is still here. Writer encouraged her to speak with er doctor who should be able to inform her of her discharge date. Support given and safety maintained on unit with 15 min checks.

## 2020-11-09 NOTE — Group Note (Signed)
Clinical Social Work Note  Group was not held in person due to COVID-19 outbreak and isolation requirements on the unit. CSW provided handouts on building healthy supports to work on individually in rooms.  Selmer Dominion, LCSW 11/09/2020, 12:19 PM

## 2020-11-09 NOTE — Progress Notes (Signed)
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 Prior to admission, she states she had been arguing with her sister over their lease on their apartment and belief that her sister is not helping pay the bills. She states she was "stressed out" and called the police in an effort to be taken to the hospital to avoid "hurting her" and due to homicidal thoughts toward her sister. She denies making suicidal statements and denies current SI or HI today. She states she is frustrated and irritable because of psychosocial stressors, and she wants to find a new place to stay when she leaves. She denies prior suicide attempts but states she was previously on Celexa for help with irritability and mood and would like to be back on this medication. She minimizes her recent drinking when confronted with the fact that her BAL was 298 on arrival to the ED. She admits she has a DUI but states that she only drinks on weekends and will not quantify the pattern or amount of her recent alcohol use. She denies current signs of withdrawal and denies other drug use. She states that she does not feel depressed and denies issues recently with sleep, appetite, energy, focus, or anhedonia. She denies h/o AVH, psychosis, or mania/hypomania. She was admitted to [facility] for crisis stabilization and medication management.   Daily Note: Patient is seen and evaluated, chart reviewed and case discussed with the treatment team. Patient is calm, cooperative and pleasant on approach. She is lying in her bed, reading a book. Patient denies SI/HI/AVH, paranoia and delusions. She does not appear to be responding to internal stimuli. She stated she is over the whole situation at home that had her so irritated. She  stated she is ready to go home because she needs to pay her bills and look for another place to live. She denies alcohol withdrawal symptoms and minimizes her alcohol use. Her CIWA score today is 0. She denies depression and anxiety and stated she was not depressed or anxious when she came to the hospital. She is taking her Celexa and has no complaints of side effects. She stated her vaginal pain feels much better today, she is taking the Flagyl as prescribed. She reported good sleep, record shows she slept  6.5 hours last night. She reported an okay appetite, she stated "the food is not that good here." She is hopeful to be discharged tomorrow. She is able to contract for safety on the unit. When asked who could be called for safety planning or collateral she stated "there is no one really because I do all of this out here on my own."   Labs ordered for tomorrow: Repeat Covid test, CMP to recheck potassium. No medication changes today.   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, Alcohol use disorder  Past Medical History:  Past Medical History:  Diagnosis Date  Acute post-traumatic headache 06/09/2019   See 06/09/19 ED visit note   Anxiety    Asthma, mild persistent 06/29/2011   Bipolar affective disorder ([place]) 09/22/2018   Cannabis dependence    Carpal tunnel syndrome of left wrist 02/16/2019   Concussion 06/09/2019   See ED visit note 06/09/19   Diabetes mellitus without complication ([place])    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   Marijuana use 10/26/2018   Maternal morbid obesity,  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[place]) 06/29/2011   MDD (major depressive disorder), recurrent severe, without psychosis ([place]) 02/16/2017   Rh negative state in antepartum period 03/02/2018   Will need rho gam   S/P emergency cesarean section 06/18/2018   Seasonal allergies 06/29/2011   On zyrtec OTC     Suicidal behavior    2013, 2018   Supervision of high risk pregnancy, antepartum 01/12/2018    Nursing Staff Provider Office Location  [facility] Dating  [place] and 9.1 week [place] 12/08/18 Language  english Anatomy US   02/13/18 Flu Vaccine  Declined 01/12/18 Genetic Screen  NIPS: low risk  AFP: neg   TDaP vaccine   04/19/2018 Hgb A1C or  GTT Early  Third trimester  Rhogam  04/19/2018   LAB RESULTS  Feeding Plan both Blood Type --/--/A NEG (03/13 0947) A NEG  Contraception  condoms Antibody POS (03   Tobacco use 09/18/2018   Type 2 diabetes mellitus complicating pregnancy in third trimester, antepartum 01/05/2018   Current Diabetic Medications:  Insulin  '[x]'$  Aspirin 81 mg daily after 12 weeks (? A2/B GDM)  For A2/B GDM or higher classes of DM '[x]'$  Diabetes Education and Testing Supplies '[x]'$  Nutrition Counsult '[ ]'$  Fetal ECHO after 20 weeks - ordered 12/26 '[ ]'$  Eye exam for retina evaluation   Baseline and surveillance labs (pulled in from [facility], refresh links as needed)  Lab Results Component Value Date  CREATIN    Past Surgical History:  Procedure Laterality Date   CESAREAN SECTION N/A 06/19/2018   Procedure: CESAREAN SECTION;  Surgeon: Osborne Oman, MD;  Location: [facility] LD ORS;  Service: Obstetrics;  Laterality: N/A;   NO PAST SURGERIES     Family History:  Family History  Problem Relation Age of Onset   Sickle cell anemia Mother    Depression Mother    Diabetes Mother    Asthma Mother    Diabetes Other        grandparents and aunts/uncles   Stroke Other        grandparent   Alcohol abuse Father    Depression Father    Alcohol abuse Brother    Drug abuse Brother    Family Psychiatric  History: Brother with  schizophrenia, per patient Social History:  Social History   Substance and Sexual Activity  Alcohol Use Yes   Comment: "every other weekend"     Social History   Substance and Sexual Activity  Drug Use Not Currently   Comment: History of Marijuana - a few ago from  February 16, 2019    Social History   Socioeconomic History   Marital status: Single    Spouse name: Not on file   Number of children: 4   Years of education: 11   Highest education level: 11th grade  Occupational History   Occupation: [employer]  Tobacco Use   Smoking status: Some Days    Packs/day: 0.25    Types: Cigarettes   Smokeless tobacco: Never  Vaping Use  Vaping Use: Never used  Substance and Sexual Activity   Alcohol use: Yes    Comment: "every other weekend"   Drug use: Not Currently    Comment: History of Marijuana - a few ago from  February 16, 2019   Sexual activity: Not Currently    Partners: Male    Birth control/protection: None  Other Topics Concern   Not on file  Social History Narrative   09/2018 Living independently in subsidized apartment    FOB lives nearby to patient.  He helps some with child care.      Patient has 4 Children    Friend assists with transportation   Vivion Haugh would make decisions for pt if she were unable   Ms Badalamenti reports feeling safe in her relationships   Social Determinants of Health   Financial Resource Strain: Not on file  Food Insecurity: Not on file  Transportation Needs: Not on file  Physical Activity: Not on file  Stress: Not on file  Social Connections: Not on file   Additional Social History:     Sleep: Good  Appetite:  Fair  Current Medications: Current Facility-Administered Medications  Medication Dose Route Frequency Provider Last Rate Last Admin   acetaminophen (TYLENOL) tablet 650 mg  650 mg Oral Q6H PRN White, Patrice L, NP       alum & mag hydroxide-simeth (MAALOX/MYLANTA) 200-200-20 MG/5ML suspension 30 mL  30 mL Oral Q4H  PRN White, Patrice L, NP       citalopram (CELEXA) tablet 10 mg  10 mg Oral Daily Ethelene Hal, NP   10 mg at 11/09/20 0848   empagliflozin (JARDIANCE) tablet 25 mg  25 mg Oral Daily White, Patrice L, NP   25 mg at 11/09/20 0848   hydrOXYzine (ATARAX/VISTARIL) tablet 25 mg  25 mg Oral TID PRN France Ravens, MD       insulin aspart (novoLOG) injection 0-15 Units  0-15 Units Subcutaneous TID WC White, Patrice L, NP       loperamide (IMODIUM) capsule 2-4 mg  2-4 mg Oral PRN Ethelene Hal, NP       LORazepam (ATIVAN) tablet 1 mg  1 mg Oral Q6H PRN Ethelene Hal, NP       magnesium hydroxide (MILK OF MAGNESIA) suspension 30 mL  30 mL Oral Daily PRN White, Patrice L, NP       metFORMIN (GLUCOPHAGE) tablet 1,000 mg  1,000 mg Oral BID WC White, Patrice L, NP   1,000 mg at 11/09/20 0848   metroNIDAZOLE (FLAGYL) tablet 500 mg  500 mg Oral Q12H Ethelene Hal, NP   500 mg at 11/09/20 0848   multivitamin with minerals tablet 1 tablet  1 tablet Oral Daily Ethelene Hal, NP   1 tablet at 11/09/20 0848   ondansetron (ZOFRAN-ODT) disintegrating tablet 4 mg  4 mg Oral Q6H PRN Ethelene Hal, NP       thiamine tablet 100 mg  100 mg Oral Daily Ethelene Hal, NP   100 mg at 11/09/20 0848   traZODone (DESYREL) tablet 50 mg  50 mg Oral QHS PRN Bobbitt, Shalon E, NP   50 mg at 11/08/20 2114    Lab Results:  Results for orders placed or performed during the hospital encounter of 11/07/20 (from the past 48 hour(s))  Glucose, capillary     Status: Abnormal   Collection Time: 11/07/20  5:15 PM  Result Value Ref Range   Glucose-Capillary 128 (H) 70 -  99 mg/dL    Comment: Glucose reference range applies only to samples taken after fasting for at least 8 hours.  Glucose, capillary     Status: Abnormal   Collection Time: 11/07/20  8:52 PM  Result Value Ref Range   Glucose-Capillary 108 (H) 70 - 99 mg/dL    Comment: Glucose reference range applies only to samples taken  after fasting for at least 8 hours.   Comment 1 Notify RN    Comment 2 Document in Chart   Glucose, capillary     Status: Abnormal   Collection Time: 11/08/20  5:55 AM  Result Value Ref Range   Glucose-Capillary 129 (H) 70 - 99 mg/dL    Comment: Glucose reference range applies only to samples taken after fasting for at least 8 hours.  TSH     Status: None   Collection Time: 11/08/20  7:12 AM  Result Value Ref Range   TSH 2.491 0.350 - 4.500 uIU/mL    Comment: Performed by a 3rd Generation assay with a functional sensitivity of <=0.01 uIU/mL. Performed at [facility], [place] [address] [place], [place] [address]   Lipid panel     Status: None   Collection Time: 11/08/20  7:12 AM  Result Value Ref Range   Cholesterol 124 0 - 200 mg/dL   Triglycerides 72 <150 mg/dL   HDL 72 >40 mg/dL   Total CHOL/HDL Ratio 1.7 RATIO   VLDL 14 0 - 40 mg/dL   LDL Cholesterol 38 0 - 99 mg/dL    Comment:        Total Cholesterol/HDL:CHD Risk Coronary Heart Disease Risk Table                     Men   Women  1/2 Average Risk   3.4   3.3  Average Risk       5.0   4.4  2 X Average Risk   9.6   7.1  3 X Average Risk  23.4   11.0        Use the calculated Patient Ratio above and the CHD Risk Table to determine the patient's CHD Risk.        ATP III CLASSIFICATION (LDL):  <100     mg/dL   Optimal  100-129  mg/dL   Near or Above                    Optimal  130-159  mg/dL   Borderline  160-189  mg/dL   High  >190     mg/dL   Very High Performed at [place] [address] [place], [place] [address]   Glucose, capillary     Status: Abnormal   Collection Time: 11/08/20 11:58 AM  Result Value Ref Range   Glucose-Capillary 102 (H) 70 - 99 mg/dL    Comment: Glucose reference range applies only to samples taken after fasting for at least 8 hours.   Comment 1 Notify RN    Comment 2 Document in Chart   Glucose, capillary     Status: Abnormal   Collection Time:  11/08/20  5:01 PM  Result Value Ref Range   Glucose-Capillary 105 (H) 70 - 99 mg/dL    Comment: Glucose reference range applies only to samples taken after fasting for at least 8 hours.  Glucose, capillary     Status: Abnormal   Collection Time: 11/08/20  8:27 PM  Result Value Ref Range  Glucose-Capillary 104 (H) 70 - 99 mg/dL    Comment: Glucose reference range applies only to samples taken after fasting for at least 8 hours.  Glucose, capillary     Status: Abnormal   Collection Time: 11/09/20  5:31 AM  Result Value Ref Range   Glucose-Capillary 103 (H) 70 - 99 mg/dL    Comment: Glucose reference range applies only to samples taken after fasting for at least 8 hours.    Blood Alcohol level:  Lab Results  Component Value Date   ETH 298 (H) 11/07/2020   ETH <10 [id number]    Metabolic Disorder Labs: Lab Results  Component Value Date   HGBA1C 6.5 (H) 11/07/2020   MPG 139.85 11/07/2020   MPG 182.9 10/11/2018   No results found for: PROLACTIN Lab Results  Component Value Date   CHOL 124 11/08/2020   TRIG 72 11/08/2020   HDL 72 11/08/2020   CHOLHDL 1.7 11/08/2020   VLDL 14 11/08/2020   LDLCALC 38 11/08/2020   LDLCALC 35 09/21/2018    Physical Findings: AIMS: Facial and Oral Movements Muscles of Facial Expression: None, normal Lips and Perioral Area: None, normal Jaw: None, normal Tongue: None, normal,Extremity Movements Upper (arms, wrists, hands, fingers): None, normal Lower (legs, knees, ankles, toes): None, normal, Trunk Movements Neck, shoulders, hips: None, normal, Overall Severity Severity of abnormal movements (highest score from questions above): None, normal Incapacitation due to abnormal movements: None, normal Patient's awareness of abnormal movements (rate only patient's report): No Awareness, Dental Status Current problems with teeth and/or dentures?: No Does patient usually wear dentures?: No  CIWA:  CIWA-Ar Total: 0 COWS:      Musculoskeletal: Strength & Muscle Tone: within normal limits Gait & Station: normal Patient leans: N/A  Psychiatric Specialty Exam:  Presentation  General Appearance: Appropriate for Environment; Casual  Eye Contact:Good  Speech:Clear and Coherent; Normal Rate  Speech Volume:Normal  Handedness:Right   Mood and Affect  Mood:Depressed; Anxious  Affect:Congruent   Thought Process  Thought Processes:Coherent; Goal Directed; Linear  Descriptions of Associations:Intact  Orientation:Full (Time, Place and Person)  Thought Content:Logical  History of Schizophrenia/Schizoaffective disorder:No data recorded Duration of Psychotic Symptoms:No data recorded Hallucinations:Hallucinations: None  Ideas of Reference:None  Suicidal Thoughts:Suicidal Thoughts: No  Homicidal Thoughts:Homicidal Thoughts: No   Sensorium  Memory:Immediate Good; Recent Good; Remote Good  Judgment:Fair  Insight:Fair   Executive Functions  Concentration:Good  Attention Span:Good  [place] of Knowledge:Good  Language:Good   Psychomotor Activity  Psychomotor Activity:Psychomotor Activity: Normal   Assets  Assets:Communication Skills; Financial Resources/Insurance; Housing; Resilience; Social Support; Vocational/Educational   Sleep  Sleep:Sleep: Good Number of Hours of Sleep: 6.75    Physical Exam: Physical Exam Vitals and nursing note reviewed.  Constitutional:      Appearance: Normal appearance. She is obese.  HENT:     Head: Normocephalic and atraumatic.  Pulmonary:     Effort: Pulmonary effort is normal.  Musculoskeletal:        General: Normal range of motion.     Cervical back: Normal range of motion.  Neurological:     General: No focal deficit present.     Mental Status: She is alert and oriented to person, place, and time.  Psychiatric:        Attention and Perception: Attention normal. She does not perceive auditory or visual hallucinations.         Mood and Affect: Mood is anxious and depressed.        Speech: Speech normal.  Behavior: Behavior normal. Behavior is cooperative.        Thought Content: Thought content normal. Thought content is not paranoid or delusional. Thought content does not include homicidal or suicidal ideation. Thought content does not include homicidal or suicidal plan.        Cognition and Memory: Cognition normal.   Review of Systems  Constitutional: Negative.  Negative for fever.  HENT: Negative.  Negative for congestion, sinus pain and sore throat.   Respiratory: Negative.  Negative for cough and shortness of breath.   Cardiovascular: Negative.  Negative for chest pain.  Gastrointestinal: Negative.   Genitourinary: Negative.   Musculoskeletal: Negative.   Neurological: Negative.    Blood pressure 114/77, pulse (!) 105, temperature 98 F (36.7 C), temperature source Oral, resp. rate 17, height '5\' 3"'$  (1.6 m), weight 94.8 kg, SpO2 100 %. Body mass index is 37.02 kg/m.   Treatment Plan Summary: Daily contact with patient to assess and evaluate symptoms and progress in treatment and Medication management  MDD, recurrent, severe  -Continue Celexa 10 mg PO daily for depression   Anxiety: -Continue Vistaril 25 mg PO TID PRN   Insomnia: -Continue Trazodone 50 mg PO at bedtime PRN   Alcohol Use disorder: -Continue CIWA protocol Ativan 1 mg PO every 6 hours as needed for CIWA >12 Thiamine 100 mg PO daily Zofran 4 mg every 6 hours as needed for nausea Imodium 2-4 mg as needed for diarrhea Multivitamin PO daily   Medical management: Type 2 Diabetes Mellitus Continue Jardiance 25 mg PO daily Continue Metformin 1000 mg PO BID CBG AC/HS   Bacterial Vaginosis: Initiate Diflucan 150 mg PO one time Flagyl 500 mg PO BID for 7 days.    Hypokalemia: Potassium level 3.4 on 9/2 K-Dur 40 mEq once Recheck CMP on Monday 11/10/2020   Other PRN medications Tylenol 650 mg PO every 6 hours ads  needed for pain Mylanta 30 mL every 4 hours as needed for indigestion Milk of Magnesia 30 mL daily as needed for constipation   Continue every 15 minute safety checks Encourage participation in the therapeutic milieu Discharge planning in progress.   Ethelene Hal, NP 11/09/2020, 2:33 PM

## 2020-11-09 NOTE — [facility] Group Notes (Signed)
Adult Psychoeducational Group Note  Date:  11/09/2020 Time:  4:51 PM  Group Topic/Focus:  Self Care:   The focus of this group is to help patients understand the importance of self-care in order to improve or restore emotional, physical, spiritual, interpersonal, and financial health.  Participation Level:  Did Not Attend  Participation Quality:    Affect:    Cognitive:    Insight:   Engagement in Group:    Modes of Intervention:    Additional Comment  Dub Mikes 11/09/2020, 4:51 PM

## 2020-11-10 ENCOUNTER — Encounter (HOSPITAL_COMMUNITY): Payer: Self-pay

## 2020-11-10 ENCOUNTER — Encounter (HOSPITAL_COMMUNITY): Payer: Self-pay | Admitting: Behavioral Health

## 2020-11-10 DIAGNOSIS — F1210 Cannabis abuse, uncomplicated: Secondary | ICD-10-CM | POA: Diagnosis present

## 2020-11-10 DIAGNOSIS — F1220 Cannabis dependence, uncomplicated: Secondary | ICD-10-CM | POA: Diagnosis present

## 2020-11-10 DIAGNOSIS — F1010 Alcohol abuse, uncomplicated: Secondary | ICD-10-CM | POA: Diagnosis present

## 2020-11-10 DIAGNOSIS — F332 Major depressive disorder, recurrent severe without psychotic features: Secondary | ICD-10-CM | POA: Diagnosis not present

## 2020-11-10 HISTORY — DX: Alcohol abuse, uncomplicated: F10.10

## 2020-11-10 HISTORY — DX: Cannabis abuse, uncomplicated: F12.10

## 2020-11-10 LAB — RESP PANEL BY RT-PCR (FLU A&B, COVID) ARPGX2
Influenza A by PCR: NEGATIVE
Influenza B by PCR: NEGATIVE
SARS Coronavirus 2 by RT PCR: NEGATIVE

## 2020-11-10 LAB — COMPREHENSIVE METABOLIC PANEL
ALT: 18 U/L (ref 0–44)
AST: 23 U/L (ref 15–41)
Albumin: 3.9 g/dL (ref 3.5–5.0)
Alkaline Phosphatase: 60 U/L (ref 38–126)
Anion gap: 7 (ref 5–15)
BUN: 10 mg/dL (ref 6–20)
CO2: 26 mmol/L (ref 22–32)
Calcium: 9.7 mg/dL (ref 8.9–10.3)
Chloride: 106 mmol/L (ref 98–111)
Creatinine, Ser: 0.74 mg/dL (ref 0.44–1.00)
GFR, Estimated: >60 mL/min (ref >60)
Glucose, Bld: 104 mg/dL — ABNORMAL HIGH (ref 70–99)
Potassium: 4.2 mmol/L (ref 3.5–5.1)
Sodium: 139 mmol/L (ref 135–145)
Total Bilirubin: 0.5 mg/dL (ref 0.3–1.2)
Total Protein: 7.7 g/dL (ref 6.5–8.1)

## 2020-11-10 LAB — GLUCOSE, CAPILLARY: Glucose-Capillary: 94 mg/dL (ref 70–99)

## 2020-11-10 MED ORDER — METRONIDAZOLE 500 MG PO TABS: 500.0000 mg | ORAL_TABLET | Freq: Two times a day (BID) | ORAL | 0 refills | Status: DC

## 2020-11-10 MED ORDER — ADULT MULTIVITAMIN W/MINERALS CH: 1.0000 | ORAL_TABLET | Freq: Every day | ORAL | Status: DC

## 2020-11-10 MED ORDER — CITALOPRAM HYDROBROMIDE 10 MG PO TABS: 10.0000 mg | ORAL_TABLET | Freq: Every day | ORAL | 0 refills | Status: DC
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
## 2020-11-10 NOTE — Progress Notes (Signed)
Recreation Therapy Notes  Date:  9.5.22 Time: 0930 Location: [address]    Group Topic: Stress Management   Goal Area(s) Addresses:  Patient will identify positive stress management techniques. Patient will identify benefits of using stress management post d/c.   Behavioral Response:  Attentive   Intervention: Packet   Activity :  Patients were given a packet that covered ways to reduce stress.  It also gave examples of techniques to use to deal with stress such as progressive muscle relaxation, deep breathing and guided imagery.       Education:  Stress Management, Discharge Planning.    Education Outcome: Acknowledges Education   Clinical Observations/Feedback: Pt was attentive as the packet was discussed.         Victorino Sparrow, LRT/CTRS         Victorino Sparrow A 11/10/2020 11:31 AM

## 2020-11-10 NOTE — Discharge Instructions (Addendum)
Please be sure to have your PCP check you blood work for anemia and platelet count. You are chronically anemic and your platelet count is elevated, which could be a result of your alcohol consumption. You were educated of the risk factors associated with elevated platelet counts. You are strongly advised to abstain from alcohol and also not to drink while taking Flagyl due to the severe reaction you could have.   Please consider substance abuse intensive outpatient or rehab for alcohol abuse. You can speak to your outpatient provider at [place] regarding this matter.

## 2020-11-10 NOTE — Progress Notes (Signed)
  [facility] Adult Case Management Discharge Plan :  Will you be returning to the same living situation after discharge:  Yes,  to home At discharge, do you have transportation home?: No. Taxi to be arranged Do you have the ability to pay for your medications: Yes,  has insurance  Release of information consent forms completed and in the chart;  Patient's signature needed at discharge.  Patient to Follow up at:  Follow-up [place] to.   Specialty: Professional Counselor Why: Please follow up with clinic for outpatient services during walk-in hours; Monday-Friday 8:30a.-12:00p and 1:00p-2:30p. Be sure to bring the following; photo ID, insurance card, SSN, current medications and discharge paperwork from this hospitalization. Contact information: [facility] [place] [address] [phone]         [facility] Follow up.   Why: [address] [place],  [address]  Phone: [phone]                Next level of care provider has access to [place] and Suicide Prevention discussed: Yes,  with patient     Has patient been referred to the Quitline?: Patient refused referral  Patient has been referred for addiction treatment: Pt. refused referral  Vassie Moselle, LCSW 11/10/2020, 10:20 AM

## 2020-11-10 NOTE — Progress Notes (Addendum)
Discharge Note:   Pt discharged at 11:30pm, left via Taxi cab/and voucher via SW arrangement to go home. Upon discharge pt is A&OX4, calm, cooperative, pleasant, denies SI/HI/AVH. PT was given her D/C instructions which included pt's outpt follow up appts, electronic prescriptions were sent by provider to pt's outpt pharmacy, discharge medication education provided; pt verbalized understanding of all. Pt voiced no complaints; no distress noted, none reported. All personal belongings returned to pt upon discharge.

## 2020-11-10 NOTE — BHH Suicide Risk Assessment (Signed)
[facility] Discharge Suicide Risk Assessment   Principal Problem: Adjustment disorder with mixed anxiety and depressed mood Discharge Diagnoses: Principal Problem:   Adjustment disorder with mixed anxiety and depressed mood Active Problems:   Type 2 diabetes mellitus without complication, without long-term current use of insulin (HCC)   Alcohol abuse   Cannabis abuse  Total Time Spent in Direct Patient Care:  I personally spent 35 minutes on the unit in direct patient care. The direct patient care time included face-to-face time with the patient, reviewing the patient's chart, communicating with other professionals, and coordinating care. Greater than 50% of this time was spent in counseling or coordinating care with the patient regarding goals of hospitalization, psycho-education, and discharge planning needs.  Subjective: Patient seen on round with APP. She denies SI, HI, AVH, paranoia, or delusions. She voices no physical complaints and reports stable sleep and appetite. She denies signs of alcohol withdrawal or cravings and voices no physical complaints. She has tolerated the start of Celexa without noted side-effects. She was encouraged to consider SAIOP or residential rehab for alcohol use but declines these. She was encouraged to attend AA and go to outpatient substance abuse counseling and mental health appointments without fail. She was told that if she drinks alcohol while taking Flagyl she will have a severe, adverse reaction and that she needs to abstain from alcohol use after discharge. She was encouraged to see her primary care provider for BV recheck after discharge. She can articulate a safety and aftercare plan and requests discharge today.She was made aware of a potential COVID exposure on the unit and was advised to be tested immediately if she develops any COVID symptoms after discharge. She was advised that she needs to see a PCP after discharge for recheck of mildly elevated platelets  and mild anemia without fail.  Musculoskeletal: Strength & Muscle Tone: within normal limits Gait & Station: normal, steady Patient leans: N/A  Psychiatric Specialty Exam: Physical Exam Vitals reviewed.  HENT:     Head: Normocephalic.  Pulmonary:     Effort: Pulmonary effort is normal.  Neurological:     General: No focal deficit present.     Mental Status: She is alert.    Review of Systems  Respiratory:  Negative for shortness of breath.   Cardiovascular:  Negative for chest pain.  Gastrointestinal:  Negative for diarrhea, nausea and vomiting.   Blood pressure (!) 124/96, pulse 88, temperature 98.2 F (36.8 C), temperature source Oral, resp. rate 16, height '5\' 3"'$  (1.6 m), weight 94.8 kg, SpO2 100 %.Body mass index is 37.02 kg/m.  General Appearance:  casually dressed, adequate hygiene  Eye Contact:  Good  Speech:  Clear and Coherent and Normal Rate  Volume:  Normal  Mood:  Euthymic  Affect:   moderate, stable, full  Thought Process:  Goal Directed and Linear  Orientation:  Full (Time, Place, and Person)  Thought Content:  Logical and no evidence of psychosis, mania, or delusions  Suicidal Thoughts:  No  Homicidal Thoughts:  No  Memory:  Recent;   Good  Judgement:  Fair  Insight:  Fair  Psychomotor Activity:  Normal  Concentration:  Concentration: Good and Attention Span: Good  Recall:  Good  Fund of Knowledge:  Good  Language:  Good  Akathisia:  Negative  Assets:  Communication Skills Desire for Improvement Housing Resilience Social Support  ADL's:  Intact  Cognition:  WNL   Mental Status Per Nursing Assessment::   On Admission:  SI and  HI - resolved  Demographic Factors:  Low socioeconomic status  Loss Factors: Financial problems/change in socioeconomic status  Historical Factors: Impulsivity and h/o previous abuse, alcohol abuse prior to admission, previous psychiatric treatment/diagnoses  Risk Reduction Factors:   Employed, Positive social  support, and Positive coping skills or problem solving skills  Continued Clinical Symptoms:  More than one psychiatric diagnosis Previous Psychiatric Diagnoses and Treatments  Cognitive Features That Contribute To Risk:  Thought constriction (tunnel vision)    Suicide Risk:  Minimal: No identifiable suicidal ideation.  May be classified as minimal risk based on the severity of the depressive symptoms   Follow-up [place] to.   Specialty: Professional Counselor Why: Please follow up with clinic for outpatient services during walk-in hours; Monday-Friday 8:30a.-12:00p and 1:00p-2:30p. Be sure to bring the following; photo ID, insurance card, SSN, current medications and discharge paperwork from this hospitalization. Contact information: [facility] [place] [address] [phone]                 Plan Of Care/Follow-up recommendations:  Activity:  as tolerated Diet:  diabetic/heart healthy Other:  Patient advised to keep scheduled outpatient mental health follow up appointment and to comply with medications. She was encouraged to see her primary care provider for recheck of her BV symptoms and for management of her diabetes after discharge. She appears to have chronic,mild anemia and mildly elevated platelets that she was encouraged to see a primary care provider for after discharge.She was advised to abstain from alcohol and illicit substances and was specifically made aware of potential serious adverse reactions if she was to drink alcohol while taking Flagyl. She was encouraged to engage in [facility] and outpatient addictions counseling after discharge. She was made aware of a potential COVID exposure on the unit and was advised to be tested immediately in the community if she develops any COVID symptoms. Harlow Asa, MD, FAPA 11/10/2020, 8:17 AM

## 2020-11-10 NOTE — Progress Notes (Signed)
Adult Psychoeducational Group Note  Date:  11/10/2020 Time:  1:03 AM  Group Topic/Focus:  Wrap-Up Group:   The focus of this group is to help patients review their daily goal of treatment and discuss progress on daily workbooks.  Participation Level:  Active  Participation Quality:  Appropriate  Affect:  Appropriate  Cognitive:  Appropriate  Insight: Appropriate  Engagement in Group:  Developing/Improving  Modes of Intervention:  Discussion  Additional Comments:  Pt stated her goal for today was to focus on her treatment plan. Pt stated she accomplished her goal today. Pt stated she talked with her doctor but did not get a chance to talk with her social worker about her care today. Pt rated her overall day a 10. Pt stated she made no calls today. Pt stated she felt better about herself tonight. Pt stated she was able to attend all groups held today. Pt stated staff brought back all meals today because of Covid outbreak. Pt stated she took all medications provided today. Pt stated her appetite was pretty good today. Pt rated her sleep last night was pretty good. Pt stated the goal tonight was to get some rest. Pt stated she had no physical pain today. Pt deny visual hallucinations and auditory issues tonight. Pt denies thoughts of harming herself or others. Pt stated she would alert staff if anything changed.  Candy Sledge 11/10/2020, 1:03 AM

## 2020-11-10 NOTE — BHH Group Notes (Signed)
Topic:   Due to the acuity, complex discharge plans, and Covid-19 precautions, group was not held. Patient was provided therapeutic worksheets and asked to meet with CSW as needed.

## 2020-11-10 NOTE — BH IP Treatment Plan (Signed)
Interdisciplinary Treatment and Diagnostic Plan Update  11/10/2020 Time of Session: 10:30am Grace Montgomery MRN: [id number]  Principal Diagnosis: Adjustment disorder with mixed anxiety and depressed mood  Secondary Diagnoses: Principal Problem:   Adjustment disorder with mixed anxiety and depressed mood Active Problems:   Type 2 diabetes mellitus without complication, without long-term current use of insulin (HCC)   Alcohol abuse   Cannabis abuse   Current Medications:  Current Facility-Administered Medications  Medication Dose Route Frequency Provider Last Rate Last Admin   acetaminophen (TYLENOL) tablet 650 mg  650 mg Oral Q6H PRN White, Patrice L, NP       alum & mag hydroxide-simeth (MAALOX/MYLANTA) 200-200-20 MG/5ML suspension 30 mL  30 mL Oral Q4H PRN White, Patrice L, NP       citalopram (CELEXA) tablet 10 mg  10 mg Oral Daily Ethelene Hal, NP   10 mg at 11/10/20 [id number]   empagliflozin (JARDIANCE) tablet 25 mg  25 mg Oral Daily White, Patrice L, NP   25 mg at 11/10/20 [id number]   hydrOXYzine (ATARAX/VISTARIL) tablet 25 mg  25 mg Oral TID PRN France Ravens, MD       loperamide (IMODIUM) capsule 2-4 mg  2-4 mg Oral PRN Ethelene Hal, NP       LORazepam (ATIVAN) tablet 1 mg  1 mg Oral Q6H PRN Ethelene Hal, NP       magnesium hydroxide (MILK OF MAGNESIA) suspension 30 mL  30 mL Oral Daily PRN White, Patrice L, NP       metFORMIN (GLUCOPHAGE) tablet 1,000 mg  1,000 mg Oral BID WC White, Patrice L, NP   1,000 mg at 11/10/20 0819   metroNIDAZOLE (FLAGYL) tablet 500 mg  500 mg Oral Q12H Ethelene Hal, NP   500 mg at 11/10/20 [id number]   multivitamin with minerals tablet 1 tablet  1 tablet Oral Daily Ethelene Hal, NP   1 tablet at 11/10/20 0819   ondansetron (ZOFRAN-ODT) disintegrating tablet 4 mg  4 mg Oral Q6H PRN Ethelene Hal, NP       thiamine tablet 100 mg  100 mg Oral Daily Ethelene Hal, NP   100 mg at 11/10/20 [id number]   traZODone  (DESYREL) tablet 50 mg  50 mg Oral QHS PRN Bobbitt, Shalon E, NP   50 mg at 11/08/20 2114   PTA Medications: Medications Prior to Admission  Medication Sig Dispense Refill Last Dose   empagliflozin (JARDIANCE) 25 MG TABS tablet Take 1 tablet (25 mg total) by mouth daily. 90 tablet 3    ibuprofen (ADVIL) 200 MG tablet Take 400 mg by mouth every 6 (six) hours as needed for headache or moderate pain.      metFORMIN (GLUCOPHAGE) 1000 MG tablet Take 1 tablet (1,000 mg total) by mouth 2 (two) times daily with a meal. 180 tablet 3    metroNIDAZOLE (FLAGYL) 500 MG tablet Take 1 tablet (500 mg total) by mouth 2 (two) times daily. (Patient not taking: Reported on 11/07/2020) 14 tablet 0    Multiple Vitamins-Minerals (WOMENS MULTIVITAMIN PO) Take 1 tablet by mouth daily.       Patient Stressors: Marital or family conflict    Patient Strengths: General fund of knowledge   Treatment Modalities: Medication Management, Group therapy, Case management,  1 to 1 session with clinician, Psychoeducation, Recreational therapy.   Physician Treatment Plan for Primary Diagnosis: Adjustment disorder with mixed anxiety and depressed mood Long Term Goal(s): Improvement in symptoms so as ready  for discharge   Short Term Goals: Ability to identify changes in lifestyle to reduce recurrence of condition will improve Ability to verbalize feelings will improve Ability to disclose and discuss suicidal ideas Ability to demonstrate self-control will improve Ability to identify and develop effective coping behaviors will improve Compliance with prescribed medications will improve Ability to identify triggers associated with substance abuse/mental health issues will improve  Medication Management: Evaluate patient's response, side effects, and tolerance of medication regimen.  Therapeutic Interventions: 1 to 1 sessions, Unit Group sessions and Medication administration.  Evaluation of Outcomes: Adequate for  Discharge  Physician Treatment Plan for Secondary Diagnosis: Principal Problem:   Adjustment disorder with mixed anxiety and depressed mood Active Problems:   Type 2 diabetes mellitus without complication, without long-term current use of insulin (HCC)   Alcohol abuse   Cannabis abuse  Long Term Goal(s): Improvement in symptoms so as ready for discharge   Short Term Goals: Ability to identify changes in lifestyle to reduce recurrence of condition will improve Ability to verbalize feelings will improve Ability to disclose and discuss suicidal ideas Ability to demonstrate self-control will improve Ability to identify and develop effective coping behaviors will improve Compliance with prescribed medications will improve Ability to identify triggers associated with substance abuse/mental health issues will improve     Medication Management: Evaluate patient's response, side effects, and tolerance of medication regimen.  Therapeutic Interventions: 1 to 1 sessions, Unit Group sessions and Medication administration.  Evaluation of Outcomes: Adequate for Discharge   RN Treatment Plan for Primary Diagnosis: Adjustment disorder with mixed anxiety and depressed mood Long Term Goal(s): Knowledge of disease and therapeutic regimen to maintain health will improve  Short Term Goals: Ability to remain free from injury will improve, Ability to verbalize frustration and anger appropriately will improve, Ability to identify and develop effective coping behaviors will improve, and Compliance with prescribed medications will improve  Medication Management: RN will administer medications as ordered by provider, will assess and evaluate patient's response and provide education to patient for prescribed medication. RN will report any adverse and/or side effects to prescribing provider.  Therapeutic Interventions: 1 on 1 counseling sessions, Psychoeducation, Medication administration, Evaluate responses to  treatment, Monitor vital signs and CBGs as ordered, Perform/monitor CIWA, COWS, AIMS and Fall Risk screenings as ordered, Perform wound care treatments as ordered.  Evaluation of Outcomes: Adequate for Discharge   LCSW Treatment Plan for Primary Diagnosis: Adjustment disorder with mixed anxiety and depressed mood Long Term Goal(s): Safe transition to appropriate next level of care at discharge, Engage patient in therapeutic group addressing interpersonal concerns.  Short Term Goals: Engage patient in aftercare planning with referrals and resources, Increase social support, Identify triggers associated with mental health/substance abuse issues, and Increase skills for wellness and recovery  Therapeutic Interventions: Assess for all discharge needs, 1 to 1 time with Social worker, Explore available resources and support systems, Assess for adequacy in community support network, Educate family and significant other(s) on suicide prevention, Complete Psychosocial Assessment, Interpersonal group therapy.  Evaluation of Outcomes: Adequate for Discharge   Progress in Treatment: Attending groups: Yes. Participating in groups: Yes. Taking medication as prescribed: Yes. Toleration medication: Yes. Family/Significant other contact made: No, will contact:  declined consents Patient understands diagnosis: Yes. Discussing patient identified problems/goals with staff: Yes. Medical problems stabilized or resolved: Yes. Denies suicidal/homicidal ideation: Yes. Issues/concerns per patient self-inventory: No.   New problem(s) identified: No, Describe:  none  New Short Term/Long Term Goal(s): medication stabilization, elimination of  SI thoughts, development of comprehensive mental wellness plan.    Patient Goals:  Did not attend  Discharge Plan or Barriers: Pt is to return home and is to follow up with [facility] for medication management and therapy  Reason for Continuation of Hospitalization: Medication  stabilization  Estimated Length of Stay: Adequate for Discharge   Scribe for Treatment Team: Vassie Moselle, [place] 11/10/2020 11:18 AM

## 2020-11-11 ENCOUNTER — Encounter: Payer: Self-pay | Admitting: Family Medicine

## 2020-12-05 ENCOUNTER — Other Ambulatory Visit: Payer: Self-pay

## 2020-12-05 ENCOUNTER — Encounter (HOSPITAL_COMMUNITY): Payer: Self-pay

## 2020-12-05 ENCOUNTER — Ambulatory Visit (HOSPITAL_COMMUNITY)
Admission: EM | Admit: 2020-12-05 | Discharge: 2020-12-06 | Disposition: A | Discharge status: Home / Self Care | Payer: Medicaid Other | Attending: Behavioral Health | Admitting: Behavioral Health

## 2020-12-05 DIAGNOSIS — F17210 Nicotine dependence, cigarettes, uncomplicated: Secondary | ICD-10-CM | POA: Diagnosis not present

## 2020-12-05 DIAGNOSIS — Z7289 Other problems related to lifestyle: Secondary | ICD-10-CM | POA: Insufficient documentation

## 2020-12-05 DIAGNOSIS — Z20822 Contact with and (suspected) exposure to covid-19: Secondary | ICD-10-CM | POA: Diagnosis not present

## 2020-12-05 DIAGNOSIS — R45850 Homicidal ideations: Secondary | ICD-10-CM | POA: Insufficient documentation

## 2020-12-05 DIAGNOSIS — Z79899 Other long term (current) drug therapy: Secondary | ICD-10-CM | POA: Insufficient documentation

## 2020-12-05 DIAGNOSIS — Z818 Family history of other mental and behavioral disorders: Secondary | ICD-10-CM | POA: Insufficient documentation

## 2020-12-05 LAB — POCT URINE DRUG SCREEN - MANUAL ENTRY (I-SCREEN)
POC Amphetamine UR: NOT DETECTED
POC Buprenorphine (BUP): NOT DETECTED
POC Cocaine UR: NOT DETECTED
POC Marijuana UR: POSITIVE — AB
POC Methadone UR: NOT DETECTED
POC Methamphetamine UR: NOT DETECTED
POC Morphine: NOT DETECTED
POC Oxazepam (BZO): NOT DETECTED
POC Oxycodone UR: NOT DETECTED
POC Secobarbital (BAR): NOT DETECTED

## 2020-12-05 LAB — GLUCOSE, CAPILLARY: Glucose-Capillary: 128 mg/dL — ABNORMAL HIGH (ref 70–99)

## 2020-12-05 LAB — POC SARS CORONAVIRUS 2 AG -  ED: SARS Coronavirus 2 Ag: NEGATIVE

## 2020-12-05 LAB — POCT PREGNANCY, URINE: Preg Test, Ur: NEGATIVE

## 2020-12-05 MED ORDER — CITALOPRAM HYDROBROMIDE 10 MG PO TABS
10.0000 mg | ORAL_TABLET | Freq: Every day | ORAL | Status: DC
  Administered 2020-12-06: 10 mg via ORAL
  Filled 2020-12-05 (×2): qty 1

## 2020-12-05 MED ORDER — MAGNESIUM HYDROXIDE 400 MG/5ML PO SUSP: 30.0000 mL | Freq: Every day | ORAL | Status: DC | PRN

## 2020-12-05 MED ORDER — HYDROXYZINE HCL 25 MG PO TABS: 25.0000 mg | ORAL_TABLET | Freq: Three times a day (TID) | ORAL | Status: DC | PRN

## 2020-12-05 MED ORDER — TRAZODONE HCL 50 MG PO TABS: 50.0000 mg | ORAL_TABLET | Freq: Every evening | ORAL | Status: DC | PRN

## 2020-12-05 MED ORDER — ALUM & MAG HYDROXIDE-SIMETH 200-200-20 MG/5ML PO SUSP: 30.0000 mL | ORAL | Status: DC | PRN

## 2020-12-05 MED ORDER — METFORMIN HCL 500 MG PO TABS
1000.0000 mg | ORAL_TABLET | Freq: Two times a day (BID) | ORAL | Status: DC
  Administered 2020-12-06: 1000 mg via ORAL
  Filled 2020-12-05: qty 2

## 2020-12-05 MED ORDER — ACETAMINOPHEN 325 MG PO TABS: 650.0000 mg | ORAL_TABLET | Freq: Four times a day (QID) | ORAL | Status: DC | PRN

## 2020-12-05 NOTE — ED Notes (Signed)
30 yo female bought in by [employer] endorsing HI toward cousin living in her house. Pt states, "I decided to come here before I Knock her the fuck out, meddling in my business". Pt denies intent or plan to RN. Denies SI/AVH. Cooperative throughout assessment. Labs drawn without difficulty to right arm. Will monitor for safety.
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
## 2020-12-05 NOTE — ED Notes (Signed)
Pt sleeping no signs or symptoms that would indicate distress respirations easy.

## 2020-12-05 NOTE — ED Provider Notes (Signed)
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 at least 8 hours.   Comment 1 11/07/2020 Notify RN   Final   Comment 2 11/07/2020 Document in Chart   Final   Glucose-Capillary 11/07/2020 126 (A) 70 - 99 mg/dL Final   Glucose  reference range  applies only to samples taken after fasting for at least 8 hours.  Admission on 08/07/2020, Discharged on 08/07/2020  Component Date Value Ref Range Status   Neisseria Gonorrhea 08/07/2020 Negative   Final   Chlamydia 08/07/2020 Negative   Final   Trichomonas 08/07/2020 Negative   Final   Bacterial Vaginitis (gardnerella) 08/07/2020 Positive (A)  Final   Candida Vaginitis 08/07/2020 Negative   Final   Candida Glabrata 08/07/2020 Negative   Final   Comment 08/07/2020 Normal Reference Range Bacterial Vaginosis - Negative   Final   Comment 08/07/2020 Normal Reference Range Candida Species - Negative   Final   Comment 08/07/2020 Normal Reference Range Candida Galbrata - Negative   Final   Comment 08/07/2020 Normal Reference Range Trichomonas - Negative   Final   Comment 08/07/2020 Normal Reference Ranger Chlamydia - Negative   Final   Comment 08/07/2020 Normal Reference Range Neisseria Gonorrhea - Negative   Final    Allergies: Monistat [tioconazole], Chocolate, Ibuprofen, and Banana  PTA Medications: (Not in a hospital admission)   Medical Decision Making  Patient admitted to continuous observation for safety and mood stabilization.   Lab Orders         Resp Panel by RT-PCR (Flu A&B, Covid) Nasopharyngeal Swab         CBC with Differential/Platelet         Comprehensive metabolic panel         Ethanol         Pregnancy, urine         Glucose, capillary         POCT Urine Drug Screen - (ICup)         POC SARS Coronavirus 2 Ag-ED - Nasal Swab         CBG monitoring         Pregnancy, urine POC     Medications ordered:  [START ON 12/06/2020] citalopram  10 mg Oral Daily   [START ON 12/06/2020] metFORMIN  1,000 mg Oral BID WC     Recommendations  Based on my evaluation the patient does not appear to have an emergency medical condition.  Marissa Calamity, NP 12/05/20  7:06 PM

## 2020-12-06 ENCOUNTER — Encounter (HOSPITAL_COMMUNITY): Payer: Self-pay

## 2020-12-06 LAB — COMPREHENSIVE METABOLIC PANEL
ALT: 17 U/L (ref 0–44)
AST: 20 U/L (ref 15–41)
Albumin: 4.1 g/dL (ref 3.5–5.0)
Alkaline Phosphatase: 64 U/L (ref 38–126)
Anion gap: 12 (ref 5–15)
BUN: <5 mg/dL — ABNORMAL LOW (ref 6–20)
CO2: 23 mmol/L (ref 22–32)
Calcium: 9.5 mg/dL (ref 8.9–10.3)
Chloride: 109 mmol/L (ref 98–111)
Creatinine, Ser: 0.81 mg/dL (ref 0.44–1.00)
GFR, Estimated: >60 mL/min (ref >60)
Glucose, Bld: 92 mg/dL (ref 70–99)
Potassium: 4.5 mmol/L (ref 3.5–5.1)
Sodium: 144 mmol/L (ref 135–145)
Total Bilirubin: 0.3 mg/dL (ref 0.3–1.2)
Total Protein: 7.7 g/dL (ref 6.5–8.1)

## 2020-12-06 LAB — CBC WITH DIFFERENTIAL/PLATELET
Abs Immature Granulocytes: 0.03 10*3/uL (ref 0.00–0.07)
Basophils Absolute: 0.1 10*3/uL (ref 0.0–0.1)
Basophils Relative: 1 %
Eosinophils Absolute: 0.1 10*3/uL (ref 0.0–0.5)
Eosinophils Relative: 2 %
HCT: 37.6 % (ref 36.0–46.0)
Hemoglobin: 11.8 g/dL — ABNORMAL LOW (ref 12.0–15.0)
Immature Granulocytes: 0 %
Lymphocytes Relative: 45 %
Lymphs Abs: 3.4 10*3/uL (ref 0.7–4.0)
MCH: 27.1 pg (ref 26.0–34.0)
MCHC: 31.4 g/dL (ref 30.0–36.0)
MCV: 86.4 fL (ref 80.0–100.0)
Monocytes Absolute: 0.3 10*3/uL (ref 0.1–1.0)
Monocytes Relative: 4 %
Neutro Abs: 3.6 10*3/uL (ref 1.7–7.7)
Neutrophils Relative %: 48 %
Platelets: 557 10*3/uL — ABNORMAL HIGH (ref 150–400)
RBC: 4.35 MIL/uL (ref 3.87–5.11)
RDW: 16.8 % — ABNORMAL HIGH (ref 11.5–15.5)
WBC: 7.4 10*3/uL (ref 4.0–10.5)
nRBC: 0 % (ref 0.0–0.2)

## 2020-12-06 LAB — ETHANOL: Alcohol, Ethyl (B): 318 mg/dL (ref <10)

## 2020-12-06 LAB — RESP PANEL BY RT-PCR (FLU A&B, COVID) ARPGX2
Influenza A by PCR: NEGATIVE
Influenza B by PCR: NEGATIVE
SARS Coronavirus 2 by RT PCR: NEGATIVE

## 2020-12-06 LAB — GLUCOSE, CAPILLARY: Glucose-Capillary: 96 mg/dL (ref 70–99)

## 2020-12-06 MED ORDER — ONDANSETRON 4 MG PO TBDP: 4.0000 mg | ORAL_TABLET | Freq: Four times a day (QID) | ORAL | Status: DC | PRN

## 2020-12-06 MED ORDER — ADULT MULTIVITAMIN W/MINERALS CH
1.0000 | ORAL_TABLET | Freq: Every day | ORAL | Status: DC
  Administered 2020-12-06: 1 via ORAL
  Filled 2020-12-06: qty 1

## 2020-12-06 MED ORDER — THIAMINE HCL 100 MG/ML IJ SOLN
100.0000 mg | Freq: Once | INTRAMUSCULAR | Status: AC
Start: 2020-12-06 — End: 2020-12-06
  Administered 2020-12-06: 100 mg via INTRAMUSCULAR
  Filled 2020-12-06: qty 2

## 2020-12-06 MED ORDER — LOPERAMIDE HCL 2 MG PO CAPS: 2.0000 mg | ORAL_CAPSULE | ORAL | Status: DC | PRN

## 2020-12-06 MED ORDER — HYDROXYZINE HCL 25 MG PO TABS: 25.0000 mg | ORAL_TABLET | Freq: Four times a day (QID) | ORAL | Status: DC | PRN

## 2020-12-06 MED ORDER — LORAZEPAM 1 MG PO TABS: 1.0000 mg | ORAL_TABLET | Freq: Four times a day (QID) | ORAL | Status: DC | PRN

## 2020-12-06 MED ORDER — THIAMINE HCL 100 MG PO TABS
100.0000 mg | ORAL_TABLET | Freq: Every day | ORAL | Status: DC
Start: 2020-12-07 — End: 2020-12-06

## 2020-12-06 NOTE — ED Provider Notes (Signed)
Patient with a history of alcohol abuse. ETOH level on arrival was 318. Order place for CIWA for alcohol withdraw. Patient denies history of alcohol withdraw seizures or DTs. Vital are stable.

## 2020-12-06 NOTE — Discharge Instructions (Signed)

## 2020-12-06 NOTE — ED Notes (Signed)
Patient sleeping.  Respirations even and unlabored.  Continue to monitor for safety.

## 2020-12-06 NOTE — Progress Notes (Signed)
BG taken this AM.  Patient declined breakfast.  Pharmacy in to talk with patient.

## 2020-12-06 NOTE — ED Notes (Addendum)
Provider notified of level of ETOH on arrival, orders received

## 2020-12-06 NOTE — Progress Notes (Signed)
Patient given hygiene supplies to take a shower.

## 2020-12-06 NOTE — ED Notes (Signed)
Patient resting well with no sxs of distress - will continue to monitor for safety

## 2020-12-06 NOTE — ED Notes (Signed)
Patient awake and asking for food - sandwich given - will continue to monitor for safety

## 2020-12-06 NOTE — ED Notes (Signed)
Discharge instructions provided and Pt stated understanding. Pt alert, orient and ambulatory prior to d/c from facility. Personal belongings returned from locker number 15. Pt escorted to the front lobby. Pt stated that she was going to call a cab to go home. Safety maintained.

## 2020-12-06 NOTE — Progress Notes (Signed)
Patient given ham and cheese sandwich for breakfast.  Stated she can't eat all that sugary stuff when offered cereal and cereal bars.
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
## 2020-12-15 ENCOUNTER — Telehealth (HOSPITAL_COMMUNITY): Payer: Self-pay | Admitting: Family Medicine

## 2020-12-15 NOTE — BH Assessment (Signed)
Care Management - Follow Up [facility] Discharges   Writer attempted to make contact with patient today and was unsuccessful.  Writer left a HIPPA compliant voice message.   

## 2021-03-26 ENCOUNTER — Encounter (HOSPITAL_COMMUNITY): Payer: Self-pay

## 2021-03-26 ENCOUNTER — Ambulatory Visit (HOSPITAL_COMMUNITY)
Admission: EM | Admit: 2021-03-26 | Discharge: 2021-03-26 | Disposition: A | Discharge status: Home / Self Care | Payer: Medicaid Other | Attending: Family Medicine | Admitting: Family Medicine

## 2021-03-26 ENCOUNTER — Other Ambulatory Visit: Payer: Self-pay

## 2021-03-26 DIAGNOSIS — N898 Other specified noninflammatory disorders of vagina: Secondary | ICD-10-CM | POA: Diagnosis not present

## 2021-03-26 MED ORDER — FLUCONAZOLE 150 MG PO TABS: ORAL_TABLET | ORAL | 0 refills | Status: DC

## 2021-03-26 MED ORDER — METRONIDAZOLE 500 MG PO TABS: 500.0000 mg | ORAL_TABLET | Freq: Two times a day (BID) | ORAL | 0 refills | Status: DC

## 2021-03-26 NOTE — ED Triage Notes (Signed)
Pt c/o vaginal pain and discharge x 3-4 days.

## 2021-03-26 NOTE — Discharge Instructions (Signed)
We have sent testing for sexually transmitted infections. We will notify you of any positive results once they are received. If required, we will prescribe any medications you might need. °

## 2021-03-26 NOTE — ED Provider Notes (Signed)
[place]   [id number] 03/26/21 Arrival Time: 6659  ASSESSMENT & PLAN:  1. Vaginal discharge    Will tx empirically for BV and yeast.  Begin: Meds ordered this encounter  Medications   fluconazole (DIFLUCAN) 150 MG tablet    Sig: Take one tablet by mouth as a single dose. May repeat in 3 days if symptoms persist.    Dispense:  2 tablet    Refill:  0   metroNIDAZOLE (FLAGYL) 500 MG tablet    Sig: Take 1 tablet (500 mg total) by mouth 2 (two) times daily.    Dispense:  14 tablet    Refill:  0   Pending: Labs Reviewed  CERVICOVAGINAL ANCILLARY ONLY      Discharge Instructions      We have sent testing for sexually transmitted infections. We will notify you of any positive results once they are received. If required, we will prescribe any medications you might need.    Without s/s of PID.  Will notify of any positive results. Instructed to refrain from sexual activity for at least seven days.  Reviewed expectations re: course of current medical issues. Questions answered. Outlined signs and symptoms indicating need for more acute intervention. Patient verbalized understanding. After Visit Summary given.   SUBJECTIVE:  Grace Montgomery is a 31 y.o. female who presents with complaint of vaginal discharge/irritation. H/O BV "and feels similar". Onset gradual. First noticed  3-4 d ago . Some odor. No specific aggravating or alleviating factors reported. Denies: urinary frequency, dysuria, and gross hematuria. Afebrile. No abdominal or pelvic pain. Normal PO intake wihout n/v. No genital rashes or lesions. OTC treatment: none.  OBJECTIVE:  Vitals:   03/26/21 1402  BP: 125/86  Pulse: 84  Resp: 16  Temp: 98.1 F (36.7 C)  TempSrc: Oral  SpO2: 99%     General appearance: alert, cooperative, appears stated age and no distress Lungs: unlabored respirations; speaks full sentences without difficulty Back: no CVA tenderness; FROM at waist Abdomen:  benign GU: deferred Skin: warm and dry Psychological: alert and cooperative; normal mood and affect.    Labs Reviewed  CERVICOVAGINAL ANCILLARY ONLY    Allergies  Allergen Reactions   Monistat [Tioconazole] Swelling   Chocolate Itching and Other (See Comments)    Ears & throat itch   Ibuprofen Swelling    Swelling of the feet   Banana Itching and Other (See Comments)    Throat itching    Past Medical History:  Diagnosis Date   Acute post-traumatic headache 06/09/2019   See 06/09/19 ED visit note   Alcohol abuse 11/10/2020   Anxiety    Asthma, mild persistent 06/29/2011   Bipolar affective disorder ([place]) 09/22/2018   Cannabis dependence    Carpal tunnel syndrome of left wrist 02/16/2019   Concussion 06/09/2019   See ED visit note 06/09/19   Diabetes mellitus without complication ([place])    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   Marijuana use 10/26/2018   Maternal morbid obesity, 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[place]) 06/29/2011   MDD (major depressive disorder), recurrent severe, 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   Rh negative state in antepartum period 03/02/2018   Will need rho gam   S/P emergency cesarean section 06/18/2018   Seasonal allergies 06/29/2011   On zyrtec OTC     Suicidal behavior    2013, 2018   Supervision of high risk pregnancy, antepartum 01/12/2018    Nursing Staff Provider Office Location  [facility] Dating  [place]  and 9.1 week [place] 12/08/18 Language  english Anatomy US   02/13/18 Flu Vaccine  Declined 01/12/18 Genetic Screen  NIPS: low risk  AFP: neg   TDaP vaccine   04/19/2018 Hgb A1C or  GTT Early  Third trimester  Rhogam  04/19/2018   LAB RESULTS  Feeding Plan both Blood Type --/--/A NEG (03/13 0947) A NEG  Contraception  condoms Antibody POS (03   Tobacco use 09/18/2018   Type 2 diabetes mellitus complicating pregnancy in third trimester, antepartum 01/05/2018   Current Diabetic Medications:  Insulin  [x]  Aspirin 81 mg daily after 12 weeks (? A2/B GDM)  For A2/B GDM  or higher classes of DM [x]  Diabetes Education and Testing Supplies [x]  Nutrition Counsult [ ]  Fetal ECHO after 20 weeks - ordered 12/26 [ ]  Eye exam for retina evaluation   Baseline and surveillance labs (pulled in from [facility], refresh links as needed)  Lab Results Component Value Date  CREATIN   Family History  Problem Relation Age of Onset   Sickle cell anemia Mother    Depression Mother    Diabetes Mother    Asthma Mother    Diabetes Other        grandparents and aunts/uncles   Stroke Other        grandparent   Alcohol abuse Father    Depression Father    Alcohol abuse Brother    Drug abuse Brother    Social History   Socioeconomic History   Marital status: Single    Spouse name: Not on file   Number of children: 4   Years of education: 11   Highest education level: 11th grade  Occupational History   Occupation: [employer]  Tobacco Use   Smoking status: Some Days    Packs/day: 0.25    Types: Cigarettes   Smokeless tobacco: Never  Vaping Use   Vaping Use: Never used  Substance and Sexual Activity   Alcohol use: Yes    Comment: "every other weekend"   Drug use: Not Currently    Comment: History of Marijuana - a few ago from  February 16, 2019   Sexual activity: Not Currently    Partners: Male    Birth control/protection: None  Other Topics Concern   Not on file  Social History Narrative   09/2018 Living independently in subsidized apartment    The father of her most recent child lives nearby to the patient.  He helps some with child care.      Patient has 4 Children    Friend assists with transportation   Desirae Mancusi would make decisions for pt if she were unable   Ms Laviolette reports feeling safe in her relationships   Social Determinants of Health   Financial Resource Strain: Not on file  Food Insecurity: Not on file  Transportation Needs: Not on file  Physical Activity: Not on file  Stress: Not on file  Social Connections: Not on file  Intimate Partner  Violence: Not on file           Vanessa Kick, MD 03/26/21 1456

## 2021-03-30 LAB — CERVICOVAGINAL ANCILLARY ONLY
Bacterial Vaginitis (gardnerella): POSITIVE — AB
Candida Glabrata: NEGATIVE
Candida Vaginitis: NEGATIVE
Chlamydia: NEGATIVE
Comment: NEGATIVE
Comment: NEGATIVE
Comment: NEGATIVE
Comment: NEGATIVE
Comment: NEGATIVE
Comment: NORMAL
Neisseria Gonorrhea: NEGATIVE
Trichomonas: NEGATIVE

## 2021-04-24 ENCOUNTER — Other Ambulatory Visit: Payer: Self-pay | Admitting: Family Medicine

## 2021-04-24 DIAGNOSIS — E1165 Type 2 diabetes mellitus with hyperglycemia: Secondary | ICD-10-CM

## 2021-04-24 DIAGNOSIS — E1169 Type 2 diabetes mellitus with other specified complication: Secondary | ICD-10-CM

## 2021-05-03 ENCOUNTER — Emergency Department (HOSPITAL_COMMUNITY): Payer: Medicaid Other

## 2021-05-03 ENCOUNTER — Other Ambulatory Visit: Payer: Self-pay

## 2021-05-03 ENCOUNTER — Emergency Department (HOSPITAL_COMMUNITY)
Admission: EM | Admit: 2021-05-03 | Discharge: 2021-05-04 | Disposition: A | Discharge status: Home / Self Care | Payer: Medicaid Other | Attending: Emergency Medicine | Admitting: Emergency Medicine

## 2021-05-03 ENCOUNTER — Encounter (HOSPITAL_COMMUNITY): Payer: Self-pay | Admitting: Emergency Medicine

## 2021-05-03 DIAGNOSIS — Z7984 Long term (current) use of oral hypoglycemic drugs: Secondary | ICD-10-CM | POA: Insufficient documentation

## 2021-05-03 DIAGNOSIS — R739 Hyperglycemia, unspecified: Secondary | ICD-10-CM

## 2021-05-03 DIAGNOSIS — Z20822 Contact with and (suspected) exposure to covid-19: Secondary | ICD-10-CM | POA: Insufficient documentation

## 2021-05-03 DIAGNOSIS — N110 Nonobstructive reflux-associated chronic pyelonephritis: Secondary | ICD-10-CM | POA: Insufficient documentation

## 2021-05-03 DIAGNOSIS — J45909 Unspecified asthma, uncomplicated: Secondary | ICD-10-CM | POA: Insufficient documentation

## 2021-05-03 DIAGNOSIS — D649 Anemia, unspecified: Secondary | ICD-10-CM | POA: Diagnosis not present

## 2021-05-03 DIAGNOSIS — E1165 Type 2 diabetes mellitus with hyperglycemia: Secondary | ICD-10-CM | POA: Diagnosis not present

## 2021-05-03 DIAGNOSIS — R509 Fever, unspecified: Secondary | ICD-10-CM | POA: Diagnosis present

## 2021-05-03 DIAGNOSIS — R000 Tachycardia, unspecified: Secondary | ICD-10-CM | POA: Diagnosis not present

## 2021-05-03 DIAGNOSIS — N12 Tubulo-interstitial nephritis, not specified as acute or chronic: Secondary | ICD-10-CM

## 2021-05-03 LAB — URINALYSIS, ROUTINE W REFLEX MICROSCOPIC
Bilirubin Urine: NEGATIVE
Glucose, UA: 150 mg/dL — AB
Hgb urine dipstick: NEGATIVE
Ketones, ur: NEGATIVE mg/dL
Nitrite: POSITIVE — AB
Protein, ur: 30 mg/dL — AB
Specific Gravity, Urine: 1.017 (ref 1.005–1.030)
WBC, UA: >50 WBC/hpf — ABNORMAL HIGH (ref 0–5)
pH: 6 (ref 5.0–8.0)

## 2021-05-03 LAB — CBC
HCT: 32.2 % — ABNORMAL LOW (ref 36.0–46.0)
Hemoglobin: 10.2 g/dL — ABNORMAL LOW (ref 12.0–15.0)
MCH: 26.3 pg (ref 26.0–34.0)
MCHC: 31.7 g/dL (ref 30.0–36.0)
MCV: 83 fL (ref 80.0–100.0)
Platelets: 391 10*3/uL (ref 150–400)
RBC: 3.88 MIL/uL (ref 3.87–5.11)
RDW: 17.2 % — ABNORMAL HIGH (ref 11.5–15.5)
WBC: 12.6 10*3/uL — ABNORMAL HIGH (ref 4.0–10.5)
nRBC: 0 % (ref 0.0–0.2)

## 2021-05-03 LAB — BASIC METABOLIC PANEL
Anion gap: 9 (ref 5–15)
BUN: 7 mg/dL (ref 6–20)
CO2: 24 mmol/L (ref 22–32)
Calcium: 9.3 mg/dL (ref 8.9–10.3)
Chloride: 100 mmol/L (ref 98–111)
Creatinine, Ser: 0.86 mg/dL (ref 0.44–1.00)
GFR, Estimated: >60 mL/min (ref >60)
Glucose, Bld: 168 mg/dL — ABNORMAL HIGH (ref 70–99)
Potassium: 3.4 mmol/L — ABNORMAL LOW (ref 3.5–5.1)
Sodium: 133 mmol/L — ABNORMAL LOW (ref 135–145)

## 2021-05-03 LAB — I-STAT BETA HCG BLOOD, ED (MC, WL, AP ONLY): I-stat hCG, quantitative: <5 m[IU]/mL (ref <5)

## 2021-05-03 MED ORDER — SODIUM CHLORIDE 0.9 % IV SOLN
1.0000 g | Freq: Once | INTRAVENOUS | Status: AC
  Administered 2021-05-04: 1 g via INTRAVENOUS
  Filled 2021-05-03: qty 10

## 2021-05-03 MED ORDER — SODIUM CHLORIDE 0.9 % IV BOLUS
1000.0000 mL | Freq: Once | INTRAVENOUS | Status: AC
  Administered 2021-05-04: 1000 mL via INTRAVENOUS

## 2021-05-03 MED ORDER — ONDANSETRON HCL 4 MG/2ML IJ SOLN
4.0000 mg | Freq: Once | INTRAMUSCULAR | Status: AC
  Administered 2021-05-04: 4 mg via INTRAVENOUS
  Filled 2021-05-03: qty 2

## 2021-05-03 MED ORDER — ACETAMINOPHEN 325 MG PO TABS
650.0000 mg | ORAL_TABLET | Freq: Once | ORAL | Status: AC
  Administered 2021-05-04: 650 mg via ORAL
  Filled 2021-05-03: qty 2

## 2021-05-03 NOTE — ED Triage Notes (Addendum)
Pt brought to ED by [facility] via wheelchair with c/o fever, chills,excessive urination with dark urine x 2 days. Pt is constantly distracted by cell phone, continually answer facetime videos and does not volunteer information at time of triage.

## 2021-05-03 NOTE — ED Provider Notes (Signed)
[facility] EMERGENCY DEPARTMENT Provider Note   CSN: [id number] Arrival date & time: 05/03/21  1936     History  Chief Complaint  Patient presents with   Fever    Grace Montgomery is a 31 y.o. female with a hx of bipolar affective disorder, alcohol abuse, T2DM, and asthma who presents to the ED with complaints of urinary sxs x 3-4 days. Patient reports that she has been experiencing urinary frequency and urgency with subsequent nausea, fever, chills, and left flank pain. Left flank pain radiates into the abdomen, constant, no alleviating/aggravating factors. Denies vomiting, dysuria, vaginal bleeding, vaginal discharge, or concern for STI.     HPI     Home Medications Prior to Admission medications   Medication Sig Start Date End Date Taking? Authorizing Provider  citalopram (CELEXA) 10 MG tablet Take 1 tablet (10 mg total) by mouth daily. 11/11/20   Ethelene Hal, NP  empagliflozin (JARDIANCE) 25 MG TABS tablet Take 1 tablet (25 mg total) by mouth daily. 04/18/20   McDiarmid, Blane Ohara, MD  fluconazole (DIFLUCAN) 150 MG tablet Take one tablet by mouth as a single dose. May repeat in 3 days if symptoms persist. 03/26/21   Vanessa Kick, MD  metFORMIN (GLUCOPHAGE) 1000 MG tablet Take 1 tablet (1,000 mg total) by mouth 2 (two) times daily with a meal. 04/18/20   McDiarmid, Blane Ohara, MD  metroNIDAZOLE (FLAGYL) 500 MG tablet Take 1 tablet (500 mg total) by mouth 2 (two) times daily. 03/26/21   Vanessa Kick, MD  Multiple Vitamin (MULTIVITAMIN WITH MINERALS) TABS tablet Take 1 tablet by mouth daily. 11/11/20   Ethelene Hal, NP      Allergies    Monistat [tioconazole], Chocolate, Ibuprofen, and Banana    Review of Systems   Review of Systems  Constitutional:  Positive for chills and fever.  HENT:  Negative for congestion, ear pain and sore throat.   Respiratory:  Negative for shortness of breath.   Cardiovascular:  Negative for chest pain.   Gastrointestinal:  Positive for abdominal pain and nausea. Negative for diarrhea and vomiting.  Genitourinary:  Positive for flank pain, frequency and urgency. Negative for dysuria.  Neurological:  Negative for syncope.  All other systems reviewed and are negative.  Physical Exam Updated Vital Signs BP (!) 134/95 (BP Location: Right Arm)    Pulse (!) 116    Temp 100.1 F (37.8 C) (Oral)    Resp (!) 22    Ht 5\' 3"  (1.6 m)    Wt 95.3 kg    SpO2 100%    BMI 37.20 kg/m  Physical Exam Vitals and nursing note reviewed.  Constitutional:      General: She is not in acute distress.    Appearance: She is well-developed. She is not toxic-appearing.  HENT:     Head: Normocephalic and atraumatic.  Eyes:     General:        Right eye: No discharge.        Left eye: No discharge.     Conjunctiva/sclera: Conjunctivae normal.  Cardiovascular:     Rate and Rhythm: Regular rhythm. Tachycardia present.  Pulmonary:     Effort: Pulmonary effort is normal. No respiratory distress.     Breath sounds: Normal breath sounds. No wheezing, rhonchi or rales.  Abdominal:     General: There is no distension.     Palpations: Abdomen is soft.     Tenderness: There is abdominal tenderness (left abdomen). There is  left CVA tenderness. There is no right CVA tenderness, guarding or rebound.  Musculoskeletal:     Cervical back: Neck supple.  Skin:    General: Skin is warm and dry.     Findings: No rash.  Neurological:     Mental Status: She is alert.     Comments: Clear speech.   Psychiatric:        Behavior: Behavior normal.    ED Results / Procedures / Treatments   Labs (all labs ordered are listed, but only abnormal results are displayed) Labs Reviewed  BASIC METABOLIC PANEL - Abnormal; Notable for the following components:      Result Value   Sodium 133 (*)    Potassium 3.4 (*)    Glucose, Bld 168 (*)    All other components within normal limits  CBC - Abnormal; Notable for the following  components:   WBC 12.6 (*)    Hemoglobin 10.2 (*)    HCT 32.2 (*)    RDW 17.2 (*)    All other components within normal limits  URINE CULTURE  RESP PANEL BY RT-PCR (FLU A&B, COVID) ARPGX2  URINALYSIS, ROUTINE W REFLEX MICROSCOPIC  I-STAT BETA HCG BLOOD, ED ([facility], [facility], [facility] ONLY)  CBG MONITORING, ED    EKG None  Radiology No results found.  Procedures Procedures    Medications Ordered in ED Medications - No data to display  ED Course/ Medical Decision Making/ A&P                           Medical Decision Making Amount and/or Complexity of Data Reviewed Labs: ordered. Radiology: ordered.  Risk OTC drugs. Prescription drug management.   Patient presents to the ED with complaints of urinary sxs w/ fever and flank pain, this involves an extensive number of treatment options, and is a complaint that carries with it a high risk of complications and morbidity. Nontoxic, vitals w/ tachycardia and borderline temp of 100.1.   Additional history obtained:  Chart review & nursing note review.  Hx of ibuprofen allergy- patient reports she tolerates other NSAIDs fine.   Lab Tests:  I reviewed & interpreted labs including:  CBC: mild leukocytosis & anemia.  BMP:  Mild hypokalemia/natremia w/ hyperglycemia, no critical electrolyte derangements, renal function preserved.  UA: Notably infected, some contamination, however nitrite positive w/ large leukocytes Pregnancy test: Negative.   I have ordered fluids for hydration, tylenol for pain/fever, and zofran for nausea. Toradol ordered for pain- patient has hx of ibuprofen allergy which she reports is that her feet swell- she tolerates other nsaids without difficulty.  Plan for CT renal stone study---> patient subsequently refused CT imaging, risks/benefits discussed with the patient, declines scan, suspicion for stone is low.   ED Course:  Some delay in patient receiving medication due to assigned RN being with another critical  patient.   04:00: RE-EVAL: Patient is feeling much better, she is tolerating PO, her tachycardia is improved.   Suspect pyelonephritis, overall well appearing and seems appropriate for trial of outpatient abx with additional supportive care. PCP follow up w/ strict return precautions. I discussed results, treatment plan, need for follow-up, and return precautions with the patient. Provided opportunity for questions, patient confirmed understanding and is in agreement with plan. Discussed w/ Dr. Melina Copa.  Blood pressure 101/61, pulse 85, temperature 98 F (36.7 C), temperature source Oral, resp. rate 19, height 5\' 3"  (1.6 m), weight 95.3 kg, SpO2 100 %.  Portions of this note were generated with [employer]. Dictation errors may occur despite best attempts at proofreading.  Final Clinical Impression(s) / ED Diagnoses Final diagnoses:  Pyelonephritis  Hyperglycemia  Anemia, unspecified type    Rx / DC Orders ED Discharge Orders          Ordered    cefpodoxime (VANTIN) 200 MG tablet  2 times daily        05/04/21 0432    ondansetron (ZOFRAN-ODT) 4 MG disintegrating tablet  Every 8 hours PRN        05/04/21 0432                   Amaryllis Dyke, PA-C 05/04/21 0523    Hayden Rasmussen, MD 05/04/21 1006

## 2021-05-04 LAB — RESP PANEL BY RT-PCR (FLU A&B, COVID) ARPGX2
Influenza A by PCR: NEGATIVE
Influenza B by PCR: NEGATIVE
SARS Coronavirus 2 by RT PCR: NEGATIVE

## 2021-05-04 MED ORDER — SODIUM CHLORIDE 0.9 % IV BOLUS
1000.0000 mL | Freq: Once | INTRAVENOUS | Status: AC
  Administered 2021-05-04: 1000 mL via INTRAVENOUS

## 2021-05-04 MED ORDER — KETOROLAC TROMETHAMINE 15 MG/ML IJ SOLN
15.0000 mg | Freq: Once | INTRAMUSCULAR | Status: AC
  Administered 2021-05-04: 15 mg via INTRAVENOUS
  Filled 2021-05-04: qty 1

## 2021-05-04 MED ORDER — ONDANSETRON 4 MG PO TBDP: 4.0000 mg | ORAL_TABLET | Freq: Three times a day (TID) | ORAL | 0 refills | Status: DC | PRN

## 2021-05-04 MED ORDER — CEFPODOXIME PROXETIL 200 MG PO TABS: 200.0000 mg | ORAL_TABLET | Freq: Two times a day (BID) | ORAL | 0 refills | Status: DC

## 2021-05-04 MED ORDER — NAPROXEN 500 MG PO TABS: 500.0000 mg | ORAL_TABLET | Freq: Two times a day (BID) | ORAL | 0 refills | Status: DC | PRN

## 2021-05-04 NOTE — Discharge Instructions (Addendum)
You were seen in the ER today and found to have a kidney infection.  We are treating the infection with Cefpodoxime which is an antibiotic- please take this as prescribed.   Also take zofran every 8 hours as needed for nausea/vomiting.    Take tylenol as needed for pain/fevers.   We have prescribed you new medication(s) today. Discuss the medications prescribed today with your pharmacist as they can have adverse effects and interactions with your other medicines including over the counter and prescribed medications. Seek medical evaluation if you start to experience new or abnormal symptoms after taking one of these medicines, seek care immediately if you start to experience difficulty breathing, feeling of your throat closing, facial swelling, or rash as these could be indications of a more serious allergic reaction  Your labs showed your blood sugar was elevated some and you have mild anemia- have these rechecked by your primary care provider.    Follow up with primary care within 3 days. Return to the er for new or worsening symptoms including but not limited to new or worsening pain, inability to keep fluids down, passing out, fever after 48 hours of antibiotics, or any other concerns.

## 2021-05-07 LAB — URINE CULTURE: Culture: >=100000 — AB

## 2021-05-08 ENCOUNTER — Telehealth: Payer: Self-pay | Admitting: *Deleted

## 2021-05-08 NOTE — Telephone Encounter (Signed)
Post ED Visit - Positive Culture Follow-up ? ?Culture report reviewed by antimicrobial stewardship pharmacist: ?[facility] Team ?[]  Elenor Quinones, Pharm.D. ?[]  Heide Guile, Pharm.D., BCPS AQ-ID ?[]  Parks Neptune, Pharm.D., BCPS ?[]  Alycia Rossetti, Pharm.D., BCPS ?[]  [place], Pharm.D., BCPS, AAHIVP ?[]  Legrand Como, Pharm.D., BCPS, AAHIVP ?[]  Salome Arnt, PharmD, BCPS ?[]  Johnnette Gourd, PharmD, BCPS ?[]  Hughes Better, PharmD, BCPS ?[]  Leeroy Cha, PharmD ?[]  Laqueta Linden, PharmD, BCPS ?[x]  Albertina Parr, PharmD ? ?[facility] Team ?[]  Leodis Sias, PharmD ?[]  Lindell Spar, PharmD ?[]  Royetta Asal, PharmD ?[]  Graylin Shiver, Rph ?[]  Rema Fendt) Glennon Mac, PharmD ?[]  Arlyn Dunning, PharmD ?[]  Netta Cedars, PharmD ?[]  Dia Sitter, PharmD ?[]  Leone Haven, PharmD ?[]  Gretta Arab, PharmD ?[]  Theodis Shove, PharmD ?[]  Peggyann Juba, PharmD ?[]  Reuel Boom, PharmD ? ? ?Positive urine culture ?Treated with Cefpodoxime Proxetil, organism sensitive to the same and no further patient follow-up is required at this time. ? ?Ardeen Fillers ?05/08/2021, 10:26 AM ?  ?

## 2021-05-09 LAB — CULTURE, BLOOD (ROUTINE X 2)
Culture: NO GROWTH
Culture: NO GROWTH
Special Requests: ADEQUATE

## 2021-05-19 ENCOUNTER — Other Ambulatory Visit: Payer: Self-pay | Admitting: Family Medicine

## 2021-05-19 DIAGNOSIS — E1169 Type 2 diabetes mellitus with other specified complication: Secondary | ICD-10-CM

## 2021-05-19 DIAGNOSIS — E1165 Type 2 diabetes mellitus with hyperglycemia: Secondary | ICD-10-CM

## 2021-05-20 ENCOUNTER — Other Ambulatory Visit: Payer: Self-pay | Admitting: Family Medicine

## 2021-05-20 DIAGNOSIS — E1165 Type 2 diabetes mellitus with hyperglycemia: Secondary | ICD-10-CM

## 2021-05-20 DIAGNOSIS — E1169 Type 2 diabetes mellitus with other specified complication: Secondary | ICD-10-CM

## 2021-05-20 NOTE — Telephone Encounter (Signed)
[facility] before further refills  

## 2021-06-04 ENCOUNTER — Ambulatory Visit: Payer: Medicaid Other | Admitting: Family Medicine

## 2021-06-10 ENCOUNTER — Encounter (HOSPITAL_COMMUNITY): Payer: Self-pay | Admitting: Registered Nurse

## 2021-06-10 ENCOUNTER — Ambulatory Visit (HOSPITAL_COMMUNITY)
Admission: EM | Admit: 2021-06-10 | Discharge: 2021-06-10 | Disposition: A | Discharge status: Home / Self Care | Payer: Medicaid Other | Attending: Registered Nurse | Admitting: Registered Nurse

## 2021-06-10 DIAGNOSIS — Z592 Discord with neighbors, lodgers and landlord: Secondary | ICD-10-CM | POA: Insufficient documentation

## 2021-06-10 DIAGNOSIS — F10929 Alcohol use, unspecified with intoxication, unspecified: Secondary | ICD-10-CM | POA: Diagnosis present

## 2021-06-10 DIAGNOSIS — F1014 Alcohol abuse with alcohol-induced mood disorder: Secondary | ICD-10-CM | POA: Diagnosis not present

## 2021-06-10 DIAGNOSIS — F10129 Alcohol abuse with intoxication, unspecified: Secondary | ICD-10-CM | POA: Insufficient documentation

## 2021-06-10 DIAGNOSIS — Z59819 Housing instability, housed unspecified: Secondary | ICD-10-CM | POA: Insufficient documentation

## 2021-06-10 DIAGNOSIS — Z733 Stress, not elsewhere classified: Secondary | ICD-10-CM | POA: Diagnosis not present

## 2021-06-10 HISTORY — DX: Alcohol use, unspecified with intoxication, unspecified: F10.929
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
## 2021-06-10 NOTE — ED Notes (Signed)
Pt escorted to lobby to await taxi to transport pt to home. Verbalized understanding of discharge instructions reviewed on AVS. Safety maintained. ?

## 2021-07-02 ENCOUNTER — Other Ambulatory Visit: Payer: Self-pay

## 2021-07-02 ENCOUNTER — Encounter: Payer: Self-pay | Admitting: Family Medicine

## 2021-07-02 DIAGNOSIS — E1165 Type 2 diabetes mellitus with hyperglycemia: Secondary | ICD-10-CM

## 2021-07-02 DIAGNOSIS — E1169 Type 2 diabetes mellitus with other specified complication: Secondary | ICD-10-CM

## 2021-07-06 ENCOUNTER — Telehealth (HOSPITAL_COMMUNITY): Payer: Self-pay | Admitting: Family Medicine

## 2021-07-06 NOTE — BH Assessment (Signed)
Care Management - [facility]UC Follow Up Discharges  ° °Writer attempted to make contact with patient today and was unsuccessful.  Writer left a HIPPA compliant voice message.  ° °Per chart review, patient was provided with outpatient resources. ° °

## 2021-07-10 ENCOUNTER — Other Ambulatory Visit: Payer: Self-pay | Admitting: Family Medicine

## 2021-07-10 DIAGNOSIS — E1169 Type 2 diabetes mellitus with other specified complication: Secondary | ICD-10-CM

## 2021-07-10 DIAGNOSIS — E1165 Type 2 diabetes mellitus with hyperglycemia: Secondary | ICD-10-CM

## 2021-07-10 NOTE — Telephone Encounter (Signed)
Patient calls nurse X4 in regards to metformin refill.  ? ?I explained to patient it has been over one year since she has been seen for DM and has no showed her last two apts.  ? ?Patient stated, "so if I stroke out its yalls fault."  ? ?Advised patient the best option would be to have seen earlier than scheduled apt with PCP on 5/25. ? ?Patient asking for a partial refill until then or will come to scheduled apt on 5/8. ? ?Will forward to PCP.   ?

## 2021-07-13 ENCOUNTER — Ambulatory Visit (INDEPENDENT_AMBULATORY_CARE_PROVIDER_SITE_OTHER): Payer: Medicaid Other | Admitting: Family Medicine

## 2021-07-13 ENCOUNTER — Other Ambulatory Visit: Payer: Self-pay | Admitting: Family Medicine

## 2021-07-13 ENCOUNTER — Encounter: Payer: Self-pay | Admitting: Family Medicine

## 2021-07-13 DIAGNOSIS — E119 Type 2 diabetes mellitus without complications: Secondary | ICD-10-CM | POA: Diagnosis not present

## 2021-07-13 DIAGNOSIS — E1165 Type 2 diabetes mellitus with hyperglycemia: Secondary | ICD-10-CM | POA: Diagnosis not present

## 2021-07-13 DIAGNOSIS — Z87891 Personal history of nicotine dependence: Secondary | ICD-10-CM | POA: Diagnosis not present

## 2021-07-13 DIAGNOSIS — E1169 Type 2 diabetes mellitus with other specified complication: Secondary | ICD-10-CM | POA: Diagnosis not present

## 2021-07-13 LAB — POCT GLYCOSYLATED HEMOGLOBIN (HGB A1C): HbA1c, POC (controlled diabetic range): 6.1 % (ref 0.0–7.0)

## 2021-07-13 MED ORDER — METFORMIN HCL 1000 MG PO TABS: ORAL_TABLET | ORAL | 0 refills | Status: DC

## 2021-07-13 NOTE — Assessment & Plan Note (Signed)
Reports cessation 5 months prior. Congratulated patient on the accomplishment.  ?

## 2021-07-13 NOTE — Progress Notes (Signed)
    SUBJECTIVE:   CHIEF COMPLAINT / HPI:   Diabetes - Needs another referral to optho - Has been compliant with Jardiance and Metformin (though has been out of metformin due to needing an appointment before refill).  Allergies Taking zyrtec with some improvement in congestive symptoms.   Headaches Reporting headaches that occur at night and are sometimes worse with laying flat. The headaches quickly resolve and do not have any other associated symptoms. Improves with taking Aleve. Patient believes may be associated with eating too much food, having food with too much spices, or with not having her medications.   Tobacco cessation Patient reports that she quit smoking cigarettes about 5 months prior.   Alcohol: every other weekend, more than 5 drinks during that time.  Not using marijuana  PERTINENT  PMH / PSH: Reviewed  OBJECTIVE:   BP 104/64   Pulse (!) 107   Ht '5\' 3"'$  (1.6 m)   Wt 194 lb 12.8 oz (88.4 kg)   LMP 06/27/2021 (Approximate)   SpO2 99%   BMI 34.51 kg/m   General: NAD, well-appearing, well-nourished Respiratory: No respiratory distress, breathing comfortably, able to speak in full sentences Skin: warm and dry, no rashes noted on exposed skin Psych: Appropriate affect and mood  ASSESSMENT/PLAN:   Type 2 diabetes mellitus without complication, without long-term current use of insulin (HCC) A1c today 6.1. Stable with no current concerns.  - Metformin refill provided - Continue Jardiance '25mg'$  daily - Repeat A1c in 3-6 months - Optho referral placed per patient request  History of tobacco use Reports cessation 5 months prior. Congratulated patient on the accomplishment.    Headaches Unclear etiology, headaches occur in many different settings with several different potential triggers. Reassuringly there are no other associated symptoms and are very self-limited. Consider a headache diary to more consistently characterize timing and potential triggers.  -  Continue OTC ibuprofen/tylenol as needed for headaches - Return and ER precautions given   Grace Montgomery, [place]

## 2021-07-13 NOTE — Assessment & Plan Note (Addendum)
A1c today 6.1. Stable with no current concerns.  ?- Metformin refill provided ?- Continue Jardiance '25mg'$  daily ?- Repeat A1c in 3-6 months ?- Optho referral placed per patient request ?

## 2021-07-13 NOTE — Patient Instructions (Signed)
Your A1c today was well controlled at 6.1.  I have sent in for your refill of metformin.  Please him and if he have any issues with it. ? ?If your headaches continue to worsen or do not improve, please make sure to bring this up with your PCP at your next visit.  If you have severe headache or very concerned we can also consider going to an urgent care or the ER. ?

## 2021-07-30 ENCOUNTER — Ambulatory Visit (INDEPENDENT_AMBULATORY_CARE_PROVIDER_SITE_OTHER): Payer: Medicaid Other | Admitting: Family Medicine

## 2021-07-30 ENCOUNTER — Encounter: Payer: Self-pay | Admitting: Family Medicine

## 2021-07-30 VITALS — BP 113/85 | HR 98 | Ht 63.0 in | Wt 190.4 lb

## 2021-07-30 DIAGNOSIS — E1165 Type 2 diabetes mellitus with hyperglycemia: Secondary | ICD-10-CM

## 2021-07-30 DIAGNOSIS — L989 Disorder of the skin and subcutaneous tissue, unspecified: Secondary | ICD-10-CM

## 2021-07-30 DIAGNOSIS — L609 Nail disorder, unspecified: Secondary | ICD-10-CM

## 2021-07-30 DIAGNOSIS — E1169 Type 2 diabetes mellitus with other specified complication: Secondary | ICD-10-CM

## 2021-07-30 DIAGNOSIS — B350 Tinea barbae and tinea capitis: Secondary | ICD-10-CM

## 2021-07-30 DIAGNOSIS — E119 Type 2 diabetes mellitus without complications: Secondary | ICD-10-CM

## 2021-07-30 MED ORDER — METFORMIN HCL 1000 MG PO TABS: 1000.0000 mg | ORAL_TABLET | Freq: Two times a day (BID) | ORAL | 3 refills | Status: DC

## 2021-07-30 MED ORDER — KETOCONAZOLE 2 % EX SHAM: 1.0000 "application " | MEDICATED_SHAMPOO | CUTANEOUS | 4 refills | Status: DC

## 2021-07-30 MED ORDER — EMPAGLIFLOZIN 25 MG PO TABS: 25.0000 mg | ORAL_TABLET | Freq: Every day | ORAL | 3 refills | Status: DC

## 2021-07-30 MED ORDER — GRISEOFULVIN MICROSIZE 500 MG PO TABS: 500.0000 mg | ORAL_TABLET | Freq: Every day | ORAL | 0 refills | Status: AC

## 2021-07-30 NOTE — Patient Instructions (Signed)
I am concerned that your scalp rash is Tinea Capitis (Ring Worm).  I would like you to take Griseofulvin (antifungal used for Tinea Capitis).  Take on tablet a day for 30 days.  It should start to show some improvement within 14 days.   Try Nizoral (ketoconozole) shampoo to help remove the scaling from your scalp.  Use it daily for 5 days then every other day until you complete the Griseofulvin medication.     A referral was sent for a dermatology consult to look and your fingernails and toe nails.   You blood sugar is under great control (A1c is 6.1%).  With your diet changes and weight loss you may be able to stop one of the two diabetes medications in the future.    Another referral was sent to the eye doctor for you.

## 2021-07-31 ENCOUNTER — Encounter: Payer: Self-pay | Admitting: Family Medicine

## 2021-07-31 NOTE — Assessment & Plan Note (Signed)
Established problem Uncontrolled.  Patient is not at goal of resolution of nail deformities. Ms Tawni Pummel was treated with terbinafine oral for six weeks about 15 months ago.  Fungal culture of the fingernails showed no growth.  She did not see improvement of her finger nails or toe nails.  In fact, she has seen progression of the dystrophic nails to all her fingernails and toe nails.  Her accompanying circular scalp lesions have become more prominent.  Her Finger nails show dystrophic changes across the entire nail in all finger nails.  Her toe nails show similar generalized changes.   Her scalp is partially covered by a wig which is glued down on her frontal scalp, but I am able to see her posterior scalp that has 5 to 6 circular white scaling patches with hair breakage. The circular patches are uniformly covered with whitish scales.  There is not posterior/occipital LAN.   There are no significant skin changes or elbows or knees  My differential remains the same it was 15 months ago between Tinea unguium vs Psoriasis.  The scalp lesions look so much like tinea that I would recommended Ms Orona try a trial of griseofulvin oral along with Nizoral shapoo to help with scalp itching and unsightliness.   A referral to dermatology was sent for diagnosis and treatment.   RTC 4 week to see how scalp is doing.

## 2021-07-31 NOTE — Progress Notes (Signed)
Grace Montgomery is alone Sources of clinical information for visit is/are patient. Nursing assessment for this office visit was reviewed with the patient for accuracy and revision.     Previous Report(s) Reviewed: none     07/30/2021    2:27 PM  Depression screen PHQ 2/9  Decreased Interest 0  Down, Depressed, Hopeless 0  PHQ - 2 Score 0  Altered sleeping 0  Tired, decreased energy 0  Change in appetite 0  Feeling bad or failure about yourself  0  Trouble concentrating 0  Moving slowly or fidgety/restless 0  Suicidal thoughts 0  PHQ-9 Score 0  Difficult doing work/chores Not difficult at all   [place] Visit from 07/30/2021 in [place] Office Visit from 07/13/2021 in [place] Office Visit from 04/17/2020 in [place]  Thoughts that you would be better off dead, or of hurting yourself in some way Not at all Not at all Not at all  PHQ-9 Total Score 0 7 10          04/11/2018    2:47 PM 03/13/2018    2:51 PM 02/13/2018    2:31 PM 01/26/2018    3:04 PM  [place] in the past year? 0 0 0 0       07/30/2021    2:27 PM 07/13/2021    1:39 PM 04/17/2020    4:11 PM  PHQ9 SCORE ONLY  PHQ-9 Total Score 0 7 10    Adult vaccines due  Topic Date Due   TETANUS/TDAP  04/19/2028    Health Maintenance Due  Topic Date Due   OPHTHALMOLOGY EXAM  Never done   URINE MICROALBUMIN  11/07/2020   PAP SMEAR-Modifier  01/12/2021   FOOT EXAM  04/17/2021      History/P.E. limitations: none  Adult vaccines due  Topic Date Due   TETANUS/TDAP  04/19/2028    Diabetes Health Maintenance Due  Topic Date Due   OPHTHALMOLOGY EXAM  Never done   URINE MICROALBUMIN  11/07/2020   FOOT EXAM  04/17/2021   HEMOGLOBIN A1C  01/13/2022    Health Maintenance Due  Topic Date Due   OPHTHALMOLOGY EXAM  Never done   URINE MICROALBUMIN  11/07/2020   PAP SMEAR-Modifier  01/12/2021   FOOT EXAM  04/17/2021      Chief Complaint  Patient presents with   Diabetes   Nail Problem

## 2021-07-31 NOTE — Assessment & Plan Note (Signed)
Established problem worsened.  See nail disorder for details

## 2021-07-31 NOTE — Assessment & Plan Note (Signed)
Established problem Well Controlled and is at goal of A1c <6.5%. No signs of complications, medication side effects, or red flags. Continue Metformin 1000 mg BID and empagliflozin 25 mg daily  We discussed reducing or stopping one of her diabetes meds in future

## 2021-08-11 ENCOUNTER — Encounter: Payer: Self-pay | Admitting: *Deleted

## 2021-10-11 ENCOUNTER — Ambulatory Visit (HOSPITAL_COMMUNITY)
Admission: EM | Admit: 2021-10-11 | Discharge: 2021-10-11 | Disposition: A | Discharge status: Home / Self Care | Payer: Medicaid Other | Attending: Psychiatry | Admitting: Psychiatry

## 2021-10-11 DIAGNOSIS — F10920 Alcohol use, unspecified with intoxication, uncomplicated: Secondary | ICD-10-CM

## 2021-10-11 DIAGNOSIS — F32A Depression, unspecified: Secondary | ICD-10-CM | POA: Insufficient documentation
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
## 2021-10-11 NOTE — ED Notes (Addendum)
Patient was discharged to home by provider. Patient was given a sandwich and juice prior to departure. Patient was given discharge instructions with AVS.

## 2021-10-11 NOTE — Discharge Instructions (Signed)
Please contact one of the following facilities to start medication management and therapy services:   [facility] ([place]  [place], [place]  [address] Phone: [phone]  [facility]  [address], [place] [address] Phone: [phone]  [facility]  [address]  [place], [place] [address]  [facility]  [address], [place] [address] Phone: [phone]    The suicide prevention education provided includes the following: Suicide risk factors Suicide prevention and interventions National Suicide Hotline telephone number [facility] assessment telephone number [facility] Emergency Assistance [place] and/or Residential Mobile Crisis Unit telephone number   Request made of family/significant other to: Remove weapons (e.g., guns, rifles, knives), all items previously/currently identified as safety concern.   Remove drugs/medications (over the counter, prescriptions, illicit drugs), all items previously/currently identified as a safety concern.

## 2021-10-20 ENCOUNTER — Telehealth (HOSPITAL_COMMUNITY): Payer: Self-pay | Admitting: Family Medicine

## 2021-10-20 NOTE — BH Assessment (Signed)
Care Management - [place]  Follow Up Discharges   Writer attempted to make contact with patient today and was unsuccessful.  Writer left a HIPPA compliant voice message.   Per chart review, patient She has will follow up with established provider at [place] for medication management and therapy.

## 2021-11-26 ENCOUNTER — Other Ambulatory Visit: Payer: Self-pay | Admitting: Family Medicine

## 2021-11-26 DIAGNOSIS — E1169 Type 2 diabetes mellitus with other specified complication: Secondary | ICD-10-CM

## 2022-01-09 ENCOUNTER — Emergency Department (HOSPITAL_COMMUNITY)
Admission: EM | Admit: 2022-01-09 | Discharge: 2022-01-09 | Disposition: A | Discharge status: Home / Self Care | Payer: Medicaid Other | Attending: Emergency Medicine | Admitting: Emergency Medicine

## 2022-01-09 ENCOUNTER — Emergency Department (HOSPITAL_COMMUNITY): Payer: Medicaid Other

## 2022-01-09 ENCOUNTER — Encounter (HOSPITAL_COMMUNITY): Payer: Self-pay | Admitting: *Deleted

## 2022-01-09 ENCOUNTER — Other Ambulatory Visit: Payer: Self-pay

## 2022-01-09 DIAGNOSIS — R109 Unspecified abdominal pain: Secondary | ICD-10-CM | POA: Diagnosis present

## 2022-01-09 DIAGNOSIS — E119 Type 2 diabetes mellitus without complications: Secondary | ICD-10-CM | POA: Diagnosis not present

## 2022-01-09 DIAGNOSIS — K8020 Calculus of gallbladder without cholecystitis without obstruction: Secondary | ICD-10-CM | POA: Diagnosis not present

## 2022-01-09 LAB — CBC WITH DIFFERENTIAL/PLATELET
Abs Immature Granulocytes: 0.04 10*3/uL (ref 0.00–0.07)
Basophils Absolute: 0.1 10*3/uL (ref 0.0–0.1)
Basophils Relative: 0 %
Eosinophils Absolute: 0.2 10*3/uL (ref 0.0–0.5)
Eosinophils Relative: 1 %
HCT: 28.9 % — ABNORMAL LOW (ref 36.0–46.0)
Hemoglobin: 9.2 g/dL — ABNORMAL LOW (ref 12.0–15.0)
Immature Granulocytes: 0 %
Lymphocytes Relative: 17 %
Lymphs Abs: 2.3 10*3/uL (ref 0.7–4.0)
MCH: 26 pg (ref 26.0–34.0)
MCHC: 31.8 g/dL (ref 30.0–36.0)
MCV: 81.6 fL (ref 80.0–100.0)
Monocytes Absolute: 0.6 10*3/uL (ref 0.1–1.0)
Monocytes Relative: 5 %
Neutro Abs: 10.2 10*3/uL — ABNORMAL HIGH (ref 1.7–7.7)
Neutrophils Relative %: 77 %
Platelets: 494 10*3/uL — ABNORMAL HIGH (ref 150–400)
RBC: 3.54 MIL/uL — ABNORMAL LOW (ref 3.87–5.11)
RDW: 17.9 % — ABNORMAL HIGH (ref 11.5–15.5)
WBC: 13.4 10*3/uL — ABNORMAL HIGH (ref 4.0–10.5)
nRBC: 0 % (ref 0.0–0.2)

## 2022-01-09 LAB — COMPREHENSIVE METABOLIC PANEL
ALT: 19 U/L (ref 0–44)
AST: 25 U/L (ref 15–41)
Albumin: 3 g/dL — ABNORMAL LOW (ref 3.5–5.0)
Alkaline Phosphatase: 78 U/L (ref 38–126)
Anion gap: 7 (ref 5–15)
BUN: 8 mg/dL (ref 6–20)
CO2: 23 mmol/L (ref 22–32)
Calcium: 8.6 mg/dL — ABNORMAL LOW (ref 8.9–10.3)
Chloride: 105 mmol/L (ref 98–111)
Creatinine, Ser: 0.99 mg/dL (ref 0.44–1.00)
GFR, Estimated: >60 mL/min (ref >60)
Glucose, Bld: 176 mg/dL — ABNORMAL HIGH (ref 70–99)
Potassium: 3.6 mmol/L (ref 3.5–5.1)
Sodium: 135 mmol/L (ref 135–145)
Total Bilirubin: 0.2 mg/dL — ABNORMAL LOW (ref 0.3–1.2)
Total Protein: 6.8 g/dL (ref 6.5–8.1)

## 2022-01-09 LAB — LIPASE, BLOOD: Lipase: 28 U/L (ref 11–51)

## 2022-01-09 LAB — I-STAT BETA HCG BLOOD, ED (MC, WL, AP ONLY): I-stat hCG, quantitative: <5 m[IU]/mL (ref <5)

## 2022-01-09 MED ORDER — ONDANSETRON 4 MG PO TBDP
4.0000 mg | ORAL_TABLET | Freq: Once | ORAL | Status: AC
  Administered 2022-01-09: 4 mg via ORAL
  Filled 2022-01-09: qty 1

## 2022-01-09 MED ORDER — OXYCODONE-ACETAMINOPHEN 5-325 MG PO TABS
1.0000 | ORAL_TABLET | Freq: Once | ORAL | Status: AC
  Administered 2022-01-09: 1 via ORAL
  Filled 2022-01-09: qty 1

## 2022-01-09 MED ORDER — OXYCODONE-ACETAMINOPHEN 5-325 MG PO TABS: 1.0000 | ORAL_TABLET | Freq: Four times a day (QID) | ORAL | 0 refills | Status: DC | PRN

## 2022-01-09 MED ORDER — ONDANSETRON HCL 4 MG PO TABS: 4.0000 mg | ORAL_TABLET | Freq: Four times a day (QID) | ORAL | 0 refills | Status: DC

## 2022-01-09 NOTE — Discharge Instructions (Addendum)
Please take tylenol/ibuprofen or Percocet as needed for pain. Take zofran for nausea. Follow soft food eating plan and avoid fatty food. I recommend close follow-up with PCP for reevaluation.  Please do not hesitate to return to emergency department if worrisome signs symptoms we discussed become apparent.

## 2022-01-09 NOTE — ED Provider Triage Note (Signed)
Emergency Medicine Provider Triage Evaluation Note  Grace Montgomery , a 31 y.o. female  was evaluated in triage.  Pt complains of right upper abdominal pain that started this evening around 10:00 after she ate some sesame chicken.  States that she regularly has this pain after she eats greasy or fried foods most recently 2 days ago, however the pain usually self resolves whereas this evening it is more severe and not resolving.  1 episode of NBNB emesis without nausea.  Review of Systems  Positive: As above Negative: fevers or chills  Physical Exam  BP 137/77   Pulse 75   Temp 98.4 F (36.9 C) (Oral)   Resp 16   SpO2 100%  Gen:   Awake, uncomfortable appearing Resp:  Normal effort  MSK:   Moves extremities without difficulty  Other:  Right upper quadrant tenderness palpation with positive Murphy sign.  Abdomen otherwise soft nondistended nontender.  No CVAT.  Medical Decision Making  Medically screening exam initiated at 3:35 AM.  Appropriate orders placed.  SURY WENTWORTH was informed that the remainder of the evaluation will be completed by another provider, this initial triage assessment does not replace that evaluation, and the importance of remaining in the ED until their evaluation is complete.  This chart was dictated using voice recognition software, Dragon. Despite the best efforts of this provider to proofread and correct errors, errors may still occur which can change documentation meaning.    Emeline Darling, PA-C 01/09/22 [phone]

## 2022-01-09 NOTE — ED Triage Notes (Signed)
Right upper abdominal pain that radiates to the right mid back,  started around 10pm after eating sesame chicken. Vomited x 1. Pt says that she has had this pain before, and it usually goes away on its own or after vomiting.

## 2022-01-09 NOTE — ED Provider Notes (Signed)
[facility] EMERGENCY DEPARTMENT Provider Note   CSN: [id number] Arrival date & time: 01/09/22  6503     History  Chief Complaint  Patient presents with   Abdominal Pain    Grace Montgomery is a 31 y.o. female with a past medical history of type 2 diabetes presenting to the emergency department for evaluation of abdominal pain.  Patient states she has had right upper quadrant abdominal pain in the last few months that she eats certain foods.  The pain got worse last night and has not resolved since.  The pain is located on her right upper quadrant, sharp, intermittent and radiating to her back.  She has not tried thing for pain.  Endorses nausea without vomiting.  Fever, chest pain, shortness of breath, bowel changes, urinary symptoms, rash.   Abdominal Pain      Home Medications Prior to Admission medications   Medication Sig Start Date End Date Taking? Authorizing Provider  JARDIANCE 25 MG TABS tablet TAKE 1 TABLET(25 MG) BY MOUTH DAILY 11/27/21   McDiarmid, Blane Ohara, MD  ketoconazole (NIZORAL) 2 % shampoo Apply 1 application. topically 2 (two) times a week. 07/30/21   McDiarmid, Blane Ohara, MD  metFORMIN (GLUCOPHAGE) 1000 MG tablet Take 1 tablet (1,000 mg total) by mouth 2 (two) times daily with a meal. 07/30/21 07/25/22  McDiarmid, Blane Ohara, MD  Multiple Vitamin (MULTIVITAMIN WITH MINERALS) TABS tablet Take 1 tablet by mouth daily. 11/11/20   Ethelene Hal, NP      Allergies    Monistat [tioconazole], Chocolate, Ibuprofen, and Banana    Review of Systems   Review of Systems  Gastrointestinal:  Positive for abdominal pain.    Physical Exam Updated Vital Signs BP 137/77   Pulse 75   Temp 98.4 F (36.9 C) (Oral)   Resp 16   SpO2 100%  Physical Exam Vitals and nursing note reviewed.  Constitutional:      Appearance: Normal appearance.  HENT:     Head: Normocephalic and atraumatic.     Mouth/Throat:     Mouth: Mucous membranes are moist.  Eyes:      General: No scleral icterus. Cardiovascular:     Rate and Rhythm: Normal rate and regular rhythm.     Pulses: Normal pulses.     Heart sounds: Normal heart sounds.  Pulmonary:     Effort: Pulmonary effort is normal.     Breath sounds: Normal breath sounds.  Abdominal:     General: Abdomen is flat.     Palpations: Abdomen is soft.     Tenderness: There is no abdominal tenderness.     Comments: Tenderness to palpation to the right upper quadrant.  Musculoskeletal:        General: No deformity.  Skin:    General: Skin is warm.     Findings: No rash.  Neurological:     General: No focal deficit present.     Mental Status: She is alert.  Psychiatric:        Mood and Affect: Mood normal.     ED Results / Procedures / Treatments   Labs (all labs ordered are listed, but only abnormal results are displayed) Labs Reviewed  CBC WITH DIFFERENTIAL/PLATELET - Abnormal; Notable for the following components:      Result Value   WBC 13.4 (*)    RBC 3.54 (*)    Hemoglobin 9.2 (*)    HCT 28.9 (*)    RDW 17.9 (*)  Platelets 494 (*)    Neutro Abs 10.2 (*)    All other components within normal limits  COMPREHENSIVE METABOLIC PANEL - Abnormal; Notable for the following components:   Glucose, Bld 176 (*)    Calcium 8.6 (*)    Albumin 3.0 (*)    Total Bilirubin 0.2 (*)    All other components within normal limits  LIPASE, BLOOD  URINALYSIS, ROUTINE W REFLEX MICROSCOPIC  I-STAT BETA HCG BLOOD, ED ([facility], [facility], [facility] ONLY)    EKG None  Radiology US Abdomen Limited RUQ (LIVER/GB)  Result Date: 01/09/2022 CLINICAL DATA:  Right upper quadrant pain after eating EXAM: ULTRASOUND ABDOMEN LIMITED RIGHT UPPER QUADRANT COMPARISON:  None Available. FINDINGS: Gallbladder: Shadowing gallstone at the neck with sludge filling the gallbladder. No gallbladder wall thickening or pericholecystic edema. Common bile duct: Diameter: 3 mm Liver: No focal lesion identified. Within normal limits in parenchymal  echogenicity. Portal vein is patent on color Doppler imaging with normal direction of blood flow towards the liver. IMPRESSION: Cholelithiasis and extensive gallbladder sludge. No indication of acute cholecystitis. Electronically Signed   By: Jorje Guild M.D.   On: 01/09/2022 05:05    Procedures Procedures    Medications Ordered in ED Medications  oxyCODONE-acetaminophen (PERCOCET/ROXICET) 5-325 MG per tablet 1 tablet (1 tablet Oral Given 01/09/22 0348)  ondansetron (ZOFRAN-ODT) disintegrating tablet 4 mg (4 mg Oral Given 01/09/22 0349)    ED Course/ Medical Decision Making/ A&P                           Medical Decision Making  This patient presents to the ED for right upper quadrant abdominal pain, this involves an extensive number of treatment options, and is a complaint that carries with a high risk of complications and morbidity.  The differential diagnosis includes cholelithiasis, cholecystitis, pancreatitis, PUD, small bowel obstruction, ischemic bowel disease..  This is not an exhaustive list.  Comorbidities that complicate the patient evaluation See HPI  Social determinants of health NA  Additional history obtained: Additional history obtained from EMR. External records from outside source obtained and review including prior labs  Cardiac monitoring/EKG: The patient was maintained on a cardiac monitor.  I personally reviewed and interpreted the cardiac monitor which showed an underlying rhythm of: Sinus rhythm.  Lab tests: I ordered and personally interpreted labs.  The pertinent results include WBC 13.4. Hbg 9.2. Platelets normal. No electrolyte abnormalities noted. BUN, creatinine normal. No transaminitis.  Imaging studies: I ordered imaging studies including ultrasound right upper quadrant abdomen which showed evidence of cholelithiasis without cholecystitis. I personally reviewed, interpreted imaging and agree with the radiologist's  interpretations.  Problem list/ ED course/ Critical interventions/ Medical management: HPI: See above Vital signs within normal range and stable throughout visit. Laboratory/imaging studies significant for: See above. On physical examination, patient is afebrile and appears in no acute distress.  She does have tenderness to palpation to the right upper quadrant.  Abdomen is soft.  No rigidity.  No guarding.  Heart sounds normal.  Lungs are clear.  Zofran and Percocet ordered. Patient states her pain has improved.  [place] right upper quadrant showed cholelithiasis without evidence of cholecystitis.  Patient's clinical presentations and laboratory/imaging studies are most concerned for cholelithiasis.  Low suspicions for cholecystitis based on [place]. I sent a Rx of Percocet and nausea control.  Patient was able to tolerate p.o. during my reassessment.  I recommended soft food eating plan.  I advised patient to  follow-up with general surgery for further evaluation and management. Percocet and Zofran ordered. Reevaluation of the patient after these medications showed that the patient improved.   I have reviewed the patient home medicines and have made adjustments as needed.  Consultations obtained: I requested consultation with attending Dr. Maylon Peppers, and discussed lab and imaging findings as well as pertinent plan.  They recommend outpatient follow up with general  surgery.  Disposition Continued outpatient therapy. Follow-up with  recommended for reevaluation of symptoms. Treatment plan discussed with patient.  Pt acknowledged understanding was agreeable to the plan. Worrisome signs and symptoms were discussed with patient, and patient acknowledged understanding to return to the ED if they noticed these signs and symptoms. Patient was stable upon discharge.   This chart was dictated using voice recognition software.  Despite best efforts to proofread,  errors can occur which can change the documentation  meaning.          Final Clinical Impression(s) / ED Diagnoses Final diagnoses:  Calculus of gallbladder without cholecystitis without obstruction    Rx / DC Orders ED Discharge Orders          Ordered    oxyCODONE-acetaminophen (PERCOCET/ROXICET) 5-325 MG tablet  Every 6 hours PRN        01/09/22 0959    ondansetron (ZOFRAN) 4 MG tablet  Every 6 hours        01/09/22 0959              Rex Kras, PA 01/09/22 1023    Maylon Peppers [place], DO 01/09/22 1127

## 2022-01-11 ENCOUNTER — Other Ambulatory Visit: Payer: Self-pay

## 2022-01-11 ENCOUNTER — Observation Stay (HOSPITAL_BASED_OUTPATIENT_CLINIC_OR_DEPARTMENT_OTHER): Payer: Medicaid Other | Admitting: Anesthesiology

## 2022-01-11 ENCOUNTER — Encounter (HOSPITAL_COMMUNITY): Payer: Self-pay | Admitting: Emergency Medicine

## 2022-01-11 ENCOUNTER — Observation Stay (HOSPITAL_COMMUNITY): Payer: Medicaid Other | Admitting: Anesthesiology

## 2022-01-11 ENCOUNTER — Encounter (HOSPITAL_COMMUNITY)
Admission: EM | Disposition: A | Discharge status: Home / Self Care | Payer: Self-pay | Source: Home / Self Care | Attending: Emergency Medicine

## 2022-01-11 ENCOUNTER — Observation Stay (HOSPITAL_COMMUNITY)
Admission: EM | Admit: 2022-01-11 | Discharge: 2022-01-13 | Disposition: A | Discharge status: Home / Self Care | Payer: Medicaid Other | Attending: Surgery | Admitting: Surgery

## 2022-01-11 DIAGNOSIS — F17210 Nicotine dependence, cigarettes, uncomplicated: Secondary | ICD-10-CM | POA: Diagnosis not present

## 2022-01-11 DIAGNOSIS — K810 Acute cholecystitis: Secondary | ICD-10-CM

## 2022-01-11 DIAGNOSIS — Z7984 Long term (current) use of oral hypoglycemic drugs: Secondary | ICD-10-CM | POA: Diagnosis not present

## 2022-01-11 DIAGNOSIS — R1011 Right upper quadrant pain: Secondary | ICD-10-CM | POA: Diagnosis present

## 2022-01-11 DIAGNOSIS — J4530 Mild persistent asthma, uncomplicated: Secondary | ICD-10-CM | POA: Insufficient documentation

## 2022-01-11 DIAGNOSIS — K819 Cholecystitis, unspecified: Secondary | ICD-10-CM | POA: Diagnosis present

## 2022-01-11 DIAGNOSIS — K8000 Calculus of gallbladder with acute cholecystitis without obstruction: Principal | ICD-10-CM | POA: Insufficient documentation

## 2022-01-11 DIAGNOSIS — Z79899 Other long term (current) drug therapy: Secondary | ICD-10-CM | POA: Diagnosis not present

## 2022-01-11 DIAGNOSIS — E119 Type 2 diabetes mellitus without complications: Secondary | ICD-10-CM | POA: Insufficient documentation

## 2022-01-11 HISTORY — DX: Cholecystitis, unspecified: K81.9

## 2022-01-11 HISTORY — PX: CHOLECYSTECTOMY: SHX55

## 2022-01-11 LAB — URINALYSIS, ROUTINE W REFLEX MICROSCOPIC
Bilirubin Urine: NEGATIVE
Glucose, UA: NEGATIVE mg/dL
Ketones, ur: 5 mg/dL — AB
Leukocytes,Ua: NEGATIVE
Nitrite: NEGATIVE
Protein, ur: 30 mg/dL — AB
RBC / HPF: >50 RBC/hpf — ABNORMAL HIGH (ref 0–5)
Specific Gravity, Urine: 1.014 (ref 1.005–1.030)
pH: 7 (ref 5.0–8.0)

## 2022-01-11 LAB — CBC WITH DIFFERENTIAL/PLATELET
Abs Immature Granulocytes: 0.07 10*3/uL (ref 0.00–0.07)
Basophils Absolute: 0 10*3/uL (ref 0.0–0.1)
Basophils Relative: 0 %
Eosinophils Absolute: 0.1 10*3/uL (ref 0.0–0.5)
Eosinophils Relative: 1 %
HCT: 29.8 % — ABNORMAL LOW (ref 36.0–46.0)
Hemoglobin: 9.3 g/dL — ABNORMAL LOW (ref 12.0–15.0)
Immature Granulocytes: 1 %
Lymphocytes Relative: 8 %
Lymphs Abs: 1.1 10*3/uL (ref 0.7–4.0)
MCH: 25.8 pg — ABNORMAL LOW (ref 26.0–34.0)
MCHC: 31.2 g/dL (ref 30.0–36.0)
MCV: 82.5 fL (ref 80.0–100.0)
Monocytes Absolute: 0.8 10*3/uL (ref 0.1–1.0)
Monocytes Relative: 6 %
Neutro Abs: 11 10*3/uL — ABNORMAL HIGH (ref 1.7–7.7)
Neutrophils Relative %: 84 %
Platelets: 473 10*3/uL — ABNORMAL HIGH (ref 150–400)
RBC: 3.61 MIL/uL — ABNORMAL LOW (ref 3.87–5.11)
RDW: 17.5 % — ABNORMAL HIGH (ref 11.5–15.5)
WBC: 13.1 10*3/uL — ABNORMAL HIGH (ref 4.0–10.5)
nRBC: 0 % (ref 0.0–0.2)

## 2022-01-11 LAB — SURGICAL PCR SCREEN
MRSA, PCR: NEGATIVE
Staphylococcus aureus: NEGATIVE

## 2022-01-11 LAB — GLUCOSE, CAPILLARY
Glucose-Capillary: 126 mg/dL — ABNORMAL HIGH (ref 70–99)
Glucose-Capillary: 161 mg/dL — ABNORMAL HIGH (ref 70–99)
Glucose-Capillary: 201 mg/dL — ABNORMAL HIGH (ref 70–99)
Glucose-Capillary: 193 mg/dL — ABNORMAL HIGH (ref 70–99)
Glucose-Capillary: 214 mg/dL — ABNORMAL HIGH (ref 70–99)

## 2022-01-11 LAB — HEMOGLOBIN A1C
Hgb A1c MFr Bld: 6 % — ABNORMAL HIGH (ref 4.8–5.6)
Mean Plasma Glucose: 125.5 mg/dL

## 2022-01-11 LAB — COMPREHENSIVE METABOLIC PANEL
ALT: 127 U/L — ABNORMAL HIGH (ref 0–44)
AST: 138 U/L — ABNORMAL HIGH (ref 15–41)
Albumin: 3.1 g/dL — ABNORMAL LOW (ref 3.5–5.0)
Alkaline Phosphatase: 131 U/L — ABNORMAL HIGH (ref 38–126)
Anion gap: 7 (ref 5–15)
BUN: <5 mg/dL — ABNORMAL LOW (ref 6–20)
CO2: 24 mmol/L (ref 22–32)
Calcium: 8.7 mg/dL — ABNORMAL LOW (ref 8.9–10.3)
Chloride: 103 mmol/L (ref 98–111)
Creatinine, Ser: 0.7 mg/dL (ref 0.44–1.00)
GFR, Estimated: >60 mL/min (ref >60)
Glucose, Bld: 172 mg/dL — ABNORMAL HIGH (ref 70–99)
Potassium: 4.1 mmol/L (ref 3.5–5.1)
Sodium: 134 mmol/L — ABNORMAL LOW (ref 135–145)
Total Bilirubin: 1.7 mg/dL — ABNORMAL HIGH (ref 0.3–1.2)
Total Protein: 7.7 g/dL (ref 6.5–8.1)

## 2022-01-11 LAB — LIPASE, BLOOD: Lipase: 27 U/L (ref 11–51)

## 2022-01-11 LAB — HIV ANTIBODY (ROUTINE TESTING W REFLEX): HIV Screen 4th Generation wRfx: NONREACTIVE

## 2022-01-11 SURGERY — LAPAROSCOPIC CHOLECYSTECTOMY WITH INTRAOPERATIVE CHOLANGIOGRAM
Anesthesia: General

## 2022-01-11 MED ORDER — MORPHINE SULFATE (PF) 2 MG/ML IV SOLN
2.0000 mg | INTRAVENOUS | Status: DC | PRN
  Administered 2022-01-11: 2 mg via INTRAVENOUS
  Filled 2022-01-11: qty 1

## 2022-01-11 MED ORDER — OXYCODONE HCL 5 MG/5ML PO SOLN: 5.0000 mg | Freq: Once | ORAL | Status: DC | PRN

## 2022-01-11 MED ORDER — 0.9 % SODIUM CHLORIDE (POUR BTL) OPTIME
TOPICAL | Status: DC | PRN
  Administered 2022-01-11: 1000 mL

## 2022-01-11 MED ORDER — HYDROMORPHONE HCL 1 MG/ML IJ SOLN
1.0000 mg | Freq: Once | INTRAMUSCULAR | Status: AC
  Administered 2022-01-11: 1 mg via INTRAVENOUS
  Filled 2022-01-11: qty 1

## 2022-01-11 MED ORDER — ONDANSETRON HCL 4 MG/2ML IJ SOLN: 4.0000 mg | Freq: Four times a day (QID) | INTRAMUSCULAR | Status: DC | PRN

## 2022-01-11 MED ORDER — ONDANSETRON HCL 4 MG/2ML IJ SOLN: 4.0000 mg | Freq: Once | INTRAMUSCULAR | Status: DC | PRN

## 2022-01-11 MED ORDER — ONDANSETRON 4 MG PO TBDP: 4.0000 mg | ORAL_TABLET | Freq: Four times a day (QID) | ORAL | Status: DC | PRN

## 2022-01-11 MED ORDER — STERILE WATER FOR INJECTION IJ SOLN
INTRAMUSCULAR | Status: DC | PRN
  Administered 2022-01-11: 2 mL via INTRAVENOUS

## 2022-01-11 MED ORDER — SODIUM CHLORIDE 0.9 % IV SOLN
2.0000 g | Freq: Once | INTRAVENOUS | Status: AC
  Administered 2022-01-11: 2 g via INTRAVENOUS
  Filled 2022-01-11: qty 20

## 2022-01-11 MED ORDER — OXYCODONE HCL 5 MG PO TABS
5.0000 mg | ORAL_TABLET | Freq: Four times a day (QID) | ORAL | Status: DC | PRN
  Administered 2022-01-11 – 2022-01-12 (×2): 5 mg via ORAL
  Filled 2022-01-11 (×2): qty 1

## 2022-01-11 MED ORDER — ESMOLOL HCL 100 MG/10ML IV SOLN
INTRAVENOUS | Status: DC | PRN
  Administered 2022-01-11 (×4): 20 mg via INTRAVENOUS

## 2022-01-11 MED ORDER — INSULIN ASPART 100 UNIT/ML IJ SOLN
0.0000 [IU] | Freq: Three times a day (TID) | INTRAMUSCULAR | Status: DC
  Administered 2022-01-11 – 2022-01-12 (×2): 3 [IU] via SUBCUTANEOUS
  Administered 2022-01-12: 2 [IU] via SUBCUTANEOUS
  Administered 2022-01-13: 5 [IU] via SUBCUTANEOUS

## 2022-01-11 MED ORDER — ACETAMINOPHEN 500 MG PO TABS
1000.0000 mg | ORAL_TABLET | Freq: Four times a day (QID) | ORAL | Status: DC
  Administered 2022-01-11 – 2022-01-13 (×8): 1000 mg via ORAL
  Filled 2022-01-11 (×8): qty 2

## 2022-01-11 MED ORDER — FENTANYL CITRATE (PF) 100 MCG/2ML IJ SOLN
INTRAMUSCULAR | Status: AC
  Filled 2022-01-11: qty 2

## 2022-01-11 MED ORDER — BUPIVACAINE LIPOSOME 1.3 % IJ SUSP
INTRAMUSCULAR | Status: AC
  Filled 2022-01-11: qty 20

## 2022-01-11 MED ORDER — BUPIVACAINE-EPINEPHRINE 0.25% -1:200000 IJ SOLN
INTRAMUSCULAR | Status: DC | PRN
  Administered 2022-01-11: 30 mL

## 2022-01-11 MED ORDER — ROCURONIUM BROMIDE 10 MG/ML (PF) SYRINGE
PREFILLED_SYRINGE | INTRAVENOUS | Status: AC
  Filled 2022-01-11: qty 10

## 2022-01-11 MED ORDER — LACTATED RINGERS IV SOLN: INTRAVENOUS | Status: DC

## 2022-01-11 MED ORDER — AMISULPRIDE (ANTIEMETIC) 5 MG/2ML IV SOLN: 10.0000 mg | Freq: Once | INTRAVENOUS | Status: DC | PRN

## 2022-01-11 MED ORDER — TRAMADOL HCL 50 MG PO TABS: 50.0000 mg | ORAL_TABLET | Freq: Four times a day (QID) | ORAL | Status: DC | PRN

## 2022-01-11 MED ORDER — ONDANSETRON HCL 4 MG/2ML IJ SOLN
4.0000 mg | Freq: Once | INTRAMUSCULAR | Status: AC
  Administered 2022-01-11: 4 mg via INTRAVENOUS
  Filled 2022-01-11: qty 2

## 2022-01-11 MED ORDER — HYDROMORPHONE HCL 1 MG/ML IJ SOLN: 0.2500 mg | INTRAMUSCULAR | Status: DC | PRN

## 2022-01-11 MED ORDER — ENOXAPARIN SODIUM 40 MG/0.4ML IJ SOSY: 40.0000 mg | PREFILLED_SYRINGE | INTRAMUSCULAR | Status: DC

## 2022-01-11 MED ORDER — ORAL CARE MOUTH RINSE: 15.0000 mL | OROMUCOSAL | Status: DC | PRN

## 2022-01-11 MED ORDER — MIDAZOLAM HCL 5 MG/5ML IJ SOLN
INTRAMUSCULAR | Status: DC | PRN
  Administered 2022-01-11: 2 mg via INTRAVENOUS

## 2022-01-11 MED ORDER — SODIUM CHLORIDE 0.9 % IV SOLN
2.0000 g | INTRAVENOUS | Status: DC
  Administered 2022-01-12: 2 g via INTRAVENOUS
  Filled 2022-01-11: qty 20

## 2022-01-11 MED ORDER — DEXAMETHASONE SODIUM PHOSPHATE 10 MG/ML IJ SOLN
INTRAMUSCULAR | Status: DC | PRN
  Administered 2022-01-11: 5 mg via INTRAVENOUS

## 2022-01-11 MED ORDER — KCL IN DEXTROSE-NACL 20-5-0.45 MEQ/L-%-% IV SOLN
INTRAVENOUS | Status: DC
  Filled 2022-01-11: qty 1000

## 2022-01-11 MED ORDER — BUPIVACAINE LIPOSOME 1.3 % IJ SUSP
INTRAMUSCULAR | Status: DC | PRN
  Administered 2022-01-11: 20 mL

## 2022-01-11 MED ORDER — ONDANSETRON HCL 4 MG/2ML IJ SOLN
INTRAMUSCULAR | Status: DC | PRN
  Administered 2022-01-11: 4 mg via INTRAVENOUS

## 2022-01-11 MED ORDER — SCOPOLAMINE 1 MG/3DAYS TD PT72
MEDICATED_PATCH | TRANSDERMAL | Status: AC
  Filled 2022-01-11: qty 1

## 2022-01-11 MED ORDER — METHOCARBAMOL 1000 MG/10ML IJ SOLN: 500.0000 mg | Freq: Four times a day (QID) | INTRAVENOUS | Status: DC | PRN

## 2022-01-11 MED ORDER — ENOXAPARIN SODIUM 40 MG/0.4ML IJ SOSY
40.0000 mg | PREFILLED_SYRINGE | INTRAMUSCULAR | Status: DC
  Administered 2022-01-12: 40 mg via SUBCUTANEOUS
  Filled 2022-01-11: qty 0.4

## 2022-01-11 MED ORDER — FENTANYL CITRATE (PF) 100 MCG/2ML IJ SOLN
INTRAMUSCULAR | Status: DC | PRN
  Administered 2022-01-11: 50 ug via INTRAVENOUS
  Administered 2022-01-11: 100 ug via INTRAVENOUS
  Administered 2022-01-11: 50 ug via INTRAVENOUS

## 2022-01-11 MED ORDER — SODIUM CHLORIDE 0.9 % IV BOLUS
1000.0000 mL | Freq: Once | INTRAVENOUS | Status: AC
  Administered 2022-01-11: 1000 mL via INTRAVENOUS

## 2022-01-11 MED ORDER — CHLORHEXIDINE GLUCONATE 0.12 % MT SOLN
15.0000 mL | Freq: Once | OROMUCOSAL | Status: AC
  Administered 2022-01-11: 15 mL via OROMUCOSAL

## 2022-01-11 MED ORDER — ALBUTEROL SULFATE HFA 108 (90 BASE) MCG/ACT IN AERS
INHALATION_SPRAY | RESPIRATORY_TRACT | Status: DC | PRN
  Administered 2022-01-11 (×2): 2 via RESPIRATORY_TRACT

## 2022-01-11 MED ORDER — ONDANSETRON HCL 4 MG/2ML IJ SOLN
INTRAMUSCULAR | Status: AC
  Filled 2022-01-11: qty 2

## 2022-01-11 MED ORDER — ROCURONIUM BROMIDE 10 MG/ML (PF) SYRINGE
PREFILLED_SYRINGE | INTRAVENOUS | Status: DC | PRN
  Administered 2022-01-11: 50 mg via INTRAVENOUS

## 2022-01-11 MED ORDER — PROPOFOL 10 MG/ML IV BOLUS
INTRAVENOUS | Status: DC | PRN
  Administered 2022-01-11: 200 mg via INTRAVENOUS

## 2022-01-11 MED ORDER — LIDOCAINE 2% (20 MG/ML) 5 ML SYRINGE
INTRAMUSCULAR | Status: DC | PRN
  Administered 2022-01-11: 100 mg via INTRAVENOUS

## 2022-01-11 MED ORDER — INSULIN ASPART 100 UNIT/ML IJ SOLN
0.0000 [IU] | Freq: Every day | INTRAMUSCULAR | Status: DC
  Administered 2022-01-11: 2 [IU] via SUBCUTANEOUS

## 2022-01-11 MED ORDER — SENNA 8.6 MG PO TABS
1.0000 | ORAL_TABLET | Freq: Two times a day (BID) | ORAL | Status: DC
  Administered 2022-01-11 – 2022-01-13 (×4): 8.6 mg via ORAL
  Filled 2022-01-11 (×4): qty 1

## 2022-01-11 MED ORDER — LIDOCAINE HCL (PF) 2 % IJ SOLN
INTRAMUSCULAR | Status: AC
  Filled 2022-01-11: qty 5

## 2022-01-11 MED ORDER — LACTATED RINGERS IR SOLN
Status: DC | PRN
  Administered 2022-01-11: 1000 mL

## 2022-01-11 MED ORDER — DEXMEDETOMIDINE HCL IN NACL 80 MCG/20ML IV SOLN
INTRAVENOUS | Status: DC | PRN
  Administered 2022-01-11: 12 ug via BUCCAL

## 2022-01-11 MED ORDER — SUGAMMADEX SODIUM 200 MG/2ML IV SOLN
INTRAVENOUS | Status: DC | PRN
  Administered 2022-01-11: 200 mg via INTRAVENOUS

## 2022-01-11 MED ORDER — OXYCODONE HCL 5 MG PO TABS: 5.0000 mg | ORAL_TABLET | Freq: Once | ORAL | Status: DC | PRN

## 2022-01-11 MED ORDER — DEXAMETHASONE SODIUM PHOSPHATE 10 MG/ML IJ SOLN
INTRAMUSCULAR | Status: AC
  Filled 2022-01-11: qty 1

## 2022-01-11 MED ORDER — BUPIVACAINE-EPINEPHRINE (PF) 0.25% -1:200000 IJ SOLN
INTRAMUSCULAR | Status: AC
  Filled 2022-01-11: qty 30

## 2022-01-11 MED ORDER — PROPOFOL 10 MG/ML IV BOLUS
INTRAVENOUS | Status: AC
  Filled 2022-01-11: qty 20

## 2022-01-11 MED ORDER — ESMOLOL HCL 100 MG/10ML IV SOLN
INTRAVENOUS | Status: AC
  Filled 2022-01-11: qty 10

## 2022-01-11 MED ORDER — HYDROMORPHONE HCL 1 MG/ML IJ SOLN
0.5000 mg | INTRAMUSCULAR | Status: DC | PRN
  Administered 2022-01-11: 0.5 mg via INTRAVENOUS
  Filled 2022-01-11: qty 0.5

## 2022-01-11 MED ORDER — MIDAZOLAM HCL 2 MG/2ML IJ SOLN
INTRAMUSCULAR | Status: AC
  Filled 2022-01-11: qty 2

## 2022-01-11 MED ORDER — SCOPOLAMINE 1 MG/3DAYS TD PT72
1.0000 | MEDICATED_PATCH | TRANSDERMAL | Status: DC
  Administered 2022-01-11: 1.5 mg via TRANSDERMAL

## 2022-01-11 MED ORDER — GABAPENTIN 300 MG PO CAPS
300.0000 mg | ORAL_CAPSULE | Freq: Three times a day (TID) | ORAL | Status: DC
  Administered 2022-01-11 – 2022-01-13 (×7): 300 mg via ORAL
  Filled 2022-01-11 (×7): qty 1

## 2022-01-11 MED ORDER — DOCUSATE SODIUM 100 MG PO CAPS
100.0000 mg | ORAL_CAPSULE | Freq: Two times a day (BID) | ORAL | Status: DC
  Administered 2022-01-11 – 2022-01-13 (×4): 100 mg via ORAL
  Filled 2022-01-11 (×5): qty 1

## 2022-01-11 SURGICAL SUPPLY — 50 items
ADH SKN CLS APL DERMABOND .7 (GAUZE/BANDAGES/DRESSINGS) ×1
APL PRP STRL LF DISP 70% ISPRP (MISCELLANEOUS) ×1
APL SKNCLS STERI-STRIP NONHPOA (GAUZE/BANDAGES/DRESSINGS)
APPLIER CLIP ROT 10 11.4 M/L (STAPLE) ×1
APR CLP MED LRG 11.4X10 (STAPLE) ×1
BAG COUNTER SPONGE SURGICOUNT (BAG) ×2 IMPLANT
BAG SPEC RTRVL 10 TROC 200 (ENDOMECHANICALS) ×1
BAG SPEC RTRVL LRG 6X4 10 (ENDOMECHANICALS) ×1
BAG SPNG CNTER NS LX DISP (BAG) ×1
BENZOIN TINCTURE PRP APPL 2/3 (GAUZE/BANDAGES/DRESSINGS) IMPLANT
BNDG ADH 1X3 SHEER STRL LF (GAUZE/BANDAGES/DRESSINGS) IMPLANT
BNDG ADH THN 3X1 STRL LF (GAUZE/BANDAGES/DRESSINGS)
CABLE HIGH FREQUENCY MONO STRZ (ELECTRODE) ×2 IMPLANT
CHLORAPREP W/TINT 26 (MISCELLANEOUS) ×2 IMPLANT
CLIP APPLIE ROT 10 11.4 M/L (STAPLE) ×2 IMPLANT
COVER MAYO STAND XLG (MISCELLANEOUS) IMPLANT
COVER SURGICAL LIGHT HANDLE (MISCELLANEOUS) ×2 IMPLANT
DERMABOND ADVANCED .7 DNX12 (GAUZE/BANDAGES/DRESSINGS) IMPLANT
DRAPE C-ARM 42X120 X-RAY (DRAPES) IMPLANT
ELECT REM PT RETURN 15FT ADLT (MISCELLANEOUS) ×2 IMPLANT
GLOVE BIO SURGEON STRL SZ 6 (GLOVE) ×2 IMPLANT
GLOVE INDICATOR 6.5 STRL GRN (GLOVE) ×2 IMPLANT
GLOVE SS BIOGEL STRL SZ 6 (GLOVE) ×2 IMPLANT
GOWN STRL REUS W/ TWL LRG LVL3 (GOWN DISPOSABLE) ×2 IMPLANT
GOWN STRL REUS W/ TWL XL LVL3 (GOWN DISPOSABLE) IMPLANT
GOWN STRL REUS W/TWL LRG LVL3 (GOWN DISPOSABLE) ×1
GOWN STRL REUS W/TWL XL LVL3 (GOWN DISPOSABLE)
GRASPER SUT TROCAR 14GX15 (MISCELLANEOUS) IMPLANT
HEMOSTAT SNOW SURGICEL 2X4 (HEMOSTASIS) IMPLANT
IRRIG SUCT STRYKERFLOW 2 WTIP (MISCELLANEOUS) ×1
IRRIGATION SUCT STRKRFLW 2 WTP (MISCELLANEOUS) ×2 IMPLANT
KIT BASIN OR (CUSTOM PROCEDURE TRAY) ×2 IMPLANT
KIT TURNOVER KIT A (KITS) IMPLANT
NDL INSUFFLATION 14GA 120MM (NEEDLE) ×2 IMPLANT
NEEDLE INSUFFLATION 14GA 120MM (NEEDLE) ×1 IMPLANT
POUCH RETRIEVAL ECOSAC 10 (ENDOMECHANICALS) IMPLANT
POUCH RETRIEVAL ECOSAC 10MM (ENDOMECHANICALS) ×1
POUCH SPECIMEN RETRIEVAL 10MM (ENDOMECHANICALS) ×2 IMPLANT
SCISSORS LAP 5X35 DISP (ENDOMECHANICALS) ×2 IMPLANT
SET CHOLANGIOGRAPH MIX (MISCELLANEOUS) IMPLANT
SET TUBE SMOKE EVAC HIGH FLOW (TUBING) ×2 IMPLANT
SLEEVE Z-THREAD 5X100MM (TROCAR) ×4 IMPLANT
SPIKE FLUID TRANSFER (MISCELLANEOUS) ×2 IMPLANT
STRIP CLOSURE SKIN 1/2X4 (GAUZE/BANDAGES/DRESSINGS) IMPLANT
SUT MNCRL AB 4-0 PS2 18 (SUTURE) ×2 IMPLANT
TOWEL OR 17X26 10 PK STRL BLUE (TOWEL DISPOSABLE) ×2 IMPLANT
TOWEL OR NON WOVEN STRL DISP B (DISPOSABLE) IMPLANT
TRAY LAPAROSCOPIC (CUSTOM PROCEDURE TRAY) ×2 IMPLANT
TROCAR ADV FIXATION 12X100MM (TROCAR) ×2 IMPLANT
TROCAR Z-THREAD OPTICAL 5X100M (TROCAR) ×2 IMPLANT

## 2022-01-11 NOTE — ED Triage Notes (Signed)
Pt c/o RUQ pain. Pt dx with gallstones. Pt states she last took oxycodone at 2100 without relief

## 2022-01-11 NOTE — Op Note (Signed)
Operative Note  Grace Montgomery 31 y.o. female [id number]  01/11/2022  Surgeon: Clovis Riley MD FACS  Assistant: Greer Pickerel MD FACS  Procedure performed: Laparoscopic Cholecystectomy with near infrared fluorescent cholangiography  Procedure classification: emergent  Preop diagnosis: acute cholecystitis Post-op diagnosis/intraop findings: same  Specimens: gallbladder  Retained items: none  EBL: minimal  Complications: none  Description of procedure: After obtaining informed consent the patient was brought to the operating room. Antibiotics were administered. SCD's were applied. General endotracheal anesthesia was initiated and a formal time-out was performed. The abdomen was prepped and draped in the usual sterile fashion and the abdomen was entered using an infraumbilical veress needle after instilling the site with local. Insufflation to 73mHg was obtained, 563mtrocar and camera inserted, and gross inspection revealed no evidence of injury from our entry or other intraabdominal abnormalities.  There are omental adhesions to the lower midline from her prior C-section but no other abnormalities.  Two 15m69mrocars were introduced in the right midclavicular and right anterior axillary lines under direct visualization and following infiltration with local.  The midclavicular trocar did skive the liver capsule and there was a minimal amount of bleeding from this which was hemostatic by the end of the case.  A 93m115mocar was placed in the epigastrium.  The gallbladder was extremely edematous, pale, and tensely distended.  This was decompressed with the Nezhat in order to allow to be grasped, evacuating dark brown malodorous fluid.  The gallbladder fundus was retracted cephalad and the infundibulum was retracted laterally. A combination of hook electrocautery and blunt dissection was utilized to clear the peritoneum from the neck and cystic duct, circumferentially isolating the cystic artery  and cystic duct and lifting the gallbladder from the cystic plate.  There was significant amount of chronic inflammation tethering the infundibulum to the liver bed.  This was carefully dissected taking only translucent, thin strands of tissue until we were able to elevate the gallbladder from the cystic plate.  The critical view of safety was achieved with the cystic artery, cystic duct, and liver bed visualized between them with no other structures.  Near infrared fluorescent cholangiography was performed and the anatomy was was concordant with visualization of both the common hepatic duct and the cystic duct traveling to the gallbladder.  The artery was clipped with 2 clips proximally and divided distally with cautery.  The cystic duct was ligated with three clips on the proximal end and 1 on the specimen side and then was divided sharply.. The gallbladder was dissected from the liver plate using electrocautery. Once freed the gallbladder was placed in an ecosac bag and removed through the epigastric trocar site, which did have to be extended slightly due to the large stones within the gallbladder. A small amount of bleeding on the liver bed was controlled with cautery. Some bile had been spilled from the gallbladder during its dissection from the liver bed. This was aspirated and the right upper quadrant was irrigated copiously until the effluent was clear. Hemostasis was once again confirmed, and reinspection of the abdomen revealed no injuries. The clips were well opposed without any bile leak from the duct or the liver bed. The 93mm9mcar site in the epigastrium was closed with 2 simple interrupted 0 vicryls in the fascia under direct visualization using a PMI device. The abdomen was desufflated and all trocars removed. The skin incisions were closed with subcuticular 4-0 monocryl and Dermabond. The patient was awakened, extubated and transported to the  recovery room in stable condition.    All counts were  correct at the completion of the case.
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
## 2022-01-11 NOTE — Discharge Instructions (Signed)
[facility]  Please arrive at least 30 min before your appointment to complete your check in paperwork.  If you are unable to arrive 30 min prior to your appointment time we may have to cancel or reschedule you. LAPAROSCOPIC SURGERY: POST OP INSTRUCTIONS Always review your discharge instruction sheet given to you by the facility where your surgery was performed. IF YOU HAVE DISABILITY OR FAMILY LEAVE FORMS, YOU MUST BRING THEM TO THE OFFICE FOR PROCESSING.   DO NOT GIVE THEM TO YOUR DOCTOR.  PAIN CONTROL  First take acetaminophen (Tylenol) AND/or ibuprofen (Advil) to control your pain after surgery.  Follow directions on package.  Taking acetaminophen (Tylenol) and/or ibuprofen (Advil) regularly after surgery will help to control your pain and lower the amount of prescription pain medication you may need.  You should not take more than 4,000 mg (4 grams) of acetaminophen (Tylenol) in 24 hours.  You should not take ibuprofen (Advil), aleve, motrin, naprosyn or other NSAIDS if you have a history of stomach ulcers or chronic kidney disease.  A prescription for pain medication may be given to you upon discharge.  Take your pain medication as prescribed, if you still have uncontrolled pain after taking acetaminophen (Tylenol) or ibuprofen (Advil). Use ice packs to help control pain. If you need a refill on your pain medication, please contact your pharmacy.  They will contact our office to request authorization. Prescriptions will not be filled after 5pm or on week-ends.  HOME MEDICATIONS Take your usually prescribed medications unless otherwise directed.  DIET You should follow a light diet the first few days after arrival home.  Be sure to include lots of fluids daily. Avoid fatty, fried foods.   CONSTIPATION It is common to experience some constipation after surgery and if you are taking pain medication.  Increasing fluid intake and taking a stool softener (such as Colace)  will usually help or prevent this problem from occurring.  A mild laxative (Milk of Magnesia or Miralax) should be taken according to package instructions if there are no bowel movements after 48 hours.  WOUND/INCISION CARE Most patients will experience some swelling and bruising in the area of the incisions.  Ice packs will help.  Swelling and bruising can take several days to resolve.  Unless discharge instructions indicate otherwise, follow guidelines below  STERI-STRIPS - you may remove your outer bandages 48 hours after surgery, and you may shower at that time.  You have steri-strips (small skin tapes) in place directly over the incision.  These strips should be left on the skin for 7-10 days.   DERMABOND/SKIN GLUE - you may shower in 24 hours.  The glue will flake off over the next 2-3 weeks. Any sutures or staples will be removed at the office during your follow-up visit.  ACTIVITIES You may resume regular (light) daily activities beginning the next day--such as daily self-care, walking, climbing stairs--gradually increasing activities as tolerated.  You may have sexual intercourse when it is comfortable.  Refrain from any heavy lifting or straining until approved by your doctor. You may drive when you are no longer taking prescription pain medication, you can comfortably wear a seatbelt, and you can safely maneuver your car and apply brakes.  FOLLOW-UP You should see your doctor in the office for a follow-up appointment approximately 2-3 weeks after your surgery.  You should have been given your post-op/follow-up appointment when your surgery was scheduled.  If you did not receive a post-op/follow-up appointment, make sure   that you call for this appointment within a day or two after you arrive home to insure a convenient appointment time.   WHEN TO CALL YOUR DOCTOR: Fever over 101.0 Inability to urinate Continued bleeding from incision. Increased pain, redness, or drainage from the  incision. Increasing abdominal pain  The clinic staff is available to answer your questions during regular business hours.  Please don't hesitate to call and ask to speak to one of the nurses for clinical concerns.  If you have a medical emergency, go to the nearest emergency room or call 911.  A surgeon from [facility] is always on call at the hospital. [address], [address], [place], [place]  [address] ? [address] [address], [place], [place]   [address] [phone] ? [phone] ? FAX [phone]  

## 2022-01-11 NOTE — Anesthesia Procedure Notes (Signed)
Procedure Name: Intubation Date/Time: 01/11/2022 9:39 AM  Performed by: Lind Covert, CRNAPre-anesthesia Checklist: Patient identified, Emergency Drugs available, Suction available, Patient being monitored and Timeout performed Patient Re-evaluated:Patient Re-evaluated prior to induction Oxygen Delivery Method: Circle system utilized Preoxygenation: Pre-oxygenation with 100% oxygen Induction Type: IV induction and Cricoid Pressure applied Ventilation: Mask ventilation without difficulty Laryngoscope Size: Mac and 4 Grade View: Grade I Tube type: Oral Number of attempts: 1 Airway Equipment and Method: Stylet Placement Confirmation: ETT inserted through vocal cords under direct vision, positive ETCO2 and breath sounds checked- equal and bilateral Secured at: 22 cm Tube secured with: Tape Dental Injury: Teeth and Oropharynx as per pre-operative assessment

## 2022-01-11 NOTE — Plan of Care (Signed)
Plan of care initiated and discussed. 

## 2022-01-11 NOTE — Transfer of Care (Signed)
Immediate Anesthesia Transfer of Care Note  Patient: Grace Montgomery  Procedure(s) Performed: LAPAROSCOPIC CHOLECYSTECTOMY  Patient Location: PACU  Anesthesia Type:General  Level of Consciousness: sedated  Airway & Oxygen Therapy: Patient Spontanous Breathing and Patient connected to face mask oxygen  Post-op Assessment: Report given to RN and Post -op Vital signs reviewed and stable  Post vital signs: Reviewed and stable  Last Vitals:  Vitals Value Taken Time  BP    Temp    Pulse 124 01/11/22 1124  Resp 16 01/11/22 1124  SpO2 100 % 01/11/22 1124  Vitals shown include unvalidated device data.  Last Pain:  Vitals:   01/11/22 0854  TempSrc:   PainSc: 0-No pain      Patients Stated Pain Goal: 2 (27/63/94 3200)  Complications: No notable events documented.

## 2022-01-11 NOTE — Progress Notes (Signed)
  Transition of Care [facility]) Screening Note   Patient Details  Name: Grace Montgomery Date of Birth: [birth year]   Transition of Care [facility]) CM/SW Contact:    Lennart Pall, LCSW Phone Number: 01/11/2022, 3:33 PM    Transition of Care Department [facility]) has reviewed patient and no TOC needs have been identified at this time. We will continue to monitor patient advancement through interdisciplinary progression rounds. If new patient transition needs arise, please place a TOC consult.

## 2022-01-11 NOTE — Interval H&P Note (Signed)
History and Physical Interval Note:  01/11/2022 8:53 AM  Grace Montgomery  has presented today for surgery, with the diagnosis of CHOLECYSTITIS.  The various methods of treatment have been discussed with the patient and family. Discussed risks of surgery including bleeding, pain, scarring, intraabdominal injury specifically to the common bile duct and sequelae, bile leak, conversion to open surgery, subtotal cholecystectomy, blood clot, pneumonia, heart attack, stroke, failure to resolve symptoms, etc. Questions welcomed and answered.  After consideration of risks, benefits and other options for treatment, the patient has consented to  Procedure(s): LAPAROSCOPIC CHOLECYSTECTOMY WITH POSSIBLE INTRAOPERATIVE CHOLANGIOGRAM (N/A) as a surgical intervention.  The patient's history has been reviewed, patient examined, no change in status, stable for surgery.  I have reviewed the patient's chart and labs.  Questions were answered to the patient's satisfaction.     Gregoire Bennis Rich Brave

## 2022-01-11 NOTE — ED Provider Notes (Signed)
[facility] DEPT Provider Note   CSN: [id number] Arrival date & time: 01/11/22  0001     History  Chief Complaint  Patient presents with   Abdominal Pain    Grace Montgomery is a 31 y.o. female.  31 year old female with past medical history of diabetes presents with complaint of right upper quadrant abdominal pain onset on Friday.  Patient came to the emergency room that day, diagnosed with gallstones and referred to general surgery for follow-up.  States that her pain was never really controlled, she continued to take Percocet throughout the weekend as well as the Zofran however continues to have pain and vomiting.  Denies fevers, chills.  Last bowel movement unknown, is passing gas. Tried to eat chicken noodle soup today however was unable to keep this down.        Home Medications Prior to Admission medications   Medication Sig Start Date End Date Taking? Authorizing Provider  JARDIANCE 25 MG TABS tablet TAKE 1 TABLET(25 MG) BY MOUTH DAILY Patient taking differently: Take 25 mg by mouth daily. 11/27/21  Yes McDiarmid, Blane Ohara, MD  metFORMIN (GLUCOPHAGE) 1000 MG tablet Take 1 tablet (1,000 mg total) by mouth 2 (two) times daily with a meal. 07/30/21 07/25/22 Yes McDiarmid, Blane Ohara, MD  Multiple Vitamin (MULTIVITAMIN WITH MINERALS) TABS tablet Take 1 tablet by mouth daily. 11/11/20  Yes Ethelene Hal, NP  ondansetron (ZOFRAN) 4 MG tablet Take 1 tablet (4 mg total) by mouth every 6 (six) hours. 01/09/22  Yes Rex Kras, PA  oxyCODONE-acetaminophen (PERCOCET/ROXICET) 5-325 MG tablet Take 1 tablet by mouth every 6 (six) hours as needed for severe pain. 01/09/22  Yes Rex Kras, PA  ketoconazole (NIZORAL) 2 % shampoo Apply 1 application. topically 2 (two) times a week. Patient not taking: Reported on 01/11/2022 07/30/21   McDiarmid, Blane Ohara, MD      Allergies    Monistat [tioconazole], Chocolate, Ibuprofen, and Banana    Review of Systems   Review of  Systems Negative except as per HPI Physical Exam Updated Vital Signs BP 129/81   Pulse 79   Temp 98.6 F (37 C) (Oral)   Resp 18   SpO2 96%  Physical Exam Vitals and nursing note reviewed.  Constitutional:      General: She is not in acute distress.    Appearance: She is well-developed. She is not diaphoretic.  HENT:     Head: Normocephalic and atraumatic.  Cardiovascular:     Rate and Rhythm: Normal rate and regular rhythm.     Heart sounds: Normal heart sounds.  Pulmonary:     Effort: Pulmonary effort is normal.     Breath sounds: Normal breath sounds.  Abdominal:     Palpations: Abdomen is soft.     Tenderness: There is abdominal tenderness in the right upper quadrant. There is guarding. There is no right CVA tenderness or left CVA tenderness.  Skin:    General: Skin is warm and dry.     Findings: No erythema or rash.  Neurological:     Mental Status: She is alert and oriented to person, place, and time.  Psychiatric:        Behavior: Behavior normal.     ED Results / Procedures / Treatments   Labs (all labs ordered are listed, but only abnormal results are displayed) Labs Reviewed  CBC WITH DIFFERENTIAL/PLATELET - Abnormal; Notable for the following components:      Result Value   WBC 13.1 (*)  RBC 3.61 (*)    Hemoglobin 9.3 (*)    HCT 29.8 (*)    MCH 25.8 (*)    RDW 17.5 (*)    Platelets 473 (*)    Neutro Abs 11.0 (*)    All other components within normal limits  COMPREHENSIVE METABOLIC PANEL - Abnormal; Notable for the following components:   Sodium 134 (*)    Glucose, Bld 172 (*)    BUN <5 (*)    Calcium 8.7 (*)    Albumin 3.1 (*)    AST 138 (*)    ALT 127 (*)    Alkaline Phosphatase 131 (*)    Total Bilirubin 1.7 (*)    All other components within normal limits  LIPASE, BLOOD  URINALYSIS, ROUTINE W REFLEX MICROSCOPIC    EKG None  Radiology US Abdomen Limited RUQ (LIVER/GB)  Result Date: 01/09/2022 CLINICAL DATA:  Right upper quadrant  pain after eating EXAM: ULTRASOUND ABDOMEN LIMITED RIGHT UPPER QUADRANT COMPARISON:  None Available. FINDINGS: Gallbladder: Shadowing gallstone at the neck with sludge filling the gallbladder. No gallbladder wall thickening or pericholecystic edema. Common bile duct: Diameter: 3 mm Liver: No focal lesion identified. Within normal limits in parenchymal echogenicity. Portal vein is patent on color Doppler imaging with normal direction of blood flow towards the liver. IMPRESSION: Cholelithiasis and extensive gallbladder sludge. No indication of acute cholecystitis. Electronically Signed   By: Jorje Guild M.D.   On: 01/09/2022 05:05    Procedures Procedures    Medications Ordered in ED Medications  HYDROmorphone (DILAUDID) injection 1 mg (has no administration in time range)  cefTRIAXone (ROCEPHIN) 2 g in sodium chloride 0.9 % 100 mL IVPB (has no administration in time range)  sodium chloride 0.9 % bolus 1,000 mL (1,000 mLs Intravenous New Bag/Given 01/11/22 0216)  ondansetron (ZOFRAN) injection 4 mg (4 mg Intravenous Given 01/11/22 0215)  HYDROmorphone (DILAUDID) injection 1 mg (1 mg Intravenous Given 01/11/22 0215)    ED Course/ Medical Decision Making/ A&P                           Medical Decision Making Amount and/or Complexity of Data Reviewed Labs: ordered.  Risk Prescription drug management. Decision regarding hospitalization.   This patient presents to the ED for concern of right upper quadrant abdominal pain with nausea and vomiting, this involves an extensive number of treatment options, and is a complaint that carries with it a high risk of complications and morbidity.  The differential diagnosis includes acute cholecystitis, cholelithiasis, peptic ulcers, CBD stone, pancreatitis    Co morbidities that complicate the patient evaluation  Diabetes    Additional history obtained:  External records from outside source obtained and reviewed including ultrasound report and  labs obtained through recent ER visit dated 01/09/2022.  Ultrasound with gallstones and sludge, no gallbladder wall thickening, normal CBD.  Labs with normal LFTs, WBC 13,000.   Lab Tests:  I Ordered, and personally interpreted labs.  The pertinent results include: CBC with unchanged WBC at 13.1.  Lipase normal.  CMP with glucose of 172, history of diabetes.  AST and ALT elevated at 138 and 127.  Alk phos elevated 131, total bilirubin 1.7.  Consultations Obtained:  I requested consultation with the general surgery, Dr. Marcello Moores,  and discussed lab and imaging findings as well as pertinent plan - they recommend: Will admit   Problem List / ED Course / Critical interventions / Medication management  31 year old female with complaint  of right upper quadrant pain with nausea and vomiting.  Onset of symptoms on Friday, came to ER for same, diagnosed with gallstones based on ultrasound with normal chemistry, white count elevated at 13,000.  Returns with ongoing pain, states pain never really controlled, continues to vomit.  She is afebrile.  White count is unchanged.  Now with elevated LFTs and bilirubin.  Pain improved after second dose of Dilaudid.  Provided with IV fluids.  Case discussed with Dr. Marcello Moores with general surgery who will admit. I ordered medication including Dilaudid, Zofran, Rocephin for pain, acute cholecystitis Reevaluation of the patient after these medicines showed that the patient improved I have reviewed the patients home medicines and have made adjustments as needed   Social Determinants of Health:  Has PCP care   Test / Admission - Considered:  Consider CT scan however ultrasound completed 2 days ago with gallstones, history and exam, labs consistent with acute cholecystitis         Final Clinical Impression(s) / ED Diagnoses Final diagnoses:  Acute cholecystitis    Rx / DC Orders ED Discharge Orders     None         Tacy Learn, PA-C 19/62/22  [id number]    Delora Fuel, MD 92/11/94 [phone]

## 2022-01-11 NOTE — Anesthesia Postprocedure Evaluation (Signed)
Anesthesia Post Note  Patient: Grace Montgomery  Procedure(s) Performed: LAPAROSCOPIC CHOLECYSTECTOMY     Patient location during evaluation: PACU Anesthesia Type: General Level of consciousness: awake and alert and oriented Pain management: pain level controlled Vital Signs Assessment: post-procedure vital signs reviewed and stable Respiratory status: spontaneous breathing, nonlabored ventilation and respiratory function stable Cardiovascular status: blood pressure returned to baseline and stable Postop Assessment: no apparent nausea or vomiting Anesthetic complications: no   No notable events documented.  Last Vitals:  Vitals:   01/11/22 1215 01/11/22 1234  BP: 125/86 121/89  Pulse:  92  Resp:  18  Temp:  37.2 C  SpO2:  100%    Last Pain:  Vitals:   01/11/22 1234  TempSrc: Oral  PainSc: 7                  Zariana Strub A.

## 2022-01-11 NOTE — Anesthesia Preprocedure Evaluation (Addendum)
Anesthesia Evaluation  Patient identified by MRN, date of birth, ID band Patient awake    Reviewed: Allergy & Precautions, NPO status , Patient's Chart, lab work & pertinent test results  Airway Mallampati: II  TM Distance: >3 FB Neck ROM: Full    Dental no notable dental hx. (+) Dental Advisory Given   Pulmonary asthma , Current Smoker and Patient abstained from smoking.   Pulmonary exam normal breath sounds clear to auscultation       Cardiovascular negative cardio ROS Normal cardiovascular exam Rhythm:Regular Rate:Normal     Neuro/Psych  Headaches PSYCHIATRIC DISORDERS Anxiety Depression Bipolar Disorder    Neuromuscular disease    GI/Hepatic Elevated LFT's Cholelithiasis   Endo/Other  diabetes, Well Controlled, Type 2, Oral Hypoglycemic Agents  Obesity  Renal/GU negative Renal ROS  negative genitourinary   Musculoskeletal negative musculoskeletal ROS (+)    Abdominal  (+) + obese Abdomen: tender.   Peds  Hematology   Anesthesia Other Findings   Reproductive/Obstetrics                              Anesthesia Physical Anesthesia Plan  ASA: 2  Anesthesia Plan: General   Post-op Pain Management: Precedex and Ofirmev IV (intra-op)*   Induction: Intravenous, Rapid sequence and Cricoid pressure planned  PONV Risk Score and Plan: 4 or greater and Treatment may vary due to age or medical condition, Ondansetron, Scopolamine patch - Pre-op and Midazolam  Airway Management Planned: Oral ETT  Additional Equipment: None  Intra-op Plan:   Post-operative Plan: Extubation in OR  Informed Consent: I have reviewed the patients History and Physical, chart, labs and discussed the procedure including the risks, benefits and alternatives for the proposed anesthesia with the patient or authorized representative who has indicated his/her understanding and acceptance.     Dental advisory  given  Plan Discussed with: CRNA and Anesthesiologist  Anesthesia Plan Comments:        Anesthesia Quick Evaluation

## 2022-01-12 ENCOUNTER — Encounter (HOSPITAL_COMMUNITY): Payer: Self-pay | Admitting: Surgery

## 2022-01-12 LAB — SURGICAL PATHOLOGY

## 2022-01-12 LAB — GLUCOSE, CAPILLARY
Glucose-Capillary: 143 mg/dL — ABNORMAL HIGH (ref 70–99)
Glucose-Capillary: 219 mg/dL — ABNORMAL HIGH (ref 70–99)
Glucose-Capillary: 179 mg/dL — ABNORMAL HIGH (ref 70–99)
Glucose-Capillary: 260 mg/dL — ABNORMAL HIGH (ref 70–99)
Glucose-Capillary: 188 mg/dL — ABNORMAL HIGH (ref 70–99)

## 2022-01-12 MED ORDER — METHOCARBAMOL 500 MG PO TABS
500.0000 mg | ORAL_TABLET | Freq: Four times a day (QID) | ORAL | Status: DC | PRN
  Administered 2022-01-12 – 2022-01-13 (×3): 500 mg via ORAL
  Filled 2022-01-12 (×3): qty 1

## 2022-01-12 MED ORDER — NAPROXEN 500 MG PO TABS: 500.0000 mg | ORAL_TABLET | Freq: Two times a day (BID) | ORAL | Status: DC | PRN

## 2022-01-12 MED ORDER — OXYCODONE HCL 5 MG PO TABS: 5.0000 mg | ORAL_TABLET | Freq: Four times a day (QID) | ORAL | 0 refills | Status: DC | PRN

## 2022-01-12 MED ORDER — NAPROXEN 250 MG PO TABS
500.0000 mg | ORAL_TABLET | Freq: Two times a day (BID) | ORAL | Status: DC
  Administered 2022-01-12 – 2022-01-13 (×3): 500 mg via ORAL
  Filled 2022-01-12 (×4): qty 2

## 2022-01-12 MED ORDER — POLYETHYLENE GLYCOL 3350 17 G PO PACK: 17.0000 g | PACK | Freq: Every day | ORAL | 0 refills | Status: DC | PRN

## 2022-01-12 MED ORDER — OXYCODONE HCL 5 MG PO TABS
5.0000 mg | ORAL_TABLET | Freq: Four times a day (QID) | ORAL | Status: DC | PRN
  Administered 2022-01-12 (×2): 10 mg via ORAL
  Administered 2022-01-13: 5 mg via ORAL
  Administered 2022-01-13: 10 mg via ORAL
  Filled 2022-01-12: qty 2
  Filled 2022-01-12: qty 1
  Filled 2022-01-12 (×2): qty 2

## 2022-01-12 MED ORDER — PANTOPRAZOLE SODIUM 40 MG PO TBEC
40.0000 mg | DELAYED_RELEASE_TABLET | Freq: Every day | ORAL | Status: DC
  Administered 2022-01-13: 40 mg via ORAL
  Filled 2022-01-12 (×2): qty 1

## 2022-01-12 MED ORDER — ACETAMINOPHEN 500 MG PO TABS: 1000.0000 mg | ORAL_TABLET | Freq: Four times a day (QID) | ORAL | 0 refills | Status: DC | PRN

## 2022-01-12 NOTE — Progress Notes (Signed)
[facility] Progress Note  1 Day Post-Op  Subjective: CC-  Abdomen is very sore. Pain is different than prior to surgery. Denies n/v. Tolerating diet. She has been able to ambulate. States that she didn't sleep well because he abdomen hurt trying to roll over.  Objective: Vital signs in last 24 hours: Temp:  [98 F (36.7 C)-99.4 F (37.4 C)] 99 F (37.2 C) (11/07 0455) Pulse Rate:  [78-124] 93 (11/07 0455) Resp:  [12-23] 17 (11/07 0455) BP: (117-161)/(71-95) 126/82 (11/07 0455) SpO2:  [99 %-100 %] 100 % (11/07 0455) Weight:  [84.3 kg] 84.3 kg (11/06 0854) Last BM Date : 01/08/22  Intake/Output from previous day: 11/06 0701 - 11/07 0700 In: 1440 [P.O.:240; I.V.:1200] Out: 25 [Blood:25] Intake/Output this shift: No intake/output data recorded.  PE: Gen:  Alert, NAD Abd: soft, minimal distension, appropriately tender over incisions and RUQ, no peritonitis   Lab Results:  Recent Labs    01/11/22 0143  WBC 13.1*  HGB 9.3*  HCT 29.8*  PLT 473*   BMET Recent Labs    01/11/22 0143  NA 134*  K 4.1  CL 103  CO2 24  GLUCOSE 172*  BUN <5*  CREATININE 0.70  CALCIUM 8.7*   PT/INR No results for input(s): "LABPROT", "INR" in the last 72 hours. CMP     Component Value Date/Time   NA 134 (L) 01/11/2022 0143   NA 139 04/17/2020 1644   K 4.1 01/11/2022 0143   CL 103 01/11/2022 0143   CO2 24 01/11/2022 0143   GLUCOSE 172 (H) 01/11/2022 0143   BUN <5 (L) 01/11/2022 0143   BUN 10 04/17/2020 1644   CREATININE 0.70 01/11/2022 0143   CALCIUM 8.7 (L) 01/11/2022 0143   PROT 7.7 01/11/2022 0143   PROT 6.3 01/12/2018 1343   ALBUMIN 3.1 (L) 01/11/2022 0143   ALBUMIN 3.6 01/12/2018 1343   AST 138 (H) 01/11/2022 0143   ALT 127 (H) 01/11/2022 0143   ALKPHOS 131 (H) 01/11/2022 0143   BILITOT 1.7 (H) 01/11/2022 0143   BILITOT <0.2 01/12/2018 1343   GFRNONAA >60 01/11/2022 0143   GFRAA 106 04/17/2020 1644   Lipase     Component Value Date/Time   LIPASE 27  01/11/2022 0143       Studies/Results: No results found.  Anti-infectives: Anti-infectives (From admission, onward)    Start     Dose/Rate Route Frequency Ordered Stop   01/12/22 0400  cefTRIAXone (ROCEPHIN) 2 g in sodium chloride 0.9 % 100 mL IVPB        2 g 200 mL/hr over 30 Minutes Intravenous Every 24 hours 01/11/22 0451 01/19/22 0359   01/11/22 0300  cefTRIAXone (ROCEPHIN) 2 g in sodium chloride 0.9 % 100 mL IVPB        2 g 200 mL/hr over 30 Minutes Intravenous  Once 01/11/22 0248 01/11/22 0419        Assessment/Plan  Acute cholecystitis -POD#1 s/p Laparoscopic Cholecystectomy with near infrared fluorescent cholangiography  11/6 Dr. Kae Heller - Doing well and tolerating diet but abdomen is very sore. Add scheduled naproxen to tylenol, oxy PRN. Mobilize. Likely home later today if pain is better controlled.  ID - rocephin 11/6>>11/7 FEN - CM diet VTE - lovenox Foley - none   LOS: 0 days    Wellington Hampshire, [facility] 01/12/2022, 8:52 AM Please see Amion for pager number during day hours 7:00am-4:30pm

## 2022-01-13 LAB — GLUCOSE, CAPILLARY
Glucose-Capillary: 115 mg/dL — ABNORMAL HIGH (ref 70–99)
Glucose-Capillary: 220 mg/dL — ABNORMAL HIGH (ref 70–99)

## 2022-01-13 MED ORDER — METHOCARBAMOL 500 MG PO TABS: 500.0000 mg | ORAL_TABLET | Freq: Four times a day (QID) | ORAL | 0 refills | Status: AC | PRN

## 2022-01-13 NOTE — Discharge Summary (Signed)
[facility] Discharge Summary   Patient ID: Grace Montgomery MRN: [id number] DOB/AGE: December 16, 1990 31 y.o.  Admit date: 01/11/2022 Discharge date: 01/13/2022  Admitting Diagnosis: Acute cholecystitis [K81.0] Cholecystitis [K81.9]   Discharge Diagnosis Acute cholecystitis [K81.0] Cholecystitis [K81.9] S/p laparoscopic cholecystectomy  Consultants None  Imaging: No results found.  Procedures Dr. Kae Heller (01/11/22) - Laparoscopic Cholecystectomy with ICG  Hospital Course:  31 year old female who presented to [facility] ED with abdominal pain.  Workup showed cholecystitis.  Patient was admitted and underwent procedure listed above.  Tolerated procedure well and was transferred to the floor.  Diet was advanced as tolerated. On POD2, the patient was voiding well, tolerating diet, ambulating well, pain well controlled, vital signs stable, incisions c/d/i and felt stable for discharge home.  Patient will follow up in our office in 3 weeks and knows to call with questions or concerns.   I or a member of my team have reviewed this patient in the Controlled Substance Database. I was not directly involved in this patient's care therefore the information in this discharge summary was taken from the chart.  Allergies as of 01/13/2022       Reactions   Monistat [tioconazole] Swelling   Vaginal topical   Chocolate Itching, Other (See Comments)   Ears & throat itch   Ibuprofen Swelling   Swelling of the feet   Banana Itching, Other (See Comments)   Throat itching        Medication List     STOP taking these medications    ketoconazole 2 % shampoo Commonly known as: Nizoral   oxyCODONE-acetaminophen 5-325 MG tablet Commonly known as: PERCOCET/ROXICET       TAKE these medications    acetaminophen 500 MG tablet Commonly known as: TYLENOL Take 2 tablets (1,000 mg total) by mouth every 6 (six) hours as needed for mild pain.   Jardiance 25 MG Tabs tablet Generic drug:  empagliflozin TAKE 1 TABLET(25 MG) BY MOUTH DAILY What changed: See the new instructions.   metFORMIN 1000 MG tablet Commonly known as: GLUCOPHAGE Take 1 tablet (1,000 mg total) by mouth 2 (two) times daily with a meal.   methocarbamol 500 MG tablet Commonly known as: ROBAXIN Take 1 tablet (500 mg total) by mouth every 6 (six) hours as needed for up to 5 days for muscle spasms (pain).   multivitamin with minerals Tabs tablet Take 1 tablet by mouth daily.   naproxen 500 MG tablet Commonly known as: NAPROSYN Take 1 tablet (500 mg total) by mouth 2 (two) times daily as needed for moderate pain.   ondansetron 4 MG tablet Commonly known as: ZOFRAN Take 1 tablet (4 mg total) by mouth every 6 (six) hours.   oxyCODONE 5 MG immediate release tablet Commonly known as: Oxy IR/ROXICODONE Take 1 tablet (5 mg total) by mouth every 6 (six) hours as needed for severe pain.   polyethylene glycol 17 g packet Commonly known as: MiraLax Take 17 g by mouth daily as needed for mild constipation.          Follow-up Information     Maczis, Carlena Hurl, [place]. Go on 02/02/2022.   Specialty: General Surgery Why: follow up 02/02/22 at 9:45 am, Please arrive 30 minutes early to complete check in, and bring photo ID and insurance card. Contact information: [place] [address] [phone]                 Signed: Jana Half  Dannielle Burn , [facility] Surgery 01/13/2022, 2:07 PM Please see Amion for pager number during day hours 7:00am-4:30pm

## 2022-01-14 ENCOUNTER — Telehealth: Payer: Self-pay

## 2022-01-14 NOTE — Patient Outreach (Signed)
  Care Coordination TOC Note Transition Care Management Follow-up Telephone Call Date of discharge and from where: [facility] 01/11/22-01/13/22 How have you been since you were released from the hospital?  "I am still sore but doing okay" Any questions or concerns? No  Items Reviewed: Did the pt receive and understand the discharge instructions provided? Yes  Medications obtained and verified? Yes  Other? No  Any new allergies since your discharge? No  Dietary orders reviewed? Yes Do you have support at home? Yes   Home Care and Equipment/Supplies: Were home health services ordered? no If so, what is the name of the agency? N/A  Has the agency set up a time to come to the patient's home? no Were any new equipment or medical supplies ordered?  No What is the name of the medical supply agency? N/A Were you able to get the supplies/equipment? no Do you have any questions related to the use of the equipment or supplies? No  Functional Questionnaire: (I = Independent and D = Dependent) ADLs: I  Bathing/Dressing- I  Meal Prep- I  Eating- I  Maintaining continence- I  Transferring/Ambulation- I  Managing Meds- I  Follow up appointments reviewed:  PCP Hospital f/u appt confirmed? No   Specialist Hospital f/u appt confirmed? Yes  Scheduled to see [facility] on 02/02/22 @ 0945. Are transportation arrangements needed? No  If their condition worsens, is the pt aware to call PCP or go to the Emergency Dept.? Yes Was the patient provided with contact information for the PCP's office or ED? Yes Was to pt encouraged to call back with questions or concerns? Yes  SDOH assessments and interventions completed:   Yes  Care Coordination Interventions Activated:  Yes   Care Coordination Interventions:  No Care Coordination interventions needed at this time.   Encounter Outcome:  Pt. Visit Completed

## 2022-04-18 ENCOUNTER — Encounter (HOSPITAL_COMMUNITY): Payer: Self-pay | Admitting: Emergency Medicine

## 2022-04-18 ENCOUNTER — Emergency Department (HOSPITAL_COMMUNITY): Payer: Medicaid Other

## 2022-04-18 ENCOUNTER — Ambulatory Visit (HOSPITAL_COMMUNITY)
Admission: EM | Admit: 2022-04-18 | Discharge: 2022-04-19 | Disposition: A | Discharge status: Home / Self Care | Payer: Medicaid Other | Attending: Nurse Practitioner | Admitting: Nurse Practitioner

## 2022-04-18 ENCOUNTER — Emergency Department (HOSPITAL_COMMUNITY)
Admission: EM | Admit: 2022-04-18 | Discharge: 2022-04-18 | Disposition: A | Discharge status: Other Acute Inpatient Hospital | Payer: Medicaid Other | Attending: Emergency Medicine | Admitting: Emergency Medicine

## 2022-04-18 DIAGNOSIS — F332 Major depressive disorder, recurrent severe without psychotic features: Secondary | ICD-10-CM | POA: Diagnosis present

## 2022-04-18 DIAGNOSIS — F329 Major depressive disorder, single episode, unspecified: Secondary | ICD-10-CM | POA: Diagnosis present

## 2022-04-18 DIAGNOSIS — F17210 Nicotine dependence, cigarettes, uncomplicated: Secondary | ICD-10-CM | POA: Insufficient documentation

## 2022-04-18 DIAGNOSIS — Z1152 Encounter for screening for COVID-19: Secondary | ICD-10-CM | POA: Insufficient documentation

## 2022-04-18 DIAGNOSIS — R059 Cough, unspecified: Secondary | ICD-10-CM | POA: Insufficient documentation

## 2022-04-18 DIAGNOSIS — R45851 Suicidal ideations: Secondary | ICD-10-CM | POA: Diagnosis not present

## 2022-04-18 DIAGNOSIS — E119 Type 2 diabetes mellitus without complications: Secondary | ICD-10-CM | POA: Diagnosis not present

## 2022-04-18 DIAGNOSIS — F411 Generalized anxiety disorder: Secondary | ICD-10-CM | POA: Insufficient documentation

## 2022-04-18 DIAGNOSIS — F331 Major depressive disorder, recurrent, moderate: Secondary | ICD-10-CM | POA: Insufficient documentation

## 2022-04-18 DIAGNOSIS — F1290 Cannabis use, unspecified, uncomplicated: Secondary | ICD-10-CM | POA: Insufficient documentation

## 2022-04-18 DIAGNOSIS — Z5900 Homelessness unspecified: Secondary | ICD-10-CM | POA: Insufficient documentation

## 2022-04-18 DIAGNOSIS — F1490 Cocaine use, unspecified, uncomplicated: Secondary | ICD-10-CM | POA: Insufficient documentation

## 2022-04-18 DIAGNOSIS — F1010 Alcohol abuse, uncomplicated: Secondary | ICD-10-CM

## 2022-04-18 DIAGNOSIS — J45909 Unspecified asthma, uncomplicated: Secondary | ICD-10-CM | POA: Diagnosis not present

## 2022-04-18 DIAGNOSIS — F4320 Adjustment disorder, unspecified: Secondary | ICD-10-CM | POA: Insufficient documentation

## 2022-04-18 LAB — I-STAT CHEM 8, ED
BUN: 4 mg/dL — ABNORMAL LOW (ref 6–20)
Calcium, Ion: 1.1 mmol/L — ABNORMAL LOW (ref 1.15–1.40)
Chloride: 105 mmol/L (ref 98–111)
Creatinine, Ser: 0.7 mg/dL (ref 0.44–1.00)
Glucose, Bld: 95 mg/dL (ref 70–99)
HCT: 33 % — ABNORMAL LOW (ref 36.0–46.0)
Hemoglobin: 11.2 g/dL — ABNORMAL LOW (ref 12.0–15.0)
Potassium: 3.8 mmol/L (ref 3.5–5.1)
Sodium: 138 mmol/L (ref 135–145)
TCO2: 20 mmol/L — ABNORMAL LOW (ref 22–32)

## 2022-04-18 LAB — CBC
HCT: 30.8 % — ABNORMAL LOW (ref 36.0–46.0)
Hemoglobin: 9.4 g/dL — ABNORMAL LOW (ref 12.0–15.0)
MCH: 25.3 pg — ABNORMAL LOW (ref 26.0–34.0)
MCHC: 30.5 g/dL (ref 30.0–36.0)
MCV: 83 fL (ref 80.0–100.0)
Platelets: 383 10*3/uL (ref 150–400)
RBC: 3.71 MIL/uL — ABNORMAL LOW (ref 3.87–5.11)
RDW: 19.2 % — ABNORMAL HIGH (ref 11.5–15.5)
WBC: 7.7 10*3/uL (ref 4.0–10.5)
nRBC: 0 % (ref 0.0–0.2)

## 2022-04-18 LAB — ETHANOL: Alcohol, Ethyl (B): <10 mg/dL (ref <10)

## 2022-04-18 LAB — COMPREHENSIVE METABOLIC PANEL
ALT: 15 U/L (ref 0–44)
AST: 22 U/L (ref 15–41)
Albumin: 3.4 g/dL — ABNORMAL LOW (ref 3.5–5.0)
Alkaline Phosphatase: 64 U/L (ref 38–126)
Anion gap: 10 (ref 5–15)
BUN: 5 mg/dL — ABNORMAL LOW (ref 6–20)
CO2: 21 mmol/L — ABNORMAL LOW (ref 22–32)
Calcium: 8.8 mg/dL — ABNORMAL LOW (ref 8.9–10.3)
Chloride: 103 mmol/L (ref 98–111)
Creatinine, Ser: 0.78 mg/dL (ref 0.44–1.00)
GFR, Estimated: >60 mL/min (ref >60)
Glucose, Bld: 98 mg/dL (ref 70–99)
Potassium: 3.7 mmol/L (ref 3.5–5.1)
Sodium: 134 mmol/L — ABNORMAL LOW (ref 135–145)
Total Bilirubin: 0.5 mg/dL (ref 0.3–1.2)
Total Protein: 7.1 g/dL (ref 6.5–8.1)

## 2022-04-18 LAB — ACETAMINOPHEN LEVEL: Acetaminophen (Tylenol), Serum: <10 ug/mL — ABNORMAL LOW (ref 10–30)

## 2022-04-18 LAB — SALICYLATE LEVEL: Salicylate Lvl: <7 mg/dL — ABNORMAL LOW (ref 7.0–30.0)

## 2022-04-18 LAB — RAPID URINE DRUG SCREEN, HOSP PERFORMED
Amphetamines: NOT DETECTED
Barbiturates: NOT DETECTED
Benzodiazepines: NOT DETECTED
Cocaine: POSITIVE — AB
Opiates: NOT DETECTED
Tetrahydrocannabinol: POSITIVE — AB

## 2022-04-18 LAB — RESP PANEL BY RT-PCR (RSV, FLU A&B, COVID)  RVPGX2
Influenza A by PCR: NEGATIVE
Influenza B by PCR: NEGATIVE
Resp Syncytial Virus by PCR: NEGATIVE
SARS Coronavirus 2 by RT PCR: NEGATIVE

## 2022-04-18 MED ORDER — ACETAMINOPHEN 325 MG PO TABS: 650.0000 mg | ORAL_TABLET | Freq: Four times a day (QID) | ORAL | Status: DC | PRN

## 2022-04-18 MED ORDER — ADULT MULTIVITAMIN W/MINERALS CH
1.0000 | ORAL_TABLET | Freq: Every day | ORAL | Status: DC
  Administered 2022-04-18: 1 via ORAL
  Filled 2022-04-18: qty 1

## 2022-04-18 MED ORDER — ALBUTEROL SULFATE HFA 108 (90 BASE) MCG/ACT IN AERS
1.0000 | INHALATION_SPRAY | Freq: Once | RESPIRATORY_TRACT | Status: AC
  Administered 2022-04-18: 2 via RESPIRATORY_TRACT
  Filled 2022-04-18: qty 6.7

## 2022-04-18 MED ORDER — METFORMIN HCL 500 MG PO TABS
1000.0000 mg | ORAL_TABLET | Freq: Two times a day (BID) | ORAL | Status: DC
  Administered 2022-04-18: 1000 mg via ORAL
  Filled 2022-04-18: qty 2

## 2022-04-18 MED ORDER — HYDROXYZINE HCL 25 MG PO TABS
25.0000 mg | ORAL_TABLET | Freq: Three times a day (TID) | ORAL | Status: DC | PRN
  Administered 2022-04-18: 25 mg via ORAL
  Filled 2022-04-18: qty 1

## 2022-04-18 MED ORDER — ACETAMINOPHEN 500 MG PO TABS
1000.0000 mg | ORAL_TABLET | Freq: Four times a day (QID) | ORAL | Status: DC | PRN
  Administered 2022-04-18: 1000 mg via ORAL
  Filled 2022-04-18: qty 2

## 2022-04-18 MED ORDER — EMPAGLIFLOZIN 25 MG PO TABS: 25.0000 mg | ORAL_TABLET | Freq: Every day | ORAL | Status: DC

## 2022-04-18 MED ORDER — TRAZODONE HCL 50 MG PO TABS
50.0000 mg | ORAL_TABLET | Freq: Every evening | ORAL | Status: DC | PRN
  Administered 2022-04-18: 50 mg via ORAL
  Filled 2022-04-18: qty 1

## 2022-04-18 MED ORDER — MAGNESIUM HYDROXIDE 400 MG/5ML PO SUSP: 30.0000 mL | Freq: Every day | ORAL | Status: DC | PRN

## 2022-04-18 MED ORDER — ALUM & MAG HYDROXIDE-SIMETH 200-200-20 MG/5ML PO SUSP: 30.0000 mL | ORAL | Status: DC | PRN

## 2022-04-18 MED ORDER — NAPROXEN 250 MG PO TABS: 500.0000 mg | ORAL_TABLET | Freq: Two times a day (BID) | ORAL | Status: DC | PRN

## 2022-04-18 NOTE — ED Notes (Signed)
Pt changing into psych scrubs. ED does not have purple bottoms, secretary aware and is ordering bottoms. RN gave pt blue bottoms instead

## 2022-04-18 NOTE — ED Triage Notes (Signed)
Pt here from home with c/o congestion and not feeling well , pt also states that she has been having SI thoughts , hx of same

## 2022-04-18 NOTE — ED Notes (Signed)
Pts belongings in locker 5 in purple zone.Pt states she wanted her phone in the bag and not with security. Pt had clothes, black boots, phone, and phone charger.

## 2022-04-18 NOTE — Progress Notes (Signed)
Pt appropriate to come to OBS 6 will require UDS , EKG. NP Britt Bolognese and RN Tori aware of pt acceptance to [facility] OBS unit.

## 2022-04-18 NOTE — ED Notes (Signed)
Pt states she cannot urinate at this time.

## 2022-04-18 NOTE — ED Notes (Signed)
.  Pt admitted to Obs with suicidal thoughts with no plan. Denies SI currently. Denies HI, & AVH, . Patient was cooperative during the admission assessment. Skin assessment complete. Belongings inventoried. Patient oriented to unit and unit rules. Meal and drinks offered to patient.  Patient verbalized agreement to treatment plans. Patient verbally contracts for safety while hospitalized. Will monitor for safety.

## 2022-04-18 NOTE — ED Notes (Signed)
Pt wanded by security. 

## 2022-04-18 NOTE — ED Provider Notes (Signed)
Accepted to [facility]. [facility] accepts.   Lennice Sites, DO 04/18/22 2005

## 2022-04-18 NOTE — ED Notes (Signed)
NP with psych at bedside
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
## 2022-04-18 NOTE — ED Notes (Signed)
Per Ria Comment at [facility], pt needs a rapid urine drug screen resulted before coming

## 2022-04-18 NOTE — ED Notes (Signed)
Pt was give two warm blankets

## 2022-04-18 NOTE — ED Notes (Signed)
Pt encouraged to provide urine per order.

## 2022-04-18 NOTE — ED Provider Notes (Signed)
[facility] Provider Note   CSN: [id number] Arrival date & time: 04/18/22  1359     History  Chief Complaint  Patient presents with   Suicidal    Grace Montgomery is a 32 y.o. female with PMH significant for for asthma, diabetes, depression, bipolar who presents with concern for congestion, cough, general malaise, as well as SI.  Patient reports that she has had previous suicide attempt by overdose on her pills.  Patient reports that she does not have any plan at this time.  She reports that she was recently kicked out of her apartment, has been living on the street, and thinks that she is getting sick secondary to this.  She reports some alcohol use but denies any drug use.  She denies any attempt at toxic ingestion earlier today.  HPI     Home Medications Prior to Admission medications   Medication Sig Start Date End Date Taking? Authorizing Provider  acetaminophen (TYLENOL) 500 MG tablet Take 2 tablets (1,000 mg total) by mouth every 6 (six) hours as needed for mild pain. 01/12/22   Meuth, Brooke A, PA-C  JARDIANCE 25 MG TABS tablet TAKE 1 TABLET(25 MG) BY MOUTH DAILY Patient taking differently: Take 25 mg by mouth daily. 11/27/21   McDiarmid, Blane Ohara, MD  metFORMIN (GLUCOPHAGE) 1000 MG tablet Take 1 tablet (1,000 mg total) by mouth 2 (two) times daily with a meal. 07/30/21 07/25/22  McDiarmid, Blane Ohara, MD  Multiple Vitamin (MULTIVITAMIN WITH MINERALS) TABS tablet Take 1 tablet by mouth daily. 11/11/20   Ethelene Hal, NP  naproxen (NAPROSYN) 500 MG tablet Take 1 tablet (500 mg total) by mouth 2 (two) times daily as needed for moderate pain. 01/12/22   Meuth, Brooke A, PA-C  ondansetron (ZOFRAN) 4 MG tablet Take 1 tablet (4 mg total) by mouth every 6 (six) hours. 01/09/22   Rex Kras, PA  oxyCODONE (OXY IR/ROXICODONE) 5 MG immediate release tablet Take 1 tablet (5 mg total) by mouth every 6 (six) hours as needed for severe pain. 01/12/22    Meuth, Brooke A, PA-C  polyethylene glycol (MIRALAX) 17 g packet Take 17 g by mouth daily as needed for mild constipation. 01/12/22   Meuth, Brooke A, PA-C      Allergies    Monistat [tioconazole], Chocolate, Ibuprofen, and Banana    Review of Systems   Review of Systems  Respiratory:  Positive for cough and shortness of breath.   All other systems reviewed and are negative.   Physical Exam Updated Vital Signs BP (!) 125/90 (BP Location: Right Arm)   Pulse (!) 109   Temp 99.2 F (37.3 C) (Oral)   Resp 14   SpO2 100%  Physical Exam Vitals and nursing note reviewed.  Constitutional:      General: She is not in acute distress.    Appearance: Normal appearance.  HENT:     Head: Normocephalic and atraumatic.  Eyes:     General:        Right eye: No discharge.        Left eye: No discharge.  Cardiovascular:     Rate and Rhythm: Regular rhythm. Tachycardia present.     Heart sounds: No murmur heard.    No friction rub. No gallop.  Pulmonary:     Effort: Pulmonary effort is normal.     Breath sounds: Normal breath sounds.     Comments: Mild wheezing in right upper lung field Abdominal:  General: Bowel sounds are normal.     Palpations: Abdomen is soft.  Skin:    General: Skin is warm and dry.     Capillary Refill: Capillary refill takes less than 2 seconds.  Neurological:     Mental Status: She is alert and oriented to person, place, and time.  Psychiatric:        Mood and Affect: Mood normal.        Behavior: Behavior normal.     Comments: Endorses passive SI, no plan at this time, no HI, no AVH     ED Results / Procedures / Treatments   Labs (all labs ordered are listed, but only abnormal results are displayed) Labs Reviewed  COMPREHENSIVE METABOLIC PANEL - Abnormal; Notable for the following components:      Result Value   Sodium 134 (*)    CO2 21 (*)    BUN 5 (*)    Calcium 8.8 (*)    Albumin 3.4 (*)    All other components within normal limits   SALICYLATE LEVEL - Abnormal; Notable for the following components:   Salicylate Lvl [id number] (*)    All other components within normal limits  ACETAMINOPHEN LEVEL - Abnormal; Notable for the following components:   Acetaminophen (Tylenol), Serum <10 (*)    All other components within normal limits  CBC - Abnormal; Notable for the following components:   RBC 3.71 (*)    Hemoglobin 9.4 (*)    HCT 30.8 (*)    MCH 25.3 (*)    RDW 19.2 (*)    All other components within normal limits  I-STAT CHEM 8, ED - Abnormal; Notable for the following components:   BUN 4 (*)    Calcium, Ion 1.10 (*)    TCO2 20 (*)    Hemoglobin 11.2 (*)    HCT 33.0 (*)    All other components within normal limits  RESP PANEL BY RT-PCR (RSV, FLU A&B, COVID)  [id number]  ETHANOL  RAPID URINE DRUG SCREEN, HOSP PERFORMED  I-STAT BETA HCG BLOOD, ED ([facility], [facility], [facility] ONLY)    EKG None  Radiology DG Chest 2 View  Result Date: 04/18/2022 CLINICAL DATA:  Shortness of breath, cough EXAM: CHEST - 2 VIEW COMPARISON:  04/22/2015 FINDINGS: The heart size and mediastinal contours are within normal limits. Both lungs are clear. The visualized skeletal structures are unremarkable. IMPRESSION: No active cardiopulmonary disease. Electronically Signed   By: Kathreen Devoid M.D.   On: 04/18/2022 16:27    Procedures Procedures    Medications Ordered in ED Medications  empagliflozin (JARDIANCE) tablet 25 mg (has no administration in time range)  metFORMIN (GLUCOPHAGE) tablet 1,000 mg (has no administration in time range)  naproxen (NAPROSYN) tablet 500 mg (has no administration in time range)  multivitamin with minerals tablet 1 tablet (has no administration in time range)  acetaminophen (TYLENOL) tablet 1,000 mg (has no administration in time range)  albuterol (VENTOLIN HFA) 108 (90 Base) MCG/ACT inhaler 1-2 puff (2 puffs Inhalation Given 04/18/22 1607)    ED Course/ Medical Decision Making/ A&P Clinical Course as of 04/18/22 1759  Sun  Apr 18, 2022  1748 Patient medically cleared at this time, patient talked to the psych team who recommended overnight observation with SI at this time, we will continue to monitor, will order patient's home medications. [CP]    Clinical Course User Index [CP] Anselmo Pickler, PA-C  Medical Decision Making Amount and/or Complexity of Data Reviewed Labs: ordered. Radiology: ordered.  Risk Prescription drug management.   Patient is a 32 y.o. female  who presents to the emergency department for psychiatric complaint.  Past Medical History: asthma, diabetes, depression, bipolar  Physical Exam: Patient does have mild tachycardia, pulse 109, she does have subacute temperature, temperature 99.2 on arrival.  She does have mild wheezing in the right upper lung fields, with no focal consolidation.  Labs: Medical clearance labs ordered.  CMP notable for mild hyponatremia, sodium 134, CBC without leukocytosis, she does have some anemia with hemoglobin 9.4, no significant change from baseline.  RVP negative for COVID, flu, RSV, her toxic ingestion labs are unremarkable with salicylate, significant, ethanol all negative.  Independently interpreted plain film chest x-ray which shows no evidence of acute intrathoracic abnormality.  I agree with the radiologist interpretation.  Medications: Administered albuterol x 1 for mild wheezing, patient with improvement after this.,  I ordered her home meds including diabetes medications.  She spoke with the psych team who recommended that she be monitored overnight given her SI, with discharge with resources in the morning, this is a reasonable plan, she is cleared from medical perspective at this time.  Disposition: Patient is otherwise medically cleared at this time pending medical clearance laboratory evaluation. Will consult TTS and appreciate their recommendations.  Final Clinical Impression(s) / ED Diagnoses Final  diagnoses:  None    Rx / DC Orders ED Discharge Orders     None         Dorien Chihuahua 04/18/22 1800    Dorie Rank, MD 04/19/22 [phone]

## 2022-04-19 DIAGNOSIS — F1010 Alcohol abuse, uncomplicated: Secondary | ICD-10-CM

## 2022-04-19 LAB — GLUCOSE, CAPILLARY: Glucose-Capillary: 103 mg/dL — ABNORMAL HIGH (ref 70–99)

## 2022-04-19 NOTE — Discharge Instructions (Addendum)
Base on the information you have provided and the presenting issue, outpatient resources have been recommended.  It is imperative that you follow through with treatment recommendations within 5-7 days from the of discharge to mitigate further risk to your safety and mental well-being. A list of referrals has been provided below to get you started.  You are not limited to the list provided.  In case of an urgent crisis, you may contact the Mobile Crisis Unit with [facility] at [phone].  Please come to [facility] during walk in hours to establish care for medication management or therapy:   Address:  [address], in [place], [place] Ph: [phone]   New patient medication management hours: Monday-Friday from 8 AM to 11 AM Please show up by 7:00 AM to ensure you are seen  New patient therapy walk in hours: Mondays through Thursdays from 7:30 AM to 4 PM Please show up by 7:00 AM to ensure you are seen   Outpatient SUBSTANCE USE TREATMENT FACILITIES   [facility] [place], [place], [address] [phone] phone NOTE: [facility] is no longer offering IOP services.  Serves those who are low-income or have no insurance.  [facility] [address] [place], [place], [address] [phone] phone [phone] fax NOTE: Does have Substance Abuse-Intensive Outpatient Program [facility]) as well as transitional housing if eligible.  [facility] [place], [place], [address] [phone] phone [phone] fax  [employer] [phone] [place], [place], [address] [phone] phone [phone] fax

## 2022-04-19 NOTE — ED Notes (Signed)
Pt is in the bed sleeping. Respirations are even and unlabored. No acute distress noted. Will continue to monitor for safety. 

## 2022-04-19 NOTE — ED Notes (Signed)
Pt sleeping at present, no distress noted.  Monitoring for safety. 

## 2022-04-19 NOTE — ED Provider Notes (Signed)
[facility]BC/OBS ASAP Discharge Summary  Date and Time: 04/19/2022 2:31 PM  Name: Grace Montgomery  MRN:  [id number]   Discharge Diagnoses:  Final diagnoses:  Suicidal ideation  Alcohol abuse    Subjective:  The patient is a 32 year old female with a history of psychiatric admissions, most recently in 2022, in which she was given a diagnosis of adjustment disorder.  The patient appears to have longstanding issues with alcohol and substance use.  The patient presented to the [facility] behavioral urgent care earlier this morning reporting suicidal thoughts with no particular plan and frustration with unemployment.  Stay Summary:  On assessment 2/12 the patient exhibits a linear and logical thought process.  She is tearful at times during the interview.  She reports that she is frustrated living with her current boyfriend of 5 months because she feels "I have to walk on eggshells".  She also states that people in the house stay up all night long making noise and she has been unable to sleep recently.  She denies experiencing any suicidal thoughts and reports no history of self-injurious behavior.  She denies having access to any guns.  The patient reports significant alcohol consumption.  She states that she has charges for DWI and that the drug has caused issues with her family and relationships.  She reports that she drinks because she wants to feel numb.  She admits that she uses cocaine and marijuana frequently.  She minimizes that the substances are an issue for her and does not want treatment.  Discussed that the patient would benefit from therapy.  She reports she would like to speak to a counselor.  Told her to come upstairs for walk-in hours in the morning Monday through Friday.  She expressed understanding.   LCSW met with the patient and provided resources for her.  Total Time spent with patient: 20 min  Past Psychiatric History: as above Past Medical History: per H and P Family  History: none Family Psychiatric History: none Social History: per H and P Tobacco Cessation:  A prescription for an FDA-approved tobacco cessation medication provided at discharge   Current Medications:  Current Facility-Administered Medications  Medication Dose Route Frequency Provider Last Rate Last Admin   acetaminophen (TYLENOL) tablet 650 mg  650 mg Oral Q6H PRN Bobbitt, Shalon E, NP       alum & mag hydroxide-simeth (MAALOX/MYLANTA) 200-200-20 MG/5ML suspension 30 mL  30 mL Oral Q4H PRN Bobbitt, Shalon E, NP       hydrOXYzine (ATARAX) tablet 25 mg  25 mg Oral TID PRN Bobbitt, Shalon E, NP   25 mg at 04/18/22 2239   magnesium hydroxide (MILK OF MAGNESIA) suspension 30 mL  30 mL Oral Daily PRN Bobbitt, Shalon E, NP       traZODone (DESYREL) tablet 50 mg  50 mg Oral QHS PRN Bobbitt, Shalon E, NP   50 mg at 04/18/22 2239   Current Outpatient Medications  Medication Sig Dispense Refill   albuterol (VENTOLIN HFA) 108 (90 Base) MCG/ACT inhaler Inhale 2 puffs into the lungs every 6 (six) hours as needed for wheezing or shortness of breath.     FLUoxetine (PROZAC) 10 MG capsule Take 10 mg by mouth daily.     metFORMIN (GLUCOPHAGE) 1000 MG tablet Take 1 tablet (1,000 mg total) by mouth 2 (two) times daily with a meal. 180 tablet 3   Multiple Vitamin (MULTIVITAMIN WITH MINERALS) TABS tablet Take 1 tablet by mouth daily.  PTA Medications:  Facility Ordered Medications  Medication   [COMPLETED] albuterol (VENTOLIN HFA) 108 (90 Base) MCG/ACT inhaler 1-2 puff   acetaminophen (TYLENOL) tablet 650 mg   alum & mag hydroxide-simeth (MAALOX/MYLANTA) 200-200-20 MG/5ML suspension 30 mL   magnesium hydroxide (MILK OF MAGNESIA) suspension 30 mL   traZODone (DESYREL) tablet 50 mg   hydrOXYzine (ATARAX) tablet 25 mg   PTA Medications  Medication Sig   metFORMIN (GLUCOPHAGE) 1000 MG tablet Take 1 tablet (1,000 mg total) by mouth 2 (two) times daily with a meal.   FLUoxetine (PROZAC) 10 MG capsule  Take 10 mg by mouth daily.   Multiple Vitamin (MULTIVITAMIN WITH MINERALS) TABS tablet Take 1 tablet by mouth daily.   albuterol (VENTOLIN HFA) 108 (90 Base) MCG/ACT inhaler Inhale 2 puffs into the lungs every 6 (six) hours as needed for wheezing or shortness of breath.       07/30/2021    2:27 PM 07/13/2021    1:39 PM 04/17/2020    4:11 PM  Depression screen PHQ 2/9  Decreased Interest 0 1 1  Down, Depressed, Hopeless 0 1 1  PHQ - 2 Score 0 2 2  Altered sleeping 0 1 3  Tired, decreased energy 0 1 2  Change in appetite 0 1 2  Feeling bad or failure about yourself  0 1 0  Trouble concentrating 0 1 1  Moving slowly or fidgety/restless 0 0 0  Suicidal thoughts 0 0 0  PHQ-9 Score 0 7 10  Difficult doing work/chores Not difficult at all Somewhat difficult Not difficult at all    [facility] ED from 04/18/2022 in [facility] Most recent reading at 04/19/2022 10:58 AM ED from 04/18/2022 in [facility] Emergency Department at [facility] Most recent reading at 04/18/2022  2:39 PM ED to Hosp-Admission (Discharged) from 01/11/2022 in [facility] Most recent reading at 01/11/2022 12:09 AM  C-SSRS RISK CATEGORY No Risk High Risk No Risk      Musculoskeletal  Strength & Muscle Tone: within normal limits Gait & Station: normal Patient leans: N/A  Psychiatric Specialty Exam  Presentation General Appearance: Appropriate for Environment  Eye Contact:Fair  Speech:Clear and Coherent  Speech Volume:Normal  Handedness:-- (not assessed)   Mood and Affect  Mood:Euthymic  Affect:Congruent   Thought Process  Thought Processes:Coherent; Linear  Descriptions of Associations:Intact  Orientation:Full (Time, Place and Person)  Thought Content:Logical    Hallucinations:Hallucinations: None  Ideas of Reference:None  Suicidal Thoughts:Suicidal Thoughts: No  Homicidal Thoughts:Homicidal Thoughts: No   Sensorium   Memory:Immediate Fair; Recent Fair; Remote Fair  Judgment:Fair  Insight:Fair   Executive Functions  Concentration:Fair  Attention Span:Fair  [place]   Psychomotor Activity  Psychomotor Activity:Psychomotor Activity: Normal   Assets  Assets:Communication Skills; Resilience   Sleep  Sleep:Sleep: Fair    Physical Exam Constitutional:      Appearance: the patient is not toxic-appearing.  Pulmonary:     Effort: Pulmonary effort is normal.  Neurological:     General: No focal deficit present.     Mental Status: the patient is alert and oriented to person, place, and time.   Review of Systems  Respiratory:  Negative for shortness of breath.   Cardiovascular:  Negative for chest pain.  Gastrointestinal:  Negative for abdominal pain, constipation, diarrhea, nausea and vomiting.  Neurological:  Negative for headaches.    BP 117/74 (BP Location: Right Arm)   Pulse 80   Temp  99.2 F (37.3 C) (Oral)   Resp 18   SpO2 100%   Demographic Factors:  None pertinent   Loss Factors: NA   Historical Factors: NA   Risk Reduction Factors:   Positive social support, Positive therapeutic relationship, and Positive coping skills or problem solving skills   Continued Clinical Symptoms:  Substance use   Cognitive Features That Contribute To Risk:  None     Suicide Risk:  Mild   Plan Of Care/Follow-up recommendations:  Activity as tolerated. Diet as recommended by PCP. Keep all scheduled follow-up appointments as recommended.   Patient is instructed to take all prescribed medications as recommended. Report any side effects or adverse reactions to your outpatient psychiatrist. Patient is instructed to abstain from alcohol and illegal drugs while on prescription medications. In the event of worsening symptoms, patient is instructed to call the crisis hotline, 911, or go to the nearest emergency department for evaluation and  treatment.     Patient is also instructed prior to discharge to: Take all medications as prescribed by mental healthcare provider. Report any adverse effects and or reactions from the medicines to outpatient provider promptly. Patient has been instructed & cautioned: To not engage in alcohol and or illegal drug use while on prescription medicines. In the event of worsening symptoms,  patient is instructed to call the crisis hotline, 911 and or go to the nearest ED for appropriate evaluation and treatment of symptoms. To follow-up with primary care provider for other medical issues, concerns and or health care needs    Disposition: self care   Corky Sox, MD 04/19/2022, 2:31 PM

## 2022-04-19 NOTE — ED Provider Notes (Signed)
[facility] Continuous Assessment Admission H&P  Date: 04/19/22 Patient Name: Grace Montgomery MRN: [id number] Chief Complaint: Suicidal ideation  Diagnoses:  Final diagnoses:  MDD (major depressive disorder), recurrent episode, moderate (HCC)  Suicidal ideation  Alcohol abuse    HPI: Grace Montgomery is a 32 y/o female with a history of bipolar disorder, GAD, cannabis dependence MDD, suicidal ideations, alcohol abuse presenting to [facility] voluntarily after presenting to [facility] ED with suicidal ideations, congestion, cough and general malaise.  Patient stated that she lost her apartment and that she has been living on the street and she feels like she has been getting sick since she has been out on the street. Patient endorsed having suicidal ideations but no specific plan or intent.  Patient was evaluated in [facility] ED and was recommended patient come to [facility] UC continuous observation.  Grace Montgomery is a 32 y/o female seen face-to-face by this provider and chart reviewed, patient reports been depressed for about 2 weeks with suicidal ideation but no plan or intent.  Patient endorses having racing thoughts, poor sleep with nightmares, anxiety, depression irritability anger and easily frustrated.  Patient reports that her stressors include not having enough money to pay her bills, currently unemployed and homeless.  Patient reports she has 4 children ages 25 and 67 and 3 are currently staying with family.  Patient reports that she last worked at [employer] in October.  Patient reports that she drinks beer and liquor 4 to 5 days a week and this has been her routine for a few years.  When asked how much she drinks patient stated she does not count her drinks.  Patient reports that she was receiving mental health treatment from [facility] [place] she does not last appointment. Patient reports that she takes prozac, but states that she has not taken prozac in a few  years. Patient endorses alcohol use, but denied any other substances. Patient's UDS is positive for cocaine and marijuana.   Patient is alert oriented x 4, speech is clear and coherent  and thought process is logical and goal directed. Patient presents with a depressed mood and affect very guarded and somewhat irritable during assessment.  Patient states that she is cold and hungry and just wants to go to sleep.  Patient continues to endorse suicidal ideation with no plan or intent, denies any homicidal ideation or AVH and does not appear to be responding to any internal or external stimuli at this time.  Patient is not able to contract for safety and will be admitted to [facility] continuous observation for crisis management, safety and stabilization.  Total Time spent with patient: 30 minutes  Musculoskeletal  Strength & Muscle Tone: within normal limits Gait & Station: normal Patient leans: N/A  Psychiatric Specialty Exam  Presentation General Appearance:  Casual  Eye Contact: Fair  Speech: Clear and Coherent  Speech Volume: Normal  Handedness: Right   Mood and Affect  Mood: Depressed  Affect: Blunt   Thought Process  Thought Processes: Coherent  Descriptions of Associations:Intact  Orientation:Full (Time, Place and Person)  Thought Content:Logical  Diagnosis of Schizophrenia or Schizoaffective disorder in past: No   Hallucinations:Hallucinations: None  Ideas of Reference:None  Suicidal Thoughts:Suicidal Thoughts: Yes, Passive SI Active Intent and/or Plan: Without Intent; Without Plan SI Passive Intent and/or Plan: Without Intent; Without Plan  Homicidal Thoughts:Homicidal Thoughts: No   Sensorium  Memory: Immediate Good; Recent Good; Remote Good  Judgment: Fair  Insight: Fair   Materials engineer: Good  Attention Span: Good  Recall: Roel Cluck of Knowledge: Good  Language: Good   Psychomotor Activity  Psychomotor  Activity: Psychomotor Activity: Normal   Assets  Assets: Communication Skills; Desire for Improvement; Social Support   Sleep  Sleep: Sleep: Fair Number of Hours of Sleep: -1   Nutritional Assessment (For OBS and FBC admissions only) Has the patient had a weight loss or gain of 10 pounds or more in the last 3 months?: No Has the patient had a decrease in food intake/or appetite?: No Does the patient have dental problems?: No Has the patient recently lost weight without trying?: 0 Has the patient been eating poorly because of a decreased appetite?: 0 Malnutrition Screening Tool Score: 0    Physical Exam HENT:     Head: Normocephalic and atraumatic.     Nose: Nose normal.     Mouth/Throat:     Mouth: Mucous membranes are moist.  Eyes:     Pupils: Pupils are equal, round, and reactive to light.  Cardiovascular:     Rate and Rhythm: Normal rate.  Pulmonary:     Effort: Pulmonary effort is normal.  Abdominal:     General: Abdomen is flat.  Musculoskeletal:        General: Normal range of motion.     Cervical back: Normal range of motion.  Skin:    General: Skin is warm.  Neurological:     Mental Status: She is alert and oriented to person, place, and time.  Psychiatric:        Attention and Perception: Attention normal.        Mood and Affect: Mood is anxious and depressed.        Speech: Speech normal.        Behavior: Behavior is cooperative.        Thought Content: Thought content is not paranoid or delusional. Thought content includes suicidal ideation. Thought content does not include homicidal ideation. Thought content does not include homicidal or suicidal plan.        Cognition and Memory: Cognition normal.        Judgment: Judgment is impulsive.    Review of Systems  Constitutional: Negative.   HENT: Negative.    Eyes: Negative.   Respiratory: Negative.    Cardiovascular: Negative.   Gastrointestinal: Negative.   Genitourinary: Negative.    Musculoskeletal: Negative.   Skin: Negative.   Neurological: Negative.   Endo/Heme/Allergies: Negative.   Psychiatric/Behavioral:  Positive for depression and suicidal ideas. Negative for hallucinations. The patient is nervous/anxious. The patient does not have insomnia.     Blood pressure 110/74, pulse (!) 101, temperature 98.7 F (37.1 C), temperature source Oral, resp. rate 18, SpO2 100 %. There is no height or weight on file to calculate BMI.  Past Psychiatric History: [facility] 11/2020, hx of suicidal attempts  Is the patient at risk to self? Yes  Has the patient been a risk to self in the past 6 months? Yes .    Has the patient been a risk to self within the distant past? Yes   Is the patient a risk to others? No   Has the patient been a risk to others in the past 6 months? No   Has the patient been a risk to others within the distant past? No   Past Medical History:   Acute post-traumatic headache 06/09/2019    See 06/09/19 ED visit note  Alcohol abuse 11/10/2020   Anxiety     Asthma, mild persistent 06/29/2011   Bipolar affective disorder ([place]) 09/22/2018   Cannabis dependence     Carpal tunnel syndrome of left wrist 02/16/2019   Concussion 06/09/2019    See ED visit note 06/09/19   Diabetes mellitus without complication ([place])     Major depressive disorder      Severe with psychotic features   Marijuana use 10/26/2018   Maternal morbid obesity, 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[place]) 06/29/2011   MDD (major depressive disorder), recurrent severe, without psychosis ([place]) 02/16/2017   Rh negative state in antepartum period 03/02/2018    Will need rho gam   S/P emergency cesarean section 06/18/2018   Seasonal allergies 06/29/2011    On zyrtec OTC     Suicidal behavior      2013, 2018    Family History: Patient states that she does not know her family history  Social History: 32 y/o female unemployed, homeless, 4 children 15, 20, 43, 3  Last Labs:  Admission on 04/18/2022, Discharged on 04/18/2022   Component Date Value Ref Range Status   SARS Coronavirus 2 by RT PCR 04/18/2022 NEGATIVE  NEGATIVE Final   Influenza A by PCR 04/18/2022 NEGATIVE  NEGATIVE Final   Influenza B by PCR 04/18/2022 NEGATIVE  NEGATIVE Final   Comment: (NOTE) The Xpert Xpress SARS-CoV-2/FLU/RSV plus assay is intended as an aid in the diagnosis of influenza from Nasopharyngeal swab specimens and should not be used as a sole basis for treatment. Nasal washings and aspirates are unacceptable for Xpert Xpress SARS-CoV-2/FLU/RSV testing.  Fact Sheet for Patients: EntrepreneurPulse.com.au  Fact Sheet for Healthcare Providers: IncredibleEmployment.be  This test is not yet approved or cleared by the [place] FDA and has been authorized for detection and/or diagnosis of SARS-CoV-2 by FDA under an Emergency Use Authorization (EUA). This EUA will remain in effect (meaning this test can be used) for the duration of the COVID-19 declaration under Section 564(b)(1) of the Act, 21 U.S.C. section 360bbb-3(b)(1), unless the authorization is terminated or revoked.     Resp Syncytial Virus by PCR 04/18/2022 NEGATIVE  NEGATIVE Final   Comment: (NOTE) Fact Sheet for Patients: EntrepreneurPulse.com.au  Fact Sheet for Healthcare Providers: IncredibleEmployment.be  This test is not yet approved or cleared by the [place] FDA and has been authorized for detection and/or diagnosis of SARS-CoV-2 by FDA under an Emergency Use Authorization (EUA). This EUA will remain in effect (meaning this test can be used) for the duration of the COVID-19 declaration under Section 564(b)(1) of the Act, 21 U.S.C. section 360bbb-3(b)(1), unless the authorization is terminated or revoked.  Performed at [facility], [place] [address] [place], [place] [address]    Sodium 04/18/2022 134 (L)  135 - 145 mmol/L Final   Potassium 04/18/2022 3.7  3.5 - 5.1  mmol/L Final   Chloride 04/18/2022 103  98 - 111 mmol/L Final   CO2 04/18/2022 21 (L)  22 - 32 mmol/L Final   Glucose, Bld 04/18/2022 98  70 - 99 mg/dL Final   Glucose reference range applies only to samples taken after fasting for at least 8 hours.   BUN 04/18/2022 5 (L)  6 - 20 mg/dL Final   Creatinine, Ser 04/18/2022 0.78  0.44 - 1.00 mg/dL Final   Calcium 04/18/2022 8.8 (L)  8.9 - 10.3 mg/dL Final   Total Protein 04/18/2022 7.1  6.5 - 8.1 g/dL Final   Albumin 04/18/2022 3.4 (L)  3.5 - 5.0 g/dL  Final   AST 04/18/2022 22  15 - 41 U/L Final   ALT 04/18/2022 15  0 - 44 U/L Final   Alkaline Phosphatase 04/18/2022 64  38 - 126 U/L Final   Total Bilirubin 04/18/2022 0.5  0.3 - 1.2 mg/dL Final   GFR, Estimated 04/18/2022 >60  >60 mL/min Final   Comment: (NOTE) Calculated using the CKD-EPI Creatinine Equation (2021)    Anion gap 04/18/2022 10  5 - 15 Final   Performed at [facility], [place] [address] [place], [place] [address]   Alcohol, Ethyl (B) 04/18/2022 <10  <10 mg/dL Final   Comment: (NOTE) Lowest detectable limit for serum alcohol is 10 mg/dL.  For medical purposes only. Performed at [facility], [place] [address] [place], [place] [id number]    Salicylate Lvl 99991111 <7.0 (L)  7.0 - 30.0 mg/dL Final   Performed at [place] [address] [place], [place] [address]   Acetaminophen (Tylenol), Serum 04/18/2022 <10 (L)  10 - 30 ug/mL Final   Comment: (NOTE) Therapeutic concentrations vary significantly. A range of 10-30 ug/mL  may be an effective concentration for many patients. However, some  are best treated at concentrations outside of this range. Acetaminophen concentrations >150 ug/mL at 4 hours after ingestion  and >50 ug/mL at 12 hours after ingestion are often associated with  toxic reactions.  Performed at [facility], [place] [address], [place], [place] [address]    WBC 04/18/2022 7.7  4.0 - 10.5 K/uL Final   RBC 04/18/2022 3.71 (L)   3.87 - 5.11 MIL/uL Final   Hemoglobin 04/18/2022 9.4 (L)  12.0 - 15.0 g/dL Final   HCT 04/18/2022 30.8 (L)  36.0 - 46.0 % Final   MCV 04/18/2022 83.0  80.0 - 100.0 fL Final   MCH 04/18/2022 25.3 (L)  26.0 - 34.0 pg Final   MCHC 04/18/2022 30.5  30.0 - 36.0 g/dL Final   RDW 04/18/2022 19.2 (H)  11.5 - 15.5 % Final   Platelets 04/18/2022 383  150 - 400 K/uL Final   nRBC 04/18/2022 0.0  0.0 - 0.2 % Final   Performed at [place] [facility] [address], [place], [place] [address]   Opiates 04/18/2022 NONE DETECTED  NONE DETECTED Final   Cocaine 04/18/2022 POSITIVE (A)  NONE DETECTED Final   Benzodiazepines 04/18/2022 NONE DETECTED  NONE DETECTED Final   Amphetamines 04/18/2022 NONE DETECTED  NONE DETECTED Final   Tetrahydrocannabinol 04/18/2022 POSITIVE (A)  NONE DETECTED Final   Barbiturates 04/18/2022 NONE DETECTED  NONE DETECTED Final   Comment: (NOTE) DRUG SCREEN FOR MEDICAL PURPOSES ONLY.  IF CONFIRMATION IS NEEDED FOR ANY PURPOSE, NOTIFY LAB WITHIN 5 DAYS.  LOWEST DETECTABLE LIMITS FOR URINE DRUG SCREEN Drug Class                     Cutoff (ng/mL) Amphetamine and metabolites    1000 Barbiturate and metabolites    200 Benzodiazepine                 200 Opiates and metabolites        300 Cocaine and metabolites        300 THC                            50 Performed at [facility], [place] [address], [place], [place] [address]    Sodium  04/18/2022 138  135 - 145 mmol/L Final   Potassium 04/18/2022 3.8  3.5 - 5.1 mmol/L Final   Chloride 04/18/2022 105  98 - 111 mmol/L Final   BUN 04/18/2022 4 (L)  6 - 20 mg/dL Final   Creatinine, Ser 04/18/2022 0.70  0.44 - 1.00 mg/dL Final   Glucose, Bld 04/18/2022 95  70 - 99 mg/dL Final   Glucose reference range applies only to samples taken after fasting for at least 8 hours.   Calcium, Ion 04/18/2022 1.10 (L)  1.15 - 1.40 mmol/L Final   TCO2 04/18/2022 20 (L)  22 - 32 mmol/L Final   Hemoglobin 04/18/2022 11.2 (L)  12.0 - 15.0 g/dL  Final   HCT 04/18/2022 33.0 (L)  36.0 - 46.0 % Final  Admission on 01/11/2022, Discharged on 01/13/2022  Component Date Value Ref Range Status   WBC 01/11/2022 13.1 (H)  4.0 - 10.5 K/uL Final   RBC 01/11/2022 3.61 (L)  3.87 - 5.11 MIL/uL Final   Hemoglobin 01/11/2022 9.3 (L)  12.0 - 15.0 g/dL Final   HCT 01/11/2022 29.8 (L)  36.0 - 46.0 % Final   MCV 01/11/2022 82.5  80.0 - 100.0 fL Final   MCH 01/11/2022 25.8 (L)  26.0 - 34.0 pg Final   MCHC 01/11/2022 31.2  30.0 - 36.0 g/dL Final   RDW 01/11/2022 17.5 (H)  11.5 - 15.5 % Final   Platelets 01/11/2022 473 (H)  150 - 400 K/uL Final   nRBC 01/11/2022 0.0  0.0 - 0.2 % Final   Neutrophils Relative % 01/11/2022 84  % Final   Neutro Abs 01/11/2022 11.0 (H)  1.7 - 7.7 K/uL Final   Lymphocytes Relative 01/11/2022 8  % Final   Lymphs Abs 01/11/2022 1.1  0.7 - 4.0 K/uL Final   Monocytes Relative 01/11/2022 6  % Final   Monocytes Absolute 01/11/2022 0.8  0.1 - 1.0 K/uL Final   Eosinophils Relative 01/11/2022 1  % Final   Eosinophils Absolute 01/11/2022 0.1  0.0 - 0.5 K/uL Final   Basophils Relative 01/11/2022 0  % Final   Basophils Absolute 01/11/2022 0.0  0.0 - 0.1 K/uL Final   Immature Granulocytes 01/11/2022 1  % Final   Abs Immature Granulocytes 01/11/2022 0.07  0.00 - 0.07 K/uL Final   Performed at [facility], [place] [address], [place], [place] [address]   Sodium 01/11/2022 134 (L)  135 - 145 mmol/L Final   Potassium 01/11/2022 4.1  3.5 - 5.1 mmol/L Final   Chloride 01/11/2022 103  98 - 111 mmol/L Final   CO2 01/11/2022 24  22 - 32 mmol/L Final   Glucose, Bld 01/11/2022 172 (H)  70 - 99 mg/dL Final   Glucose reference range applies only to samples taken after fasting for at least 8 hours.   BUN 01/11/2022 <5 (L)  6 - 20 mg/dL Final   Creatinine, Ser 01/11/2022 0.70  0.44 - 1.00 mg/dL Final   Calcium 01/11/2022 8.7 (L)  8.9 - 10.3 mg/dL Final   Total Protein 01/11/2022 7.7  6.5 - 8.1 g/dL Final   Albumin 01/11/2022 3.1  (L)  3.5 - 5.0 g/dL Final   AST 01/11/2022 138 (H)  15 - 41 U/L Final   ALT 01/11/2022 127 (H)  0 - 44 U/L Final   Alkaline Phosphatase 01/11/2022 131 (H)  38 - 126 U/L Final   Total Bilirubin 01/11/2022 1.7 (H)  0.3 - 1.2 mg/dL Final   GFR, Estimated 01/11/2022 >60  >  60 mL/min Final   Comment: (NOTE) Calculated using the CKD-EPI Creatinine Equation (2021)    Anion gap 01/11/2022 7  5 - 15 Final   Performed at [facility], [place] [address], [place], [place] [address]   Lipase 01/11/2022 27  11 - 51 U/L Final   Performed at [facility], [place] [address], [place], [place] [address]   Color, Urine 01/11/2022 YELLOW  YELLOW Final   APPearance 01/11/2022 HAZY (A)  CLEAR Final   Specific Gravity, Urine 01/11/2022 1.014  1.005 - 1.030 Final   pH 01/11/2022 7.0  5.0 - 8.0 Final   Glucose, UA 01/11/2022 NEGATIVE  NEGATIVE mg/dL Final   Hgb urine dipstick 01/11/2022 LARGE (A)  NEGATIVE Final   Bilirubin Urine 01/11/2022 NEGATIVE  NEGATIVE Final   Ketones, ur 01/11/2022 5 (A)  NEGATIVE mg/dL Final   Protein, ur 01/11/2022 30 (A)  NEGATIVE mg/dL Final   Nitrite 01/11/2022 NEGATIVE  NEGATIVE Final   Leukocytes,Ua 01/11/2022 NEGATIVE  NEGATIVE Final   RBC / HPF 01/11/2022 >50 (H)  0 - 5 RBC/hpf Final   Bacteria, UA 01/11/2022 RARE (A)  NONE SEEN Final   Squamous Epithelial / HPF 01/11/2022 11-20  0 - 5 Final   Mucus 01/11/2022 PRESENT   Final   Performed at [facility], [place] [address] [place], [place] [address]   HIV Screen 4th Generation wRfx 01/11/2022 Non Reactive  Non Reactive Final   Performed at [facility], [place] [address] [place], [place] [address]   MRSA, PCR 01/11/2022 NEGATIVE  NEGATIVE Final   Staphylococcus aureus 01/11/2022 NEGATIVE  NEGATIVE Final   Comment: (NOTE) The Xpert SA Assay (FDA approved for NASAL specimens in patients 94 years of age and older), is one component of a comprehensive surveillance program. It  is not intended to diagnose infection nor to guide or monitor treatment. Performed at [facility], [place] [address] [place], [place] [address]    Glucose-Capillary 01/11/2022 126 (H)  70 - 99 mg/dL Final   Glucose reference range applies only to samples taken after fasting for at least 8 hours.   SURGICAL PATHOLOGY 01/11/2022    Final-Edited                   Value:SURGICAL PATHOLOGY CASE: [id number] PATIENT: Ruwayda Julian Surgical Pathology Report     Clinical History: Cholecystitis (crm)     FINAL MICROSCOPIC DIAGNOSIS:  A. GALLBLADDER, CHOLECYSTECTOMY: -  Acute cholecystitis with thickened/fibrotic gallbladder wall and predominantly denuded gallbladder mucosa with necrosis and calcifications. -  Cholelithiasis.  GROSS DESCRIPTION:  Specimen: Gallbladder, received fresh. Size/?Intact: 10.7 x 4 x 3.2 cm, focally disrupted. Serosal surface: Yellow-pink to dark red with few scattered adhesions. Mucosa/Wall: The mucosa varies from gray-brown to pink-red to dark red, smooth to granular.  The wall is fatty, up to 1.6 cm thick. Contents: 3.3 cm yellow-green brown-black roughened cholelith Cystic duct: Patent Block Summary: Representative sections in 1 block.  SW 01/11/2022    Final Diagnosis performed by Tilford Pillar DO.   Electronically signed 01/12/2022 Technical and / or Professional components performed a                         t [facility], [place] [address] [place], [place] [address].  Immunohistochemistry Technical component (if applicable) was performed at [facility]. [address], [place], [place], [place] [address].   IMMUNOHISTOCHEMISTRY DISCLAIMER (  if applicable): Some of these immunohistochemical stains may have been developed and the performance characteristics determine by [facility]. Some may not have been cleared or approved by the U.S. Food and Drug Administration. The  FDA has determined that such clearance or approval is not necessary. This test is used for clinical purposes. It should not be regarded as investigational or for research. This laboratory is certified under the [employer] (CLIA-88) as qualified to perform high complexity clinical laboratory testing.  The controls stained appropriately.    Glucose-Capillary 01/11/2022 161 (H)  70 - 99 mg/dL Final   Glucose reference range applies only to samples taken after fasting for at least 8 hours.   Hgb A1c MFr Bld 01/11/2022 6.0 (H)  4.8 - 5.6 % Final   Comment: (NOTE) Pre diabetes:          5.7%-6.4%  Diabetes:              >6.4%  Glycemic control for   <7.0% adults with diabetes    Mean Plasma Glucose 01/11/2022 125.5  mg/dL Final   Performed at [facility], [place] [address] [place], [place] [address]   Glucose-Capillary 01/11/2022 201 (H)  70 - 99 mg/dL Final   Glucose reference range applies only to samples taken after fasting for at least 8 hours.   Glucose-Capillary 01/11/2022 193 (H)  70 - 99 mg/dL Final   Glucose reference range applies only to samples taken after fasting for at least 8 hours.   Glucose-Capillary 01/11/2022 214 (H)  70 - 99 mg/dL Final   Glucose reference range applies only to samples taken after fasting for at least 8 hours.   Glucose-Capillary 01/12/2022 143 (H)  70 - 99 mg/dL Final   Glucose reference range applies only to samples taken after fasting for at least 8 hours.   Glucose-Capillary 01/12/2022 219 (H)  70 - 99 mg/dL Final   Glucose reference range applies only to samples taken after fasting for at least 8 hours.   Glucose-Capillary 01/12/2022 179 (H)  70 - 99 mg/dL Final   Glucose reference range applies only to samples taken after fasting for at least 8 hours.   Glucose-Capillary 01/12/2022 260 (H)  70 - 99 mg/dL Final   Glucose reference range applies only to samples taken after fasting for at least 8 hours.    Glucose-Capillary 01/12/2022 188 (H)  70 - 99 mg/dL Final   Glucose reference range applies only to samples taken after fasting for at least 8 hours.   Glucose-Capillary 01/13/2022 115 (H)  70 - 99 mg/dL Final   Glucose reference range applies only to samples taken after fasting for at least 8 hours.   Glucose-Capillary 01/13/2022 220 (H)  70 - 99 mg/dL Final   Glucose reference range applies only to samples taken after fasting for at least 8 hours.  Admission on 01/09/2022, Discharged on 01/09/2022  Component Date Value Ref Range Status   WBC 01/09/2022 13.4 (H)  4.0 - 10.5 K/uL Final   RBC 01/09/2022 3.54 (L)  3.87 - 5.11 MIL/uL Final   Hemoglobin 01/09/2022 9.2 (L)  12.0 - 15.0 g/dL Final   HCT 01/09/2022 28.9 (L)  36.0 - 46.0 % Final   MCV 01/09/2022 81.6  80.0 - 100.0 fL Final   MCH 01/09/2022 26.0  26.0 - 34.0 pg Final   MCHC 01/09/2022 31.8  30.0 - 36.0 g/dL Final   RDW 01/09/2022 17.9 (H)  11.5 -  15.5 % Final   Platelets 01/09/2022 494 (H)  150 - 400 K/uL Final   nRBC 01/09/2022 0.0  0.0 - 0.2 % Final   Neutrophils Relative % 01/09/2022 77  % Final   Neutro Abs 01/09/2022 10.2 (H)  1.7 - 7.7 K/uL Final   Lymphocytes Relative 01/09/2022 17  % Final   Lymphs Abs 01/09/2022 2.3  0.7 - 4.0 K/uL Final   Monocytes Relative 01/09/2022 5  % Final   Monocytes Absolute 01/09/2022 0.6  0.1 - 1.0 K/uL Final   Eosinophils Relative 01/09/2022 1  % Final   Eosinophils Absolute 01/09/2022 0.2  0.0 - 0.5 K/uL Final   Basophils Relative 01/09/2022 0  % Final   Basophils Absolute 01/09/2022 0.1  0.0 - 0.1 K/uL Final   Immature Granulocytes 01/09/2022 0  % Final   Abs Immature Granulocytes 01/09/2022 0.04  0.00 - 0.07 K/uL Final   Performed at [facility], [place] [address] [place], [place] [address]   Sodium 01/09/2022 135  135 - 145 mmol/L Final   Potassium 01/09/2022 3.6  3.5 - 5.1 mmol/L Final   Chloride 01/09/2022 105  98 - 111 mmol/L Final   CO2 01/09/2022 23  22 - 32 mmol/L Final    Glucose, Bld 01/09/2022 176 (H)  70 - 99 mg/dL Final   Glucose reference range applies only to samples taken after fasting for at least 8 hours.   BUN 01/09/2022 8  6 - 20 mg/dL Final   Creatinine, Ser 01/09/2022 0.99  0.44 - 1.00 mg/dL Final   Calcium 01/09/2022 8.6 (L)  8.9 - 10.3 mg/dL Final   Total Protein 01/09/2022 6.8  6.5 - 8.1 g/dL Final   Albumin 01/09/2022 3.0 (L)  3.5 - 5.0 g/dL Final   AST 01/09/2022 25  15 - 41 U/L Final   ALT 01/09/2022 19  0 - 44 U/L Final   Alkaline Phosphatase 01/09/2022 78  38 - 126 U/L Final   Total Bilirubin 01/09/2022 0.2 (L)  0.3 - 1.2 mg/dL Final   GFR, Estimated 01/09/2022 >60  >60 mL/min Final   Comment: (NOTE) Calculated using the CKD-EPI Creatinine Equation (2021)    Anion gap 01/09/2022 7  5 - 15 Final   Performed at [place] [address] [place], [place] [address]   Lipase 01/09/2022 28  11 - 51 U/L Final   Performed at [facility] [address], [place], [place] [address]   I-stat hCG, quantitative 01/09/2022 <5.0  <5 mIU/mL Final   Comment 3 01/09/2022          Final   Comment:   GEST. AGE      CONC.  (mIU/mL)   <=1 WEEK        5 - 50     2 WEEKS       50 - 500     3 WEEKS       100 - 10,000     4 WEEKS     1,000 - 30,000        FEMALE AND NON-PREGNANT FEMALE:     LESS THAN 5 mIU/mL     Allergies: Monistat [tioconazole], Chocolate, Ibuprofen, and Banana  Medications:  Facility Ordered Medications  Medication   [COMPLETED] albuterol (VENTOLIN HFA) 108 (90 Base) MCG/ACT inhaler 1-2 puff   acetaminophen (TYLENOL) tablet 650 mg   alum & mag hydroxide-simeth (MAALOX/MYLANTA) 200-200-20 MG/5ML suspension 30 mL   magnesium hydroxide (MILK OF MAGNESIA)  suspension 30 mL   traZODone (DESYREL) tablet 50 mg   hydrOXYzine (ATARAX) tablet 25 mg   PTA Medications  Medication Sig   Multiple Vitamin (MULTIVITAMIN WITH MINERALS) TABS tablet Take 1 tablet by mouth daily.   metFORMIN (GLUCOPHAGE) 1000 MG tablet Take 1  tablet (1,000 mg total) by mouth 2 (two) times daily with a meal.   JARDIANCE 25 MG TABS tablet TAKE 1 TABLET(25 MG) BY MOUTH DAILY (Patient taking differently: Take 25 mg by mouth daily.)   ondansetron (ZOFRAN) 4 MG tablet Take 1 tablet (4 mg total) by mouth every 6 (six) hours.   polyethylene glycol (MIRALAX) 17 g packet Take 17 g by mouth daily as needed for mild constipation.   acetaminophen (TYLENOL) 500 MG tablet Take 2 tablets (1,000 mg total) by mouth every 6 (six) hours as needed for mild pain.   naproxen (NAPROSYN) 500 MG tablet Take 1 tablet (500 mg total) by mouth 2 (two) times daily as needed for moderate pain.   oxyCODONE (OXY IR/ROXICODONE) 5 MG immediate release tablet Take 1 tablet (5 mg total) by mouth every 6 (six) hours as needed for severe pain.    Medical Decision Making  Grace Montgomery is a 32 y/o female with a history of bipolar disorder, GAD, cannabis dependence MDD, suicidal ideations, alcohol abuse presenting to [facility] voluntarily after presenting to [facility], ED with suicidal ideations, congestion, cough and general malaise.  Patient stated that she lost her apartment and that she has been living on the street and she feels like she has been getting sick since she has been out on the street. Patient endorsed having suicidal ideations but no specific plan or intent.  Patient was evaluated in [facility], ED and was recommended patient come to [facility] continuous observation.    Recommendations  Based on my evaluation the patient does not appear to have an emergency medical condition.  Patient will be admitted to [facility] continued observation for crisis management, stabilization and safety.  Lucia Bitter, NP 04/19/22  6:29 AM

## 2022-04-24 ENCOUNTER — Other Ambulatory Visit: Payer: Self-pay

## 2022-04-24 ENCOUNTER — Emergency Department (HOSPITAL_COMMUNITY): Payer: Medicaid Other

## 2022-04-24 ENCOUNTER — Emergency Department (HOSPITAL_COMMUNITY)
Admission: EM | Admit: 2022-04-24 | Discharge: 2022-04-24 | Disposition: A | Discharge status: Home / Self Care | Payer: Medicaid Other | Attending: Emergency Medicine | Admitting: Emergency Medicine

## 2022-04-24 DIAGNOSIS — E876 Hypokalemia: Secondary | ICD-10-CM | POA: Insufficient documentation

## 2022-04-24 DIAGNOSIS — S60222A Contusion of left hand, initial encounter: Secondary | ICD-10-CM | POA: Diagnosis not present

## 2022-04-24 DIAGNOSIS — E119 Type 2 diabetes mellitus without complications: Secondary | ICD-10-CM | POA: Insufficient documentation

## 2022-04-24 DIAGNOSIS — Z7984 Long term (current) use of oral hypoglycemic drugs: Secondary | ICD-10-CM | POA: Insufficient documentation

## 2022-04-24 DIAGNOSIS — R000 Tachycardia, unspecified: Secondary | ICD-10-CM | POA: Diagnosis not present

## 2022-04-24 DIAGNOSIS — Z1152 Encounter for screening for COVID-19: Secondary | ICD-10-CM | POA: Diagnosis not present

## 2022-04-24 DIAGNOSIS — R42 Dizziness and giddiness: Secondary | ICD-10-CM | POA: Insufficient documentation

## 2022-04-24 DIAGNOSIS — K529 Noninfective gastroenteritis and colitis, unspecified: Secondary | ICD-10-CM | POA: Diagnosis not present

## 2022-04-24 DIAGNOSIS — S6992XA Unspecified injury of left wrist, hand and finger(s), initial encounter: Secondary | ICD-10-CM | POA: Diagnosis present

## 2022-04-24 LAB — COMPREHENSIVE METABOLIC PANEL
ALT: 31 U/L (ref 0–44)
AST: 32 U/L (ref 15–41)
Albumin: 3.1 g/dL — ABNORMAL LOW (ref 3.5–5.0)
Alkaline Phosphatase: 70 U/L (ref 38–126)
Anion gap: 12 (ref 5–15)
BUN: <5 mg/dL — ABNORMAL LOW (ref 6–20)
CO2: 23 mmol/L (ref 22–32)
Calcium: 8.7 mg/dL — ABNORMAL LOW (ref 8.9–10.3)
Chloride: 100 mmol/L (ref 98–111)
Creatinine, Ser: 0.9 mg/dL (ref 0.44–1.00)
GFR, Estimated: >60 mL/min (ref >60)
Glucose, Bld: 150 mg/dL — ABNORMAL HIGH (ref 70–99)
Potassium: 3.4 mmol/L — ABNORMAL LOW (ref 3.5–5.1)
Sodium: 135 mmol/L (ref 135–145)
Total Bilirubin: 0.5 mg/dL (ref 0.3–1.2)
Total Protein: 7.4 g/dL (ref 6.5–8.1)

## 2022-04-24 LAB — CBC WITH DIFFERENTIAL/PLATELET
Abs Immature Granulocytes: 0.03 10*3/uL (ref 0.00–0.07)
Basophils Absolute: 0.1 10*3/uL (ref 0.0–0.1)
Basophils Relative: 1 %
Eosinophils Absolute: 0.2 10*3/uL (ref 0.0–0.5)
Eosinophils Relative: 2 %
HCT: 27.2 % — ABNORMAL LOW (ref 36.0–46.0)
Hemoglobin: 8.9 g/dL — ABNORMAL LOW (ref 12.0–15.0)
Immature Granulocytes: 0 %
Lymphocytes Relative: 26 %
Lymphs Abs: 2.4 10*3/uL (ref 0.7–4.0)
MCH: 26.3 pg (ref 26.0–34.0)
MCHC: 32.7 g/dL (ref 30.0–36.0)
MCV: 80.5 fL (ref 80.0–100.0)
Monocytes Absolute: 0.6 10*3/uL (ref 0.1–1.0)
Monocytes Relative: 7 %
Neutro Abs: 5.8 10*3/uL (ref 1.7–7.7)
Neutrophils Relative %: 64 %
Platelets: 400 10*3/uL (ref 150–400)
RBC: 3.38 MIL/uL — ABNORMAL LOW (ref 3.87–5.11)
RDW: 18.7 % — ABNORMAL HIGH (ref 11.5–15.5)
WBC: 9 10*3/uL (ref 4.0–10.5)
nRBC: 0 % (ref 0.0–0.2)

## 2022-04-24 LAB — RESP PANEL BY RT-PCR (RSV, FLU A&B, COVID)  RVPGX2
Influenza A by PCR: NEGATIVE
Influenza B by PCR: NEGATIVE
Resp Syncytial Virus by PCR: NEGATIVE
SARS Coronavirus 2 by RT PCR: NEGATIVE

## 2022-04-24 LAB — I-STAT BETA HCG BLOOD, ED (MC, WL, AP ONLY): I-stat hCG, quantitative: <5 m[IU]/mL (ref <5)

## 2022-04-24 LAB — CBG MONITORING, ED: Glucose-Capillary: 165 mg/dL — ABNORMAL HIGH (ref 70–99)

## 2022-04-24 LAB — LIPASE, BLOOD: Lipase: 30 U/L (ref 11–51)

## 2022-04-24 MED ORDER — POTASSIUM CHLORIDE CRYS ER 20 MEQ PO TBCR
40.0000 meq | EXTENDED_RELEASE_TABLET | Freq: Once | ORAL | Status: AC
  Administered 2022-04-24: 40 meq via ORAL
  Filled 2022-04-24: qty 2

## 2022-04-24 MED ORDER — LACTATED RINGERS IV BOLUS
1000.0000 mL | Freq: Once | INTRAVENOUS | Status: AC
  Administered 2022-04-24: 1000 mL via INTRAVENOUS

## 2022-04-24 MED ORDER — ACETAMINOPHEN 325 MG PO TABS
650.0000 mg | ORAL_TABLET | Freq: Once | ORAL | Status: AC
  Administered 2022-04-24: 650 mg via ORAL
  Filled 2022-04-24: qty 2

## 2022-04-24 MED ORDER — ONDANSETRON 4 MG PO TBDP: 4.0000 mg | ORAL_TABLET | Freq: Three times a day (TID) | ORAL | 0 refills | Status: DC | PRN

## 2022-04-24 MED ORDER — ONDANSETRON HCL 4 MG/2ML IJ SOLN
4.0000 mg | Freq: Once | INTRAMUSCULAR | Status: AC
  Administered 2022-04-24: 4 mg via INTRAVENOUS
  Filled 2022-04-24: qty 2

## 2022-04-24 NOTE — ED Triage Notes (Addendum)
Pt stated she has been dizzy since last night with emesis. Pt states she has been around her cousin that test +COVID.   Pt denies hx vertigo and states she is able to move fine. Pt states she feels like the room is moving.
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
## 2022-04-24 NOTE — ED Notes (Addendum)
Pt ambulated to restroom with steady gait. RN provided urine cup for sample.  Pt states she has had diarrhea that started last night as well.
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
## 2022-04-24 NOTE — Discharge Instructions (Addendum)
If you develop worsening, continued, or recurrent abdominal pain, uncontrolled vomiting, fever, chest or back pain, or any other new/concerning symptoms then return to the ER for evaluation.  

## 2022-05-01 ENCOUNTER — Encounter (HOSPITAL_COMMUNITY): Payer: Self-pay

## 2022-05-01 ENCOUNTER — Emergency Department (HOSPITAL_COMMUNITY)
Admission: EM | Admit: 2022-05-01 | Discharge: 2022-05-01 | Disposition: A | Discharge status: Home / Self Care | Payer: Medicaid Other | Attending: Emergency Medicine | Admitting: Emergency Medicine

## 2022-05-01 ENCOUNTER — Other Ambulatory Visit: Payer: Self-pay

## 2022-05-01 DIAGNOSIS — L292 Pruritus vulvae: Secondary | ICD-10-CM | POA: Diagnosis present

## 2022-05-01 DIAGNOSIS — B9689 Other specified bacterial agents as the cause of diseases classified elsewhere: Secondary | ICD-10-CM

## 2022-05-01 DIAGNOSIS — E119 Type 2 diabetes mellitus without complications: Secondary | ICD-10-CM | POA: Insufficient documentation

## 2022-05-01 DIAGNOSIS — Z113 Encounter for screening for infections with a predominantly sexual mode of transmission: Secondary | ICD-10-CM

## 2022-05-01 DIAGNOSIS — N760 Acute vaginitis: Secondary | ICD-10-CM | POA: Diagnosis not present

## 2022-05-01 DIAGNOSIS — Z202 Contact with and (suspected) exposure to infections with a predominantly sexual mode of transmission: Secondary | ICD-10-CM | POA: Diagnosis not present

## 2022-05-01 DIAGNOSIS — Z7984 Long term (current) use of oral hypoglycemic drugs: Secondary | ICD-10-CM | POA: Insufficient documentation

## 2022-05-01 LAB — WET PREP, GENITAL
Sperm: NONE SEEN
Trich, Wet Prep: NONE SEEN
WBC, Wet Prep HPF POC: >=10 — AB (ref <10)
Yeast Wet Prep HPF POC: NONE SEEN

## 2022-05-01 LAB — URINALYSIS, ROUTINE W REFLEX MICROSCOPIC
Bacteria, UA: NONE SEEN
Bilirubin Urine: NEGATIVE
Glucose, UA: >=500 mg/dL — AB
Hgb urine dipstick: NEGATIVE
Ketones, ur: NEGATIVE mg/dL
Nitrite: NEGATIVE
Protein, ur: NEGATIVE mg/dL
Specific Gravity, Urine: 1.03 (ref 1.005–1.030)
pH: 5 (ref 5.0–8.0)

## 2022-05-01 LAB — HIV ANTIBODY (ROUTINE TESTING W REFLEX): HIV Screen 4th Generation wRfx: NONREACTIVE

## 2022-05-01 LAB — I-STAT BETA HCG BLOOD, ED (MC, WL, AP ONLY): I-stat hCG, quantitative: <5 m[IU]/mL (ref <5)

## 2022-05-01 MED ORDER — CEFTRIAXONE SODIUM 500 MG IJ SOLR
500.0000 mg | Freq: Once | INTRAMUSCULAR | Status: AC
  Administered 2022-05-01: 500 mg via INTRAMUSCULAR
  Filled 2022-05-01: qty 500

## 2022-05-01 MED ORDER — DOXYCYCLINE HYCLATE 100 MG PO CAPS: 100.0000 mg | ORAL_CAPSULE | Freq: Two times a day (BID) | ORAL | 0 refills | Status: DC

## 2022-05-01 MED ORDER — METRONIDAZOLE 500 MG PO TABS: 500.0000 mg | ORAL_TABLET | Freq: Two times a day (BID) | ORAL | 0 refills | Status: DC

## 2022-05-01 MED ORDER — STERILE WATER FOR INJECTION IJ SOLN
INTRAMUSCULAR | Status: AC
  Administered 2022-05-01: 1 mL
  Filled 2022-05-01: qty 10

## 2022-05-01 NOTE — ED Provider Notes (Signed)
[facility] Provider Note   CSN: [id number] Arrival date & time: 05/01/22  1119     History  Chief Complaint  Patient presents with   Exposure to STD    Grace Montgomery is a 32 y.o. female with history significant for DM presents to the ED for an STD check stating that her partner was exposed to chlamydia.  She reports some mild vaginal itching as well as some white discharge but suspected the discharge was related to end of her menses.  Denies abnormal vaginal bleeding, dysuria, dyspareunia, genital sore, menstrual problem, pelvic pain, vaginal pain, abdominal pain, fever.      Home Medications Prior to Admission medications   Medication Sig Start Date End Date Taking? Authorizing Provider  doxycycline (VIBRAMYCIN) 100 MG capsule Take 1 capsule (100 mg total) by mouth 2 (two) times daily. 05/01/22  Yes Nihal Doan R, PA  metroNIDAZOLE (FLAGYL) 500 MG tablet Take 1 tablet (500 mg total) by mouth 2 (two) times daily. 05/01/22  Yes Keturah Yerby R, PA  albuterol (VENTOLIN HFA) 108 (90 Base) MCG/ACT inhaler Inhale 2 puffs into the lungs every 6 (six) hours as needed for wheezing or shortness of breath.    [provider]  FLUoxetine (PROZAC) 10 MG capsule Take 10 mg by mouth daily.    [provider]  metFORMIN (GLUCOPHAGE) 1000 MG tablet Take 1 tablet (1,000 mg total) by mouth 2 (two) times daily with a meal. 07/30/21 07/25/22  McDiarmid, Blane Ohara, MD  Multiple Vitamin (MULTIVITAMIN WITH MINERALS) TABS tablet Take 1 tablet by mouth daily.    [provider]  ondansetron (ZOFRAN-ODT) 4 MG disintegrating tablet Take 1 tablet (4 mg total) by mouth every 8 (eight) hours as needed for nausea or vomiting. 04/24/22   Sherwood Gambler, MD      Allergies    Monistat [tioconazole], Chocolate, Ibuprofen, and Banana    Review of Systems   Review of Systems  Constitutional:  Negative for fever.  Gastrointestinal:  Negative for  abdominal pain.  Genitourinary:  Positive for vaginal discharge. Negative for decreased urine volume, dyspareunia, dysuria, genital sores, menstrual problem, pelvic pain, vaginal bleeding and vaginal pain.       Vaginal itching    Physical Exam Updated Vital Signs BP 129/81   Pulse (!) 111   Temp 98.7 F (37.1 C)   Resp 16   LMP 04/24/2022   SpO2 100%  Physical Exam Vitals and nursing note reviewed. Exam conducted with a chaperone present.  Constitutional:      General: She is not in acute distress.    Appearance: Normal appearance. She is not ill-appearing or diaphoretic.  Cardiovascular:     Rate and Rhythm: Normal rate and regular rhythm.  Pulmonary:     Effort: Pulmonary effort is normal.  Abdominal:     General: Abdomen is flat.     Palpations: Abdomen is soft.     Tenderness: There is no abdominal tenderness.  Genitourinary:    General: Normal vulva.     Exam position: Lithotomy position.     Pubic Area: No rash.      Labia:        Right: No rash or lesion.        Left: No rash or lesion.      Vagina: No signs of injury. Vaginal discharge (white, thick) present. No erythema, tenderness, bleeding or lesions.     Cervix: Discharge (white, thick) present. No cervical  motion tenderness, friability, lesion, erythema or cervical bleeding.     Uterus: Normal.   Skin:    General: Skin is warm and dry.  Neurological:     Mental Status: She is alert. Mental status is at baseline.  Psychiatric:        Mood and Affect: Mood normal.        Behavior: Behavior normal.     ED Results / Procedures / Treatments   Labs (all labs ordered are listed, but only abnormal results are displayed) Labs Reviewed  WET PREP, GENITAL - Abnormal; Notable for the following components:      Result Value   Clue Cells Wet Prep HPF POC PRESENT (*)    WBC, Wet Prep HPF POC >=10 (*)    All other components within normal limits  URINALYSIS, ROUTINE W REFLEX MICROSCOPIC - Abnormal; Notable for  the following components:   APPearance CLOUDY (*)    Glucose, UA >=500 (*)    Leukocytes,Ua TRACE (*)    All other components within normal limits  RPR  HIV ANTIBODY (ROUTINE TESTING W REFLEX)  I-STAT BETA HCG BLOOD, ED ([facility], [facility], [facility] ONLY)  GC/CHLAMYDIA PROBE AMP ([place]) NOT AT [facility]    EKG None  Radiology No results found.  Procedures Procedures    Medications Ordered in ED Medications  cefTRIAXone (ROCEPHIN) injection 500 mg (has no administration in time range)    ED Course/ Medical Decision Making/ A&P                             Medical Decision Making  This patient presents to the ED with chief complaint(s) of STD exposure, vaginal itching and discharge with pertinent past medical history of DM.  The complaint involves an extensive differential diagnosis and also carries with it a high risk of complications and morbidity.    The differential diagnosis includes gonorrhea, chlamydia, bacterial vaginosis, trichomoniasis, HPV, HIV, vaginal irritation, normal discharge  The initial plan is to perform pelvic exam and obtain STD/STI testing  Initial Assessment:   On exam, patient does not have any abdominal tenderness to palpation.  During pelvic exam, patient does have thick, white discharge in the vagina coming from the cervical os.  Swabs were obtained successfully.  There was no friability or CMT.  Patient tolerated the exam and did not report any excessive vaginal pain.  Vulva is normal without evidence of lesions or rash.  Independent ECG/labs interpretation:  The following labs were independently interpreted:  UA with glucosuria and trace leukocytes.  No bacteriuria.  Wet prep with clue cells and large amount of WBCs.  No evidence of trichomoniasis, yeast, or sperm.  HIV, RPR, GC/chlamydia are pending.  Treatment and Reassessment: Based on likely exposure to chlamydia, will treat patient empirically for GC/chlamydia.  Patient does also have evidence of bacterial  vaginosis.  Will treat for this as well with Flagyl.  Patient will receive IM Rocephin shot in ED, and 2 prescriptions for oral antibiotics for outpatient.  Recommended patient follow-up with her gynecologist if she continues to have symptoms.  Disposition:   The patient has been appropriately medically screened and/or stabilized in the ED. I have low suspicion for any other emergent medical condition which would require further screening, evaluation or treatment in the ED or require inpatient management. At time of discharge the patient is hemodynamically stable and in no acute distress. I have discussed work-up results and diagnosis with patient  and answered all questions. Patient is agreeable with discharge plan. We discussed strict return precautions for returning to the emergency department and they verbalized understanding.           Final Clinical Impression(s) / ED Diagnoses Final diagnoses:  Bacterial vaginosis  STD exposure  Screen for STD (sexually transmitted disease)    Rx / DC Orders ED Discharge Orders          Ordered    metroNIDAZOLE (FLAGYL) 500 MG tablet  2 times daily        05/01/22 1417    doxycycline (VIBRAMYCIN) 100 MG capsule  2 times daily        05/01/22 1417              Pat Kocher, [place] 05/01/22 1419    Audley Hose, MD 05/01/22 1456

## 2022-05-01 NOTE — ED Triage Notes (Signed)
Pt here for STD check, was told by her partner he has chlamydia; endorses vaginal itching, denies unusual discharge

## 2022-05-01 NOTE — Care Management (Addendum)
Patient states he could not get medication ( antibiotics) until Monday. She has Medicaid therefore she is not eligible for [facility] medication assistance. Caryl Pina, RN will let her know when DC that she will not qualify

## 2022-05-01 NOTE — ED Notes (Signed)
AVS reviewed with pt prior to discharge. Pt verbalizes understanding. Belongings with pt upon depart. Ambulatory to POV by self

## 2022-05-01 NOTE — Discharge Instructions (Signed)
Thank you for allowing me to be part of your care today.  You did test positive for bacterial vaginosis, which we will treat with metronidazole (Flagyl).  You will take this medication for 7 days.  Do not consume alcohol while taking this medication.  We are also treating you empirically for gonorrhea and chlamydia.  You received an antibiotic shot while in the ED, and you will take 7 days of doxycycline along with your metronidazole.  I recommend avoiding sexual intercourse until your symptoms have resolved and you have completed antibiotic therapy.  I recommend following up with your primary care provider or gynecologist if you continue to have prolonged symptoms.  Return to the ED if you have worsening of your symptoms or if you have any new concerns.
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
## 2022-05-02 LAB — RPR: RPR Ser Ql: NONREACTIVE

## 2022-05-03 LAB — GC/CHLAMYDIA PROBE AMP (~~LOC~~) NOT AT ARMC
Chlamydia: NEGATIVE
Comment: NEGATIVE
Comment: NORMAL
Neisseria Gonorrhea: NEGATIVE

## 2022-08-27 ENCOUNTER — Other Ambulatory Visit: Payer: Self-pay | Admitting: Family Medicine

## 2022-08-27 DIAGNOSIS — E1165 Type 2 diabetes mellitus with hyperglycemia: Secondary | ICD-10-CM

## 2022-08-27 DIAGNOSIS — E1169 Type 2 diabetes mellitus with other specified complication: Secondary | ICD-10-CM

## 2022-08-30 NOTE — Telephone Encounter (Signed)
[facility] before further refills  

## 2022-09-06 ENCOUNTER — Ambulatory Visit (HOSPITAL_COMMUNITY): Payer: Medicaid Other | Admitting: Mental Health

## 2022-09-06 ENCOUNTER — Telehealth (HOSPITAL_COMMUNITY): Payer: Self-pay | Admitting: Mental Health

## 2022-09-06 ENCOUNTER — Encounter (HOSPITAL_COMMUNITY): Payer: Self-pay

## 2022-09-06 NOTE — Telephone Encounter (Signed)
Therapist sent link for tele-assessment; no response after x 10 minutes. Called and voicemail picked up that stated phone was broken with new number of [phone]. Therapist sent link to number provided and contacted with message reporting number can not be reached. NS

## 2023-01-18 ENCOUNTER — Other Ambulatory Visit: Payer: Self-pay

## 2023-01-18 ENCOUNTER — Emergency Department (HOSPITAL_COMMUNITY)
Admission: EM | Admit: 2023-01-18 | Discharge: 2023-01-18 | Disposition: A | Discharge status: Home / Self Care | Payer: MEDICAID | Attending: Emergency Medicine | Admitting: Emergency Medicine

## 2023-01-18 ENCOUNTER — Emergency Department (HOSPITAL_COMMUNITY): Payer: MEDICAID

## 2023-01-18 DIAGNOSIS — J45909 Unspecified asthma, uncomplicated: Secondary | ICD-10-CM | POA: Insufficient documentation

## 2023-01-18 DIAGNOSIS — R0789 Other chest pain: Secondary | ICD-10-CM | POA: Diagnosis present

## 2023-01-18 DIAGNOSIS — Z20822 Contact with and (suspected) exposure to covid-19: Secondary | ICD-10-CM | POA: Insufficient documentation

## 2023-01-18 DIAGNOSIS — E119 Type 2 diabetes mellitus without complications: Secondary | ICD-10-CM | POA: Diagnosis not present

## 2023-01-18 DIAGNOSIS — Z7984 Long term (current) use of oral hypoglycemic drugs: Secondary | ICD-10-CM | POA: Diagnosis not present

## 2023-01-18 LAB — CBC WITH DIFFERENTIAL/PLATELET
Abs Immature Granulocytes: 0.02 10*3/uL (ref 0.00–0.07)
Basophils Absolute: 0 10*3/uL (ref 0.0–0.1)
Basophils Relative: 1 %
Eosinophils Absolute: 0.1 10*3/uL (ref 0.0–0.5)
Eosinophils Relative: 2 %
HCT: 35.8 % — ABNORMAL LOW (ref 36.0–46.0)
Hemoglobin: 11.4 g/dL — ABNORMAL LOW (ref 12.0–15.0)
Immature Granulocytes: 0 %
Lymphocytes Relative: 51 %
Lymphs Abs: 3.3 10*3/uL (ref 0.7–4.0)
MCH: 27.7 pg (ref 26.0–34.0)
MCHC: 31.8 g/dL (ref 30.0–36.0)
MCV: 86.9 fL (ref 80.0–100.0)
Monocytes Absolute: 0.4 10*3/uL (ref 0.1–1.0)
Monocytes Relative: 6 %
Neutro Abs: 2.5 10*3/uL (ref 1.7–7.7)
Neutrophils Relative %: 40 %
Platelets: 405 10*3/uL — ABNORMAL HIGH (ref 150–400)
RBC: 4.12 MIL/uL (ref 3.87–5.11)
RDW: 18 % — ABNORMAL HIGH (ref 11.5–15.5)
WBC: 6.3 10*3/uL (ref 4.0–10.5)
nRBC: 0 % (ref 0.0–0.2)

## 2023-01-18 LAB — RESP PANEL BY RT-PCR (RSV, FLU A&B, COVID)  RVPGX2
Influenza A by PCR: NEGATIVE
Influenza B by PCR: NEGATIVE
Resp Syncytial Virus by PCR: NEGATIVE
SARS Coronavirus 2 by RT PCR: NEGATIVE

## 2023-01-18 LAB — CBG MONITORING, ED: Glucose-Capillary: 96 mg/dL (ref 70–99)

## 2023-01-18 LAB — BASIC METABOLIC PANEL
Anion gap: 9 (ref 5–15)
BUN: 11 mg/dL (ref 6–20)
CO2: 21 mmol/L — ABNORMAL LOW (ref 22–32)
Calcium: 9.2 mg/dL (ref 8.9–10.3)
Chloride: 105 mmol/L (ref 98–111)
Creatinine, Ser: 0.79 mg/dL (ref 0.44–1.00)
GFR, Estimated: >60 mL/min (ref >60)
Glucose, Bld: 89 mg/dL (ref 70–99)
Potassium: 3.8 mmol/L (ref 3.5–5.1)
Sodium: 135 mmol/L (ref 135–145)

## 2023-01-18 LAB — TROPONIN I (HIGH SENSITIVITY): Troponin I (High Sensitivity): <2 ng/L (ref <18)

## 2023-01-18 MED ORDER — ACETAMINOPHEN 500 MG PO TABS
1000.0000 mg | ORAL_TABLET | Freq: Once | ORAL | Status: AC
  Administered 2023-01-18: 1000 mg via ORAL
  Filled 2023-01-18: qty 2

## 2023-01-18 MED ORDER — LIDOCAINE 5 % EX PTCH
1.0000 | MEDICATED_PATCH | Freq: Once | CUTANEOUS | Status: DC
  Administered 2023-01-18: 1 via TRANSDERMAL
  Filled 2023-01-18: qty 1

## 2023-01-18 NOTE — Discharge Instructions (Signed)
Your test here did not suggest that this is a heart attack.  Please follow-up with your family doctor in the office.  Please return for worsening or persistent symptoms.

## 2023-01-18 NOTE — Progress Notes (Signed)
IV consult requested placed to obtain delayed labs. Primary RN/Paramedic notified that IVT is unable to support floors with lab draws.

## 2023-01-18 NOTE — ED Provider Notes (Signed)
Received patient in turnover from Dr. Theresia Lo.  Please see their note for further details of Hx, PE.  Briefly patient is a 32 y.o. female with a Arm Pain and Nasal Congestion .  Patient with chest pain, awaiting labs.  Troponin negative, no leukocytosis, no acute anemia, no significant electrolyte abnormalities.  Chest x-ray independently interpreted by me without focal infiltrate or pneumothorax.  Will discharge home.  PCP follow-up.    Melene Plan, DO 01/18/23 2034

## 2023-01-18 NOTE — ED Provider Notes (Signed)
[place] EMERGENCY DEPARTMENT AT [facility] Provider Note   CSN: [id number] Arrival date & time: 01/18/23  0865     History  Chief Complaint  Patient presents with   Arm Pain   Nasal Congestion    Grace Montgomery is a 32 y.o. female.  Patient is a 32 year old female with a past medical history of asthma and diabetes presenting to the emergency department with chest pain.  The patient states that she woke up around 1 AM last night with right sided chest pain radiating into her right arm.  She states that she tried to switch positions and go back to sleep however when she woke up this morning she was still having the chest pain.  She states when she got to the waiting room today the chest pain slowly started to ease off but lasted for several hours this morning.  She reported some mild associated shortness of breath.  She denies any fever or cough, nausea or vomiting.  Denies any numbness or weakness in her arm.  Denies any recent trauma or falls or recent heavy lifting.  She states she has not taken anything for pain yet today.  Patient denies any family history of early cardiac disease.  Patient denies any recent hospitalization or surgery, recent travel in the car or plane, hormone use or cancer history or prior history of VTE.  The history is provided by the patient.  Arm Pain       Home Medications Prior to Admission medications   Medication Sig Start Date End Date Taking? Authorizing Provider  albuterol (VENTOLIN HFA) 108 (90 Base) MCG/ACT inhaler Inhale 2 puffs into the lungs every 6 (six) hours as needed for wheezing or shortness of breath.    [provider]  doxycycline (VIBRAMYCIN) 100 MG capsule Take 1 capsule (100 mg total) by mouth 2 (two) times daily. 05/01/22   Clark, Meghan R, PA-C  FLUoxetine (PROZAC) 10 MG capsule Take 10 mg by mouth daily.    [provider]  metFORMIN (GLUCOPHAGE) 1000 MG tablet TAKE 1 TABLET(1000 MG) BY MOUTH TWICE  DAILY WITH A MEAL 08/30/22   McDiarmid, Leighton Roach, MD  metroNIDAZOLE (FLAGYL) 500 MG tablet Take 1 tablet (500 mg total) by mouth 2 (two) times daily. 05/01/22   Melton Alar R, PA-C  Multiple Vitamin (MULTIVITAMIN WITH MINERALS) TABS tablet Take 1 tablet by mouth daily.    [provider]  ondansetron (ZOFRAN-ODT) 4 MG disintegrating tablet Take 1 tablet (4 mg total) by mouth every 8 (eight) hours as needed for nausea or vomiting. 04/24/22   Pricilla Loveless, MD      Allergies    Monistat [tioconazole], Chocolate, Ibuprofen, and Banana    Review of Systems   Review of Systems  Physical Exam Updated Vital Signs BP (!) 149/113 (BP Location: Left Arm)   Pulse 62   Temp 98 F (36.7 C) (Oral)   Resp 17   Ht 5\' 3"  (1.6 m)   Wt 88.5 kg   SpO2 100%   BMI 34.54 kg/m  Physical Exam Vitals and nursing note reviewed.  Constitutional:      General: She is not in acute distress.    Appearance: Normal appearance.  HENT:     Head: Normocephalic and atraumatic.     Nose: Nose normal.     Mouth/Throat:     Mouth: Mucous membranes are moist.     Pharynx: Oropharynx is clear.  Eyes:     Extraocular Movements:  Extraocular movements intact.     Conjunctiva/sclera: Conjunctivae normal.  Cardiovascular:     Rate and Rhythm: Normal rate and regular rhythm.     Heart sounds: Normal heart sounds.  Pulmonary:     Effort: Pulmonary effort is normal.     Breath sounds: Normal breath sounds.  Abdominal:     General: Abdomen is flat.     Palpations: Abdomen is soft.     Tenderness: There is no abdominal tenderness.  Musculoskeletal:        General: Normal range of motion.     Cervical back: Normal range of motion.     Comments: Right sided chest wall and right shoulder tenderness to palpation, shoulder ROM intact, no overlying skin changes  Skin:    General: Skin is warm and dry.  Neurological:     General: No focal deficit present.     Mental Status: She is alert and oriented to person,  place, and time.  Psychiatric:        Mood and Affect: Mood normal.        Behavior: Behavior normal.     ED Results / Procedures / Treatments   Labs (all labs ordered are listed, but only abnormal results are displayed) Labs Reviewed  RESP PANEL BY RT-PCR (RSV, FLU A&B, COVID)  [id number]  CBC WITH DIFFERENTIAL/PLATELET  BASIC METABOLIC PANEL  CBG MONITORING, ED  TROPONIN I (HIGH SENSITIVITY)  TROPONIN I (HIGH SENSITIVITY)    EKG EKG Interpretation Date/Time:  Tuesday January 18 2023 14:21:18 EST Ventricular Rate:  61 PR Interval:  50 QRS Duration:  104 QT Interval:  407 QTC Calculation: 410 R Axis:   70  Text Interpretation: Sinus rhythm Short PR interval Interpretation limited secondary to artifact Otherwise no significant change Confirmed by Elayne Snare ([id number]) on 01/18/2023 2:44:37 PM  Radiology DG Chest 2 View  Result Date: 01/18/2023 CLINICAL DATA:  cough EXAM: CHEST - 2 VIEW COMPARISON:  February 2024 FINDINGS: The cardiomediastinal silhouette is within normal limits. No pleural effusion. No pneumothorax. No mass or consolidation. No acute osseous abnormality. IMPRESSION: No acute findings in the chest. Electronically Signed   By: Olive Bass M.D.   On: 01/18/2023 14:25    Procedures Procedures    Medications Ordered in ED Medications  lidocaine (LIDODERM) 5 % 1-3 patch (1 patch Transdermal Patch Applied 01/18/23 1410)  acetaminophen (TYLENOL) tablet 1,000 mg (1,000 mg Oral Given 01/18/23 1343)    ED Course/ Medical Decision Making/ A&P Clinical Course as of 01/18/23 1643  Tue Jan 18, 2023  1554 Patient with difficult IV access delaying labs. Patient reports feeling shaky and will have glucose check pending labs. [VK]  1642 Patient signed out to Dr. Adela Lank pending labs. Plan for discharge if normal. [VK]    Clinical Course User Index [VK] Rexford Maus, DO                                 Medical Decision Making This patient presents to the  ED with chief complaint(s) of chest pain with pertinent past medical history of diabetes which further complicates the presenting complaint. The complaint involves an extensive differential diagnosis and also carries with it a high risk of complications and morbidity.    The differential diagnosis includes ACS, arrhythmia, anemia, pneumonia, pneumothorax, pulmonary edema, pleural effusion, MSK pain, she is PERC negative making PE unlikely  Additional history obtained: Additional history obtained from N/A  Records reviewed N/A  ED Course and Reassessment: On patient's arrival she is hemodynamically stable in no acute distress.  Was initially evaluated in triage and had viral swab and chest x-ray performed.  Due to her history of diabetes, will have EKG and labs including a troponin.  She will be treated with Tylenol and lidocaine patch and will be closely reassessed.  Independent labs interpretation:  The following labs were independently interpreted: glucose normal, viral swab negative  Independent visualization of imaging: - I independently visualized the following imaging with scope of interpretation limited to determining acute life threatening conditions related to emergency care: CXR, which revealed no acute disease     Amount and/or Complexity of Data Reviewed Labs: ordered. Radiology: ordered.  Risk OTC drugs. Prescription drug management.          Final Clinical Impression(s) / ED Diagnoses Final diagnoses:  Right-sided chest wall pain    Rx / DC Orders ED Discharge Orders     None         Rexford Maus, DO 01/18/23 1643

## 2023-01-18 NOTE — ED Triage Notes (Signed)
Pt woke up with right sided shoulder, side, chest wall pain. Denies trauma. 12L unremarkable  140/80 HR 72 RR 18 96% RA CBG 118

## 2023-02-05 ENCOUNTER — Other Ambulatory Visit: Payer: Self-pay | Admitting: Family Medicine

## 2023-02-05 ENCOUNTER — Telehealth: Payer: Self-pay | Admitting: Family Medicine

## 2023-02-05 DIAGNOSIS — E1165 Type 2 diabetes mellitus with hyperglycemia: Secondary | ICD-10-CM

## 2023-02-05 DIAGNOSIS — E1169 Type 2 diabetes mellitus with other specified complication: Secondary | ICD-10-CM

## 2023-02-05 MED ORDER — METFORMIN HCL 1000 MG PO TABS: 1000.0000 mg | ORAL_TABLET | Freq: Two times a day (BID) | ORAL | 0 refills | Status: DC

## 2023-02-05 NOTE — Telephone Encounter (Signed)
After-hours call:  Received after-hours call from patient about being out of her metformin.  States that she knows she has an appointment before this can be refilled.  She attempted to call yesterday our clinic to make an appointment, however we were close for the Thanksgiving holiday.  Patient denies abdominal pain nausea or vomiting.  Does have some increased urination and thirst.  Patient comfortably talking over the phone.  Scheduled patient for appointment on 12/3 with Dr. Jena Gauss.  Appropriate to refill metformin until that appointment which I have sent to her pharmacy.

## 2023-02-09 ENCOUNTER — Ambulatory Visit (INDEPENDENT_AMBULATORY_CARE_PROVIDER_SITE_OTHER): Payer: MEDICAID | Admitting: Family Medicine

## 2023-02-09 ENCOUNTER — Telehealth: Payer: Self-pay | Admitting: Family Medicine

## 2023-02-09 ENCOUNTER — Ambulatory Visit: Payer: Self-pay | Admitting: Student

## 2023-02-09 VITALS — BP 111/72 | HR 96 | Ht 63.0 in | Wt 199.4 lb

## 2023-02-09 DIAGNOSIS — F332 Major depressive disorder, recurrent severe without psychotic features: Secondary | ICD-10-CM

## 2023-02-09 DIAGNOSIS — E119 Type 2 diabetes mellitus without complications: Secondary | ICD-10-CM | POA: Diagnosis not present

## 2023-02-09 DIAGNOSIS — Z5986 Financial insecurity: Secondary | ICD-10-CM | POA: Diagnosis not present

## 2023-02-09 LAB — POCT GLYCOSYLATED HEMOGLOBIN (HGB A1C): HbA1c, POC (controlled diabetic range): 5.8 % (ref 0.0–7.0)

## 2023-02-09 MED ORDER — ONETOUCH DELICA LANCING DEV MISC: 0 refills | Status: DC

## 2023-02-09 MED ORDER — ONETOUCH VERIO W/DEVICE KIT: PACK | 0 refills | Status: DC

## 2023-02-09 MED ORDER — DULOXETINE HCL 20 MG PO CPEP: 20.0000 mg | ORAL_CAPSULE | Freq: Every day | ORAL | 1 refills | Status: DC

## 2023-02-09 MED ORDER — METFORMIN HCL 1000 MG PO TABS: 1000.0000 mg | ORAL_TABLET | Freq: Two times a day (BID) | ORAL | 0 refills | Status: DC

## 2023-02-09 MED ORDER — ONETOUCH DELICA LANCETS 33G MISC: 3 refills | Status: DC

## 2023-02-09 MED ORDER — ONETOUCH VERIO VI STRP: ORAL_STRIP | 3 refills | Status: DC

## 2023-02-09 NOTE — Progress Notes (Signed)
    SUBJECTIVE:   CHIEF COMPLAINT / HPI:   PHQ9 answer 1 to question 9. Previous suicide attempt when she was 18 she reports. Per chart review this is documented as an accidental OD. Denies plan for suicide or self harm. Having financial difficulties, with paying for new apartment. Lost disability payment which was through her daughter. Primary caretaker of daughter. Denies firearms in the house. Talks to mother for support. Feels that this hopeless sometimes. Is familiar with [facility]. Calls 911 when she feels she is overwhelmed. Has old therapist with [facility], but had to stop due to transportation.  Called after-hours urgent line for refill of metformin last week.  Is not checking sugars at home but would like to.  Reports she has previously checked but lost the equipment.  PERTINENT  PMH / PSH: MDD, type 2 diabetes  OBJECTIVE:   BP 111/72   Pulse 96   Ht 5\' 3"  (1.6 m)   Wt 199 lb 6.4 oz (90.4 kg)   SpO2 100%   BMI 35.32 kg/m   General: NAD  Neuro: A&O Cardiovascular: RRR, no murmurs, no peripheral edema Respiratory: normal WOB on RA, CTAB, no wheezes, ronchi or rales Abdomen: soft, NTTP, no rebound or guarding Extremities: Moving all 4 extremities equally   ASSESSMENT/PLAN:   Assessment & Plan Severe episode of recurrent major depressive disorder, without psychotic features (HCC) Passively suicidal at this time. Based on my discussion suspect she is low risk for safety concern to herself. Has strong life motivators as she takes care of her daughter. Discussed safety plan with patient, she feels comfortable calling friends and her mother if she has worsening suicidal thoughts.  Also she knows to call and go to behavioral health urgent care when she is feeling overwhelmed.  States she will [facility] at [facility].  Additionally patient is agreeable to start duloxetine, provided with additional counseling resources.  Follow-up in 2 weeks.  Social work consult as below  for financial impact on mood. Type 2 diabetes mellitus without complication, without long-term current use of insulin (HCC) A1c well controlled today at 5.8.  Metformin refill 1000 mg twice daily.  Ordered CBG check equipment to her pharmacy. Financial insecurity Significant financial difficulties impacting mood as described above.  Social work consult placed urgently.  Return in about 2 weeks (around 02/23/2023) for Depression f/u.  Celine Mans, MD [facility]

## 2023-02-09 NOTE — Patient Instructions (Addendum)
It was great to see you! Thank you for allowing me to participate in your care!  Our plans for today:  -I placed a referral to social worker to help with your financial difficulties, they should call you within the week. -Please make sure if you are feeling overwhelmed or have further thoughts of harming yourself to go to the behavioral health Hospital you can call them at [phone] is the suicide and crisis hotline. -Please start taking the medication duloxetine once daily to help with your depression. -Your diabetes is doing well I have refilled your metformin. -Please make an appointment in 2 weeks to discuss your blood pressure and back pain.   Please arrive 15 minutes PRIOR to your next scheduled appointment time! If you do not, this affects OTHER patients' care.  Take care and seek immediate care sooner if you develop any concerns.   Celine Mans, MD, PGY-2 [facility] 11:56 AM 02/09/2023  [facility]    Therapy and Counseling Resources Most providers on this list will take Medicaid. Patients with commercial insurance or Medicare should contact their insurance company to get a list of in network providers.  [facility] (takes children) Location 1: [address], [address] [place], [place] [address] Location 2: [address] [place], [place] [address] [phone]   [facility] (spanish speaking therapist available)(habla espanol)(take medicare and medicaid)  [address], [place], [place] [address], [email or web address] [email or web address] [phone]  [facility]ing [address] [place], [place] [address] [phone]  [facility]   [address], [address] [place], [place] [address]      [phone]  [facility] (spanish available) [address]  [place], [place] [address] [phone]  [facility] (take [facility] and medicare) [address], [address]  [place], [place] [address]       [phone]     [facility] (virtual only) [phone]  [facility]ov[facility]uss[facility] [address] [place], [place] [phone]  [facility]:  [address] [place] [phone]  [facility]:  [address] [place] [phone]  [facility] (under & uninsured) [address]   [place] [phone]    [email or web address]    [facility] [address]  [place]    [phone]  [facility] -[address]     Phone:  [phone]     [facility]-  [address]  [phone]   [facility] [address]      [place], [place] [phone]  [facility]: [address] [place], [place], [place]  [phone]   [facility] (Spanish therapist) [email or web address]  [address] [place]  [address]   [place] [address]    [phone]    [facility]   [address],  [place], [place]  [phone]   [facility]  [address] [place]  [phone]  [facility]  [address] [place], [place]        [phone]  Open Access/Walk In Clinic under & uninsured  [facility]  [address] [place], [place] [phone] Crisis [phone]  [facility] [place],  (Spanish)   [address], [place]: [phone]) 8:30 - 12; 1 - 2:30  [facility]  [facility] [employer],  [address] [place]    [phone]):8:30 - 12; 2 - 3PM  [facility] [employer],  [address]  [place]; [phone]):   Mon - Fri 8 AM - 5 PM  [facility] [address] [place]  MWF 12:30 to 3:00 or call to schedule an appointment  [phone]  Specific Provider options Psychology Today   [email or web address] click on find a therapist  enter your zip code left side and select or tailor a therapist for your specific need.   [facility] Provider Directory [email or web address]  (Medicaid)   Follow all drop down to find a provider  Social Support program [facility] [place] [phone] or PhotoSolver.pl [address], [place], [place] Recovery support and educational   24- Hour Availability:   [facility]  [address] [place], [place] [phone] Crisis [phone]  [facility] [phone]  [facility]  [phone]   [facility]  [phone] (after hours)  Therapeutic Alternative/Mobile Crisis   [phone]  [place] National Suicide Hotline  [phone] Len Childs)  Call 911 or go to emergency room  [facility]  [phone]);  [place] and [employer]  [phone]); [place], [place], [place], [place], [place], [place], [place]

## 2023-02-09 NOTE — Assessment & Plan Note (Signed)
A1c well controlled today at 5.8.  Metformin refill 1000 mg twice daily.  Ordered CBG check equipment to her pharmacy.

## 2023-02-09 NOTE — Assessment & Plan Note (Signed)
Passively suicidal at this time. Based on my discussion suspect she is low risk for safety concern to herself. Has strong life motivators as she takes care of her daughter. Discussed safety plan with patient, she feels comfortable calling friends and her mother if she has worsening suicidal thoughts.  Also she knows to call and go to behavioral health urgent care when she is feeling overwhelmed.  States she will [facility] at [facility].  Additionally patient is agreeable to start duloxetine, provided with additional counseling resources.  Follow-up in 2 weeks.  Social work consult as below for financial impact on mood.

## 2023-02-09 NOTE — Progress Notes (Signed)
Reviewed and agree with Dr Koval's plan.   

## 2023-02-10 ENCOUNTER — Other Ambulatory Visit: Payer: Self-pay

## 2023-02-10 DIAGNOSIS — E119 Type 2 diabetes mellitus without complications: Secondary | ICD-10-CM

## 2023-02-11 ENCOUNTER — Encounter (HOSPITAL_COMMUNITY): Payer: Self-pay

## 2023-02-11 ENCOUNTER — Emergency Department (HOSPITAL_COMMUNITY)
Admission: EM | Admit: 2023-02-11 | Discharge: 2023-02-12 | Disposition: A | Discharge status: Other Acute Inpatient Hospital | Payer: MEDICAID | Attending: Student | Admitting: Student

## 2023-02-11 ENCOUNTER — Other Ambulatory Visit: Payer: Self-pay

## 2023-02-11 ENCOUNTER — Other Ambulatory Visit: Payer: Self-pay | Admitting: Family Medicine

## 2023-02-11 DIAGNOSIS — F17200 Nicotine dependence, unspecified, uncomplicated: Secondary | ICD-10-CM | POA: Insufficient documentation

## 2023-02-11 DIAGNOSIS — Y908 Blood alcohol level of 240 mg/100 ml or more: Secondary | ICD-10-CM | POA: Insufficient documentation

## 2023-02-11 DIAGNOSIS — J45909 Unspecified asthma, uncomplicated: Secondary | ICD-10-CM | POA: Insufficient documentation

## 2023-02-11 DIAGNOSIS — R45851 Suicidal ideations: Secondary | ICD-10-CM | POA: Insufficient documentation

## 2023-02-11 DIAGNOSIS — F332 Major depressive disorder, recurrent severe without psychotic features: Secondary | ICD-10-CM | POA: Insufficient documentation

## 2023-02-11 DIAGNOSIS — E119 Type 2 diabetes mellitus without complications: Secondary | ICD-10-CM | POA: Insufficient documentation

## 2023-02-11 DIAGNOSIS — F1099 Alcohol use, unspecified with unspecified alcohol-induced disorder: Secondary | ICD-10-CM | POA: Diagnosis not present

## 2023-02-11 DIAGNOSIS — Z7984 Long term (current) use of oral hypoglycemic drugs: Secondary | ICD-10-CM | POA: Diagnosis not present

## 2023-02-11 DIAGNOSIS — Z7951 Long term (current) use of inhaled steroids: Secondary | ICD-10-CM | POA: Diagnosis not present

## 2023-02-11 LAB — COMPREHENSIVE METABOLIC PANEL
ALT: 12 U/L (ref 0–44)
AST: 13 U/L — ABNORMAL LOW (ref 15–41)
Albumin: 3.9 g/dL (ref 3.5–5.0)
Alkaline Phosphatase: 65 U/L (ref 38–126)
Anion gap: 14 (ref 5–15)
BUN: 7 mg/dL (ref 6–20)
CO2: 18 mmol/L — ABNORMAL LOW (ref 22–32)
Calcium: 8.6 mg/dL — ABNORMAL LOW (ref 8.9–10.3)
Chloride: 112 mmol/L — ABNORMAL HIGH (ref 98–111)
Creatinine, Ser: 0.62 mg/dL (ref 0.44–1.00)
GFR, Estimated: >60 mL/min (ref >60)
Glucose, Bld: 85 mg/dL (ref 70–99)
Potassium: 3.4 mmol/L — ABNORMAL LOW (ref 3.5–5.1)
Sodium: 144 mmol/L (ref 135–145)
Total Bilirubin: 0.3 mg/dL (ref <1.2)
Total Protein: 7.8 g/dL (ref 6.5–8.1)

## 2023-02-11 LAB — CBC
HCT: 39 % (ref 36.0–46.0)
Hemoglobin: 12.4 g/dL (ref 12.0–15.0)
MCH: 28.1 pg (ref 26.0–34.0)
MCHC: 31.8 g/dL (ref 30.0–36.0)
MCV: 88.2 fL (ref 80.0–100.0)
Platelets: 462 10*3/uL — ABNORMAL HIGH (ref 150–400)
RBC: 4.42 MIL/uL (ref 3.87–5.11)
RDW: 17.8 % — ABNORMAL HIGH (ref 11.5–15.5)
WBC: 7.9 10*3/uL (ref 4.0–10.5)
nRBC: 0 % (ref 0.0–0.2)

## 2023-02-11 LAB — ACETAMINOPHEN LEVEL: Acetaminophen (Tylenol), Serum: <10 ug/mL — ABNORMAL LOW (ref 10–30)

## 2023-02-11 LAB — HCG, SERUM, QUALITATIVE: Preg, Serum: NEGATIVE

## 2023-02-11 LAB — ETHANOL: Alcohol, Ethyl (B): 325 mg/dL (ref <10)

## 2023-02-11 LAB — SALICYLATE LEVEL: Salicylate Lvl: <7 mg/dL — ABNORMAL LOW (ref 7.0–30.0)

## 2023-02-11 MED ORDER — METFORMIN HCL 1000 MG PO TABS: 1000.0000 mg | ORAL_TABLET | Freq: Two times a day (BID) | ORAL | 1 refills | Status: DC

## 2023-02-11 MED ORDER — ONETOUCH DELICA LANCETS 33G MISC
3 refills | Status: DC
Start: 2023-02-11 — End: 2023-02-17

## 2023-02-11 MED ORDER — ONETOUCH VERIO VI STRP
ORAL_STRIP | 3 refills | Status: DC
Start: 2023-02-11 — End: 2023-02-17

## 2023-02-11 MED ORDER — OLANZAPINE 10 MG IM SOLR
10.0000 mg | Freq: Once | INTRAMUSCULAR | Status: AC
  Administered 2023-02-11: 10 mg via INTRAMUSCULAR
  Filled 2023-02-11: qty 10

## 2023-02-11 NOTE — ED Notes (Signed)
Pt belongings: one sweatshirt, one shirt, pants, Nike boots, cell phone, keys- placed in locker #31 Pt allowed to keep hair covering after it was taken off and checked.

## 2023-02-11 NOTE — ED Triage Notes (Signed)
Pt BIB [employer] under IVC for suicidal, ETOH. Pt reports taking 6 Duloxetine, drank 2 40s, and 2 shots of tequila. Pt states she has financial troubles and hates being in [place], and this is why she wants "to end it".

## 2023-02-11 NOTE — ED Notes (Signed)
Pt has attempted to walk out twice already, redirected by PD

## 2023-02-11 NOTE — ED Provider Notes (Signed)
Attempted to see this patient, per RN Marchelle Folks she is currently asleep, RN will notify this provider when patient wakes up.

## 2023-02-11 NOTE — ED Notes (Signed)
Pt probation officer at bedside, pt signed Consent for Release of Information form, placed in chart

## 2023-02-11 NOTE — ED Notes (Signed)
Pt saying she wants to go home, that she knows her rights, then turned to go back into room and slammed the door

## 2023-02-11 NOTE — ED Notes (Signed)
Per pt request, Pt mother, Vaniah Riofrio was notified that pt is here in the ED under IVC.

## 2023-02-11 NOTE — BH Assessment (Addendum)
The consult for patient @1956   has been deferred to [facility]. [place], the [facility] Care Coordinator, will now oversee and coordinate the necessary services. For any inquiries or follow-up, please contact the [facility] Telecare Coordinator (TCC) at [phone].  The [facility] provider will notify the care team via chat once the consult time is scheduled. The [facility] care team has been updated accordingly.

## 2023-02-11 NOTE — ED Notes (Signed)
TTS not done at this time due to pt sleeping after medication

## 2023-02-11 NOTE — ED Provider Notes (Signed)
[place] EMERGENCY DEPARTMENT AT [facility] Provider Note   CSN: [id number] Arrival date & time: 02/11/23  1406     History  Chief Complaint  Patient presents with   Suicidal    Grace Montgomery is a 32 y.o. female with past medical history of asthma, diabetes, SI presents to emergency department following taking 6 duloxetine 20 mg and ingesting alcohol to kill herself.  She called 911 this morning and told them "I will be dead by 03/08/23". She stated that she was looking for weapons to commit suicide.  HPI     Home Medications Prior to Admission medications   Medication Sig Start Date End Date Taking? Authorizing Provider  albuterol (VENTOLIN HFA) 108 (90 Base) MCG/ACT inhaler Inhale 2 puffs into the lungs every 6 (six) hours as needed for wheezing or shortness of breath.    [provider]  Blood Glucose Monitoring [facility] VERIO) w/Device KIT Check blood sugar once daily 02/09/23   Celine Mans, MD  doxycycline (VIBRAMYCIN) 100 MG capsule Take 1 capsule (100 mg total) by mouth 2 (two) times daily. 05/01/22   Clark, Meghan R, PA-C  DULoxetine (CYMBALTA) 20 MG capsule Take 1 capsule (20 mg total) by mouth daily. 02/09/23   Celine Mans, MD  glucose blood [facility] VERIO) test strip Check blood sugar once daily 02/11/23   McDiarmid, Leighton Roach, MD  Lancet Devices (ONE TOUCH DELICA LANCING DEV) MISC Check blood sugar once daily 02/09/23   Celine Mans, MD  metFORMIN (GLUCOPHAGE) 1000 MG tablet Take 1 tablet (1,000 mg total) by mouth 2 (two) times daily with a meal for 180 doses. 02/11/23 05/12/23  McDiarmid, Leighton Roach, MD  metroNIDAZOLE (FLAGYL) 500 MG tablet Take 1 tablet (500 mg total) by mouth 2 (two) times daily. 05/01/22   Melton Alar R, PA-C  Multiple Vitamin (MULTIVITAMIN WITH MINERALS) TABS tablet Take 1 tablet by mouth daily.    [provider]  ondansetron (ZOFRAN-ODT) 4 MG disintegrating tablet Take 1 tablet (4 mg total) by mouth every  8 (eight) hours as needed for nausea or vomiting. 04/24/22   Pricilla Loveless, MD  OneTouch Delica Lancets 33G MISC Check blood sugar once daily 02/11/23   McDiarmid, Leighton Roach, MD      Allergies    Monistat [tioconazole], Chocolate, Ibuprofen, and Banana    Review of Systems   Review of Systems  Constitutional:  Negative for chills, fatigue and fever.  Respiratory:  Negative for cough, chest tightness, shortness of breath and wheezing.   Cardiovascular:  Negative for chest pain and palpitations.  Gastrointestinal:  Negative for abdominal pain, constipation, diarrhea, nausea and vomiting.  Neurological:  Negative for dizziness, seizures, weakness, light-headedness, numbness and headaches.  Psychiatric/Behavioral:  Positive for suicidal ideas.     Physical Exam Updated Vital Signs BP 105/88   Pulse (!) 103   Temp 98.2 F (36.8 C)   Resp 18   SpO2 98%  Physical Exam Vitals and nursing note reviewed.  Constitutional:      General: She is not in acute distress.    Appearance: Normal appearance.  HENT:     Head: Normocephalic and atraumatic.  Eyes:     Conjunctiva/sclera: Conjunctivae normal.  Cardiovascular:     Rate and Rhythm: Normal rate.  Pulmonary:     Effort: Pulmonary effort is normal. No respiratory distress.  Skin:    General: Skin is warm.     Capillary Refill: Capillary refill takes less than 2 seconds.  Coloration: Skin is not jaundiced or pale.  Neurological:     Mental Status: She is alert and oriented to person, place, and time. Mental status is at baseline.  Psychiatric:     Comments: +SI + intentional self harm by ingestion of Cymbalta and ETOH Denies hallucinations    ED Results / Procedures / Treatments   Labs (all labs ordered are listed, but only abnormal results are displayed) Labs Reviewed  COMPREHENSIVE METABOLIC PANEL - Abnormal; Notable for the following components:      Result Value   Potassium 3.4 (*)    Chloride 112 (*)    CO2 18 (*)     Calcium 8.6 (*)    AST 13 (*)    All other components within normal limits  ETHANOL - Abnormal; Notable for the following components:   Alcohol, Ethyl (B) 325 (*)    All other components within normal limits  SALICYLATE LEVEL - Abnormal; Notable for the following components:   Salicylate Lvl <7.0 (*)    All other components within normal limits  ACETAMINOPHEN LEVEL - Abnormal; Notable for the following components:   Acetaminophen (Tylenol), Serum <10 (*)    All other components within normal limits  CBC - Abnormal; Notable for the following components:   RDW 17.8 (*)    Platelets 462 (*)    All other components within normal limits  HCG, SERUM, QUALITATIVE  RAPID URINE DRUG SCREEN, HOSP PERFORMED    EKG None  Radiology No results found.  Procedures Procedures    Medications Ordered in ED Medications  OLANZapine (ZYPREXA) injection 10 mg (10 mg Intramuscular Given 02/11/23 1626)    ED Course/ Medical Decision Making/ A&P                                 Medical Decision Making Amount and/or Complexity of Data Reviewed Labs: ordered.  Risk Prescription drug management.   Patient presents to the ED for concern of SI, this involves an extensive number of treatment options, and is a complaint that carries with it a high risk of complications and morbidity.   Co morbidities that complicate the patient evaluation  Previous SI   Additional history obtained:  Additional history obtained from Nursing and Outside Medical Records   External records from outside source obtained and reviewed including triage RN note and IVC paperwork from  PD   Lab Tests:  I Ordered, and personally interpreted labs.  The pertinent results include:  ETOH 325   Cardiac Monitoring:  The patient was maintained on a cardiac monitor.  I personally viewed and interpreted the cardiac monitored which showed an underlying rhythm of: sinus tachycardia and prolonged QT    Consultations  Obtained:  I requested consultation with the TTS - pending at sign out   Problem List / ED Course:  SI   Reevaluation:  After the interventions noted above, I reevaluated the patient and found that they have :stayed the same   Dispostion:  After consideration of the diagnostic results and the patients response to treatment, I feel that the patent would benefit from TTS consult and likely admission for [facility].   TTS pending at sign out to [facility] Final Clinical Impression(s) / ED Diagnoses Final diagnoses:  Suicidal ideation    Rx / DC Orders ED Discharge Orders     None        Judithann Sheen, [place] 02/11/23 2148  Zadie Rhine, MD 02/12/23 [phone]

## 2023-02-12 ENCOUNTER — Encounter (HOSPITAL_COMMUNITY): Payer: Self-pay | Admitting: Adult Health

## 2023-02-12 ENCOUNTER — Inpatient Hospital Stay (HOSPITAL_COMMUNITY)
Admission: AD | Admit: 2023-02-12 | Discharge: 2023-02-17 | DRG: 885 | Disposition: A | Discharge status: Home / Self Care | Payer: MEDICAID | Source: Intra-hospital | Attending: Psychiatry | Admitting: Psychiatry

## 2023-02-12 DIAGNOSIS — Z813 Family history of other psychoactive substance abuse and dependence: Secondary | ICD-10-CM | POA: Diagnosis not present

## 2023-02-12 DIAGNOSIS — Z91018 Allergy to other foods: Secondary | ICD-10-CM

## 2023-02-12 DIAGNOSIS — T43212D Poisoning by selective serotonin and norepinephrine reuptake inhibitors, intentional self-harm, subsequent encounter: Secondary | ICD-10-CM

## 2023-02-12 DIAGNOSIS — Z832 Family history of diseases of the blood and blood-forming organs and certain disorders involving the immune mechanism: Secondary | ICD-10-CM | POA: Diagnosis not present

## 2023-02-12 DIAGNOSIS — Z7984 Long term (current) use of oral hypoglycemic drugs: Secondary | ICD-10-CM | POA: Diagnosis not present

## 2023-02-12 DIAGNOSIS — F1420 Cocaine dependence, uncomplicated: Secondary | ICD-10-CM | POA: Diagnosis present

## 2023-02-12 DIAGNOSIS — Z823 Family history of stroke: Secondary | ICD-10-CM

## 2023-02-12 DIAGNOSIS — Z833 Family history of diabetes mellitus: Secondary | ICD-10-CM | POA: Diagnosis not present

## 2023-02-12 DIAGNOSIS — Z555 Less than a high school diploma: Secondary | ICD-10-CM | POA: Diagnosis not present

## 2023-02-12 DIAGNOSIS — Z811 Family history of alcohol abuse and dependence: Secondary | ICD-10-CM

## 2023-02-12 DIAGNOSIS — F10129 Alcohol abuse with intoxication, unspecified: Secondary | ICD-10-CM | POA: Diagnosis present

## 2023-02-12 DIAGNOSIS — G4700 Insomnia, unspecified: Secondary | ICD-10-CM | POA: Diagnosis present

## 2023-02-12 DIAGNOSIS — F1520 Other stimulant dependence, uncomplicated: Secondary | ICD-10-CM | POA: Insufficient documentation

## 2023-02-12 DIAGNOSIS — F1410 Cocaine abuse, uncomplicated: Secondary | ICD-10-CM | POA: Insufficient documentation

## 2023-02-12 DIAGNOSIS — N390 Urinary tract infection, site not specified: Secondary | ICD-10-CM | POA: Insufficient documentation

## 2023-02-12 DIAGNOSIS — Z5986 Financial insecurity: Secondary | ICD-10-CM

## 2023-02-12 DIAGNOSIS — Z1152 Encounter for screening for COVID-19: Secondary | ICD-10-CM | POA: Diagnosis not present

## 2023-02-12 DIAGNOSIS — F1090 Alcohol use, unspecified, uncomplicated: Secondary | ICD-10-CM | POA: Insufficient documentation

## 2023-02-12 DIAGNOSIS — F17210 Nicotine dependence, cigarettes, uncomplicated: Secondary | ICD-10-CM | POA: Diagnosis present

## 2023-02-12 DIAGNOSIS — N3001 Acute cystitis with hematuria: Secondary | ICD-10-CM | POA: Diagnosis present

## 2023-02-12 DIAGNOSIS — F1220 Cannabis dependence, uncomplicated: Secondary | ICD-10-CM | POA: Diagnosis present

## 2023-02-12 DIAGNOSIS — Z818 Family history of other mental and behavioral disorders: Secondary | ICD-10-CM | POA: Diagnosis not present

## 2023-02-12 DIAGNOSIS — F332 Major depressive disorder, recurrent severe without psychotic features: Secondary | ICD-10-CM

## 2023-02-12 DIAGNOSIS — E119 Type 2 diabetes mellitus without complications: Secondary | ICD-10-CM

## 2023-02-12 DIAGNOSIS — Z825 Family history of asthma and other chronic lower respiratory diseases: Secondary | ICD-10-CM

## 2023-02-12 DIAGNOSIS — F411 Generalized anxiety disorder: Secondary | ICD-10-CM | POA: Diagnosis present

## 2023-02-12 DIAGNOSIS — Z91199 Patient's noncompliance with other medical treatment and regimen due to unspecified reason: Secondary | ICD-10-CM | POA: Diagnosis not present

## 2023-02-12 DIAGNOSIS — F319 Bipolar disorder, unspecified: Principal | ICD-10-CM | POA: Diagnosis present

## 2023-02-12 DIAGNOSIS — Z888 Allergy status to other drugs, medicaments and biological substances status: Secondary | ICD-10-CM

## 2023-02-12 DIAGNOSIS — F17200 Nicotine dependence, unspecified, uncomplicated: Secondary | ICD-10-CM | POA: Diagnosis present

## 2023-02-12 MED ORDER — LORAZEPAM 1 MG PO TABS: 1.0000 mg | ORAL_TABLET | ORAL | Status: DC | PRN

## 2023-02-12 MED ORDER — LORAZEPAM 0.5 MG PO TABS: 0.5000 mg | ORAL_TABLET | Freq: Three times a day (TID) | ORAL | Status: DC | PRN

## 2023-02-12 MED ORDER — OLANZAPINE 10 MG PO TBDP: 10.0000 mg | ORAL_TABLET | Freq: Three times a day (TID) | ORAL | Status: DC | PRN

## 2023-02-12 MED ORDER — ADULT MULTIVITAMIN W/MINERALS CH
1.0000 | ORAL_TABLET | Freq: Every day | ORAL | Status: DC
  Administered 2023-02-13 – 2023-02-17 (×5): 1 via ORAL
  Filled 2023-02-12 (×8): qty 1

## 2023-02-12 MED ORDER — ALUM & MAG HYDROXIDE-SIMETH 200-200-20 MG/5ML PO SUSP: 30.0000 mL | ORAL | Status: DC | PRN

## 2023-02-12 MED ORDER — METFORMIN HCL 500 MG PO TABS
1000.0000 mg | ORAL_TABLET | Freq: Two times a day (BID) | ORAL | Status: DC
  Administered 2023-02-12 – 2023-02-17 (×10): 1000 mg via ORAL
  Filled 2023-02-12 (×18): qty 2

## 2023-02-12 MED ORDER — ACETAMINOPHEN 325 MG PO TABS
650.0000 mg | ORAL_TABLET | Freq: Four times a day (QID) | ORAL | Status: DC | PRN
  Administered 2023-02-13 – 2023-02-16 (×9): 650 mg via ORAL
  Filled 2023-02-12 (×10): qty 2

## 2023-02-12 MED ORDER — THIAMINE MONONITRATE 100 MG PO TABS
100.0000 mg | ORAL_TABLET | Freq: Every day | ORAL | Status: DC
  Filled 2023-02-12: qty 1

## 2023-02-12 MED ORDER — THIAMINE HCL 100 MG/ML IJ SOLN: 100.0000 mg | Freq: Every day | INTRAMUSCULAR | Status: DC

## 2023-02-12 MED ORDER — MAGNESIUM HYDROXIDE 400 MG/5ML PO SUSP: 30.0000 mL | Freq: Every day | ORAL | Status: DC | PRN

## 2023-02-12 MED ORDER — LORAZEPAM 2 MG/ML IJ SOLN: 1.0000 mg | INTRAMUSCULAR | Status: DC | PRN

## 2023-02-12 MED ORDER — HALOPERIDOL 5 MG PO TABS: 5.0000 mg | ORAL_TABLET | Freq: Three times a day (TID) | ORAL | Status: DC | PRN

## 2023-02-12 MED ORDER — FOLIC ACID 1 MG PO TABS
1.0000 mg | ORAL_TABLET | Freq: Every day | ORAL | Status: DC
  Filled 2023-02-12: qty 1

## 2023-02-12 MED ORDER — DIPHENHYDRAMINE HCL 25 MG PO CAPS: 50.0000 mg | ORAL_CAPSULE | Freq: Three times a day (TID) | ORAL | Status: DC | PRN

## 2023-02-12 MED ORDER — MENTHOL 3 MG MT LOZG
1.0000 | LOZENGE | OROMUCOSAL | Status: DC | PRN
  Administered 2023-02-13: 3 mg via ORAL
  Filled 2023-02-12: qty 9

## 2023-02-12 MED ORDER — ADULT MULTIVITAMIN W/MINERALS CH
1.0000 | ORAL_TABLET | Freq: Every day | ORAL | Status: DC
  Filled 2023-02-12: qty 1

## 2023-02-12 MED ORDER — FOLIC ACID 1 MG PO TABS
1.0000 mg | ORAL_TABLET | Freq: Every day | ORAL | Status: DC
  Administered 2023-02-13 – 2023-02-17 (×5): 1 mg via ORAL
  Filled 2023-02-12 (×8): qty 1

## 2023-02-12 MED ORDER — VITAMIN B-1 100 MG PO TABS
100.0000 mg | ORAL_TABLET | Freq: Every day | ORAL | Status: DC
  Administered 2023-02-13 – 2023-02-17 (×5): 100 mg via ORAL
  Filled 2023-02-12 (×8): qty 1

## 2023-02-12 MED ORDER — ALBUTEROL SULFATE HFA 108 (90 BASE) MCG/ACT IN AERS: 2.0000 | INHALATION_SPRAY | Freq: Four times a day (QID) | RESPIRATORY_TRACT | Status: DC | PRN

## 2023-02-12 MED ORDER — THIAMINE HCL 100 MG/ML IJ SOLN
100.0000 mg | Freq: Every day | INTRAMUSCULAR | Status: DC
  Filled 2023-02-12: qty 2

## 2023-02-12 NOTE — ED Notes (Signed)
Called [facility] to transport patient to [facility]. 

## 2023-02-12 NOTE — Progress Notes (Addendum)
Patient ID: Grace Montgomery, female   DOB: 1990/10/27, 33 y.o.   MRN: [id number]  Grace Montgomery is a 32 year old female IVC'D from [facility] on 02/12/23 following a suicide attempt by overdosing on 6 duloxetine 20mg  pills and drinking alcohol. Per chart, patient called 911 following OD attempt and said "I will be dead by 03-07-23". Patient reportedly obtained weapons to hurt herself. Pt reports that she lives alone in a room that she rents but is likely going to lose her room due to financial difficulties. Pt denied SI/HI/AVH on admission. Pt was irritable, flat, and guarded during admission and did not wish to share much information with staff. Patient's BA was 325 when admitted to the ED. During admission patient reports that she only drinks once a week and "doesn't know" how many drinks she generally has. Pt denies having ant alcohol detox symptoms at this time and her vital signs are WDL. Pt reports occasional tobacco use. Pt denies other drug use. Pt reports she has no support system right now stating "I am my support system". Pt oriented to the unit and provided with a lunch tray.

## 2023-02-12 NOTE — Progress Notes (Signed)
[facility] LCSW Progress Note   02/12/2023    11:46 AM  Grace Montgomery   [id number]   Type of Contact and Topic:  Psychiatric Bed Placement   Pt accepted to [facility] 307-2     Patient meets inpatient criteria per Dr. Gilman Schmidt   The attending provider will be Dr. Sherron Flemings   Call report to [phone]    [name], RN @ [facility] ED notified.     Pt scheduled  to arrive at [facility] TODAY. The bed is currently available.    [name], MSW, LCSW-A  11:47 AM 02/12/2023
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
## 2023-02-12 NOTE — Plan of Care (Signed)
  Problem: Education: Goal: Knowledge of [facility] Education information/materials will improve Outcome: Progressing Goal: Verbalization of understanding the information provided will improve Outcome: Progressing   

## 2023-02-12 NOTE — Tx Team (Signed)
Initial Treatment Plan 02/12/2023 2:58 PM Grace Montgomery [id number]    PATIENT STRESSORS: Financial difficulties   Substance abuse     PATIENT STRENGTHS: Capable of independent living  General fund of knowledge    PATIENT IDENTIFIED PROBLEMS:   "SI attempt by OD on 6 duloxetine 20mg  pills and drinking alcohol"    "Depression"    "Financial stress"           DISCHARGE CRITERIA:  Improved stabilization in mood, thinking, and/or behavior  PRELIMINARY DISCHARGE PLAN: Outpatient therapy  PATIENT/FAMILY INVOLVEMENT: This treatment plan has been presented to and reviewed with the patient, Grace Montgomery.The patient has been given the opportunity to ask questions and make suggestions.  Earma Reading Quint Chestnut, RN 02/12/2023, 2:58 PM

## 2023-02-12 NOTE — ED Provider Notes (Signed)
Emergency Medicine Observation Re-evaluation Note  Grace Montgomery is a 32 y.o. female, seen on rounds today.  Pt initially presented to the ED for complaints of Suicidal Currently, the patient is resting/asleep.  Physical Exam  BP 91/79 (BP Location: Right Arm)   Pulse 71   Temp 98.4 F (36.9 C) (Oral)   Resp 18   SpO2 100%  Physical Exam General: no distress Cardiac: regular rate Lungs: no resp distress Psych: calm  ED Course / MDM  EKG:EKG Interpretation Date/Time:  Saturday February 12 2023 05:20:54 EST Ventricular Rate:  63 PR Interval:  92 QRS Duration:  86 QT Interval:  436 QTC Calculation: 446 R Axis:   68  Text Interpretation: Sinus rhythm with sinus arrhythmia with short PR Otherwise normal ECG No significant change was found Confirmed by Zadie Rhine ([id number]) on 02/12/2023 5:35:41 AM  I have reviewed the labs performed to date as well as medications administered while in observation.  Recent changes in the last 24 hours include.  Plan   32 year old female with a history of major depressive disorder, recurrent, severe, without psychotic features, bipolar disorder, substance induced mood disorder, alcohol and stimulant use disorders, type 2 diabetes mellitus who presents to the ED on an IVC after reportedly attempting suicide.  Current plan is to hold patient for psychiatric stabilization    Derwood Kaplan, MD 02/12/23 [phone]

## 2023-02-12 NOTE — ED Notes (Signed)
Report given to [facility], nurse requesting that we do not send patient until 12-1pm today, charge nurse made aware

## 2023-02-13 DIAGNOSIS — F1410 Cocaine abuse, uncomplicated: Secondary | ICD-10-CM | POA: Insufficient documentation

## 2023-02-13 DIAGNOSIS — F1090 Alcohol use, unspecified, uncomplicated: Secondary | ICD-10-CM | POA: Insufficient documentation

## 2023-02-13 DIAGNOSIS — G4700 Insomnia, unspecified: Secondary | ICD-10-CM | POA: Diagnosis present

## 2023-02-13 DIAGNOSIS — F1420 Cocaine dependence, uncomplicated: Secondary | ICD-10-CM | POA: Diagnosis present

## 2023-02-13 DIAGNOSIS — F17200 Nicotine dependence, unspecified, uncomplicated: Secondary | ICD-10-CM | POA: Diagnosis present

## 2023-02-13 DIAGNOSIS — F411 Generalized anxiety disorder: Secondary | ICD-10-CM | POA: Diagnosis present

## 2023-02-13 DIAGNOSIS — F1220 Cannabis dependence, uncomplicated: Secondary | ICD-10-CM

## 2023-02-13 DIAGNOSIS — F1520 Other stimulant dependence, uncomplicated: Secondary | ICD-10-CM | POA: Insufficient documentation

## 2023-02-13 HISTORY — DX: Cannabis dependence, uncomplicated: F12.20

## 2023-02-13 LAB — URINALYSIS, ROUTINE W REFLEX MICROSCOPIC
Bacteria, UA: NONE SEEN
Bilirubin Urine: NEGATIVE
Glucose, UA: >=500 mg/dL — AB
Ketones, ur: NEGATIVE mg/dL
Nitrite: NEGATIVE
Protein, ur: NEGATIVE mg/dL
Specific Gravity, Urine: 1.023 (ref 1.005–1.030)
WBC, UA: >50 WBC/hpf (ref 0–5)
pH: 5 (ref 5.0–8.0)

## 2023-02-13 LAB — RESPIRATORY PANEL BY PCR

## 2023-02-13 LAB — SARS CORONAVIRUS 2 BY RT PCR: SARS Coronavirus 2 by RT PCR: NEGATIVE

## 2023-02-13 LAB — GLUCOSE, CAPILLARY
Glucose-Capillary: 158 mg/dL — ABNORMAL HIGH (ref 70–99)
Glucose-Capillary: 186 mg/dL — ABNORMAL HIGH (ref 70–99)

## 2023-02-13 MED ORDER — NICOTINE POLACRILEX 2 MG MT GUM
2.0000 mg | CHEWING_GUM | OROMUCOSAL | Status: DC | PRN
Start: 2023-02-13 — End: 2023-02-17

## 2023-02-13 MED ORDER — ONDANSETRON 4 MG PO TBDP: 4.0000 mg | ORAL_TABLET | Freq: Four times a day (QID) | ORAL | Status: AC | PRN

## 2023-02-13 MED ORDER — LORAZEPAM 1 MG PO TABS
1.0000 mg | ORAL_TABLET | Freq: Four times a day (QID) | ORAL | Status: AC
  Administered 2023-02-13 – 2023-02-14 (×4): 1 mg via ORAL
  Filled 2023-02-13 (×4): qty 1

## 2023-02-13 MED ORDER — SERTRALINE HCL 25 MG PO TABS
25.0000 mg | ORAL_TABLET | Freq: Every day | ORAL | Status: AC
  Administered 2023-02-13: 25 mg via ORAL
  Filled 2023-02-13 (×2): qty 1

## 2023-02-13 MED ORDER — SERTRALINE HCL 50 MG PO TABS
50.0000 mg | ORAL_TABLET | Freq: Every day | ORAL | Status: DC
  Administered 2023-02-14 – 2023-02-15 (×2): 50 mg via ORAL
  Filled 2023-02-13 (×5): qty 1

## 2023-02-13 MED ORDER — HYDROXYZINE HCL 25 MG PO TABS
25.0000 mg | ORAL_TABLET | Freq: Three times a day (TID) | ORAL | Status: DC | PRN
  Administered 2023-02-15 – 2023-02-16 (×3): 25 mg via ORAL
  Filled 2023-02-13 (×3): qty 1

## 2023-02-13 MED ORDER — LORAZEPAM 1 MG PO TABS
1.0000 mg | ORAL_TABLET | Freq: Two times a day (BID) | ORAL | Status: AC
  Administered 2023-02-15 – 2023-02-16 (×2): 1 mg via ORAL
  Filled 2023-02-13 (×2): qty 1

## 2023-02-13 MED ORDER — LORAZEPAM 1 MG PO TABS
1.0000 mg | ORAL_TABLET | Freq: Four times a day (QID) | ORAL | Status: AC | PRN
Start: 2023-02-13 — End: 2023-02-16

## 2023-02-13 MED ORDER — INSULIN ASPART 100 UNIT/ML IJ SOLN
0.0000 [IU] | Freq: Three times a day (TID) | INTRAMUSCULAR | Status: DC
  Administered 2023-02-15 – 2023-02-16 (×4): 1 [IU] via SUBCUTANEOUS
  Administered 2023-02-17: 2 [IU] via SUBCUTANEOUS
  Filled 2023-02-13: qty 0.06

## 2023-02-13 MED ORDER — LORAZEPAM 1 MG PO TABS
1.0000 mg | ORAL_TABLET | Freq: Every day | ORAL | Status: AC
  Administered 2023-02-17: 1 mg via ORAL
  Filled 2023-02-13: qty 1

## 2023-02-13 MED ORDER — METRONIDAZOLE 500 MG PO TABS
500.0000 mg | ORAL_TABLET | Freq: Two times a day (BID) | ORAL | Status: DC
  Administered 2023-02-13 – 2023-02-15 (×4): 500 mg via ORAL
  Filled 2023-02-13 (×9): qty 1
  Filled 2023-02-13: qty 2
  Filled 2023-02-13 (×2): qty 1

## 2023-02-13 MED ORDER — LOPERAMIDE HCL 2 MG PO CAPS: 2.0000 mg | ORAL_CAPSULE | ORAL | Status: AC | PRN

## 2023-02-13 MED ORDER — MELATONIN 3 MG PO TABS
3.0000 mg | ORAL_TABLET | Freq: Every evening | ORAL | Status: DC | PRN
Start: 2023-02-13 — End: 2023-02-17
  Administered 2023-02-13 – 2023-02-16 (×4): 3 mg via ORAL
  Filled 2023-02-13 (×4): qty 1

## 2023-02-13 MED ORDER — LORAZEPAM 1 MG PO TABS
1.0000 mg | ORAL_TABLET | Freq: Three times a day (TID) | ORAL | Status: AC
  Administered 2023-02-14 – 2023-02-15 (×3): 1 mg via ORAL
  Filled 2023-02-13 (×3): qty 1

## 2023-02-13 MED ORDER — VITAMIN B-1 100 MG PO TABS: 100.0000 mg | ORAL_TABLET | Freq: Every day | ORAL | Status: DC

## 2023-02-13 NOTE — Progress Notes (Signed)
   02/13/23 1400  Psych Admission Type (Psych Patients Only)  Admission Status Involuntary  Psychosocial Assessment  Patient Complaints Other (Comment) (sore throat and pain during urinating)  Eye Contact Brief  Facial Expression Flat  Affect Appropriate to circumstance  Speech Soft  Interaction Minimal  Motor Activity Slow  Appearance/Hygiene Disheveled  Behavior Characteristics Appropriate to situation  Mood Other (Comment) (Patient resting in bed physically not feeling well)  Thought Process  Coherency WDL  Content WDL  Delusions None reported or observed  Perception WDL  Hallucination None reported or observed  Judgment Impaired  Confusion None  Danger to Self  Current suicidal ideation? Denies  Danger to Others  Danger to Others None reported or observed

## 2023-02-13 NOTE — Psychosocial Assessment (Signed)
CSW attempted to complete PSA at 10:25 pt reported "I'm not in the mood" Pt's nursed reported that pt was just swabbed for COVID test. CSW will attempt engage another day to complete.   Steffanie Dunn,  LCSWA

## 2023-02-13 NOTE — Progress Notes (Signed)
   02/12/23 1950  Psych Admission Type (Psych Patients Only)  Admission Status Involuntary  Psychosocial Assessment  Patient Complaints Other (Comment) (pt c/o sore throat)  Eye Contact Poor (pt was resting in bed)  Facial Expression Flat  Affect Appropriate to circumstance  Speech Soft  Interaction Isolative  Motor Activity Other (Comment) (pt in bed resting)  Appearance/Hygiene Disheveled  Behavior Characteristics Appropriate to situation  Mood Other (Comment) (pt was resting)  Thought Process  Coherency WDL  Content UTA (pt in bed resting not wanting to talk long with Clinical research associate)  Delusions UTA  Perception UTA  Hallucination UTA  Judgment UTA  Confusion UTA  Danger to Self  Current suicidal ideation? Denies

## 2023-02-13 NOTE — H&P (Addendum)
Psychiatric Admission Assessment Adult  Patient Identification: Grace Montgomery MRN:  [id number] Date of Evaluation:  02/13/2023 Chief Complaint:  MDD (major depressive disorder), recurrent episode, severe (HCC) [F33.2] Principal Diagnosis: MDD (major depressive disorder), recurrent episode, severe (HCC) Diagnosis:  Principal Problem:   MDD (major depressive disorder), recurrent episode, severe (HCC) Active Problems:   MDD (major depressive disorder), recurrent severe, without psychosis (HCC)   Type 2 diabetes mellitus without complication, without long-term current use of insulin (HCC)   GAD (generalized anxiety disorder)   Nicotine dependence   Delta-9-tetrahydrocannabinol (THC) dependence (HCC)   Cocaine use disorder (HCC)   Alcohol use disorder   Insomnia  CC: Suicide attempt via overdose  HPI: Grace Montgomery is a 32 y.o. female  with a past psychiatric history of MDD recurrent severe, alcohol use disorder, anxiety & insomnia who initially presented to [facility] on 02/11/2023 via law enforcement after attempting suicide via alcohol intoxication & Cymbalta OD. She was treated and stabilized, and [facility] for treatment and stabilization of her mental health crises. Pt has one prior psychiatric hospitalization at this [facility] on sept 2022. PMHx is significant for asthma and diabetes.   Review of symptoms & Assessment:  As per chart review, patient took 6 pills of Cymbalta 30 mg tablets and drank alcohol with it in a suicide attempt.  She states that she only drank in order to overdose with the pills, but this is deceptive, and she has chronic alcoholism as per chart review.  Mood during assessment is angry, dysphoric, patient talks to writer facing the wall, with back turned towards Clinical research associate.  She admits to overdosing on Cymbalta which was prescribed to her, with an intent to end her life due to feeling overwhelmed due to her financial stressors.  Patient reports that she  is being evicted from where she resides, states that she rents a room from someone, and there is an order for her to leave due to lack of payment.  She reports not having any support system, which is another stressor, Clinical research associate inquired about her four children, but she states that she has no interest in discussing their whereabouts or anything regarding them.  She is argumentative through entire assessment, but denies any prior suicide attempts.  Admits to multiple mental health related hospitalizations at other facilities in the past, denies any suicide attempts in the past, even though she has previously admitted to several other attempts as per chart review.  During encounter, patient reports depressive symptoms of insomnia, anhedonia, decreased interest in doing things that make her happy, poor concentration, poor appetite, psychomotor retardation, decreased motivation levels, as well as feelings of hopelessness, helplessness, worthlessness.  Patient states that the symptoms have been ongoing for multiple years now, worsening over the past at least 1 month prior to this hospitalization.  She reports intrusive thoughts of wanting to die almost every day for the past 2 weeks prior to the overdose.  Patient reports anxiety, states that she worries a lot about the state of her finances, she does not have enough money to provide for herself, is currently being evicted, which was a trigger.  She reports always feeling on edge, with muscle tension, always too tired to do anything, but denies any panic type symptoms.  Patient denies OCD type symptoms denies symptoms significant for psychosis, PTSD, OCD, denies any history of emotional, physical, or sexual abuse in the past or recently.  Patient is however very defensive, and argumentative during encounter,  and continuously asked why questions being asked when she already answered the questions during nursing admission.  She denies any history of mania in the past or  recently.  Pt with flat affect and depressed mood, attention to personal hygiene and grooming is poor, eye contact is poor, speech is clear & coherent. Thought contents are organized and logical, and pt currently endorses passive SI, denies HI/AVH or paranoia. There is no evidence of delusional thoughts.    Mode of transport to Hospital: [facility] Department Current Outpatient (Home) Medication List: Cymbalta was prescribed, but patient states that she was not taking it, and only used it for the overdose. PRN medication prior to evaluation: Hydroxyzine, Benadryl, albuterol, Tylenol, milk of mag, Maalox.  ED course: Uneventful Collateral Information: Refuses to provide phone number collateral to be contacted, states that she has no one. POA/Legal Guardian: Patient is her own legal guardian  Past Psychiatric Hx: Previous Psych Diagnoses: MDD, alcohol use disorder, GAD Prior inpatient treatment: As listed above Current/prior outpatient treatment: Denies Prior rehab hx: Denies Psychotherapy hx: Denies History of suicide attempts:Denies History of homicide or aggression:Denies  Psychiatric medication history:During patient's admission to this hospital on 11/08/2020, she reported that she had previously been hospitalized at this hospital in August 2020.  Medications that she reported that she had tried at that time were Celexa, also tried trazodone, hydroxyzine.  Patient reports that she typically does not take any medications when she is discharged from hospitals. Psychiatric medication compliance history: Chronically noncompliant Neuromodulation history: Denies Current Psychiatrist: None Current therapist: None at this time  Substance Abuse Hx: Alcohol: Refuses to discuss her alcohol use, but as per chart review, patient has a history of chronic alcoholism, and during the admission in 2022 BAL was also highly elevated, requiring treatment with Ativan.  During this assessment, patient  states "I drink whenever I want, y'all don't have to worry about treating me for it. I don't drink every day."  BAL during the admission in 2022 was 298, for this admission BAL is 325. Tobacco: Smokes cigarettes, but refusing nicotine patch, educated that Nicorette gum has been placed in her list of medications, and that she can ask for her nurse to give it to her as needed. Illicit drugs: Denies but as per chart review she is positive for cocaine and THC.  Declines to discuss her substance abuse with Clinical research associate. Rx drug abuse: Denies Rehab hx: Denies  Past Medical History: Medical Diagnoses: Diabetes, takes metformin.  Patient has a history of bacterial vaginosis, was treated for it with Flagyl 500 mg twice daily for 7 days during the last admission.  She is complaining of similar symptoms for this admission, we have sent urine for testing for a urinary tract infection, but we will go ahead and start Flagyl 500 mg twice daily x 7 days, and will stop if urinalysis returns negative. Home Rx: Unsure what her medications are at this time, but as per chart review, discharge during the last admission on metformin 1000 mg twice daily, Jardiance 25 mg daily. Prior Hosp: As listed above Prior Surgeries/Trauma: Denies Head trauma, LOC, concussions, seizures: Denies Allergies: Denies but as per chart review allergic to Monistat, which causes swelling, chocolate, ibuprofen not specified, and bananas cause itching. LMP: Unable to recall Contraception: None PCP: Denies having 1 at this time  Family History: Refuses to discuss family history  Social History: Reports a 12th grade education, but refuses to discuss which she grew up, and family medical and mental health  history. Abuse: Denies Marital Status: Single Sexual orientation: Heterosexual Children: 4, but refuses to discuss details.  As per chart review, the children reside with multiple family members, as patient is not able to take care of  them. Employment: None at this time Peer Group: None Housing: Being evicted from a room which she rents Finances: A stressor right now states that she is not working. Legal: Denies Military: Denies  Total Time spent with patient: 1.5 hours  Is the patient at risk to self? Yes.    Has the patient been a risk to self in the past 6 months? Yes.    Has the patient been a risk to self within the distant past? Yes.    Is the patient a risk to others? No.  Has the patient been a risk to others in the past 6 months? No.  Has the patient been a risk to others within the distant past? No.   [place] Scale:  Flowsheet Row [facility] (Current) [facility] from 02/11/2023 in [facility] ED from 01/18/2023 in [facility]  C-SSRS RISK CATEGORY High Risk High Risk No Risk        Tobacco Screening:  Social History   Tobacco Use  Smoking Status Never   Passive exposure: Never  Smokeless Tobacco Never    BH Tobacco Counseling     Are you interested in Tobacco Cessation Medications?  No, patient refused Counseled patient on smoking cessation:  Refused/Declined practical counseling Reason Tobacco Screening Not Completed: Patient Refused Screening       Social History:  Social History   Substance and Sexual Activity  Alcohol Use Yes   Comment: "every other weekend"     Social History   Substance and Sexual Activity  Drug Use Not Currently   Comment: History of Marijuana - a few ago from  February 16, 2019   Allergies:   Allergies  Allergen Reactions   Monistat [Tioconazole] Swelling and Other (See Comments)    Vaginal topical   Chocolate Itching and Other (See Comments)    Ears & throat itch   Ibuprofen Swelling and Other (See Comments)    Swelling of the feet   Banana Itching and Other (See Comments)    Throat itching   Lab  Results:  Results for orders placed or performed during the hospital encounter of 02/12/23 (from the past 48 hour(s))  SARS Coronavirus 2 by RT PCR (hospital order, performed in [facility] hospital lab) *cepheid single result test* Anterior Nasal Swab     Status: None   Collection Time: 02/13/23  9:39 AM   Specimen: Anterior Nasal Swab  Result Value Ref Range   SARS Coronavirus 2 by RT PCR NEGATIVE NEGATIVE    Comment: (NOTE) SARS-CoV-2 target nucleic acids are NOT DETECTED.  The SARS-CoV-2 RNA is generally detectable in upper and lower respiratory specimens during the acute phase of infection. The lowest concentration of SARS-CoV-2 viral copies this assay can detect is 250 copies / mL. A negative result does not preclude SARS-CoV-2 infection and should not be used as the sole basis for treatment or other patient management decisions.  A negative result may occur with improper specimen collection / handling, submission of specimen other than nasopharyngeal swab, presence of viral mutation(s) within the areas targeted by this assay, and inadequate number of viral copies (<250 copies / mL). A negative result must  be combined with clinical observations, patient history, and epidemiological information.  Fact Sheet for Patients:   RoadLapTop.co.za  Fact Sheet for Healthcare Providers: http://kim-miller.com/  This test is not yet approved or  cleared by the [place] FDA and has been authorized for detection and/or diagnosis of SARS-CoV-2 by FDA under an Emergency Use Authorization (EUA).  This EUA will remain in effect (meaning this test can be used) for the duration of the COVID-19 declaration under Section 564(b)(1) of the Act, 21 U.S.C. section 360bbb-3(b)(1), unless the authorization is terminated or revoked sooner.  Performed at [facility], [address], [place], [place] [address]     Blood Alcohol  level:  Lab Results  Component Value Date   ETH [facility]) 02/11/2023   ETH <10 04/18/2022    Metabolic Disorder Labs:  Lab Results  Component Value Date   HGBA1C 5.8 02/09/2023   MPG 125.5 01/11/2022   MPG 139.85 11/07/2020   No results found for: "PROLACTIN" Lab Results  Component Value Date   CHOL 124 11/08/2020   TRIG 72 11/08/2020   HDL 72 11/08/2020   CHOLHDL 1.7 11/08/2020   VLDL 14 11/08/2020   LDLCALC 38 11/08/2020   LDLCALC 35 09/21/2018    Current Medications: Current Facility-Administered Medications  Medication Dose Route Frequency Provider Last Rate Last Admin   acetaminophen (TYLENOL) tablet 650 mg  650 mg Oral Q6H PRN Chales Abrahams, NP   650 mg at 02/13/23 1438   albuterol (VENTOLIN HFA) 108 (90 Base) MCG/ACT inhaler 2 puff  2 puff Inhalation Q6H PRN Chales Abrahams, NP       alum & mag hydroxide-simeth (MAALOX/MYLANTA) 200-200-20 MG/5ML suspension 30 mL  30 mL Oral Q4H PRN Ophelia Shoulder E, NP       haloperidol (HALDOL) tablet 5 mg  5 mg Oral TID PRN Chales Abrahams, NP       And   diphenhydrAMINE (BENADRYL) capsule 50 mg  50 mg Oral TID PRN Chales Abrahams, NP       folic acid (FOLVITE) tablet 1 mg  1 mg Oral Daily Ophelia Shoulder E, NP   1 mg at 02/13/23 6962   hydrOXYzine (ATARAX) tablet 25 mg  25 mg Oral TID PRN Starleen Blue, NP       insulin aspart (novoLOG) injection 0-6 Units  0-6 Units Subcutaneous TID WC Harnoor Kohles, Harrold Donath, MD       loperamide (IMODIUM) capsule 2-4 mg  2-4 mg Oral PRN Starleen Blue, NP       LORazepam (ATIVAN) tablet 1 mg  1 mg Oral Q6H PRN Starleen Blue, NP       LORazepam (ATIVAN) tablet 1 mg  1 mg Oral QID Starleen Blue, NP   1 mg at 02/13/23 1406   Followed by   Melene Muller ON 02/14/2023] LORazepam (ATIVAN) tablet 1 mg  1 mg Oral TID Starleen Blue, NP       Followed by   Melene Muller ON 02/15/2023] LORazepam (ATIVAN) tablet 1 mg  1 mg Oral BID Starleen Blue, NP       Followed by   Melene Muller ON 02/17/2023] LORazepam (ATIVAN) tablet 1 mg   1 mg Oral Daily Nkwenti, Doris, NP       magnesium hydroxide (MILK OF MAGNESIA) suspension 30 mL  30 mL Oral Daily PRN Ophelia Shoulder E, NP       melatonin tablet 3 mg  3 mg Oral QHS PRN Starleen Blue, NP  menthol-cetylpyridinium (CEPACOL) lozenge 3 mg  1 lozenge Oral Q4H PRN Bobbitt, Shalon E, NP   3 mg at 02/13/23 6045   metFORMIN (GLUCOPHAGE) tablet 1,000 mg  1,000 mg Oral BID WC Ophelia Shoulder E, NP   1,000 mg at 02/13/23 0951   metroNIDAZOLE (FLAGYL) tablet 500 mg  500 mg Oral Q12H Nkwenti, Doris, NP       multivitamin with minerals tablet 1 tablet  1 tablet Oral Daily Ophelia Shoulder E, NP   1 tablet at 02/13/23 4098   nicotine polacrilex (NICORETTE) gum 2 mg  2 mg Oral PRN Starleen Blue, NP       ondansetron (ZOFRAN-ODT) disintegrating tablet 4 mg  4 mg Oral Q6H PRN Starleen Blue, NP       Melene Muller ON 02/14/2023] sertraline (ZOLOFT) tablet 50 mg  50 mg Oral Daily Nkwenti, Doris, NP       thiamine (Vitamin B-1) tablet 100 mg  100 mg Oral Daily Ophelia Shoulder E, NP   100 mg at 02/13/23 1191   PTA Medications: Medications Prior to Admission  Medication Sig Dispense Refill Last Dose   albuterol (VENTOLIN HFA) 108 (90 Base) MCG/ACT inhaler Inhale 2 puffs into the lungs every 6 (six) hours as needed for wheezing or shortness of breath.   Past Month   metFORMIN (GLUCOPHAGE) 1000 MG tablet Take 1 tablet (1,000 mg total) by mouth 2 (two) times daily with a meal for 180 doses. 90 tablet 1 Past Week   Multiple Vitamin (MULTIVITAMIN WITH MINERALS) TABS tablet Take 1 tablet by mouth daily.   Past Month   naproxen sodium (ALEVE) 220 MG tablet Take 440 mg by mouth 2 (two) times daily as needed (Pain).      Blood Glucose Monitoring Suppl (ONETOUCH VERIO) w/Device KIT Check blood sugar once daily 1 kit 0 unknown   glucose blood (ONETOUCH VERIO) test strip Check blood sugar once daily 100 strip 3 unknown   Lancet Devices (ONE TOUCH DELICA LANCING DEV) MISC Check blood sugar once daily 1 each 0 unknown    OneTouch Delica Lancets 33G MISC Check blood sugar once daily 100 each 3 unknown    Musculoskeletal: Strength & Muscle Tone: within normal limits Gait & Station: normal Patient leans: N/A  Psychiatric Specialty Exam:  Presentation  General Appearance:  Appropriate for Environment  Eye Contact: Fair  Speech: Clear and Coherent  Speech Volume: Normal  Handedness: Right   Mood and Affect  Mood: Dysphoric  Affect: Appropriate   Thought Process  Thought Processes: Coherent  Descriptions of Associations: Intact  Orientation: Full (Time, Place and Person)  Thought Content: Computation  History of Schizophrenia/Schizoaffective disorder:No data recorded Duration of Psychotic Symptoms: >2 months  Hallucinations: Hallucinations: None  Ideas of Reference: None  Suicidal Thoughts: Suicidal Thoughts: No  Homicidal Thoughts: Homicidal Thoughts: No   Sensorium  Memory: Immediate Fair; Recent Fair; Remote Fair  Judgment: Poor  Insight: Poor   Executive Functions  Concentration: Fair  Attention Span: Fair  Recall: Fair  Fund of Knowledge: Fair  Language: Good   Psychomotor Activity  Psychomotor Activity: Psychomotor Activity: Normal   Assets  Assets: Communication Skills   Sleep  Sleep:No data recorded   Physical Exam: Physical Exam Vitals reviewed.  Pulmonary:     Effort: Pulmonary effort is normal.  Neurological:     Mental Status: She is alert.     Motor: No weakness.     Gait: Gait normal.  Psychiatric:        Thought  Content: Thought content normal.        Judgment: Judgment normal.    Review of Systems  Gastrointestinal:  Positive for abdominal pain.  Psychiatric/Behavioral:  Positive for depression and substance abuse. Negative for hallucinations, memory loss and suicidal ideas. The patient is nervous/anxious and has insomnia.    Blood pressure 119/83, pulse 64, temperature 98.4 F (36.9 C), temperature  source Oral, resp. rate 18, height 5\' 3"  (1.6 m), weight 87.1 kg, SpO2 100%. Body mass index is 34.01 kg/m.  Treatment Plan Summary: Daily contact with patient to assess and evaluate symptoms and progress in treatment and Medication management  Diagnoses / Active Problems:  -MDD -Alcohol use disorder -Insomnia -DM -Tobacco use disorder -Cocaine use disorder -THC use disorder -GAD  PLAN: Safety and Monitoring:  --  INVOLUNTARY  admission to inpatient psychiatric unit for safety, stabilization and treatment  -- Daily contact with patient to assess and evaluate symptoms and progress in treatment  -- Patient's case to be discussed in multi-disciplinary team meeting  -- Observation Level : q15 minute checks  -- Vital signs:  q12 hours  -- Precautions: suicide, elopement, and assault  2. Psychiatric Diagnoses and Treatment:   -- The risks/benefits/side-effects/alternatives to this medication were discussed in detail with the patient and time was given for questions. The patient consents to medication trial.              -- Metabolic profile and  baseline EKG monitoring obtained BMI: 34.01 kg/m TSH: ordered and pending  Lipid Panel: ordered and pending HbgA1c: 6.0 QTc: 446 on 02/12/2023 Also ordered Vit D, respiratory panel is pending, but COVID is negative.  Ordered repeat BMP, as potassium is 3.4.  Ordering repeat CBC with platelets as platelets are slightly elevated.             -- Encouraged patient to participate in unit milieu and in scheduled group therapies   -- Short Term Goals: Ability to identify changes in lifestyle to reduce recurrence of condition will improve and Ability to verbalize feelings will improve  -- Long Term Goals: Improvement in symptoms so as ready for discharge Medications -Start Zoloft 25 mg today, followed by 50 mg daily on 12/9 for depressive symptoms and GAD -Start Ativan taper for alcohol detox -Continue hydroxyzine 3 times daily as needed for  anxiety  Medications for medical reasons -Continue metformin 1000 mg twice daily for DM -Start Flagyl 500 mg twice daily for possible UTI or bacterial vaginosis infection -Continue albuterol as needed for wheezing or shortness of breath   PRNS:  Tylenol 650 mg every 6 hours as needed Albuterol inhaler every 6 hours as needed Maalox Mylanta every 4 hours as needed Agitation protocol (Haldol, Benadryl, Ativan) Ativan 0.5 mg 3 times daily as needed for anxiety Luke magnesia daily as needed Cepacol lozenges every 4 hours as needed  Other labs reviewed on admission:  Labs obtained from 02/09/2023 A1c 6% CMP showing mild hypokalemia 3.4, otherwise unremarkable Ethanol level 325 Salicylate and acetaminophen levels negative for toxicity CBC showing modestly elevated platelets, otherwise unremarkable serum hCG negative   3. Medical Issues Being Addressed:   #Tobacco Use Disorder  Nicorette gum ordered Smoking cessation encouraged  #T2DM Metformin 1,00 mg BID  4. Discharge Planning:   -- Social work and case management to assist with discharge planning and identification of hospital follow-up needs prior to discharge  -- Estimated Discharge Date: 5-7 days  -- Discharge Concerns: Need to establish a safety plan; Medication compliance and  effectiveness  -- Discharge Goals: Return home with outpatient referrals for mental health follow-up including medication management/psychotherapy   I certify that inpatient services furnished can reasonably be expected to improve the patient's condition.   This note was created using a voice recognition software as a result there may be grammatical errors inadvertently enclosed that do not reflect the nature of this encounter. Every attempt is made to correct such errors.   Signed: Starleen Blue, NP 12/8/20243:37 PM   Attestation signed by Phineas Inches, MD at 02/14/2023  7:18 AM   Total Time Spent in Direct Patient Care:  I personally  spent 40 minutes on the unit in direct patient care. The direct patient care time included face-to-face time with the patient, reviewing the patient's chart, communicating with other professionals, and coordinating care. Greater than 50% of this time was spent in counseling or coordinating care with the patient regarding goals of hospitalization, psycho-education, and discharge planning needs.   I have independently evaluated the patient during a face-to-face assessment. I reviewed the patient's chart, and I participated in key portions of the service. I discussed the case with the APP, and I agree with the assessment and plan of care as documented in the APP's note , as addended by me or notated below:   I agree with the assessment, dx list, and plan.    Phineas Inches, MD Psychiatrist

## 2023-02-13 NOTE — [facility] Group Notes (Signed)
[facility] Group Notes:  (Nursing)  Date:  02/13/2023  Time:  1400  Type of Therapy:  Psychoeducational Skills  Participation Level:  Did Not Attend   Shela Nevin 02/13/2023, 5:00 PM

## 2023-02-13 NOTE — [facility] Group Notes (Signed)
Type of Therapy and Topic:  Group Therapy: Mindfulness  Participation Level:  Did Not Attend   Description of Group:   In this group, patients shared and discussed the importance of acknowledging the elements in their lives for which they are Mindfulness and how this can positively impact their mood.  The group discussed how bringing the positive elements of their lives to the forefront of their minds can help with recovery from any illness, physical or mental.  An exercise was done as a group in which a list was made of mindfulness items to encourage participants to consider other potential positives in their lives.  Therapeutic Goals: Patients will identify one or more item for which they are grateful in each of 6 categories:  people, experiences, things, places, skills, and other. Patients will discuss how it is possible to seek out gratitude in even bad situations. Patients will explore other possible items of gratitude that they could remember.   Summary of Patient Progress:  NA  Therapeutic Modalities:   Solution-Focused Therapy Activity

## 2023-02-13 NOTE — Plan of Care (Signed)
  Problem: Activity: Goal: Sleeping patterns will improve Outcome: Progressing   

## 2023-02-13 NOTE — [facility] Suicide Risk Assessment (Signed)
Suicide Risk Assessment  Admission Assessment    [facility] Admission Suicide Risk Assessment   Nursing information obtained from:  Patient Demographic factors:  Unemployed Current Mental Status:  Suicidal ideation indicated by patient Loss Factors:  Financial problems / change in socioeconomic status Historical Factors:  NA Risk Reduction Factors:  NA  Total Time spent with patient: 1.5 hours Principal Problem: MDD (major depressive disorder), recurrent episode, severe (HCC) Diagnosis:  Principal Problem:   MDD (major depressive disorder), recurrent episode, severe (HCC) Active Problems:   Type 2 diabetes mellitus without complication, without long-term current use of insulin (HCC)   GAD (generalized anxiety disorder)   Nicotine dependence   Delta-9-tetrahydrocannabinol (THC) dependence (HCC)   Cocaine use disorder (HCC)   Alcohol use disorder   Insomnia  Subjective Data: Suicide attempt via overdose on alcohol and Cymbalta  Continued Clinical Symptoms: Patient remains significantly depressed, continues to require inpatient hospitalization for treatment and stabilization of mental status prior to discharge.  Alcohol Use Disorder Identification Test Final Score (AUDIT): 5 The "Alcohol Use Disorders Identification Test", Guidelines for Use in Primary Care, Second Edition.  World Science writer [facility]). Score between 0-7:  no or low risk or alcohol related problems. Score between 8-15:  moderate risk of alcohol related problems. Score between 16-19:  high risk of alcohol related problems. Score 20 or above:  warrants further diagnostic evaluation for alcohol dependence and treatment.  CLINICAL FACTORS:   Depression:   Anhedonia Alcohol/Substance Abuse/Dependencies More than one psychiatric diagnosis Previous Psychiatric Diagnoses and Treatments  Musculoskeletal: Strength & Muscle Tone: within normal limits Gait & Station: normal Patient leans: N/A  Psychiatric Specialty  Exam:  Presentation  General Appearance:  Appropriate for Environment  Eye Contact: Fair  Speech: Clear and Coherent  Speech Volume: Normal  Handedness: Right   Mood and Affect  Mood: Dysphoric  Affect: Appropriate   Thought Process  Thought Processes: Coherent  Descriptions of Associations:Intact  Orientation:Full (Time, Place and Person)  Thought Content:Computation  History of Schizophrenia/Schizoaffective disorder:No data recorded Duration of Psychotic Symptoms:No data recorded Hallucinations:Hallucinations: None  Ideas of Reference:None  Suicidal Thoughts:Suicidal Thoughts: No  Homicidal Thoughts:Homicidal Thoughts: No   Sensorium  Memory: Immediate Fair; Recent Fair; Remote Fair  Judgment: Poor  Insight: Poor   Executive Functions  Concentration: Fair  Attention Span: Fair  Recall: Fair  Fund of Knowledge: Fair  Language: Good   Psychomotor Activity  Psychomotor Activity: Psychomotor Activity: Normal   Assets  Assets: Communication Skills   Sleep  Sleep:No data recorded   Physical Exam: Physical Exam Constitutional:      Appearance: Normal appearance.  Musculoskeletal:        General: Normal range of motion.     Cervical back: Normal range of motion.  Neurological:     General: No focal deficit present.     Mental Status: She is alert and oriented to person, place, and time.    Review of Systems  Psychiatric/Behavioral:  Positive for depression, substance abuse and suicidal ideas. Negative for hallucinations and memory loss. The patient is nervous/anxious and has insomnia.   All other systems reviewed and are negative.  Blood pressure 119/83, pulse 64, temperature 98.4 F (36.9 C), temperature source Oral, resp. rate 18, height 5\' 3"  (1.6 m), weight 87.1 kg, SpO2 100%. Body mass index is 34.01 kg/m.   COGNITIVE FEATURES THAT CONTRIBUTE TO RISK:  None    SUICIDE RISK:   Severe:  Frequent, intense,  and enduring suicidal ideation, specific plan, no  subjective intent, but some objective markers of intent (i.e., choice of lethal method), the method is accessible, some limited preparatory behavior, evidence of impaired self-control, severe dysphoria/symptomatology, multiple risk factors present, and few if any protective factors, particularly a lack of social support.  PLAN OF CARE: See H & P  I certify that inpatient services furnished can reasonably be expected to improve the patient's condition.   Starleen Blue, NP 02/13/2023, 3:35 PM

## 2023-02-14 ENCOUNTER — Encounter (HOSPITAL_COMMUNITY): Payer: Self-pay | Admitting: Adult Health

## 2023-02-14 ENCOUNTER — Inpatient Hospital Stay (HOSPITAL_COMMUNITY): Payer: MEDICAID

## 2023-02-14 DIAGNOSIS — F332 Major depressive disorder, recurrent severe without psychotic features: Secondary | ICD-10-CM | POA: Diagnosis not present

## 2023-02-14 LAB — GLUCOSE, CAPILLARY
Glucose-Capillary: 148 mg/dL — ABNORMAL HIGH (ref 70–99)
Glucose-Capillary: 128 mg/dL — ABNORMAL HIGH (ref 70–99)

## 2023-02-14 LAB — BASIC METABOLIC PANEL
Anion gap: 8 (ref 5–15)
BUN: 7 mg/dL (ref 6–20)
CO2: 24 mmol/L (ref 22–32)
Calcium: 8.8 mg/dL — ABNORMAL LOW (ref 8.9–10.3)
Chloride: 102 mmol/L (ref 98–111)
Creatinine, Ser: 0.78 mg/dL (ref 0.44–1.00)
GFR, Estimated: >60 mL/min (ref >60)
Glucose, Bld: 161 mg/dL — ABNORMAL HIGH (ref 70–99)
Potassium: 3.3 mmol/L — ABNORMAL LOW (ref 3.5–5.1)
Sodium: 134 mmol/L — ABNORMAL LOW (ref 135–145)

## 2023-02-14 LAB — CBC WITH DIFFERENTIAL/PLATELET
Abs Immature Granulocytes: 0.1 10*3/uL — ABNORMAL HIGH (ref 0.00–0.07)
Basophils Absolute: 0 10*3/uL (ref 0.0–0.1)
Basophils Relative: 0 %
Eosinophils Absolute: 0.1 10*3/uL (ref 0.0–0.5)
Eosinophils Relative: 1 %
HCT: 32.3 % — ABNORMAL LOW (ref 36.0–46.0)
Hemoglobin: 10.5 g/dL — ABNORMAL LOW (ref 12.0–15.0)
Immature Granulocytes: 1 %
Lymphocytes Relative: 15 %
Lymphs Abs: 1.9 10*3/uL (ref 0.7–4.0)
MCH: 28.5 pg (ref 26.0–34.0)
MCHC: 32.5 g/dL (ref 30.0–36.0)
MCV: 87.8 fL (ref 80.0–100.0)
Monocytes Absolute: 0.9 10*3/uL (ref 0.1–1.0)
Monocytes Relative: 7 %
Neutro Abs: 9.9 10*3/uL — ABNORMAL HIGH (ref 1.7–7.7)
Neutrophils Relative %: 76 %
Platelets: 323 10*3/uL (ref 150–400)
RBC: 3.68 MIL/uL — ABNORMAL LOW (ref 3.87–5.11)
RDW: 17.4 % — ABNORMAL HIGH (ref 11.5–15.5)
WBC: 12.9 10*3/uL — ABNORMAL HIGH (ref 4.0–10.5)
nRBC: 0 % (ref 0.0–0.2)

## 2023-02-14 LAB — LIPID PANEL
Cholesterol: 141 mg/dL (ref 0–200)
HDL: 82 mg/dL (ref >40)
LDL Cholesterol: 42 mg/dL (ref 0–99)
Total CHOL/HDL Ratio: 1.7 {ratio}
Triglycerides: 87 mg/dL (ref <150)
VLDL: 17 mg/dL (ref 0–40)

## 2023-02-14 LAB — TSH: TSH: 4.21 u[IU]/mL (ref 0.350–4.500)

## 2023-02-14 LAB — VITAMIN D 25 HYDROXY (VIT D DEFICIENCY, FRACTURES): Vit D, 25-Hydroxy: 15.55 ng/mL — ABNORMAL LOW (ref 30–100)

## 2023-02-14 MED ORDER — ACETAMINOPHEN 325 MG PO TABS
650.0000 mg | ORAL_TABLET | Freq: Once | ORAL | Status: AC
  Administered 2023-02-14: 650 mg via ORAL
  Filled 2023-02-14: qty 2

## 2023-02-14 MED ORDER — ONDANSETRON HCL 4 MG/2ML IJ SOLN
4.0000 mg | Freq: Once | INTRAMUSCULAR | Status: AC
  Administered 2023-02-14: 4 mg via INTRAVENOUS
  Filled 2023-02-14: qty 2

## 2023-02-14 MED ORDER — CEPHALEXIN 250 MG PO CAPS
250.0000 mg | ORAL_CAPSULE | Freq: Four times a day (QID) | ORAL | Status: DC
  Administered 2023-02-15 – 2023-02-17 (×11): 250 mg via ORAL
  Filled 2023-02-14 (×19): qty 1

## 2023-02-14 MED ORDER — SODIUM CHLORIDE 0.9 % IV SOLN
1.0000 g | Freq: Once | INTRAVENOUS | Status: AC
  Administered 2023-02-14: 1 g via INTRAVENOUS
  Filled 2023-02-14: qty 10

## 2023-02-14 MED ORDER — IOHEXOL 300 MG/ML  SOLN
100.0000 mL | Freq: Once | INTRAMUSCULAR | Status: AC | PRN
  Administered 2023-02-14: 100 mL via INTRAVENOUS

## 2023-02-14 MED ORDER — PROPRANOLOL HCL 10 MG PO TABS
10.0000 mg | ORAL_TABLET | Freq: Two times a day (BID) | ORAL | Status: DC
  Administered 2023-02-14 – 2023-02-17 (×7): 10 mg via ORAL
  Filled 2023-02-14 (×13): qty 1

## 2023-02-14 MED ORDER — SODIUM CHLORIDE 0.9 % IV BOLUS: 1000.0000 mL | Freq: Once | INTRAVENOUS | Status: DC

## 2023-02-14 MED ORDER — MORPHINE SULFATE (PF) 2 MG/ML IV SOLN
2.0000 mg | Freq: Once | INTRAVENOUS | Status: AC
  Administered 2023-02-14: 2 mg via INTRAVENOUS
  Filled 2023-02-14: qty 1

## 2023-02-14 NOTE — ED Triage Notes (Signed)
Pt BIBA from [facility] as medical transfer. C/o L sided abd pain for 24 hrs. No aggravating factors.  AOx4

## 2023-02-14 NOTE — ED Provider Triage Note (Signed)
Emergency Medicine Provider Triage Evaluation Note  Grace Montgomery , a 32 y.o. female  was evaluated in triage.  Pt complains of left upper quadrant left-sided flank pain ongoing for 1 days.  Review of Systems  Positive: Decreased appetite, nausea Negative: Chest pain, shortness of breath  Physical Exam  BP (!) 105/56   Pulse (!) 114   Temp 99.8 F (37.7 C) (Oral)   Resp 18   Ht 5\' 3"  (1.6 m)   Wt 87.1 kg   SpO2 100%   BMI 34.01 kg/m  Gen:   Awake, no distress   Resp:  Normal effort  MSK:   Moves extremities without difficulty  Other:  Gastric, left upper quadrant abdominal pain  Medical Decision Making  Medically screening exam initiated at 4:15 PM.  Appropriate orders placed.  Grace Montgomery was informed that the remainder of the evaluation will be completed by another provider, this initial triage assessment does not replace that evaluation, and the importance of remaining in the ED until their evaluation is complete.  Patient reports she is here to be seen about abdominal pain and left upper quadrant.  She also reports she is having spotting.  Patient reports has been eating and drinking.  Denies any fevers, chills, URI type symptoms or cough.   Grace Knudsen, PA-C 02/14/23 1616

## 2023-02-14 NOTE — ED Provider Notes (Signed)
[facility] EMERGENCY DEPARTMENT AT [facility] Provider Note   CSN: [id number] Arrival date & time: 02/14/23  1529     History {Add pertinent medical, surgical, social history, OB history to HPI:1} Chief Complaint  Patient presents with   Abdominal Pain    Grace Montgomery is a 32 y.o. female.   Abdominal Pain      Home Medications Prior to Admission medications   Medication Sig Start Date End Date Taking? Authorizing Provider  albuterol (VENTOLIN HFA) 108 (90 Base) MCG/ACT inhaler Inhale 2 puffs into the lungs every 6 (six) hours as needed for wheezing or shortness of breath.   Yes [provider]  metFORMIN (GLUCOPHAGE) 1000 MG tablet Take 1 tablet (1,000 mg total) by mouth 2 (two) times daily with a meal for 180 doses. 02/11/23 05/12/23 Yes McDiarmid, Leighton Roach, MD  Multiple Vitamin (MULTIVITAMIN WITH MINERALS) TABS tablet Take 1 tablet by mouth daily.   Yes [provider]  naproxen sodium (ALEVE) 220 MG tablet Take 440 mg by mouth 2 (two) times daily as needed (Pain).   Yes [provider]  Blood Glucose Monitoring Suppl (ONETOUCH VERIO) w/Device KIT Check blood sugar once daily 02/09/23   Celine Mans, MD  glucose blood [facility] VERIO) test strip Check blood sugar once daily 02/11/23   McDiarmid, Leighton Roach, MD  Lancet Devices (ONE TOUCH DELICA LANCING DEV) MISC Check blood sugar once daily 02/09/23   Celine Mans, MD  OneTouch Delica Lancets 33G MISC Check blood sugar once daily 02/11/23   McDiarmid, Leighton Roach, MD      Allergies    Monistat [tioconazole], Chocolate, Ibuprofen, and Banana    Review of Systems   Review of Systems  Gastrointestinal:  Positive for abdominal pain.    Physical Exam Updated Vital Signs BP (!) 105/56   Pulse (!) 114   Temp 99.8 F (37.7 C) (Oral)   Resp 18   Ht 5\' 3"  (1.6 m)   Wt 87.1 kg   SpO2 100%   BMI 34.01 kg/m  Physical Exam  ED Results / Procedures / Treatments   Labs (all labs ordered  are listed, but only abnormal results are displayed) Labs Reviewed  URINALYSIS, ROUTINE W REFLEX MICROSCOPIC - Abnormal; Notable for the following components:      Result Value   APPearance HAZY (*)    Glucose, UA >=500 (*)    Hgb urine dipstick LARGE (*)    Leukocytes,Ua LARGE (*)    All other components within normal limits  GLUCOSE, CAPILLARY - Abnormal; Notable for the following components:   Glucose-Capillary 158 (*)    All other components within normal limits  BASIC METABOLIC PANEL - Abnormal; Notable for the following components:   Sodium 134 (*)    Potassium 3.3 (*)    Glucose, Bld 161 (*)    Calcium 8.8 (*)    All other components within normal limits  CBC WITH DIFFERENTIAL/PLATELET - Abnormal; Notable for the following components:   WBC 12.9 (*)    RBC 3.68 (*)    Hemoglobin 10.5 (*)    HCT 32.3 (*)    RDW 17.4 (*)    Neutro Abs 9.9 (*)    Abs Immature Granulocytes 0.10 (*)    All other components within normal limits  GLUCOSE, CAPILLARY - Abnormal; Notable for the following components:   Glucose-Capillary 186 (*)    All other components within normal limits  GLUCOSE, CAPILLARY - Abnormal; Notable for the following components:  Glucose-Capillary 148 (*)    All other components within normal limits  RESPIRATORY PANEL BY PCR  SARS CORONAVIRUS 2 BY RT PCR  LIPID PANEL  TSH  VITAMIN D 25 HYDROXY (VIT D DEFICIENCY, FRACTURES)  LIPASE, BLOOD    EKG None  Radiology No results found.  Procedures Procedures  {Document cardiac monitor, telemetry assessment procedure when appropriate:1}  Medications Ordered in ED Medications  folic acid (FOLVITE) tablet 1 mg (1 mg Oral Given 02/14/23 0830)  multivitamin with minerals tablet 1 tablet (1 tablet Oral Given 02/14/23 0830)  thiamine (VITAMIN B1) tablet 100 mg (100 mg Oral Given 02/14/23 0830)  albuterol (VENTOLIN HFA) 108 (90 Base) MCG/ACT inhaler 2 puff (has no administration in time range)  metFORMIN (GLUCOPHAGE)  tablet 1,000 mg (1,000 mg Oral Given 02/14/23 0830)  acetaminophen (TYLENOL) tablet 650 mg (650 mg Oral Given 02/14/23 1412)  alum & mag hydroxide-simeth (MAALOX/MYLANTA) 200-200-20 MG/5ML suspension 30 mL (has no administration in time range)  magnesium hydroxide (MILK OF MAGNESIA) suspension 30 mL (has no administration in time range)  haloperidol (HALDOL) tablet 5 mg (has no administration in time range)    And  diphenhydrAMINE (BENADRYL) capsule 50 mg (has no administration in time range)  menthol-cetylpyridinium (CEPACOL) lozenge 3 mg (3 mg Oral Given 02/13/23 0952)  insulin aspart (novoLOG) injection 0-6 Units ( Subcutaneous Not Given 02/14/23 1223)  LORazepam (ATIVAN) tablet 1 mg (has no administration in time range)  loperamide (IMODIUM) capsule 2-4 mg (has no administration in time range)  ondansetron (ZOFRAN-ODT) disintegrating tablet 4 mg (has no administration in time range)  LORazepam (ATIVAN) tablet 1 mg (1 mg Oral Given 02/14/23 0830)    Followed by  LORazepam (ATIVAN) tablet 1 mg (1 mg Oral Given 02/14/23 1255)    Followed by  LORazepam (ATIVAN) tablet 1 mg (has no administration in time range)    Followed by  LORazepam (ATIVAN) tablet 1 mg (has no administration in time range)  hydrOXYzine (ATARAX) tablet 25 mg (has no administration in time range)  melatonin tablet 3 mg (3 mg Oral Given 02/13/23 2051)  sertraline (ZOLOFT) tablet 25 mg (25 mg Oral Given 02/13/23 1406)    Followed by  sertraline (ZOLOFT) tablet 50 mg (50 mg Oral Given 02/14/23 0830)  nicotine polacrilex (NICORETTE) gum 2 mg (has no administration in time range)  metroNIDAZOLE (FLAGYL) tablet 500 mg (500 mg Oral Given 02/14/23 0830)  propranolol (INDERAL) tablet 10 mg (10 mg Oral Given 02/14/23 1413)  sodium chloride 0.9 % bolus 1,000 mL (has no administration in time range)  iohexol (OMNIPAQUE) 300 MG/ML solution 100 mL (100 mLs Intravenous Contrast Given 02/14/23 1858)    ED Course/ Medical Decision Making/ A&P    {   Click here for ABCD2, HEART and other calculatorsREFRESH Note before signing :1}                              Medical Decision Making Amount and/or Complexity of Data Reviewed Radiology: ordered.  Risk Prescription drug management.   ***  {Document critical care time when appropriate:1} {Document review of labs and clinical decision tools ie heart score, Chads2Vasc2 etc:1}  {Document your independent review of radiology images, and any outside records:1} {Document your discussion with family members, caretakers, and with consultants:1} {Document social determinants of health affecting pt's care:1} {Document your decision making why or why not admission, treatments were needed:1} Final Clinical Impression(s) / ED Diagnoses Final diagnoses:  Acute  cystitis with hematuria    Rx / DC Orders ED Discharge Orders     None

## 2023-02-14 NOTE — Progress Notes (Signed)
   02/13/23 2055  Psych Admission Type (Psych Patients Only)  Admission Status Involuntary  Psychosocial Assessment  Patient Complaints Other (Comment) (pt c/o feeling bad, sore throat and pain when urinating)  Eye Contact Poor  Facial Expression Flat;Pained  Affect Appropriate to circumstance  Speech Soft  Interaction Isolative;Minimal  Motor Activity Slow  Appearance/Hygiene Disheveled  Behavior Characteristics Agitated  Mood Other (Comment) (pt repeorts she doesn't feel good, sore throat and pain when urinating)  Thought Process  Coherency WDL  Content WDL  Delusions None reported or observed  Perception WDL  Hallucination None reported or observed  Judgment Poor  Confusion None  Danger to Self  Current suicidal ideation? Denies  Danger to Others  Danger to Others None reported or observed
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
## 2023-02-14 NOTE — Plan of Care (Signed)
  Problem: Activity: Goal: Sleeping patterns will improve Outcome: Progressing   Problem: Coping: Goal: Ability to verbalize frustrations and anger appropriately will improve Outcome: Progressing Goal: Ability to demonstrate self-control will improve Outcome: Progressing   

## 2023-02-14 NOTE — Progress Notes (Signed)
Pt refused lab draws this am, states stomach hurting, and doesn't feel well

## 2023-02-14 NOTE — Group Note (Signed)
Date:  02/14/2023 Time:  12:10 AM  Group Topic/Focus:  Wrap-Up Group:   The focus of this group is to help patients review their daily goal of treatment and discuss progress on daily workbooks.    Additional Comments:  Pt was encouraged, but opted out of coming to wrap up group this evening.   Chrisandra Netters 02/14/2023, 12:10 AM

## 2023-02-14 NOTE — Progress Notes (Signed)
Patient sent to [facility] via non emergent EMS for abdominal pain and elevated heart rate. Patient went willingly with MHT to monitor.
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
## 2023-02-14 NOTE — Group Note (Signed)
Recreation Therapy Group Note   Group Topic:Communication  Group Date: 02/14/2023 Start Time: 0940 End Time: 1000 Facilitators: Jaylan Hinojosa-McCall, LRT,CTRS Location: [address]   Group Topic:Communication  Group Date: 02/14/2023 Start Time: 0940 End Time: 1000 Facilitators: Estiben Mizuno-McCall, LRT,CTRS Location: [address]     Group Topic: Communication, Problem Solving   Goal Area(s) Addresses:  Patient will effectively listen to complete activity.  Patient will identify communication skills used to make activity successful.  Patient will identify how skills used during activity can be used to reach post d/c goals.    Intervention: Building surveyor Activity - Geometric pattern cards, pencils, blank paper    Group Description: Geometric Drawings.  Three volunteers from the peer group will be shown an abstract picture with a particular arrangement of geometrical shapes.  Each round, one 'speaker' will describe the pattern, as accurately as possible without revealing the image to the group.  The remaining group members will listen and draw the picture to reflect how it is described to them. Patients with the role of 'listener' cannot ask clarifying questions but, may request that the speaker repeat a direction. Once the drawings are complete, the presenter will show the rest of the group the picture and compare how close each person came to drawing the picture. LRT will facilitate a post-activity discussion regarding effective communication and the importance of planning, listening, and asking for clarification in daily interactions with others.   Education: Environmental consultant, Active listening, Support systems, Discharge planning   Education Outcome: Acknowledges understanding/In group clarification offered/Needs additional education.    Affect/Mood: N/A   Participation Level: Did not attend    Clinical Observations/Individualized Feedback:       Plan: Continue to engage patient in RT group sessions 2-3x/week.   Damia Bobrowski-McCall, LRT,CTRS  02/14/2023 12:51 PM
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
## 2023-02-14 NOTE — ED Notes (Signed)
Transport called for pt to go back to [facility].

## 2023-02-15 DIAGNOSIS — N390 Urinary tract infection, site not specified: Secondary | ICD-10-CM

## 2023-02-15 DIAGNOSIS — F332 Major depressive disorder, recurrent severe without psychotic features: Secondary | ICD-10-CM | POA: Diagnosis not present

## 2023-02-15 HISTORY — DX: Urinary tract infection, site not specified: N39.0

## 2023-02-15 LAB — CBC WITH DIFFERENTIAL/PLATELET
Abs Immature Granulocytes: 0.04 10*3/uL (ref 0.00–0.07)
Basophils Absolute: 0 10*3/uL (ref 0.0–0.1)
Basophils Relative: 0 %
Eosinophils Absolute: 0.2 10*3/uL (ref 0.0–0.5)
Eosinophils Relative: 3 %
HCT: 33.5 % — ABNORMAL LOW (ref 36.0–46.0)
Hemoglobin: 10.8 g/dL — ABNORMAL LOW (ref 12.0–15.0)
Immature Granulocytes: 1 %
Lymphocytes Relative: 22 %
Lymphs Abs: 1.7 10*3/uL (ref 0.7–4.0)
MCH: 28.6 pg (ref 26.0–34.0)
MCHC: 32.2 g/dL (ref 30.0–36.0)
MCV: 88.6 fL (ref 80.0–100.0)
Monocytes Absolute: 0.5 10*3/uL (ref 0.1–1.0)
Monocytes Relative: 6 %
Neutro Abs: 5 10*3/uL (ref 1.7–7.7)
Neutrophils Relative %: 68 %
Platelets: 350 10*3/uL (ref 150–400)
RBC: 3.78 MIL/uL — ABNORMAL LOW (ref 3.87–5.11)
RDW: 17.5 % — ABNORMAL HIGH (ref 11.5–15.5)
WBC: 7.5 10*3/uL (ref 4.0–10.5)
nRBC: 0 % (ref 0.0–0.2)

## 2023-02-15 LAB — BASIC METABOLIC PANEL
Anion gap: 11 (ref 5–15)
BUN: 7 mg/dL (ref 6–20)
CO2: 25 mmol/L (ref 22–32)
Calcium: 9.2 mg/dL (ref 8.9–10.3)
Chloride: 102 mmol/L (ref 98–111)
Creatinine, Ser: 0.83 mg/dL (ref 0.44–1.00)
GFR, Estimated: >60 mL/min (ref >60)
Glucose, Bld: 112 mg/dL — ABNORMAL HIGH (ref 70–99)
Potassium: 3.6 mmol/L (ref 3.5–5.1)
Sodium: 138 mmol/L (ref 135–145)

## 2023-02-15 LAB — VITAMIN D 25 HYDROXY (VIT D DEFICIENCY, FRACTURES): Vit D, 25-Hydroxy: 11.95 ng/mL — ABNORMAL LOW (ref 30–100)

## 2023-02-15 LAB — LIPID PANEL
Cholesterol: 141 mg/dL (ref 0–200)
HDL: 81 mg/dL (ref >40)
LDL Cholesterol: 44 mg/dL (ref 0–99)
Total CHOL/HDL Ratio: 1.7 {ratio}
Triglycerides: 81 mg/dL (ref <150)
VLDL: 16 mg/dL (ref 0–40)

## 2023-02-15 LAB — GLUCOSE, CAPILLARY
Glucose-Capillary: 111 mg/dL — ABNORMAL HIGH (ref 70–99)
Glucose-Capillary: 131 mg/dL — ABNORMAL HIGH (ref 70–99)
Glucose-Capillary: 174 mg/dL — ABNORMAL HIGH (ref 70–99)

## 2023-02-15 LAB — TSH: TSH: 3.371 u[IU]/mL (ref 0.350–4.500)

## 2023-02-15 MED ORDER — WHITE PETROLATUM EX OINT
TOPICAL_OINTMENT | CUTANEOUS | Status: AC
  Administered 2023-02-15: 1
  Filled 2023-02-15: qty 5

## 2023-02-15 MED ORDER — SERTRALINE HCL 100 MG PO TABS
100.0000 mg | ORAL_TABLET | Freq: Every day | ORAL | Status: DC
Start: 2023-02-16 — End: 2023-02-17
  Administered 2023-02-16 – 2023-02-17 (×2): 100 mg via ORAL
  Filled 2023-02-15 (×4): qty 1

## 2023-02-15 NOTE — Progress Notes (Signed)
   02/15/23 0100  Psych Admission Type (Psych Patients Only)  Admission Status Involuntary  Psychosocial Assessment  Patient Complaints Sleep disturbance  Eye Contact Brief  Facial Expression Flat  Affect Appropriate to circumstance  Speech Soft  Interaction Minimal  Motor Activity Slow  Appearance/Hygiene Disheveled  Behavior Characteristics Appropriate to situation  Mood Irritable  Thought Process  Coherency WDL  Content WDL  Delusions None reported or observed  Perception WDL  Hallucination None reported or observed  Judgment Poor  Confusion None  Danger to Self  Current suicidal ideation? Denies  Agreement Not to Harm Self Yes  Description of Agreement Verbal  Danger to Others  Danger to Others None reported or observed

## 2023-02-15 NOTE — Plan of Care (Signed)
  Problem: Education: Goal: Mental status will improve Outcome: Progressing Goal: Verbalization of understanding the information provided will improve Outcome: Progressing   Problem: Activity: Goal: Interest or engagement in activities will improve Outcome: Progressing   

## 2023-02-15 NOTE — Plan of Care (Signed)
  Problem: Education: Goal: Emotional status will improve Outcome: Progressing Goal: Mental status will improve Outcome: Progressing Goal: Verbalization of understanding the information provided will improve Outcome: Progressing   Problem: Activity: Goal: Sleeping patterns will improve Outcome: Progressing   Problem: Coping: Goal: Ability to demonstrate self-control will improve Outcome: Progressing   Problem: Safety: Goal: Periods of time without injury will increase Outcome: Progressing

## 2023-02-15 NOTE — Plan of Care (Signed)
°  Problem: Education: Goal: Emotional status will improve Outcome: Progressing Goal: Mental status will improve Outcome: Progressing Goal: Verbalization of understanding the information provided will improve Outcome: Progressing   Problem: Activity: Goal: Interest or engagement in activities will improve Outcome: Progressing Goal: Sleeping patterns will improve Outcome: Progressing   Problem: Coping: Goal: Ability to demonstrate self-control will improve Outcome: Progressing   Problem: Safety: Goal: Periods of time without injury will increase Outcome: Progressing

## 2023-02-15 NOTE — Progress Notes (Signed)
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 condition.   This note was created using a voice recognition software as a result there may be grammatical errors inadvertently enclosed that do not reflect the nature of this encounter. Every attempt is made to correct such errors.    Signed: Starleen Blue, NP  Starleen Blue, NP 02/15/2023, 2:46 PM Patient ID: [name], female   DOB: Dec 12, 1990, 32 y.o.   MRN: [id number]

## 2023-02-15 NOTE — Group Note (Signed)
Recreation Therapy Group Note   Group Topic:Animal Assisted Therapy   Group Date: 02/15/2023 Start Time: 0950 End Time: 1030 Facilitators: River Ambrosio-McCall, LRT,CTRS Location: [address] Day[address]   Animal-Assisted Activity (AAA) Program Checklist/Progress Notes Patient Eligibility Criteria Checklist & Daily Group note for Rec Tx Intervention  AAA/T Program Assumption of Risk Form signed by Patient/ or Parent Legal Guardian Yes  Patient understands his/her participation is voluntary Yes  Education: Charity fundraiser, Appropriate Animal Interaction   Education Outcome: Acknowledges education.    Affect/Mood: N/A   Participation Level: Did not attend    Clinical Observations/Individualized Feedback:     Plan: Continue to engage patient in RT group sessions 2-3x/week.   Mashelle Busick-McCall, LRT,CTRS  02/15/2023 1:04 PM

## 2023-02-15 NOTE — ED Notes (Signed)
Transport here to pick up pt.

## 2023-02-15 NOTE — Progress Notes (Addendum)
D. Pt presented with and improved mood today- stating "I feel much better". Pt did endorse some lingering abdominal pain, but reported that overall she was feeling better. Pt observed in the milieu this afternoon.  Pt currently denies withdrawal symptoms,SI/HI and AVH  A. Labs and vitals monitored. Pt given and educated on medications. Pt supported emotionally and encouraged to express concerns and ask questions.   R. Pt remains safe with 15 minute checks. Will continue POC.    02/15/23 0900  Psych Admission Type (Psych Patients Only)  Admission Status Involuntary  Psychosocial Assessment  Patient Complaints Sleep disturbance  Eye Contact Brief  Facial Expression Flat  Affect Appropriate to circumstance  Speech Soft  Interaction Minimal  Motor Activity Slow  Appearance/Hygiene Disheveled  Behavior Characteristics Cooperative;Appropriate to situation  Mood Other (Comment) (sleepy)  Thought Process  Coherency WDL  Content WDL  Delusions None reported or observed  Perception WDL  Hallucination None reported or observed  Judgment Poor  Confusion None  Danger to Self  Current suicidal ideation? Denies  Danger to Others  Danger to Others None reported or observed

## 2023-02-15 NOTE — Group Note (Unsigned)
Date:  02/16/2023 Time:  2:05 AM  Group Topic/Focus:  Wrap-Up Group:   The focus of this group is to help patients review their daily goal of treatment and discuss progress on daily workbooks.    Participation Level:  Active  Participation Quality:  Appropriate and Sharing  Affect:  Appropriate  Cognitive:  Appropriate  Insight: Appropriate  Engagement in Group:  Engaged  Modes of Intervention:  Discussion and Socialization  Additional Comments:  Patient used "Wrap-Up Group" sheets and shared one thing from sheet with group.  Patient rated his day a 9/10. Patient stated that his goal for today was to "to work on discharge, and getting used to speaking with other patients" and patient stated that he achieved that goal/"made progress". Patient stated that coping skill(s) that he has found to be most helpful is "refection and talking to other patients".   Grace Montgomery 02/16/2023, 2:05 AM

## 2023-02-15 NOTE — Progress Notes (Signed)
Patient returned from [facility] ED. Alert and oriented, ambulatory and cooperative. VSS, affect calm with mood congruent. Patient denies SI,HI, AVH. Reports headache rated 7/10, prn Tylenol 650mg  po given. PRN Vistaril and Trazodone given for anxiety and sleep. No apparent signs of acute distress noted at this time. Patient declined CBG at this time. Per ED report CT negative, patient has UTI, antibiotics prescribed. Will continue with plan of care.

## 2023-02-16 DIAGNOSIS — F332 Major depressive disorder, recurrent severe without psychotic features: Secondary | ICD-10-CM | POA: Diagnosis not present

## 2023-02-16 LAB — GLUCOSE, CAPILLARY
Glucose-Capillary: 159 mg/dL — ABNORMAL HIGH (ref 70–99)
Glucose-Capillary: 167 mg/dL — ABNORMAL HIGH (ref 70–99)
Glucose-Capillary: 196 mg/dL — ABNORMAL HIGH (ref 70–99)
Glucose-Capillary: 192 mg/dL — ABNORMAL HIGH (ref 70–99)
Glucose-Capillary: 209 mg/dL — ABNORMAL HIGH (ref 70–99)

## 2023-02-16 MED ORDER — VITAMIN D (ERGOCALCIFEROL) 1.25 MG (50000 UNIT) PO CAPS
50000.0000 [IU] | ORAL_CAPSULE | ORAL | Status: DC
  Administered 2023-02-16: 50000 [IU] via ORAL
  Filled 2023-02-16 (×2): qty 1

## 2023-02-16 NOTE — Group Note (Signed)
Recreation Therapy Group Note   Group Topic:Team Building  Group Date: 02/16/2023 Start Time: 0930 End Time: 1000 Facilitators: Kassi Esteve-McCall, LRT,CTRS Location: [address] Dayroom   Group Topic: Communication, Team Building, Problem Solving  Goal Area(s) Addresses:  Patient will effectively work with peer towards shared goal.  Patient will identify skills used to make activity successful.  Patient will identify how skills used during activity can be used to reach post d/c goals.   Intervention: STEM Activity  Group Description: Stage manager. In teams of 3-5, patients were given 12 plastic drinking straws and an equal length of masking tape. Using the materials provided, patients were asked to build a landing pad to catch a golf ball dropped from approximately 5 feet in the air. All materials were required to be used by the team in their design. LRT facilitated post-activity discussion.  Education: Pharmacist, community, Scientist, physiological, Discharge Planning   Education Outcome: Acknowledges education/In group clarification offered/Needs additional education.    Affect/Mood: Appropriate   Participation Level: Engaged   Participation Quality: Independent   Behavior: Appropriate   Speech/Thought Process: Focused   Insight: Good   Judgement: Good   Modes of Intervention: STEM Activity   Patient Response to Interventions:  Engaged   Education Outcome:  In group clarification offered    Clinical Observations/Individualized Feedback: Pt was engaged and needed clarification on the rules and objective of the activity. Pt made suggestions on how the group could create a structure. Pt was focused and intent on completing the task.     Plan: Continue to engage patient in RT group sessions 2-3x/week.   Eleuterio Dollar-McCall, LRT,CTRS 02/16/2023 1:46 PM

## 2023-02-16 NOTE — Progress Notes (Addendum)
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 milieu and in scheduled group therapies              -- Short Term Goals: Ability to identify changes in lifestyle to reduce recurrence of condition will improve and Ability to verbalize feelings will improve             -- Long Term Goals: Improvement in symptoms so as ready for discharge Medications -Continue Zoloft 100 mg daily for depressive symptoms and GAD  -Continue Ativan taper for alcohol detox -Continue hydroxyzine 3 times daily as needed for anxiety   Medications for medical reasons -Start vitamin D 50,000 units weekly for low vitamin D level. -Continue Keflex 250 mg BID for  UTI -Continue Inderal 10 mg BID for tachycardia -Continue metformin 1000 mg twice daily for DM -Discontinue Flagyl 500 mg twice daily  -Continue albuterol as needed for wheezing or shortness of breath    PRNS:  Tylenol 650 mg every 6 hours as needed Albuterol inhaler every 6 hours as needed Maalox Mylanta every 4 hours as needed Agitation protocol (Haldol, Benadryl, Ativan) Ativan 0.5 mg 3 times daily as needed for anxiety Luke magnesia daily as needed Cepacol lozenges every 4 hours as needed   Other labs reviewed on admission:  Labs obtained from 02/09/2023 A1c 6% CMP showing mild hypokalemia 3.4, otherwise unremarkable Ethanol level 325 Salicylate and acetaminophen levels negative for toxicity CBC showing modestly elevated platelets, otherwise unremarkable serum hCG negative              3. Medical Issues Being Addressed:    #Tobacco Use Disorder  Nicorette gum ordered Smoking cessation encouraged   #T2DM Metformin 1,00 mg BID   4. Discharge Planning:              -- Social work and case management to assist with discharge planning and identification of hospital follow-up needs prior to discharge             -- Estimated Discharge Date: 5-7 days             -- Discharge Concerns: Need to establish a safety plan; Medication compliance and effectiveness             -- Discharge Goals: Return home with outpatient referrals for mental health follow-up including medication management/psychotherapy    I certify that inpatient services furnished can reasonably be expected to improve the patient's condition.   This note was created using a voice recognition software as a result there may be grammatical errors inadvertently enclosed that do not reflect the nature of this encounter. Every attempt is made to correct such errors.    Signed: Starleen Blue, NP  Starleen Blue,  NP 02/16/2023, 2:06 PM Patient ID: Grace Montgomery, female   DOB: January 15, 1991, 32 y.o.   MRN: [id number]

## 2023-02-16 NOTE — Group Note (Signed)
Date:  02/16/2023 Time:  10:45 AM  Group Topic/Focus:  Goals Group:   The focus of this group is to help patients establish daily goals to achieve during treatment and discuss how the patient can incorporate goal setting into their daily lives to aide in recovery. Orientation:   The focus of this group is to educate the patient on the purpose and policies of crisis stabilization and provide a format to answer questions about their admission.  The group details unit policies and expectations of patients while admitted.    Participation Level:  Active  Participation Quality:  Appropriate  Affect:  Appropriate  Cognitive:  Appropriate  Insight: Appropriate  Engagement in Group:  Engaged  Modes of Intervention:  Discussion, Orientation, and Rapport Building  Additional Comments:   Pt attended and participated in the Orientation/Goals group. Pt personal goal is to obtain help with her children's Christmas and to find a job and housing.  Edmund Hilda Elsy Chiang 02/16/2023, 10:45 AM

## 2023-02-16 NOTE — B[facility] Group Notes (Signed)
Spiritual care group on grief and loss facilitated by Chaplain Dyanne Carrel, [facility]  Group Goal: Support / Education around grief and loss  Members engage in facilitated group support and psycho-social education.  Group Description:  Following introductions and group rules, group members engaged in facilitated group dialogue and support around topic of loss, with particular support around experiences of loss in their lives. Group Identified types of loss (relationships / self / things) and identified patterns, circumstances, and changes that precipitate losses. Reflected on thoughts / feelings around loss, normalized grief responses, and recognized variety in grief experience. Group encouraged individual reflection on safe space and on the coping skills that they are already utilizing.  Group drew on Adlerian / Rogerian and narrative framework  Patient Progress: Grace Montgomery attended group and actively engaged and participated in group conversation and activities.

## 2023-02-16 NOTE — Progress Notes (Signed)
   02/15/23 2100  Psych Admission Type (Psych Patients Only)  Admission Status Involuntary  Psychosocial Assessment  Patient Complaints Depression  Eye Contact Brief  Facial Expression Flat  Affect Appropriate to circumstance  Speech Soft  Interaction Minimal  Motor Activity Slow  Appearance/Hygiene Disheveled  Behavior Characteristics Cooperative;Appropriate to situation  Mood Depressed  Thought Process  Coherency WDL  Content WDL  Delusions None reported or observed  Perception WDL  Hallucination None reported or observed  Judgment Poor  Confusion None  Danger to Self  Current suicidal ideation? Denies  Agreement Not to Harm Self Yes  Description of Agreement Verbal  Danger to Others  Danger to Others None reported or observed

## 2023-02-16 NOTE — Progress Notes (Signed)
Per [facility] as this patient walking to Cafe with the group for breakfast, another patient asked her a question and she replied with a racial slur " be quiet cracker". The other patient requested to return to his room and was escorted back to unit. Will inform other staff and monitor closely. These patients are both on the 400 hall.

## 2023-02-16 NOTE — Plan of Care (Signed)
  Problem: Education: Goal: Emotional status will improve Outcome: Progressing Goal: Mental status will improve Outcome: Progressing Goal: Verbalization of understanding the information provided will improve Outcome: Progressing   Problem: Activity: Goal: Interest or engagement in activities will improve Outcome: Progressing Goal: Sleeping patterns will improve Outcome: Progressing   Problem: Coping: Goal: Ability to demonstrate self-control will improve Outcome: Progressing   Problem: Safety: Goal: Periods of time without injury will increase Outcome: Progressing   Problem: Coping: Goal: Will verbalize feelings Outcome: Progressing

## 2023-02-16 NOTE — Group Note (Signed)
Date:  02/17/2023 Time:  4:35 AM  Group Topic/Focus:  Wrap-Up Group:   The focus of this group is to help patients review their daily goal of treatment and discuss progress on daily workbooks.    Participation Level:  Active  Participation Quality:  Appropriate, Redirectable, and Sharing  Affect:  Appropriate  Cognitive:  Appropriate  Insight: Appropriate and Limited  Engagement in Group:  Distracting and Engaged  Modes of Intervention:  Activity and Socialization  Additional Comments:  Patient stated that her day was "Straight". Patient stated that she "chilled and got out of her room more". Patient stated that he goal is to "work on being discharged", patient stated that she has not really talked to anyone regarding discharge. Patient rated her day a 7/10. Patient particiapted in group activity at the end of the group.   Kennieth Francois 02/17/2023, 4:35 AM

## 2023-02-16 NOTE — Plan of Care (Signed)
  Problem: Education: Goal: Mental status will improve Outcome: Progressing   Problem: Activity: Goal: Sleeping patterns will improve Outcome: Progressing   Problem: Health Behavior/Discharge Planning: Goal: Compliance with treatment plan for underlying cause of condition will improve Outcome: Progressing   Problem: Safety: Goal: Periods of time without injury will increase Outcome: Progressing

## 2023-02-16 NOTE — Progress Notes (Signed)
Patient denies SI,HI, and A/V/H with no plan or intent. Patient received tylenol po prn due to headache. Patient also verbalized some pain from UTI but stated It is improving. Patient is med compliant and cooperative on unit.    02/16/23 0840  Psych Admission Type (Psych Patients Only)  Admission Status Involuntary  Psychosocial Assessment  Patient Complaints Malaise (UTI)  Eye Contact Fair  Facial Expression Flat  Affect Flat  Speech Logical/coherent  Interaction Minimal  Motor Activity Slow  Appearance/Hygiene Disheveled  Behavior Characteristics Cooperative;Appropriate to situation  Mood Depressed  Thought Process  Coherency WDL  Content WDL  Delusions None reported or observed  Perception WDL  Hallucination None reported or observed  Judgment Poor  Confusion None  Danger to Self  Current suicidal ideation? Denies  Agreement Not to Harm Self Yes  Description of Agreement verbal  Danger to Others  Danger to Others None reported or observed

## 2023-02-16 NOTE — BHH Group Notes (Signed)

## 2023-02-17 DIAGNOSIS — F332 Major depressive disorder, recurrent severe without psychotic features: Secondary | ICD-10-CM | POA: Diagnosis not present

## 2023-02-17 LAB — GLUCOSE, CAPILLARY
Glucose-Capillary: 134 mg/dL — ABNORMAL HIGH (ref 70–99)
Glucose-Capillary: 247 mg/dL — ABNORMAL HIGH (ref 70–99)

## 2023-02-17 MED ORDER — ADULT MULTIVITAMIN W/MINERALS CH: 1.0000 | ORAL_TABLET | Freq: Every day | ORAL | 0 refills | Status: DC

## 2023-02-17 MED ORDER — SERTRALINE HCL 100 MG PO TABS: 100.0000 mg | ORAL_TABLET | Freq: Every day | ORAL | 0 refills | Status: DC

## 2023-02-17 MED ORDER — FOLIC ACID 1 MG PO TABS: 1.0000 mg | ORAL_TABLET | Freq: Every day | ORAL | 0 refills | Status: DC

## 2023-02-17 MED ORDER — ALBUTEROL SULFATE HFA 108 (90 BASE) MCG/ACT IN AERS: 2.0000 | INHALATION_SPRAY | Freq: Four times a day (QID) | RESPIRATORY_TRACT | 0 refills | Status: DC | PRN

## 2023-02-17 MED ORDER — MELATONIN 3 MG PO TABS: 3.0000 mg | ORAL_TABLET | Freq: Every evening | ORAL | 0 refills | Status: DC | PRN

## 2023-02-17 MED ORDER — WHITE PETROLATUM EX OINT
TOPICAL_OINTMENT | CUTANEOUS | Status: AC
  Administered 2023-02-17: 1
  Filled 2023-02-17: qty 5

## 2023-02-17 MED ORDER — MENTHOL 3 MG MT LOZG: 1.0000 | LOZENGE | OROMUCOSAL | 12 refills | Status: DC | PRN

## 2023-02-17 MED ORDER — VITAMIN B-1 100 MG PO TABS: 100.0000 mg | ORAL_TABLET | Freq: Every day | ORAL | 0 refills | Status: DC

## 2023-02-17 MED ORDER — VITAMIN D (ERGOCALCIFEROL) 1.25 MG (50000 UNIT) PO CAPS: 50000.0000 [IU] | ORAL_CAPSULE | ORAL | 0 refills | Status: DC

## 2023-02-17 MED ORDER — METFORMIN HCL 1000 MG PO TABS: 1000.0000 mg | ORAL_TABLET | Freq: Two times a day (BID) | ORAL | 1 refills | Status: DC

## 2023-02-17 MED ORDER — NICOTINE POLACRILEX 2 MG MT GUM: 2.0000 mg | CHEWING_GUM | OROMUCOSAL | 0 refills | Status: DC | PRN

## 2023-02-17 MED ORDER — INSULIN ASPART 100 UNIT/ML IJ SOLN: 0.0000 [IU] | Freq: Three times a day (TID) | INTRAMUSCULAR | 11 refills | Status: DC

## 2023-02-17 MED ORDER — CEPHALEXIN 250 MG PO CAPS: 250.0000 mg | ORAL_CAPSULE | Freq: Four times a day (QID) | ORAL | 0 refills | Status: DC

## 2023-02-17 MED ORDER — HYDROXYZINE HCL 25 MG PO TABS: 25.0000 mg | ORAL_TABLET | Freq: Three times a day (TID) | ORAL | 0 refills | Status: DC | PRN

## 2023-02-17 MED ORDER — PROPRANOLOL HCL 10 MG PO TABS: 10.0000 mg | ORAL_TABLET | Freq: Two times a day (BID) | ORAL | 0 refills | Status: DC

## 2023-02-17 NOTE — Progress Notes (Signed)
   02/17/23 0900  Psych Admission Type (Psych Patients Only)  Admission Status Involuntary  Psychosocial Assessment  Patient Complaints None  Eye Contact Fair  Facial Expression Flat  Affect Flat  Speech Logical/coherent  Interaction Minimal  Motor Activity Slow  Appearance/Hygiene Disheveled  Behavior Characteristics Appropriate to situation  Mood Depressed  Thought Process  Coherency WDL  Content WDL  Delusions None reported or observed  Perception WDL  Hallucination None reported or observed  Judgment Limited  Confusion None  Danger to Self  Current suicidal ideation? Denies  Agreement Not to Harm Self Yes  Description of Agreement Verbal  Danger to Others  Danger to Others None reported or observed

## 2023-02-17 NOTE — Progress Notes (Signed)
  [facility] Adult Case Management Discharge Plan :  Will you be returning to the same living situation after discharge:  Yes,  pt will be returning home at discharge At discharge, do you have transportation home?: Yes,  CSW to arrange [employer] for 1:00PM Do you have the ability to pay for your medications: Yes,  pt has [facility]  Release of information consent forms completed and in the chart;  Patient's signature needed at discharge.  Patient to Follow up at:  Follow-up Information     [place] Go on 02/23/2023.   Specialty: Professional Counselor Why: Please go to this provider on 02/23/23 at 9:00 am for therapy services. You may also go during walk in hours for new patients: Monday through Friday, 9 am to 1 pm. Contact information: [address] [place] [address] [phone]         [facility]. Go on 02/22/2023.   Specialty: Behavioral Health Why: Please go to this provider on 02/22/23 at 7:00 am for medication management services. For faster service, you may also go during walk in hours for new patients: Monday through Friday, arrive by 7:00 am, for same day service. Contact information: [address] [place] [address] [phone]                Next level of care provider has access to [facility] Link:no  Safety Planning and Suicide Prevention discussed: Yes,  Lorely Demir (mom) [phone]     Has patient been referred to the Quitline?: Patient refused referral for treatment. Pt reports using nicotine but is not interested in a referral to quit.  Patient has been referred for addiction treatment: Patient refused referral for treatment. Pt reports not needing treatment for substance use.   Kathi Der, LCSWA 02/17/2023, 9:26 AM

## 2023-02-17 NOTE — BHH Suicide Risk Assessment (Signed)
B[facility] INPATIENT:  Family/Significant Other Suicide Prevention Education  Suicide Prevention Education:  Education Completed; Nayonna Sideris (mom) [phone],  (name of family member/significant other) has been identified by the patient as the family member/significant other with whom the patient will be residing, and identified as the person(s) who will aid the patient in the event of a mental health crisis (suicidal ideations/suicide attempt).  With written consent from the patient, the family member/significant other has been provided the following suicide prevention education, prior to the and/or following the discharge of the patient.  Mom reports that there are no firearms or weapons in pt's home, no concerns for patient discharging. Mom unable to pick pt up, CSW to arrange transportation back home.   The suicide prevention education provided includes the following: Suicide risk factors Suicide prevention and interventions National Suicide Hotline telephone number [facility] assessment telephone number [facility] Emergency Assistance 911 [facility] and/or Residential Mobile Crisis Unit telephone number  Request made of family/significant other to: Remove weapons (e.g., guns, rifles, knives), all items previously/currently identified as safety concern.   Remove drugs/medications (over-the-counter, prescriptions, illicit drugs), all items previously/currently identified as a safety concern.  The family member/significant other verbalizes understanding of the suicide prevention education information provided.  The family member/significant other agrees to remove the items of safety concern listed above.  Kathi Der 02/17/2023, 9:09 AM

## 2023-02-17 NOTE — Progress Notes (Signed)
Patient discharged to home via taxi. Discharge instructions, prescription, all required discharge documents and information about follow-up appointment given to pt with verbalization of understanding. All personal belongings given to pt at time of discharge. Plan of Care resolved. Pt denied SI/HI and AVH. Pt escorted to lobby by RN at 1310.

## 2023-02-17 NOTE — BHH Suicide Risk Assessment (Cosign Needed Addendum)
Suicide Risk Assessment  Discharge Assessment    [facility] Discharge Suicide Risk Assessment   Principal Problem: MDD (major depressive disorder), recurrent episode, severe (HCC) Discharge Diagnoses: Principal Problem:   MDD (major depressive disorder), recurrent episode, severe (HCC) Active Problems:   MDD (major depressive disorder), recurrent severe, without psychosis (HCC)   Type 2 diabetes mellitus without complication, without long-term current use of insulin (HCC)   GAD (generalized anxiety disorder)   Nicotine dependence   Delta-9-tetrahydrocannabinol (THC) dependence (HCC)   Cocaine use disorder (HCC)   Alcohol use disorder   Insomnia   UTI (urinary tract infection)  Reason for admission:  Grace Montgomery is a 32 y.o. female  with a past psychiatric history of MDD recurrent severe, alcohol use disorder, anxiety & insomnia who initially presented to [facility] on 02/11/2023 via law enforcement after attempting suicide via alcohol intoxication & Cymbalta OD. She was treated and stabilized, and admitted to [facility] under IVC on 02/12/2023 for treatment and stabilization of her mental health crises. Pt has one prior psychiatric hospitalization at this [facility] on sept 2022. PMHx is significant for asthma and diabetes.   Total Time spent with patient: 30 minutes  Musculoskeletal: Strength & Muscle Tone: within normal limits Gait & Station: normal Patient leans: N/A  Psychiatric Specialty Exam  Presentation  General Appearance:  Casual; Fairly Groomed  Eye Contact: Fair  Speech: Clear and Coherent; Normal Rate  Speech Volume: Normal  Handedness: Right  Mood and Affect  Mood: Irritable  Duration of Depression Symptoms: No data recorded Affect: -- (Improving)  Thought Process  Thought Processes: Coherent  Descriptions of Associations:Intact  Orientation:Full (Time, Place and Person)  Thought Content:Logical  History of Schizophrenia/Schizoaffective disorder:No data  recorded Duration of Psychotic Symptoms:No data recorded Hallucinations:Hallucinations: None  Ideas of Reference:None  Suicidal Thoughts:Suicidal Thoughts: No  Homicidal Thoughts:Homicidal Thoughts: No  Sensorium  Memory: Immediate Fair; Recent Fair  Judgment: Fair  Insight: Fair  Art therapist  Concentration: Fair  Attention Span: Fair  Recall: [employer] of Knowledge: Fair  Language: Fair  Psychomotor Activity  Psychomotor Activity: Psychomotor Activity: Normal  Assets  Assets: Resilience; Communication Skills; Desire for Improvement; Housing  Sleep  Sleep: Sleep: Good Number of Hours of Sleep: 8  Physical Exam: Physical Exam Vitals and nursing note reviewed.  HENT:     Head: Normocephalic.     Mouth/Throat:     Mouth: Mucous membranes are moist.  Cardiovascular:     Rate and Rhythm: Normal rate.     Heart sounds: Normal heart sounds.  Pulmonary:     Effort: Pulmonary effort is normal.  Abdominal:     Comments: Deferred  Genitourinary:    Comments: Deferred Skin:    General: Skin is warm.  Neurological:     General: No focal deficit present.     Mental Status: She is alert and oriented to person, place, and time.  Psychiatric:        Behavior: Behavior normal.    Review of Systems  Constitutional:  Negative for chills and fever.  HENT:  Negative for sore throat.   Eyes:  Negative for blurred vision.  Respiratory:  Negative for cough, sputum production, shortness of breath and wheezing.   Cardiovascular:  Negative for chest pain and palpitations.  Gastrointestinal:  Negative for abdominal pain, constipation, diarrhea, heartburn, nausea and vomiting.  Genitourinary:        Patient being treated for UTI with Keflex 250 mg p.o. twice daily x 5 days  Neurological:  Negative for dizziness, tingling, tremors, sensory change and headaches.  Endo/Heme/Allergies:        See allergy listing  Psychiatric/Behavioral:  Positive for  depression (Stable with medication). The patient is nervous/anxious (Improved with medication) and has insomnia (Improved with medication).    Blood pressure (!) 101/57, pulse 80, temperature 98.7 F (37.1 C), temperature source Oral, resp. rate 19, height 5\' 3"  (1.6 m), weight 87.1 kg, SpO2 100%. Body mass index is 34.01 kg/m.  Mental Status Per Nursing Assessment::   On Admission:  Suicidal ideation indicated by patient  Demographic Factors:  Adolescent or young adult and Low socioeconomic status  Loss Factors: Financial problems/change in socioeconomic status  Historical Factors: Impulsivity  Risk Reduction Factors:   Positive social support, Positive therapeutic relationship, and Positive coping skills or problem solving skills  Continued Clinical Symptoms:  Bipolar Disorder:   Depressive phase Depression:   Recent sense of peace/wellbeing Alcohol/Substance Abuse/Dependencies More than one psychiatric diagnosis Previous Psychiatric Diagnoses and Treatments Medical Diagnoses and Treatments/Surgeries  Cognitive Features That Contribute To Risk:  Polarized thinking    Suicide Risk:  Mild: There are no identifiable plans, no associated intent, mild dysphoria and related symptoms, good self-control (both objective and subjective assessment), few other risk factors, and identifiable protective factors, including available and accessible social support.   Follow-up Information     [place] Go on 02/23/2023.   Specialty: Professional Counselor Why: Please go to this provider on 02/23/23 at 9:00 am for therapy services. You may also go during walk in hours for new patients: Monday through Friday, 9 am to 1 pm. Contact information: [address] [place] [address] [phone]         [facility]. Go on 02/22/2023.   Specialty: Behavioral Health Why: Please go to this provider on 02/22/23 at 7:00 am for  medication management services. For faster service, you may also go during walk in hours for new patients: Monday through Friday, arrive by 7:00 am, for same day service. Contact information: [address] [place] [address] [phone]               Plan Of Care/Follow-up recommendations:  Discharge Recommendations:  The patient is being discharged with her family. Patient is to take her discharge medications as ordered.  See follow up above. We recommend that she participates in individual therapy to target uncontrollable agitation and substance abuse.  We recommend that she participates in therapy to target the conflict with her family, to improve communication skills and conflict resolution skills.  patient is to initiate/implement a contingency based behavioral model to address patient's behavior. We recommend that she gets AIMS scale, height, weight, blood pressure, fasting lipid panel, fasting blood sugar in three months from discharge if she's on atypical antipsychotics.  Patient will benefit from monitoring of recurrent suicidal ideation since patient is on antidepressant medication. The patient should abstain from all illicit substances and alcohol. If the patient's symptoms worsen or do not continue to improve or if the patient becomes actively suicidal or homicidal then it is recommended that the patient return to the closest hospital emergency room or call 911 for further evaluation and treatment. National Suicide Prevention Lifeline 1800-SUICIDE or 662 590 5278. Please follow up with your primary medical doctor for all other medical needs.  The patient has been educated on the possible side effects to medications and she/her guardian is to contact a medical professional and inform outpatient provider of any  new side effects of medication. She is to take regular diet and activity as tolerated.  Will benefit from moderate daily exercise. Patient was educated about  removing/locking any firearms, medications or dangerous products from the home.  Activity:  As tolerated Diet: Carbohydrate Modified diet  Cecilie Lowers, FNP 02/17/2023, 10:43 AM

## 2023-02-17 NOTE — Progress Notes (Signed)
     02/17/2023       9:35 AM   Sherron Ales D Andre   Type of Note: PO Ryan Wilson  Pt and PO requested phone call to be made the morning of when patient was discharging from the hospital. CSW called and LVM to PO Wilson.   Signed:  Stayce Delancy, LCSW-A 02/17/2023  9:35 AM

## 2023-02-17 NOTE — Progress Notes (Signed)
   02/16/23 2000  Psych Admission Type (Psych Patients Only)  Admission Status Involuntary  Psychosocial Assessment  Patient Complaints Crying spells  Eye Contact Fair  Facial Expression Flat  Affect Flat  Speech Logical/coherent  Interaction Minimal  Motor Activity Slow  Appearance/Hygiene Disheveled  Behavior Characteristics Cooperative;Appropriate to situation  Mood Depressed  Thought Process  Coherency WDL  Content WDL  Delusions None reported or observed  Perception WDL  Hallucination None reported or observed  Judgment Poor  Confusion None  Danger to Self  Current suicidal ideation? Denies  Agreement Not to Harm Self Yes  Description of Agreement Verbal  Danger to Others  Danger to Others None reported or observed

## 2023-02-17 NOTE — Transportation (Signed)
02/17/2023  Grace Montgomery DOB: [birth year] MRN: [id number]   RIDER WAIVER AND RELEASE OF LIABILITY  For the purposes of helping with transportation needs, [facility] partners with outside transportation providers (taxi companies, [place], Catering manager.) to give [employer] patients or other approved people the choice of on-demand rides [employer]") to our buildings for non-emergency visits.  By using [employer], I, the person signing this document, on behalf of myself and/or any legal minors (in my care using the [employer]), agree:  Science writer given to me are supplied by independent, outside transportation providers who do not work for, or have any affiliation with, [employer]. [employer] is not a transportation company. [facility] has no control over the quality or safety of the rides I get using [employer]. [facility] [employer] has no control over whether any outside ride will happen on time or not. [facility] gives no guarantee on the reliability, quality, safety, or availability on any rides, or that no mistakes will happen. I know and accept that traveling by vehicle (car, truck, SVU, Zenaida Niece, bus, taxi, etc.) has risks of serious injuries such as disability, being paralyzed, and death. I know and agree the risk of using [employer] is mine alone, and not [employer]. Transport Services are provided "as is" and as are available. The transportation providers are in charge for all inspections and care of the vehicles used to provide these rides. I agree not to take legal action against [facility], its agents, employees, officers, directors, representatives, insurers, attorneys, assigns, successors, subsidiaries, and affiliates at any time for any reasons related directly or indirectly to using [employer]. I also agree not to take legal action against [facility] or its affiliates for any injury, death, or damage to property caused by or related to using  [employer]. I have read this Waiver and Release of Liability, and I understand the terms used in it and their legal meaning. This Waiver is freely and voluntarily given with the understanding that my right (or any legal minors) to legal action against [facility] relating to [employer] is knowingly given up to use these services.   I attest that I read the Ride Waiver and Release of Liability to Grace Montgomery, gave [name] the opportunity to ask questions and answered the questions asked (if any). I affirm that Grace Montgomery then provided consent for assistance with transportation.
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
## 2023-02-17 NOTE — Plan of Care (Signed)
  Problem: Education: Goal: Mental status will improve Outcome: Progressing   Problem: Education: Goal: Emotional status will improve Outcome: Progressing   Problem: Activity: Goal: Interest or engagement in activities will improve Outcome: Progressing Goal: Sleeping patterns will improve Outcome: Progressing   Problem: Coping: Goal: Ability to verbalize frustrations and anger appropriately will improve Outcome: Progressing Goal: Ability to demonstrate self-control will improve Outcome: Progressing   Problem: Health Behavior/Discharge Planning: Goal: Identification of resources available to assist in meeting health care needs will improve Outcome: Progressing Goal: Compliance with treatment plan for underlying cause of condition will improve Outcome: Progressing   Problem: Physical Regulation: Goal: Ability to maintain clinical measurements within normal limits will improve Outcome: Progressing   Problem: Safety: Goal: Periods of time without injury will increase Outcome: Progressing

## 2023-02-18 ENCOUNTER — Other Ambulatory Visit: Payer: Self-pay | Admitting: Licensed Clinical Social Worker

## 2023-02-18 NOTE — Patient Instructions (Signed)
Tailored Plan Medicaid On July 1, some people on [place] Medicaid will move to a new kind of Medicaid health plan called a Tailored Plan. Tailored Plans cover your doctor visits, prescription drugs, and health care services.    If your [place] Medicaid will move to a Tailored Plan, you should have gotten a letter and welcome packet. If you're not sure, call your [place] Medicaid Enrollment Broker at [phone] and ask.  Check out these free materials, in [place] and [place], to learn more about your Tailored Plan: [email or web address]-Plans/Toolkit  Tailored Care Management Services  TCM services are available to you now. If you are a Tailored Plan member or will be and want information about Tailored Care Management Services including rides to appointments and community and home services, call the Care Management provider for your county of residence:    [facility] ([place], [place])  Member Services: [phone] Behavioral Health Crisis Line: 956-839-7639, [place], [place], [place], [place])  Member Services: [phone] Behavioral Health Crisis Line: 702-888-6246  [facility] Renard Hamper) Member Services: [phone] Behavioral Health Crisis Line: 4161520738           24- Hour Availability:    [facility]  [address] [place], [place] [phone] Crisis [phone]   [facility] [phone]  [place] [facility]  [phone]    [facility]  [phone] (after hours)   Therapeutic Alternative/Mobile Crisis   [phone]   [place] National Suicide Hotline  714-519-5389 Len Childs) [place] 169   Call [phone]) [phone] for mental health emergencies   [facility]  [phone]);  [place] and [employer]  [phone]); [place], [place], [place], [place], [place], [place], [place]    [place]  Health Urgent Care for [facility] Residents For 24/7 walk-up access to mental health services for [facility] children (4+), adolescents and adults, please visit the [facility] located at [address] in [place], [place].  *[facility] also provides comprehensive outpatient behavioral health services in a variety of locations around the [place].  Connect With [place] [address] [place], [place] [address] HelpLine: 7852082338 or [phone]  Get Directions  Find Help 24/7 By Phone Call our 24-hour HelpLine at 864-871-3258 or [phone] for immediate assistance for mental health and substance abuse issues.  Walk-In Help [place]: [facility] (Ages 4 and Up) [place]: Emergency Dept., [facility] Additional Resources National Hopeline Network: 1-800-SUICIDE The National Suicide Prevention Lifeline: 1-800-273-TALK     The following coping skill education was provided for stress relief and mental health management: "When your car dies or a deadline looms, how do you respond? Long-term, low-grade or acute stress takes a serious toll on your body and mind, so don't ignore feelings of constant tension. Stress is a natural part of life. However, too much stress can harm our health, especially if it continues every day. This is chronic stress and can put you at risk for heart problems like heart disease and depression. Understand what's happening inside your body and learn simple coping skills to combat the negative impacts of everyday stressors.  Types of Stress There are two types of stress: Emotional - types of emotional stress are relationship problems, pressure at work, financial worries, experiencing discrimination or having a major life change. Physical - Examples of physical stress include being sick having pain, not sleeping well, recovery from an injury or  having an alcohol and  drug use disorder. Fight or Flight Sudden or ongoing stress activates your nervous system and floods your bloodstream with adrenaline and cortisol, two hormones that raise blood pressure, increase heart rate and spike blood sugar. These changes pitch your body into a fight or flight response. That enabled our ancestors to outrun saber-toothed tigers, and it's helpful today for situations like dodging a car accident. But most modern chronic stressors, such as finances or a challenging relationship, keep your body in that heightened state, which hurts your health. Effects of Too Much Stress If constantly under stress, most of [place] will eventually start to function less well.  Multiple studies link chronic stress to a higher risk of heart disease, stroke, depression, weight gain, memory loss and even premature death, so it's important to recognize the warning signals. Talk to your doctor about ways to manage stress if you're experiencing any of these symptoms: Prolonged periods of poor sleep. Regular, severe headaches. Unexplained weight loss or gain. Feelings of isolation, withdrawal or worthlessness. Constant anger and irritability. Loss of interest in activities. Constant worrying or obsessive thinking. Excessive alcohol or drug use. Inability to concentrate.  10 Ways to Cope with Chronic Stress It's key to recognize stressful situations as they occur because it allows you to focus on managing how you react. We all need to know when to close our eyes and take a deep breath when we feel tension rising. Use these tips to prevent or reduce chronic stress. 1. Rebalance Work and Home All work and no play? If you're spending too much time at the office, intentionally put more dates in your calendar to enjoy time for fun, either alone or with others. 2. Get Regular Exercise Moving your body on a regular basis balances the nervous system and increases blood circulation, helping to flush out stress  hormones. Even a daily 20-minute walk makes a difference. Any kind of exercise can lower stress and improve your mood ? just pick activities that you enjoy and make it a regular habit. 3. Eat Well and Limit Alcohol and Stimulants Alcohol, nicotine and caffeine may temporarily relieve stress but have negative health impacts and can make stress worse in the long run. Well-nourished bodies cope better, so start with a good breakfast, add more organic fruits and vegetables for a well-balanced diet, avoid processed foods and sugar, try herbal tea and drink more water. 4. Connect with Supportive People Talking face to face with another person releases hormones that reduce stress. Lean on those good listeners in your life. 5. Carve Out Hobby Time Do you enjoy gardening, reading, listening to music or some other creative pursuit? Engage in activities that bring you pleasure and joy; research shows that reduces stress by almost half and lowers your heart rate, too. 6. Practice Meditation, Stress Reduction or Yoga Relaxation techniques activate a state of restfulness that counterbalances your body's fight-or-flight hormones. Even if this also means a 10-minute break in a long day: listen to music, read, go for a walk in nature, do a hobby, take a bath or spend time with a friend. Also consider doing a mindfulness exercise or try a daily deep breathing or imagery practice. Deep Breathing Slow, calm and deep breathing can help you relax. Try these steps to focus on your breathing and repeat as needed. Find a comfortable position and close your eyes. Exhale and drop your shoulders. Breathe in through your nose; fill your lungs and then your belly. Think of relaxing your  body, quieting your mind and becoming calm and peaceful. Breathe out slowly through your nose, relaxing your belly. Think of releasing tension, pain, worries or distress. Repeat steps three and four until you feel relaxed. Imagery This involves  using your mind to excite the senses -- sound, vision, smell, taste and feeling. This may help ease your stress. Begin by getting comfortable and then do some slow breathing. Imagine a place you love being at. It could be somewhere from your childhood, somewhere you vacationed or just a place in your imagination. Feel how it is to be in the place you're imagining. Pay attention to the sounds, air, colors, and who is there with you. This is a place where you feel cared for and loved. All is well. You are safe. Take in all the smells, sounds, tastes and feelings. As you do, feel your body being nourished and healed. Feel the calm that surrounds you. Breathe in all the good. Breathe out any discomfort or tension. 7. Sleep Enough If you get less than seven to eight hours of sleep, your body won't tolerate stress as well as it could. If stress keeps you up at night, address the cause, and add extra meditation into your day to make up for the lost z's. Try to get seven to nine hours of sleep each night. Make a regular bedtime schedule. Keep your room dark and cool. Try to avoid computers, TV, cell phones and tablets before bed. 8. Bond with Connections You Enjoy Go out for a coffee with a friend, chat with a neighbor, call a family member, visit with a clergy member, or even hang out with your pet. Clinical studies show that spending even a short time with a companion animal can cut anxiety levels almost in half. 9. Take a Vacation Getting away from it all can reset your stress tolerance by increasing your mental and emotional outlook, which makes you a happier, more productive person upon return. Leave your cellphone and laptop at home! 10. See a Counselor, Coach or Therapist If negative thoughts overwhelm your ability to make positive changes, it's time to seek professional help. Make an appointment today--your health and life are worth it."  Dickie La, BSW, MSW, LCSW Licensed Clinical Social  Worker [employer]   [facility] [email or web address] Direct Dial: [phone]

## 2023-02-18 NOTE — Patient Outreach (Signed)
Medicaid Managed Care Social Work Note  02/18/2023 Name:  Grace Montgomery MRN:  [id number] DOB:  Feb 07, 1991  Grace Montgomery is an 32 y.o. year old female who is a primary patient of McDiarmid, Leighton Roach, MD.  The Medicaid Managed Care Coordination team was consulted for assistance with:  Community Resources  Mental Health Counseling and Resources  Engaged with patient  for by telephone forinitial visit in response to referral for case management and/or care coordination services.   Patient is participating in a Managed Medicaid Plan:  Yes  Assessments/Interventions:  Review of past medical history, allergies, medications, health status, including review of consultants reports, laboratory and other test data, was performed as part of comprehensive evaluation and provision of chronic care management services.  SDOH: (Social Drivers of Health) assessments and interventions performed: SDOH Interventions    Flowsheet Row Patient Outreach Telephone from 02/18/2023 in [place] [facility] DEPARTMENT ED to Hosp-Admission (Discharged) from [facility] Office Visit from 02/09/2023 in [facility] [place]. [facility] Telephone from 01/14/2022 in [facility] Community Care Coordination Office Visit from 04/17/2020 in [facility] Of . [facility]  SDOH Interventions       Housing Interventions Intervention Not Indicated  [Pt now at a boarding house but is at risk of losing housing. [facility] BSW referral made] -- -- Intervention Not Indicated --  Transportation Interventions Payor Benefit Taxi Voucher Given, Inpatient TOC -- -- --  Depression Interventions/Treatment  Referral to Psychiatry -- Medication -- Counseling  Financial Strain Interventions Community Resources Provided  [Referral to [facility] BSW] -- -- Intervention Not Indicated --  Stress Interventions Offered  [employer], Provide Counseling  [Referral made for [facility] and therapy appt made for [employer] of the [place] for 02/23/23] -- -- -- --       Care Plan                 Allergies  Allergen Reactions   Monistat [Tioconazole] Swelling and Other (See Comments)    Vaginal topical   Chocolate Itching and Other (See Comments)    Ears & throat itch   Ibuprofen Swelling and Other (See Comments)    Swelling of the feet   Banana Itching and Other (See Comments)    Throat itching    Medications Reviewed Today   Medications were not reviewed in this encounter     Patient Active Problem List   Diagnosis Date Noted   UTI (urinary tract infection) 02/15/2023   GAD (generalized anxiety disorder) 02/13/2023   Nicotine dependence 02/13/2023   Delta-9-tetrahydrocannabinol (THC) dependence (HCC) 02/13/2023   Cocaine use disorder (HCC) 02/13/2023   Alcohol use disorder 02/13/2023   Insomnia 02/13/2023   MDD (major depressive disorder), recurrent episode, severe (HCC) 02/12/2023   Cholecystitis 01/11/2022   Suicidal ideation 11/07/2020   Disorder of scalp 04/24/2020   Nail disorder 04/18/2020   Healthcare maintenance 04/18/2020   History of tobacco use 09/18/2018   MDD (major depressive disorder), recurrent severe, 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   Type 2 diabetes mellitus without complication, without long-term current use of insulin (HCC) 11/06/2016   Major depressive disorder 04/26/2011    Class: Acute   Exercise-induced asthma 12/14/89    Patient has [employer] and was advised to contact program to get connected with their case management program, possible ACT team involvement and housing  services. Patient is agreeable. Patient reports being interested in gaining specifically housing related resources. [facility] LCSW provided patient with extensive education on behavioral health resources within her area and was agreeable to referrals for both psychiatry and  therapy to [facility]. Patient was also provided emotional support over the phone. She denies any SI/HI but admits she is in a stressful situation regarding her housing and recently left inpatient behavioral health. She was educated on her upcoming behavioral health appointments. She was educated on crisis resources as well and is aware of the 988 number. Patient declined wanting information on sober living options located in her area. Patient declined wanting a list of AA/NA meetings as well. Patient is agreeable to [facility] BSW following up with her on 03/04/23 to provide housing and financial resources.  Plan: Patient will contact [facility] to get connected to their case management program. Patient wrote this number physically down. Per [facility] program policy, patient will contact [facility] plan and benefits as instructed.  Plan: The Managed Medicaid care management team will reach out to the patient again over the next 30 days to ensure patient was successfully connected to [facility] benefits.  Date/time of next scheduled Social Work care management/care coordination outreach:  03/04/23 at 3 pm  Dickie La, BSW, MSW, [employer] Licensed Clinical Social Worker [employer]   [email or web address] Direct Dial: [phone]

## 2023-02-21 ENCOUNTER — Telehealth (HOSPITAL_COMMUNITY): Payer: Self-pay

## 2023-02-21 NOTE — Telephone Encounter (Signed)
Grace Montgomery answers the phone and this therapist confirms her identity by obtaining two identifiers.  Therapist explains who she is and her role and the services offered and asks [name] if she is interested or if she is already connected to services.  [name] says she is already receiving therapy from family services and is not interests in [place] as her schedule does not allow for that amount of time. She says she will walk in this Friday1 04-27-22 for medication management.  Remigio Eisenmenger, MS, LMFT, LCAS

## 2023-02-24 ENCOUNTER — Ambulatory Visit: Payer: Self-pay | Admitting: Family Medicine

## 2023-02-24 NOTE — Progress Notes (Deleted)
    SUBJECTIVE:   CHIEF COMPLAINT / HPI:   Hospitalized after attempted OD on alcohol and Cymbalta on 12/07-12/09. Admitted and discharged from [facility]. Here for follow-up.  PERTINENT  PMH / PSH: Asthma, T2DM, MDD recurrent severe, alcohol use disorder, anxiety & insomnia  OBJECTIVE:   There were no vitals taken for this visit.  ***  ASSESSMENT/PLAN:   Assessment & Plan  No follow-ups on file.  Celine Mans, MD [facility]

## 2023-03-04 ENCOUNTER — Ambulatory Visit: Payer: MEDICAID

## 2023-03-04 ENCOUNTER — Other Ambulatory Visit: Payer: Self-pay

## 2023-03-04 NOTE — Patient Instructions (Signed)
 Tailored Plan Medicaid On July 1, some people on [place] Medicaid will move to a new kind of Medicaid health plan called a Tailored Plan. Tailored Plans cover your doctor visits, prescription drugs, and health care services.    If your [place] Medicaid will move to a Tailored Plan, you should have gotten a letter and welcome packet. If you're not sure, call your [place] Medicaid Enrollment Broker at [phone] and ask.  Check out these free materials, in [place] and [place], to learn more about your Tailored Plan: [email or web address]/Tailored-Plans/Toolkit  Tailored Care Management Services  TCM services are available to you now. If you are a Tailored Plan member or will be and want information about Tailored Care Management Services including rides to appointments and community and home services, call the Care Management provider for your county of residence:    [facility] ([place], [place])  Member Services: [phone] Behavioral Health Crisis Line: 9081880558, [place], [place], [place], [place])  Member Services: [phone] Behavioral Health Crisis Line: [phone]  [facility] Renard Hamper) Member Services: [phone] Behavioral Health Crisis Line: [phone]

## 2023-03-04 NOTE — Patient Outreach (Signed)
Medicaid Managed Care Social Work Note  03/04/2023 Name:  Grace Montgomery MRN:  [id number] DOB:  Jul 14, 1990  Grace Montgomery is an 32 y.o. year old female who is a primary patient of McDiarmid, Leighton Roach, MD.  The Medicaid Managed Care Coordination team was consulted for assistance with:   housing  Grace Montgomery was given information about Medicaid Managed Care Coordination team services today. [name] Patient agreed to services and verbal consent obtained.  Engaged with patient  for by telephone forinitial visit in response to referral for case management and/or care coordination services.   Patient is participating in a Managed Medicaid Plan:  No  Assessments/Interventions:  Review of past medical history, allergies, medications, health status, including review of consultants reports, laboratory and other test data, was performed as part of comprehensive evaluation and provision of chronic care management services.  SDOH: (Social Drivers of Health) assessments and interventions performed: SDOH Interventions    Flowsheet Row Patient Outreach Telephone from 02/18/2023 in [place] [facility] POPULATION HEALTH DEPARTMENT Office Visit from 02/09/2023 in [facility] Of [place]. [facility] Telephone from 01/14/2022 in [facility] Community Care Coordination Office Visit from 04/17/2020 in [facility] - A Dept Of [place]. [facility]  SDOH Interventions      Housing Interventions Intervention Not Indicated  [Pt now at a boarding house but is at risk of losing housing. [facility] BSW referral made] -- Intervention Not Indicated --  Transportation Interventions Payor Benefit -- -- --  Depression Interventions/Treatment  Referral to Psychiatry Medication -- Counseling  Financial Strain Interventions Community Resources Provided  [Referral to [facility] BSW] -- Intervention Not Indicated --  Stress Interventions Offered [employer], Provide Counseling  [Referral made for [facility] and therapy appt made for [name] for 02/23/23] -- -- --      BSW completed a telephone outreach with patient, she states she needs assistance with paying her rent before her landlord files the eviction Patient states she was receiving disability for her daughter and is no longer receiving due to her daughter living with a friend. Patient has tried all resources BSW has for housing. Patient states she did reach out to [facility] and they were unable to assist. Patient states she is going to leave to avoid getting an eviction. Advanced Directives Status:  Not addressed in this encounter.  Care Plan                 Allergies  Allergen Reactions   Monistat [Tioconazole] Swelling and Other (See Comments)    Vaginal topical   Chocolate Itching and Other (See Comments)    Ears & throat itch   Ibuprofen Swelling and Other (See Comments)    Swelling of the feet   Banana Itching and Other (See Comments)    Throat itching    Medications Reviewed Today   Medications were not reviewed in this encounter     Patient Active Problem List   Diagnosis Date Noted   UTI (urinary tract infection) 02/15/2023   GAD (generalized anxiety disorder) 02/13/2023   Nicotine dependence 02/13/2023   Delta-9-tetrahydrocannabinol (THC) dependence (HCC) 02/13/2023   Cocaine use disorder (HCC) 02/13/2023   Alcohol use disorder 02/13/2023   Insomnia 02/13/2023   MDD (major depressive disorder), recurrent episode, severe (HCC) 02/12/2023   Cholecystitis 01/11/2022   Suicidal ideation 11/07/2020   Disorder of scalp 04/24/2020   Nail disorder  04/18/2020   Healthcare maintenance 04/18/2020   History of tobacco use 09/18/2018   MDD (major depressive disorder), recurrent severe, 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   Type 2 diabetes mellitus without complication, without long-term current use of insulin (HCC) 11/06/2016   Major depressive  disorder 04/26/2011    Class: Acute   Exercise-induced asthma Aug 11, 1990    Conditions to be addressed/monitored per PCP order:   housing  There are no care plans that you recently modified to display for this patient.   Follow up:  Patient agrees to Care Plan and Follow-up.  Plan: The  Patient has been provided with contact information for the Managed Medicaid care management team and has been advised to call with any health related questions or concerns.    Abelino Derrick, MHA [facility]  Managed [facility] Social Worker [phone]

## 2023-03-10 ENCOUNTER — Encounter (HOSPITAL_COMMUNITY): Payer: Self-pay

## 2023-03-10 ENCOUNTER — Ambulatory Visit (HOSPITAL_COMMUNITY)
Admission: EM | Admit: 2023-03-10 | Discharge: 2023-03-10 | Disposition: A | Discharge status: Home / Self Care | Payer: MEDICAID | Attending: Emergency Medicine | Admitting: Emergency Medicine

## 2023-03-10 DIAGNOSIS — Z202 Contact with and (suspected) exposure to infections with a predominantly sexual mode of transmission: Secondary | ICD-10-CM | POA: Diagnosis present

## 2023-03-10 DIAGNOSIS — N898 Other specified noninflammatory disorders of vagina: Secondary | ICD-10-CM | POA: Insufficient documentation

## 2023-03-10 MED ORDER — DOXYCYCLINE HYCLATE 100 MG PO CAPS: 100.0000 mg | ORAL_CAPSULE | Freq: Two times a day (BID) | ORAL | 0 refills | Status: AC

## 2023-03-10 MED ORDER — LIDOCAINE HCL (PF) 1 % IJ SOLN
INTRAMUSCULAR | Status: AC
  Filled 2023-03-10: qty 2

## 2023-03-10 MED ORDER — CEFTRIAXONE SODIUM 500 MG IJ SOLR
INTRAMUSCULAR | Status: AC
  Filled 2023-03-10: qty 500

## 2023-03-10 MED ORDER — CEFTRIAXONE SODIUM 500 MG IJ SOLR
500.0000 mg | INTRAMUSCULAR | Status: DC
  Administered 2023-03-10: 500 mg via INTRAMUSCULAR

## 2023-03-10 MED ORDER — FLUCONAZOLE 150 MG PO TABS
150.0000 mg | ORAL_TABLET | Freq: Once | ORAL | 0 refills | Status: DC | PRN
Start: 2023-03-10 — End: 2023-05-28

## 2023-03-10 NOTE — ED Triage Notes (Signed)
 Patient here today with c/o vaginal itching X 2 days. Patient was told by her partner that he had gonorrhea and chlamydia.

## 2023-03-10 NOTE — Discharge Instructions (Signed)
 The injection of Rocephin  is treating for gonorrhea The doxycyline by mouth is treating for chlamydia Please take full course as prescribed. Take with food to avoid upset stomach.  I have also sent fluconazole  to prevent yeast infection  Abstain from intercourse for 7 days after completing treatment.  We will call you if anything on swab requires treatment change
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
## 2023-03-11 LAB — CERVICOVAGINAL ANCILLARY ONLY
Bacterial Vaginitis (gardnerella): NEGATIVE
Candida Glabrata: NEGATIVE
Candida Vaginitis: NEGATIVE
Chlamydia: NEGATIVE
Comment: NEGATIVE
Comment: NEGATIVE
Comment: NEGATIVE
Comment: NEGATIVE
Comment: NEGATIVE
Comment: NORMAL
Neisseria Gonorrhea: NEGATIVE
Trichomonas: NEGATIVE

## 2023-03-16 ENCOUNTER — Other Ambulatory Visit: Payer: Self-pay

## 2023-03-16 ENCOUNTER — Emergency Department (HOSPITAL_COMMUNITY)
Admission: EM | Admit: 2023-03-16 | Discharge: 2023-03-16 | Discharge status: Against Medical Advice | Payer: MEDICAID | Attending: Emergency Medicine | Admitting: Emergency Medicine

## 2023-03-16 DIAGNOSIS — R4689 Other symptoms and signs involving appearance and behavior: Secondary | ICD-10-CM | POA: Diagnosis present

## 2023-03-16 DIAGNOSIS — Z5321 Procedure and treatment not carried out due to patient leaving prior to being seen by health care provider: Secondary | ICD-10-CM | POA: Diagnosis not present

## 2023-03-16 DIAGNOSIS — R45851 Suicidal ideations: Secondary | ICD-10-CM | POA: Insufficient documentation

## 2023-03-16 NOTE — ED Triage Notes (Signed)
 Pt BIB [facility]. C/o SI. States they "have a lot going on".

## 2023-03-16 NOTE — ED Notes (Signed)
 Patient states she is no longer suicidal and would like to leave now. She is currently refusing all services.

## 2023-04-28 ENCOUNTER — Emergency Department (HOSPITAL_COMMUNITY)
Admission: EM | Admit: 2023-04-28 | Discharge: 2023-04-28 | Discharge status: Against Medical Advice | Payer: MEDICAID | Attending: Student | Admitting: Student

## 2023-04-28 DIAGNOSIS — F1090 Alcohol use, unspecified, uncomplicated: Secondary | ICD-10-CM | POA: Insufficient documentation

## 2023-04-28 DIAGNOSIS — Z5321 Procedure and treatment not carried out due to patient leaving prior to being seen by health care provider: Secondary | ICD-10-CM | POA: Diagnosis not present

## 2023-04-28 DIAGNOSIS — R531 Weakness: Secondary | ICD-10-CM
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
## 2023-04-28 NOTE — ED Notes (Signed)
 Pt arrived and told staff that she was leaving. Pt got out of the bed and crawled to the door. Staff helped pt get back up and into the bed. Pt decided to get back up and walk out the hospital because staff would not provide her a sprite. Deon Pilling, RN stated "you have to see the doctor before we provide anything". Pt stated "well I'll go get my own then". RN tried to persuade pt to stay and to see the doctor. Pt continued to leave and ambulated to the lobby. Pt was alert and oriented at the time of elopement.

## 2023-04-28 NOTE — ED Provider Notes (Signed)
 Patient eloped from the emergency department prior to my evaluation.   Glendora Score, MD 04/28/23 [phone]

## 2023-05-11 ENCOUNTER — Emergency Department (HOSPITAL_COMMUNITY): Payer: MEDICAID

## 2023-05-11 ENCOUNTER — Other Ambulatory Visit: Payer: Self-pay

## 2023-05-11 ENCOUNTER — Encounter (HOSPITAL_COMMUNITY): Payer: Self-pay

## 2023-05-11 ENCOUNTER — Emergency Department (HOSPITAL_COMMUNITY)
Admission: EM | Admit: 2023-05-11 | Discharge: 2023-05-12 | Disposition: A | Discharge status: Home / Self Care | Payer: MEDICAID | Attending: Emergency Medicine | Admitting: Emergency Medicine

## 2023-05-11 DIAGNOSIS — Z7951 Long term (current) use of inhaled steroids: Secondary | ICD-10-CM | POA: Diagnosis not present

## 2023-05-11 DIAGNOSIS — J45909 Unspecified asthma, uncomplicated: Secondary | ICD-10-CM | POA: Insufficient documentation

## 2023-05-11 DIAGNOSIS — Z79899 Other long term (current) drug therapy: Secondary | ICD-10-CM | POA: Diagnosis not present

## 2023-05-11 DIAGNOSIS — E119 Type 2 diabetes mellitus without complications: Secondary | ICD-10-CM | POA: Insufficient documentation

## 2023-05-11 DIAGNOSIS — D649 Anemia, unspecified: Secondary | ICD-10-CM | POA: Diagnosis not present

## 2023-05-11 DIAGNOSIS — A084 Viral intestinal infection, unspecified: Secondary | ICD-10-CM | POA: Insufficient documentation

## 2023-05-11 DIAGNOSIS — F332 Major depressive disorder, recurrent severe without psychotic features: Secondary | ICD-10-CM | POA: Diagnosis present

## 2023-05-11 DIAGNOSIS — R112 Nausea with vomiting, unspecified: Secondary | ICD-10-CM | POA: Diagnosis present

## 2023-05-11 DIAGNOSIS — R45851 Suicidal ideations: Secondary | ICD-10-CM | POA: Diagnosis not present

## 2023-05-11 DIAGNOSIS — E871 Hypo-osmolality and hyponatremia: Secondary | ICD-10-CM | POA: Diagnosis not present

## 2023-05-11 DIAGNOSIS — E1165 Type 2 diabetes mellitus with hyperglycemia: Secondary | ICD-10-CM | POA: Diagnosis not present

## 2023-05-11 DIAGNOSIS — Z7984 Long term (current) use of oral hypoglycemic drugs: Secondary | ICD-10-CM | POA: Diagnosis not present

## 2023-05-11 DIAGNOSIS — F411 Generalized anxiety disorder: Secondary | ICD-10-CM | POA: Diagnosis not present

## 2023-05-11 LAB — COMPREHENSIVE METABOLIC PANEL
ALT: 10 U/L (ref 0–44)
AST: 16 U/L (ref 15–41)
Albumin: 3.3 g/dL — ABNORMAL LOW (ref 3.5–5.0)
Alkaline Phosphatase: 52 U/L (ref 38–126)
Anion gap: 10 (ref 5–15)
BUN: 10 mg/dL (ref 6–20)
CO2: 24 mmol/L (ref 22–32)
Calcium: 9.5 mg/dL (ref 8.9–10.3)
Chloride: 102 mmol/L (ref 98–111)
Creatinine, Ser: 0.78 mg/dL (ref 0.44–1.00)
GFR, Estimated: >60 mL/min (ref >60)
Glucose, Bld: 140 mg/dL — ABNORMAL HIGH (ref 70–99)
Potassium: 3.9 mmol/L (ref 3.5–5.1)
Sodium: 136 mmol/L (ref 135–145)
Total Bilirubin: 0.4 mg/dL (ref 0.0–1.2)
Total Protein: 6.9 g/dL (ref 6.5–8.1)

## 2023-05-11 LAB — LIPASE, BLOOD: Lipase: 39 U/L (ref 11–51)

## 2023-05-11 LAB — URINALYSIS, ROUTINE W REFLEX MICROSCOPIC
Bacteria, UA: NONE SEEN
Bilirubin Urine: NEGATIVE
Glucose, UA: 150 mg/dL — AB
Hgb urine dipstick: NEGATIVE
Ketones, ur: NEGATIVE mg/dL
Leukocytes,Ua: NEGATIVE
Nitrite: NEGATIVE
Protein, ur: NEGATIVE mg/dL
Specific Gravity, Urine: 1.018 (ref 1.005–1.030)
pH: 7 (ref 5.0–8.0)

## 2023-05-11 LAB — RESP PANEL BY RT-PCR (RSV, FLU A&B, COVID)  RVPGX2
Influenza A by PCR: NEGATIVE
Influenza B by PCR: NEGATIVE
Resp Syncytial Virus by PCR: NEGATIVE
SARS Coronavirus 2 by RT PCR: NEGATIVE

## 2023-05-11 LAB — CBC WITH DIFFERENTIAL/PLATELET
Abs Immature Granulocytes: 0.02 10*3/uL (ref 0.00–0.07)
Basophils Absolute: 0.1 10*3/uL (ref 0.0–0.1)
Basophils Relative: 1 %
Eosinophils Absolute: 0.1 10*3/uL (ref 0.0–0.5)
Eosinophils Relative: 2 %
HCT: 32.9 % — ABNORMAL LOW (ref 36.0–46.0)
Hemoglobin: 10.6 g/dL — ABNORMAL LOW (ref 12.0–15.0)
Immature Granulocytes: 0 %
Lymphocytes Relative: 38 %
Lymphs Abs: 3 10*3/uL (ref 0.7–4.0)
MCH: 29.4 pg (ref 26.0–34.0)
MCHC: 32.2 g/dL (ref 30.0–36.0)
MCV: 91.1 fL (ref 80.0–100.0)
Monocytes Absolute: 0.5 10*3/uL (ref 0.1–1.0)
Monocytes Relative: 7 %
Neutro Abs: 4.1 10*3/uL (ref 1.7–7.7)
Neutrophils Relative %: 52 %
Platelets: 349 10*3/uL (ref 150–400)
RBC: 3.61 MIL/uL — ABNORMAL LOW (ref 3.87–5.11)
RDW: 15.9 % — ABNORMAL HIGH (ref 11.5–15.5)
WBC: 7.9 10*3/uL (ref 4.0–10.5)
nRBC: 0 % (ref 0.0–0.2)

## 2023-05-11 LAB — PREGNANCY, URINE: Preg Test, Ur: NEGATIVE

## 2023-05-11 LAB — TROPONIN I (HIGH SENSITIVITY): Troponin I (High Sensitivity): <2 ng/L (ref <18)

## 2023-05-11 MED ORDER — ONDANSETRON 4 MG PO TBDP
4.0000 mg | ORAL_TABLET | Freq: Once | ORAL | Status: AC
  Administered 2023-05-11: 4 mg via ORAL
  Filled 2023-05-11: qty 1

## 2023-05-11 NOTE — ED Triage Notes (Signed)
 Pt reports intermittent midsternal chest pain x 2 days with nausea and vomiting. Pt also has cough and congestion x 1 day.

## 2023-05-11 NOTE — ED Provider Triage Note (Signed)
 Emergency Medicine Provider Triage Evaluation Note  Grace Montgomery , a 33 y.o. female  was evaluated in triage.  Pt complains of nausea, vomiting, cramping abdominal pain.  Symptoms began earlier today.  Her boyfriend has the same symptoms.  They ate at the same taco truck yesterday evening for dinner.  Report some chest pain shortness of breath as well.  Review of Systems  Positive: As above Negative: As above  Physical Exam  BP 112/82 (BP Location: Right Arm)   Pulse (!) 103   Temp 98.9 F (37.2 C)   Resp 18   SpO2 100%  Gen:   Awake, no distress   Resp:  Normal effort  MSK:   Moves extremities without difficulty  Other:    Medical Decision Making  Medically screening exam initiated at 9:32 PM.  Appropriate orders placed.  Grace Montgomery was informed that the remainder of the evaluation will be completed by another provider, this initial triage assessment does not replace that evaluation, and the importance of remaining in the ED until their evaluation is complete.  Workup initiated   Mora Bellman 05/11/23 2132

## 2023-05-12 ENCOUNTER — Emergency Department (EMERGENCY_DEPARTMENT_HOSPITAL)
Admission: EM | Admit: 2023-05-12 | Discharge: 2023-05-13 | Disposition: A | Discharge status: Other Acute Inpatient Hospital | Payer: MEDICAID | Source: Home / Self Care

## 2023-05-12 DIAGNOSIS — F411 Generalized anxiety disorder: Secondary | ICD-10-CM | POA: Insufficient documentation

## 2023-05-12 DIAGNOSIS — D649 Anemia, unspecified: Secondary | ICD-10-CM | POA: Insufficient documentation

## 2023-05-12 DIAGNOSIS — F332 Major depressive disorder, recurrent severe without psychotic features: Secondary | ICD-10-CM | POA: Diagnosis present

## 2023-05-12 DIAGNOSIS — R45851 Suicidal ideations: Secondary | ICD-10-CM | POA: Insufficient documentation

## 2023-05-12 DIAGNOSIS — J45909 Unspecified asthma, uncomplicated: Secondary | ICD-10-CM | POA: Insufficient documentation

## 2023-05-12 DIAGNOSIS — Z7951 Long term (current) use of inhaled steroids: Secondary | ICD-10-CM | POA: Insufficient documentation

## 2023-05-12 DIAGNOSIS — Z79899 Other long term (current) drug therapy: Secondary | ICD-10-CM | POA: Insufficient documentation

## 2023-05-12 DIAGNOSIS — E1165 Type 2 diabetes mellitus with hyperglycemia: Secondary | ICD-10-CM | POA: Insufficient documentation

## 2023-05-12 DIAGNOSIS — F419 Anxiety disorder, unspecified: Secondary | ICD-10-CM | POA: Insufficient documentation

## 2023-05-12 DIAGNOSIS — Z7984 Long term (current) use of oral hypoglycemic drugs: Secondary | ICD-10-CM | POA: Insufficient documentation

## 2023-05-12 DIAGNOSIS — E871 Hypo-osmolality and hyponatremia: Secondary | ICD-10-CM | POA: Insufficient documentation

## 2023-05-12 LAB — CBC WITH DIFFERENTIAL/PLATELET
Abs Immature Granulocytes: 0.01 10*3/uL (ref 0.00–0.07)
Basophils Absolute: 0 10*3/uL (ref 0.0–0.1)
Basophils Relative: 1 %
Eosinophils Absolute: 0.1 10*3/uL (ref 0.0–0.5)
Eosinophils Relative: 2 %
HCT: 36.3 % (ref 36.0–46.0)
Hemoglobin: 11.3 g/dL — ABNORMAL LOW (ref 12.0–15.0)
Immature Granulocytes: 0 %
Lymphocytes Relative: 36 %
Lymphs Abs: 2 10*3/uL (ref 0.7–4.0)
MCH: 29.2 pg (ref 26.0–34.0)
MCHC: 31.1 g/dL (ref 30.0–36.0)
MCV: 93.8 fL (ref 80.0–100.0)
Monocytes Absolute: 0.3 10*3/uL (ref 0.1–1.0)
Monocytes Relative: 6 %
Neutro Abs: 3.2 10*3/uL (ref 1.7–7.7)
Neutrophils Relative %: 55 %
Platelets: 382 10*3/uL (ref 150–400)
RBC: 3.87 MIL/uL (ref 3.87–5.11)
RDW: 16.1 % — ABNORMAL HIGH (ref 11.5–15.5)
WBC: 5.6 10*3/uL (ref 4.0–10.5)
nRBC: 0 % (ref 0.0–0.2)

## 2023-05-12 LAB — COMPREHENSIVE METABOLIC PANEL
ALT: 10 U/L (ref 0–44)
AST: 16 U/L (ref 15–41)
Albumin: 3.9 g/dL (ref 3.5–5.0)
Alkaline Phosphatase: 67 U/L (ref 38–126)
Anion gap: 8 (ref 5–15)
BUN: 11 mg/dL (ref 6–20)
CO2: 23 mmol/L (ref 22–32)
Calcium: 9.5 mg/dL (ref 8.9–10.3)
Chloride: 103 mmol/L (ref 98–111)
Creatinine, Ser: 0.77 mg/dL (ref 0.44–1.00)
GFR, Estimated: >60 mL/min (ref >60)
Glucose, Bld: 116 mg/dL — ABNORMAL HIGH (ref 70–99)
Potassium: 3.6 mmol/L (ref 3.5–5.1)
Sodium: 134 mmol/L — ABNORMAL LOW (ref 135–145)
Total Bilirubin: 0.5 mg/dL (ref 0.0–1.2)
Total Protein: 8.4 g/dL — ABNORMAL HIGH (ref 6.5–8.1)

## 2023-05-12 LAB — RAPID URINE DRUG SCREEN, HOSP PERFORMED
Amphetamines: NOT DETECTED
Barbiturates: NOT DETECTED
Benzodiazepines: NOT DETECTED
Cocaine: NOT DETECTED
Opiates: NOT DETECTED
Tetrahydrocannabinol: POSITIVE — AB

## 2023-05-12 LAB — ETHANOL: Alcohol, Ethyl (B): <10 mg/dL (ref <10)

## 2023-05-12 LAB — CBG MONITORING, ED: Glucose-Capillary: 127 mg/dL — ABNORMAL HIGH (ref 70–99)

## 2023-05-12 LAB — HCG, SERUM, QUALITATIVE: Preg, Serum: NEGATIVE

## 2023-05-12 LAB — TROPONIN I (HIGH SENSITIVITY): Troponin I (High Sensitivity): <2 ng/L (ref <18)

## 2023-05-12 MED ORDER — PANTOPRAZOLE SODIUM 20 MG PO TBEC
20.0000 mg | DELAYED_RELEASE_TABLET | Freq: Once | ORAL | Status: AC
  Administered 2023-05-12: 20 mg via ORAL
  Filled 2023-05-12: qty 1

## 2023-05-12 MED ORDER — ONDANSETRON HCL 4 MG PO TABS: 4.0000 mg | ORAL_TABLET | Freq: Four times a day (QID) | ORAL | 0 refills | Status: DC

## 2023-05-12 MED ORDER — PANTOPRAZOLE SODIUM 40 MG IV SOLR
40.0000 mg | Freq: Once | INTRAVENOUS | Status: DC
Start: 2023-05-12 — End: 2023-05-12
  Filled 2023-05-12: qty 10

## 2023-05-12 MED ORDER — ACETAMINOPHEN 325 MG PO TABS
650.0000 mg | ORAL_TABLET | Freq: Once | ORAL | Status: AC
  Administered 2023-05-12: 650 mg via ORAL
  Filled 2023-05-12: qty 2

## 2023-05-12 MED ORDER — HYDROXYZINE HCL 25 MG PO TABS: 25.0000 mg | ORAL_TABLET | Freq: Three times a day (TID) | ORAL | Status: DC | PRN

## 2023-05-12 MED ORDER — METFORMIN HCL 500 MG PO TABS
1000.0000 mg | ORAL_TABLET | Freq: Two times a day (BID) | ORAL | Status: DC
  Administered 2023-05-12 – 2023-05-13 (×2): 1000 mg via ORAL
  Filled 2023-05-12 (×2): qty 2

## 2023-05-12 MED ORDER — SERTRALINE HCL 50 MG PO TABS
25.0000 mg | ORAL_TABLET | Freq: Every day | ORAL | Status: DC
  Administered 2023-05-12 – 2023-05-13 (×2): 25 mg via ORAL
  Filled 2023-05-12 (×2): qty 1
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
## 2023-05-12 NOTE — Progress Notes (Signed)
 Patient has been denied by [facility] due to no appropriate beds available. Patient meets [facility] inpatient criteria per Alona Bene, PMHNP. Patient has been faxed out to the following facilities:   [facility] [address] [place] [place] [address] [phone]  [facility] [address], [place] [address] [phone]  [facility] Patient Placement [facility], [place] [phone]  [facility] [address] [place] [address] [phone]  [facility] [address] [place] [address] [phone]  [facility] [address] [place] [address] [phone]  [facility] [address], [place] [address] [phone]  [facility] [address] [place] [address] [phone]  [facility] [address] [place] [address] [phone]  [facility] EFAX [address] [place] [phone]  [facility] [address] [place] [address] [phone]  [facility] [address] [address] [phone]  [facility] [address] [place] [address] [phone]  [facility] [address] [place] [address] [phone]  [facility] [address] [place] [address] [phone]  [facility] [address], [place] [address] [phone]   [name], MSW, LCSW-A  6:55 PM 05/12/2023

## 2023-05-12 NOTE — Discharge Instructions (Addendum)
 Please follow-up with your primary care provider in regards to symptoms and ER visit. Today your labs are reassuring you most likely a viral illness. Please remain hydrated and eat food as tolerated over the next few days.  You may consider a brat diet that includes bananas, rice, applesauce, toast. I prescribe Zofran to help with your nausea. You may use Tylenol every 6 hours as needed for pain. If symptoms change or worsen please return to the ER.

## 2023-05-12 NOTE — ED Notes (Signed)
Pt was given a breakfast tray 

## 2023-05-12 NOTE — ED Provider Notes (Signed)
 [place] EMERGENCY DEPARTMENT AT [facility] Provider Note   CSN: [id number] Arrival date & time: 05/11/23  2115     History  Chief Complaint  Patient presents with   Chest Pain    Grace Montgomery is a 33 y.o. female history of asthma, diabetes presented with 1 day of nausea vomiting.  Patient states that her significant other is sick with similar symptoms.  Patient also endorses cough congestion for 1 day but denies shortness of breath.  Patient states after vomiting she had some chest discomfort which was a burning sensation.  Patient is unsure of fevers.    Home Medications Prior to Admission medications   Medication Sig Start Date End Date Taking? Authorizing Provider  ondansetron (ZOFRAN) 4 MG tablet Take 1 tablet (4 mg total) by mouth every 6 (six) hours. 05/12/23  Yes Jafet Wissing, Beverly Gust, PA-C  albuterol (VENTOLIN HFA) 108 (90 Base) MCG/ACT inhaler Inhale 2 puffs into the lungs every 6 (six) hours as needed for wheezing or shortness of breath. 02/17/23   Ntuen, Jesusita Oka, FNP  fluconazole (DIFLUCAN) 150 MG tablet Take 1 tablet (150 mg total) by mouth once as needed for up to 2 doses (take one pill on day 1, and the second pill 3 days later). 03/10/23   Rising, Lurena Joiner, PA-C  hydrOXYzine (ATARAX) 25 MG tablet Take 1 tablet (25 mg total) by mouth 3 (three) times daily as needed for anxiety. 02/17/23   Ntuen, Jesusita Oka, FNP  metFORMIN (GLUCOPHAGE) 1000 MG tablet Take 1 tablet (1,000 mg total) by mouth 2 (two) times daily with a meal for 180 doses. 02/17/23 05/18/23  Cecilie Lowers, FNP  Multiple Vitamin (MULTIVITAMIN WITH MINERALS) TABS tablet Take 1 tablet by mouth daily. 02/18/23   Cecilie Lowers, FNP      Allergies    Monistat [tioconazole], Chocolate, Ibuprofen, and Banana    Review of Systems   Review of Systems  Cardiovascular:  Positive for chest pain.    Physical Exam Updated Vital Signs BP 109/64 (BP Location: Left Arm)   Pulse 92   Temp 98 F (36.7 C)   Resp 18    LMP 04/18/2023 (Exact Date)   SpO2 100%  Physical Exam Vitals reviewed.  Constitutional:      General: She is not in acute distress. HENT:     Head: Normocephalic and atraumatic.  Eyes:     Extraocular Movements: Extraocular movements intact.     Conjunctiva/sclera: Conjunctivae normal.     Pupils: Pupils are equal, round, and reactive to light.  Cardiovascular:     Rate and Rhythm: Normal rate and regular rhythm.     Pulses: Normal pulses.     Heart sounds: Normal heart sounds.     Comments: 2+ bilateral radial/dorsalis pedis pulses with regular rate Pulmonary:     Effort: Pulmonary effort is normal. No respiratory distress.     Breath sounds: Normal breath sounds.  Abdominal:     Palpations: Abdomen is soft.     Tenderness: There is no abdominal tenderness. There is no guarding or rebound.  Musculoskeletal:        General: Normal range of motion.     Cervical back: Normal range of motion and neck supple.     Comments: 5 out of 5 bilateral grip/leg extension strength  Skin:    General: Skin is warm and dry.     Capillary Refill: Capillary refill takes less than 2 seconds.  Neurological:  General: No focal deficit present.     Mental Status: She is alert and oriented to person, place, and time.     Comments: Sensation intact in all 4 limbs  Psychiatric:        Mood and Affect: Mood normal.     ED Results / Procedures / Treatments   Labs (all labs ordered are listed, but only abnormal results are displayed) Labs Reviewed  CBC WITH DIFFERENTIAL/PLATELET - Abnormal; Notable for the following components:      Result Value   RBC 3.61 (*)    Hemoglobin 10.6 (*)    HCT 32.9 (*)    RDW 15.9 (*)    All other components within normal limits  COMPREHENSIVE METABOLIC PANEL - Abnormal; Notable for the following components:   Glucose, Bld 140 (*)    Albumin 3.3 (*)    All other components within normal limits  URINALYSIS, ROUTINE W REFLEX MICROSCOPIC - Abnormal; Notable for  the following components:   APPearance HAZY (*)    Glucose, UA 150 (*)    All other components within normal limits  RESP PANEL BY RT-PCR (RSV, FLU A&B, COVID)  RVPGX2  LIPASE, BLOOD  PREGNANCY, URINE  TROPONIN I (HIGH SENSITIVITY)  TROPONIN I (HIGH SENSITIVITY)    EKG None  Radiology DG Chest 1 View Result Date: 05/11/2023 CLINICAL DATA:  Chest pain and nausea with fever and chills. EXAM: CHEST  1 VIEW COMPARISON:  January 18, 2023 FINDINGS: The heart size and mediastinal contours are within normal limits. Both lungs are clear. The visualized skeletal structures are unremarkable. IMPRESSION: No active disease. Electronically Signed   By: Aram Candela M.D.   On: 05/11/2023 23:56    Procedures Procedures    Medications Ordered in ED Medications  pantoprazole (PROTONIX) injection 40 mg (has no administration in time range)  ondansetron (ZOFRAN-ODT) disintegrating tablet 4 mg (4 mg Oral Given 05/11/23 2143)    ED Course/ Medical Decision Making/ A&P                                 Medical Decision Making Risk Prescription drug management.   Grace Montgomery 33 y.o. presented today for abdominal pain.  Working DDx that I considered at this time includes, but not limited to, gastroenteritis, colitis, small bowel obstruction, appendicitis, cholecystitis, hepatobiliary pathology, gastritis, PUD, ACS, aortic dissection, diverticulosis/diverticulitis, pancreatitis, nephrolithiasis, medication induced, AAA, UTI, pyelonephritis, ruptured ectopic pregnancy, PID, ovarian torsion, ACS, PE, dissection.  R/o DDx: colitis, small bowel obstruction, appendicitis, cholecystitis, hepatobiliary pathology, gastritis, PUD, ACS, aortic dissection, diverticulosis/diverticulitis, pancreatitis, nephrolithiasis, medication induced, AAA, UTI, pyelonephritis, ruptured ectopic pregnancy, PID, ovarian torsion, ACS, PE, dissection: These are considered less likely due to history of present illness,  physical exam, labs/imaging findings.  Review of prior external notes: 02/18/2023 patient reach telephone  Unique Tests and My Independent Interpretation:  CBC with differential: Unremarkable CMP: Unremarkable Lipase: Unremarkable UA: Unremarkable Urine Pregnancy: Unremarkable EKG: Rate, rhythm, axis, intervals all examined and without medically relevant abnormality. ST segments without concerns for elevations Chest x-ray: No acute findings Troponin: Less than 2, less than 2 Respiratory panel: Negative  Social Determinants of Health: none  Discussion with Independent Historian: None  Discussion of Management of Tests: None  Risk: Medium: prescription drug management  Risk Stratification Score: None  Plan: On exam patient was in no acute distress stable vitals.  Patient's physical exam is unremarkable including no abdominal tenderness or peritoneal  signs.  Labs in triage are reassuring along with imaging.  Do feel patient has a viral gastroenteritis given that triage note states that her and her partner ate from the same taco truck last night and shortly after her symptoms began partner is currently being seen for same symptoms.  Will give Protonix here to help patient's stomach and p.o. challenge and if successful will discharge with Zofran as patient states that Zofran from triage helped immensely.  Patient was given return precautions. Patient stable for discharge at this time.  Patient verbalized understanding of plan.  This chart was dictated using voice recognition software.  Despite best efforts to proofread,  errors can occur which can change the documentation meaning.        Final Clinical Impression(s) / ED Diagnoses Final diagnoses:  Viral gastroenteritis    Rx / DC Orders ED Discharge Orders          Ordered    ondansetron (ZOFRAN) 4 MG tablet  Every 6 hours        05/12/23 0250              Netta Corrigan, PA-C 05/12/23 0328    Tilden Fossa, MD 05/12/23 [phone]

## 2023-05-12 NOTE — ED Provider Notes (Signed)
  EMERGENCY DEPARTMENT AT [facility] Provider Note   CSN: [id number] Arrival date & time: 05/12/23  3086     History No chief complaint on file.   Grace Montgomery is a 33 y.o. female.  The history is provided by the patient.     Patient presents to the ED today complaining of SI with plan.  States that she will "take all the pills she has" referring to her duloxetine when further questioned.  Previous medical history of type 2 diabetes, polysubstance use, bipolar disorder, SI with plan, MDD, GAD.  Reports having started to feel this way after she had been kicked out of her residence 2 weeks ago.  Is wishing to speak to psychiatry here today.  Reports having stopped taking both her duloxetine and hydroxyzine 2 weeks ago.  Has not taken her metformin in 1 week.  Reports feeling some generalized bodyaches after being seen yesterday for viral gastroenteritis.  Saying that she has been able to tolerate p.o. intake with Zofran.  Symptoms have been improving.  Denies fever, cough, ingestion, chest pain, shortness of breath, abdominal pain, vomiting, lower extremity swelling.  Home Medications Prior to Admission medications   Medication Sig Start Date End Date Taking? Authorizing Provider  albuterol (VENTOLIN HFA) 108 (90 Base) MCG/ACT inhaler Inhale 2 puffs into the lungs every 6 (six) hours as needed for wheezing or shortness of breath. 02/17/23   Ntuen, Jesusita Oka, FNP  fluconazole (DIFLUCAN) 150 MG tablet Take 1 tablet (150 mg total) by mouth once as needed for up to 2 doses (take one pill on day 1, and the second pill 3 days later). 03/10/23   Rising, Lurena Joiner, PA-C  hydrOXYzine (ATARAX) 25 MG tablet Take 1 tablet (25 mg total) by mouth 3 (three) times daily as needed for anxiety. 02/17/23   Ntuen, Jesusita Oka, FNP  metFORMIN (GLUCOPHAGE) 1000 MG tablet Take 1 tablet (1,000 mg total) by mouth 2 (two) times daily with a meal for 180 doses. 02/17/23 05/18/23  Cecilie Lowers, FNP   Multiple Vitamin (MULTIVITAMIN WITH MINERALS) TABS tablet Take 1 tablet by mouth daily. 02/18/23   Ntuen, Jesusita Oka, FNP  ondansetron (ZOFRAN) 4 MG tablet Take 1 tablet (4 mg total) by mouth every 6 (six) hours. 05/12/23   Netta Corrigan, PA-C      Allergies    Monistat [tioconazole], Chocolate, Ibuprofen, and Banana    Review of Systems   Review of Systems  Psychiatric/Behavioral:  Positive for suicidal ideas.   All other systems reviewed and are negative.   Physical Exam Updated Vital Signs BP 132/65 (BP Location: Right Arm)   Pulse 100   Temp 98.6 F (37 C) (Oral)   Resp 16   LMP 04/18/2023 (Exact Date)   SpO2 100%  Physical Exam Vitals and nursing note reviewed.  Constitutional:      General: She is not in acute distress.    Appearance: Normal appearance. She is not ill-appearing.  HENT:     Head: Normocephalic and atraumatic.  Eyes:     General: No scleral icterus.       Right eye: No discharge.        Left eye: No discharge.     Extraocular Movements: Extraocular movements intact.     Conjunctiva/sclera: Conjunctivae normal.  Cardiovascular:     Rate and Rhythm: Normal rate and regular rhythm.     Pulses: Normal pulses.     Heart sounds: Normal heart sounds. No murmur heard.  No friction rub. No gallop.  Pulmonary:     Effort: Pulmonary effort is normal. No respiratory distress.     Breath sounds: Normal breath sounds. No stridor. No wheezing, rhonchi or rales.  Abdominal:     General: Abdomen is flat.     Palpations: Abdomen is soft.     Tenderness: There is no abdominal tenderness.  Musculoskeletal:     Right lower leg: No edema.     Left lower leg: No edema.  Skin:    General: Skin is warm and dry.     Coloration: Skin is not pale.     Findings: No bruising or erythema.  Neurological:     General: No focal deficit present.     Mental Status: She is alert and oriented to person, place, and time. Mental status is at baseline.     Motor: No weakness.      Gait: Gait normal.  Psychiatric:        Mood and Affect: Mood normal.     ED Results / Procedures / Treatments   Labs (all labs ordered are listed, but only abnormal results are displayed) Labs Reviewed  COMPREHENSIVE METABOLIC PANEL - Abnormal; Notable for the following components:      Result Value   Sodium 134 (*)    Glucose, Bld 116 (*)    Total Protein 8.4 (*)    All other components within normal limits  RAPID URINE DRUG SCREEN, HOSP PERFORMED - Abnormal; Notable for the following components:   Tetrahydrocannabinol POSITIVE (*)    All other components within normal limits  CBC WITH DIFFERENTIAL/PLATELET - Abnormal; Notable for the following components:   Hemoglobin 11.3 (*)    RDW 16.1 (*)    All other components within normal limits  CBG MONITORING, ED - Abnormal; Notable for the following components:   Glucose-Capillary 127 (*)    All other components within normal limits  ETHANOL  HCG, SERUM, QUALITATIVE    EKG None  Radiology DG Chest 1 View Result Date: 05/11/2023 CLINICAL DATA:  Chest pain and nausea with fever and chills. EXAM: CHEST  1 VIEW COMPARISON:  January 18, 2023 FINDINGS: The heart size and mediastinal contours are within normal limits. Both lungs are clear. The visualized skeletal structures are unremarkable. IMPRESSION: No active disease. Electronically Signed   By: Aram Candela M.D.   On: 05/11/2023 23:56    Procedures Procedures   Medications Ordered in ED Medications - No data to display  ED Course/ Medical Decision Making/ A&P    Medical Decision Making Amount and/or Complexity of Data Reviewed Labs: ordered.     Patient presents to the ED today complaining of SI with plan.  States that she will "take all the pills she has" referring to her duloxetine when further questioned.  Here voluntarily.  Previous medical history of type 2 diabetes, polysubstance use, bipolar disorder, SI with plan, MDD, GAD.  Reports having started to  feel this way after she had been kicked out of her residence 2 weeks ago.  Noted to be "living on the streets."  Is wishing to speak to psychiatry here today.  Reports having stopped taking both her duloxetine and hydroxyzine 2 weeks ago.  Has not taken her metformin in 1 week.  Reports feeling some generalized bodyaches after being seen yesterday for viral gastroenteritis.  Saying that she has been able to tolerate p.o. intake with Zofran.  Physical exam is unremarkable with patient being in no acute distress, not hypoxic,  alert and oriented x 4, afebrile.  No abdominal tenderness.  LCTAB.  RRR with no M/G/R/C.  Med clearance labs were done which showed positive THC use on UDS.  However was unremarkable otherwise.  Viral gastritis symptoms seem to be improving.  No further workup or treatment is required for this.  With patient symptoms, I believe that she would benefit from TTS consult and evaluation for SI with plan.  Low suspicion for any other emergent pathology present at this time.  Patient is medically cleared at this time to be evaluated by TTS.  Differential diagnoses prior to evaluation: The emergent differential diagnosis includes, but is not limited to, bipolar with manic episode, anxiety, substance abuse, malingering, diabetes complication including DKA, hyperglycemia, delirium, metabolic disturbance. This is not an exhaustive differential.   Past Medical History / Co-morbidities / Social History: Asthma, type 2 diabetes, MDD, SI, GAD, THC dependence, cocaine use, alcohol use disorder, insomnia  Additional history: Chart reviewed. Pertinent results include: Last seen for SI on 02/11/2023.  Noted to have a previous plan of drinking alcohol to kill herself.  Stating that she will "be dead by 05-31-23". Voluntary commitment.  Lab Tests/Imaging studies: I personally interpreted labs/imaging and the pertinent results include:   CBC was notable for a mild anemia with hemoglobin 11.3 increased  from 10.6 from previous. CMP shows a mild hyponatremia of 134, mildly elevated glucose of 116 Alcohol unremarkable hCG qualitative negative UDS positive for THC  Medications: No medications required for this visit.  I have reviewed the patients home medicines and have made adjustments as needed.  Disposition: After consideration of the diagnostic results and the patients response to treatment, I feel that the patient benefit from TTS consult and evaluation   Final Clinical Impression(s) / ED Diagnoses Final diagnoses:  Suicidal ideation    Rx / DC Orders ED Discharge Orders     None         Lunette Stands, PA-C 05/12/23 1045    Coral Spikes, DO 05/12/23 1601

## 2023-05-12 NOTE — ED Notes (Signed)
 Blood work done and urine.  Gave pt a breakfast tray

## 2023-05-12 NOTE — ED Triage Notes (Signed)
 Pt arrived endorses suicidal thoughts and plan. Denies HI. Reports has been feeling this way for week. Denies any other concerns

## 2023-05-12 NOTE — ED Notes (Signed)
Patient to room 34. Patient ambulated to room.  Patient calm and cooperative. Patient oriented to unit and room.

## 2023-05-12 NOTE — ED Notes (Signed)
 Pt belongings placed in locker 34.

## 2023-05-12 NOTE — Progress Notes (Signed)
 [facility] LCSW Progress Note   05/12/2023    7:09 PM   Grace Montgomery    [id number]    Type of Contact and Topic:  Psychiatric Bed Placement    Pt accepted to [facility]      Patient meets inpatient criteria per Alona Bene, PMHNP     The attending provider will be Dr. Sherrian Divers   Call report to [phone]     Matilde Bash, EMT @ [facility] ED notified.      Pt scheduled  to arrive at [facility] AFTER 0900.      [name], MSW, LCSW-A  7:10 PM 05/12/2023

## 2023-05-12 NOTE — ED Notes (Addendum)
 Pt has been ACCEPTED to [facility] for tomorrow  TS Reporting number is [phone] Dr. Sherrian Divers

## 2023-05-12 NOTE — ED Notes (Signed)
 Pt dressed into purple scrubs. Pt has 2 belonging bags placed in cabinet HALL C. In bags contain purse, phone, lighter, clothing, shoes. Both bags labeled with her tag.

## 2023-05-12 NOTE — ED Notes (Signed)
 [facility] called pts Tailored Care Manager Einar Grad) at [facility]. Per pts TCM, pt was renting a room with the money she received from her 34 year old daughters disability check. Pt does not have custody of her daughter. The situation was reported to social security and payments were stopped which caused pt to be evicted from her room leaving her with no place to go. Pt does not currently have a stable source of income. Per pts care manager, pt is on waiting lists for shelter. CM attempted to get pt into [employer] last week without success. Pts CM has been working with pt for about a year and a half and is very familiar with her situation.   Per pts care manager, pt has been diagnosed with bipolar disorder and has multiple suicide attempts in the past with the most recent being in December of 2024 when pt overdosed on her medications. Pts CM confirmed that pt does have contact with her mother but is not able to live with her. Pts CM would like to be notified of pts disposition.   Jacquelynn Cree, [facility]  05/12/23

## 2023-05-13 NOTE — ED Notes (Signed)
 Called Grace Montgomery     they advised to call back in 30 minutes so day shift could get settled

## 2023-05-13 NOTE — ED Notes (Signed)
 [facility] LCSW Progress Note   05/12/2023    7:09 PM   Grace Montgomery    [id number]    Type of Contact and Topic:  Psychiatric Bed Placement    Pt accepted to [facility]      Patient meets inpatient criteria per Alona Bene, PMHNP     The attending provider will be Dr. Sherrian Divers   Call report to [phone]     Matilde Bash, EMT @ [facility] ED notified.      Pt scheduled  to arrive at [facility] AFTER 0900.      [name], MSW, LCSW-A  7:10 PM 05/12/2023

## 2023-05-13 NOTE — ED Provider Notes (Signed)
 Emergency Medicine Observation Re-evaluation Note  Grace Montgomery is a 33 y.o. female, seen on rounds today.  Pt initially presented to the ED for complaints of Suicidal   Physical Exam  BP (!) 104/52 (BP Location: Left Arm)   Pulse 72   Temp 98.2 F (36.8 C) (Oral)   Resp 18   LMP 04/18/2023 (Exact Date)   SpO2 100%  Physical Exam General: nad  ED Course / MDM  EKG:   I have reviewed the labs performed to date as well as medications administered while in observation.  Recent changes in the last 24 hours include no acute events.  Plan  Current plan is for inpatient treatment at [facility].  Patient will be transferred today.  Patient is medically stable for transfer.    Linwood Dibbles, MD 05/13/23 [phone]

## 2023-05-16 ENCOUNTER — Other Ambulatory Visit: Payer: Self-pay | Admitting: Family Medicine

## 2023-05-27 ENCOUNTER — Other Ambulatory Visit: Payer: Self-pay

## 2023-05-27 ENCOUNTER — Emergency Department (HOSPITAL_COMMUNITY)
Admission: EM | Admit: 2023-05-27 | Discharge: 2023-05-28 | Disposition: A | Discharge status: Home / Self Care | Payer: MEDICAID | Attending: Emergency Medicine | Admitting: Emergency Medicine

## 2023-05-27 ENCOUNTER — Encounter (HOSPITAL_COMMUNITY): Payer: Self-pay | Admitting: *Deleted

## 2023-05-27 DIAGNOSIS — R4689 Other symptoms and signs involving appearance and behavior: Secondary | ICD-10-CM | POA: Diagnosis present

## 2023-05-27 DIAGNOSIS — T1491XA Suicide attempt, initial encounter: Secondary | ICD-10-CM | POA: Diagnosis not present

## 2023-05-27 DIAGNOSIS — X58XXXA Exposure to other specified factors, initial encounter: Secondary | ICD-10-CM | POA: Insufficient documentation

## 2023-05-27 LAB — CBC
HCT: 35.6 % — ABNORMAL LOW (ref 36.0–46.0)
Hemoglobin: 11.5 g/dL — ABNORMAL LOW (ref 12.0–15.0)
MCH: 28.5 pg (ref 26.0–34.0)
MCHC: 32.3 g/dL (ref 30.0–36.0)
MCV: 88.3 fL (ref 80.0–100.0)
Platelets: 556 10*3/uL — ABNORMAL HIGH (ref 150–400)
RBC: 4.03 MIL/uL (ref 3.87–5.11)
RDW: 16.3 % — ABNORMAL HIGH (ref 11.5–15.5)
WBC: 12.1 10*3/uL — ABNORMAL HIGH (ref 4.0–10.5)
nRBC: 0 % (ref 0.0–0.2)

## 2023-05-27 LAB — COMPREHENSIVE METABOLIC PANEL
ALT: 35 U/L (ref 0–44)
AST: 31 U/L (ref 15–41)
Albumin: 3.7 g/dL (ref 3.5–5.0)
Alkaline Phosphatase: 74 U/L (ref 38–126)
Anion gap: 15 (ref 5–15)
BUN: 9 mg/dL (ref 6–20)
CO2: 23 mmol/L (ref 22–32)
Calcium: 8.6 mg/dL — ABNORMAL LOW (ref 8.9–10.3)
Chloride: 101 mmol/L (ref 98–111)
Creatinine, Ser: 0.9 mg/dL (ref 0.44–1.00)
GFR, Estimated: >60 mL/min (ref >60)
Glucose, Bld: 125 mg/dL — ABNORMAL HIGH (ref 70–99)
Potassium: 3.9 mmol/L (ref 3.5–5.1)
Sodium: 139 mmol/L (ref 135–145)
Total Bilirubin: 0.4 mg/dL (ref 0.0–1.2)
Total Protein: 7.8 g/dL (ref 6.5–8.1)

## 2023-05-27 LAB — ACETAMINOPHEN LEVEL: Acetaminophen (Tylenol), Serum: <10 ug/mL — ABNORMAL LOW (ref 10–30)

## 2023-05-27 LAB — RAPID URINE DRUG SCREEN, HOSP PERFORMED
Amphetamines: POSITIVE — AB
Barbiturates: NOT DETECTED
Benzodiazepines: NOT DETECTED
Cocaine: POSITIVE — AB
Opiates: NOT DETECTED
Tetrahydrocannabinol: POSITIVE — AB

## 2023-05-27 LAB — SALICYLATE LEVEL: Salicylate Lvl: <7 mg/dL — ABNORMAL LOW (ref 7.0–30.0)

## 2023-05-27 LAB — ETHANOL: Alcohol, Ethyl (B): 48 mg/dL — ABNORMAL HIGH (ref <10)

## 2023-05-27 LAB — HCG, SERUM, QUALITATIVE: Preg, Serum: NEGATIVE

## 2023-05-27 NOTE — ED Notes (Signed)
 Pt has been dressed out into purple scrubs. Pt belongings in purple locker #2. Pt psych evaluation folder is with triage RN.

## 2023-05-27 NOTE — ED Triage Notes (Signed)
 The pt reports that she is suicidal and she was in a place in [place] and they released so she came here she reports she has meds that she plans to take   lmp march 13th

## 2023-05-27 NOTE — ED Provider Triage Note (Signed)
 Emergency Medicine Provider Triage Evaluation Note  Grace Montgomery , a 33 y.o. female  was evaluated in triage.  Pt complains of feeling depressed and having thoughts of suicide. Indicates it is a lot of things, denies specific inciting events. Indicates around 6 pm took 4 trazodone and 2 seroquel. Denies other ingestion or other attempt at harm. Indicates trouble sleeping at night, decreased appetite.   Review of Systems  Positive: Depression. Negative: Fevers, chest pain, sob.   Physical Exam  Ht 1.6 m (5\' 3" )   Wt 87 kg   LMP 05/21/2023 (Exact Date)   BMI 33.98 kg/m  Gen:   Awake, no distress.  tearful.  Resp:  Normal effort cta bil.  MSK:   Moves extremities without difficulty. No edemal Psych: tearful, depressed mood. +SI.   Medical Decision Making  Medically screening exam initiated at 9:44 PM.  Appropriate orders placed.  Grace Montgomery was informed that the remainder of the evaluation will be completed by another provider, this initial triage assessment does not replace that evaluation, and the importance of remaining in the ED until their evaluation is complete.  Labs sent. BH team consulted.   The patient has been placed in psychiatric observation due to the need to provide a safe environment for the patient while obtaining psychiatric consultation and evaluation, as well as ongoing medical and medication management to treat the patient's condition.    Med rec pending.   Dispo per [facility] team.    Cathren Laine, MD 05/27/23 [phone]

## 2023-05-28 ENCOUNTER — Inpatient Hospital Stay (HOSPITAL_COMMUNITY)
Admission: AD | Admit: 2023-05-28 | Discharge: 2023-06-02 | DRG: 885 | Disposition: A | Discharge status: Home / Self Care | Payer: MEDICAID | Source: Intra-hospital | Attending: Psychiatry | Admitting: Psychiatry

## 2023-05-28 DIAGNOSIS — F1090 Alcohol use, unspecified, uncomplicated: Secondary | ICD-10-CM | POA: Diagnosis present

## 2023-05-28 DIAGNOSIS — F411 Generalized anxiety disorder: Secondary | ICD-10-CM | POA: Diagnosis present

## 2023-05-28 DIAGNOSIS — F1410 Cocaine abuse, uncomplicated: Secondary | ICD-10-CM | POA: Diagnosis present

## 2023-05-28 DIAGNOSIS — Z59819 Housing instability, housed unspecified: Secondary | ICD-10-CM

## 2023-05-28 DIAGNOSIS — F17210 Nicotine dependence, cigarettes, uncomplicated: Secondary | ICD-10-CM | POA: Diagnosis present

## 2023-05-28 DIAGNOSIS — G4700 Insomnia, unspecified: Secondary | ICD-10-CM | POA: Diagnosis present

## 2023-05-28 DIAGNOSIS — F1010 Alcohol abuse, uncomplicated: Secondary | ICD-10-CM | POA: Diagnosis present

## 2023-05-28 DIAGNOSIS — Z886 Allergy status to analgesic agent status: Secondary | ICD-10-CM | POA: Diagnosis not present

## 2023-05-28 DIAGNOSIS — R4689 Other symptoms and signs involving appearance and behavior: Secondary | ICD-10-CM | POA: Diagnosis not present

## 2023-05-28 DIAGNOSIS — Z5982 Transportation insecurity: Secondary | ICD-10-CM | POA: Diagnosis not present

## 2023-05-28 DIAGNOSIS — Z5900 Homelessness unspecified: Secondary | ICD-10-CM | POA: Diagnosis not present

## 2023-05-28 DIAGNOSIS — R45851 Suicidal ideations: Secondary | ICD-10-CM

## 2023-05-28 DIAGNOSIS — E119 Type 2 diabetes mellitus without complications: Secondary | ICD-10-CM | POA: Diagnosis present

## 2023-05-28 DIAGNOSIS — T43212A Poisoning by selective serotonin and norepinephrine reuptake inhibitors, intentional self-harm, initial encounter: Secondary | ICD-10-CM | POA: Diagnosis present

## 2023-05-28 DIAGNOSIS — Z79899 Other long term (current) drug therapy: Secondary | ICD-10-CM

## 2023-05-28 DIAGNOSIS — Z7984 Long term (current) use of oral hypoglycemic drugs: Secondary | ICD-10-CM

## 2023-05-28 DIAGNOSIS — Z5986 Financial insecurity: Secondary | ICD-10-CM | POA: Diagnosis not present

## 2023-05-28 DIAGNOSIS — F17200 Nicotine dependence, unspecified, uncomplicated: Secondary | ICD-10-CM | POA: Diagnosis present

## 2023-05-28 DIAGNOSIS — F332 Major depressive disorder, recurrent severe without psychotic features: Principal | ICD-10-CM | POA: Diagnosis present

## 2023-05-28 DIAGNOSIS — F1520 Other stimulant dependence, uncomplicated: Secondary | ICD-10-CM | POA: Diagnosis present

## 2023-05-28 DIAGNOSIS — Z555 Less than a high school diploma: Secondary | ICD-10-CM

## 2023-05-28 DIAGNOSIS — Z1152 Encounter for screening for COVID-19: Secondary | ICD-10-CM | POA: Diagnosis not present

## 2023-05-28 DIAGNOSIS — Z833 Family history of diabetes mellitus: Secondary | ICD-10-CM

## 2023-05-28 DIAGNOSIS — Y902 Blood alcohol level of 40-59 mg/100 ml: Secondary | ICD-10-CM | POA: Diagnosis present

## 2023-05-28 DIAGNOSIS — F329 Major depressive disorder, single episode, unspecified: Principal | ICD-10-CM | POA: Diagnosis present

## 2023-05-28 LAB — ACETAMINOPHEN LEVEL: Acetaminophen (Tylenol), Serum: <10 ug/mL — ABNORMAL LOW (ref 10–30)

## 2023-05-28 MED ORDER — METFORMIN HCL 500 MG PO TABS
1000.0000 mg | ORAL_TABLET | Freq: Every day | ORAL | Status: DC
  Administered 2023-05-29: 1000 mg via ORAL
  Filled 2023-05-28 (×3): qty 2

## 2023-05-28 MED ORDER — MENTHOL 3 MG MT LOZG
1.0000 | LOZENGE | OROMUCOSAL | Status: DC | PRN
  Administered 2023-05-28: 3 mg via ORAL
  Filled 2023-05-28: qty 9

## 2023-05-28 MED ORDER — SERTRALINE HCL 50 MG PO TABS
50.0000 mg | ORAL_TABLET | Freq: Every day | ORAL | Status: DC
  Administered 2023-05-29 – 2023-06-02 (×5): 50 mg via ORAL
  Filled 2023-05-28 (×5): qty 1
  Filled 2023-05-28: qty 14

## 2023-05-28 MED ORDER — DIPHENHYDRAMINE HCL 50 MG/ML IJ SOLN: 50.0000 mg | Freq: Three times a day (TID) | INTRAMUSCULAR | Status: DC | PRN

## 2023-05-28 MED ORDER — HALOPERIDOL 5 MG PO TABS: 5.0000 mg | ORAL_TABLET | Freq: Three times a day (TID) | ORAL | Status: DC | PRN

## 2023-05-28 MED ORDER — ALBUTEROL SULFATE HFA 108 (90 BASE) MCG/ACT IN AERS
2.0000 | INHALATION_SPRAY | Freq: Four times a day (QID) | RESPIRATORY_TRACT | Status: DC | PRN
Start: 2023-05-28 — End: 2023-06-02
  Filled 2023-05-28: qty 6.7

## 2023-05-28 MED ORDER — TRAZODONE HCL 100 MG PO TABS
100.0000 mg | ORAL_TABLET | Freq: Every day | ORAL | Status: DC
  Administered 2023-05-28 – 2023-05-31 (×4): 100 mg via ORAL
  Filled 2023-05-28 (×6): qty 1

## 2023-05-28 MED ORDER — ADULT MULTIVITAMIN W/MINERALS CH
1.0000 | ORAL_TABLET | Freq: Every day | ORAL | Status: DC
  Administered 2023-05-29 – 2023-06-02 (×5): 1 via ORAL
  Filled 2023-05-28: qty 14
  Filled 2023-05-28: qty 1
  Filled 2023-05-28: qty 14
  Filled 2023-05-28: qty 1
  Filled 2023-05-28: qty 14
  Filled 2023-05-28 (×3): qty 1

## 2023-05-28 MED ORDER — LORAZEPAM 2 MG/ML IJ SOLN: 2.0000 mg | Freq: Three times a day (TID) | INTRAMUSCULAR | Status: DC | PRN

## 2023-05-28 MED ORDER — ONDANSETRON HCL 4 MG PO TABS
4.0000 mg | ORAL_TABLET | Freq: Four times a day (QID) | ORAL | Status: DC
  Filled 2023-05-28 (×23): qty 1

## 2023-05-28 MED ORDER — DIPHENHYDRAMINE HCL 25 MG PO CAPS: 50.0000 mg | ORAL_CAPSULE | Freq: Three times a day (TID) | ORAL | Status: DC | PRN

## 2023-05-28 MED ORDER — ACETAMINOPHEN 325 MG PO TABS
650.0000 mg | ORAL_TABLET | Freq: Four times a day (QID) | ORAL | Status: DC | PRN
  Administered 2023-05-29 – 2023-05-31 (×4): 650 mg via ORAL
  Filled 2023-05-28 (×5): qty 2

## 2023-05-28 MED ORDER — MAGNESIUM HYDROXIDE 400 MG/5ML PO SUSP: 30.0000 mL | Freq: Every day | ORAL | Status: DC | PRN

## 2023-05-28 MED ORDER — HALOPERIDOL LACTATE 5 MG/ML IJ SOLN: 5.0000 mg | Freq: Three times a day (TID) | INTRAMUSCULAR | Status: DC | PRN

## 2023-05-28 MED ORDER — ALUM & MAG HYDROXIDE-SIMETH 200-200-20 MG/5ML PO SUSP: 30.0000 mL | ORAL | Status: DC | PRN

## 2023-05-28 MED ORDER — HALOPERIDOL LACTATE 5 MG/ML IJ SOLN: 10.0000 mg | Freq: Three times a day (TID) | INTRAMUSCULAR | Status: DC | PRN

## 2023-05-28 NOTE — Progress Notes (Signed)
 [facility] LCSW Progress Note   05/28/2023    1:52 PM  Grace Montgomery   [id number]   Type of Contact and Topic:  Psychiatric Bed Placement   Pt accepted to [facility] 404-1     Patient meets inpatient criteria per Roselyn Bering, NP   The attending provider will be Dr. Abbott Pao   Call report to [phone]    Enid Derry, RN @ [facility] ED notified.     Pt scheduled  to arrive at [facility] TODAY @ 2000.    [name], MSW, LCSW-A  1:53 PM 05/28/2023

## 2023-05-28 NOTE — ED Notes (Addendum)
 Pt belongings in purple locker #3 as well. Psych folder with primary RN.

## 2023-05-28 NOTE — ED Triage Notes (Signed)
 The pt was wanded by security  she has been dressed out for hours her personal belongings have neem moved to the nurses station

## 2023-05-28 NOTE — ED Notes (Signed)
[facility] called for transport to [facility] 

## 2023-05-28 NOTE — ED Notes (Signed)
 IVC case# [id number]

## 2023-05-28 NOTE — ED Provider Notes (Signed)
 Blood pressure 112/68, pulse 96, temperature 98.2 F (36.8 C), resp. rate 18, height 5\' 3"  (1.6 m), weight 87 kg, last menstrual period 05/21/2023, SpO2 99%.  In short, Grace Montgomery is a 33 y.o. female with a chief complaint of Psychiatric Evaluation .  Refer to the original H&P for additional details.  07:43 PM  Patient accepted to [facility]. Dr. Abbott Pao is the accepting MD. Patient under IVC. Will transport when bed is ready.     Maia Plan, MD 05/28/23 [phone]

## 2023-05-28 NOTE — ED Notes (Signed)
 Patient transported to [facility] via Emergency planning/management officer. All belongings sent with patient. Patient without questions at time of transport.
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
## 2023-05-28 NOTE — ED Provider Notes (Addendum)
 [place] EMERGENCY DEPARTMENT AT [facility] Provider Note   CSN: [id number] Arrival date & time: 05/27/23  2010     History  Chief Complaint  Patient presents with   Psychiatric Evaluation    Grace Montgomery is a 33 y.o. female history of depression, anxiety, suicide attempts presented with suicidal ideation.  Patient states she was recent in [place] however was discharged from there as the place is not helping her with her depression.  Patient dates that she took 4 of her trazodone and 3 of her Seroquel at 6 PM early this evening as an attempt to end her life.  Patient states she has history of same.  Patient denies any chest pain shortness of breath, feeling feverish, altered mental status, hyperactivity.  Patient would like to see the psych team.  Patient denies HI.  Home Medications Prior to Admission medications   Medication Sig Start Date End Date Taking? Authorizing Provider  albuterol (VENTOLIN HFA) 108 (90 Base) MCG/ACT inhaler Inhale 2 puffs into the lungs every 6 (six) hours as needed for wheezing or shortness of breath. 02/17/23   Ntuen, Jesusita Oka, FNP  fluconazole (DIFLUCAN) 150 MG tablet Take 1 tablet (150 mg total) by mouth once as needed for up to 2 doses (take one pill on day 1, and the second pill 3 days later). Patient not taking: Reported on 05/12/2023 03/10/23   Rising, Lurena Joiner, PA-C  hydrOXYzine (ATARAX) 25 MG tablet Take 1 tablet (25 mg total) by mouth 3 (three) times daily as needed for anxiety. Patient not taking: Reported on 05/12/2023 02/17/23   Cecilie Lowers, FNP  Multiple Vitamin (MULTIVITAMIN WITH MINERALS) TABS tablet Take 1 tablet by mouth daily. Patient not taking: Reported on 05/12/2023 02/18/23   Ntuen, Jesusita Oka, FNP  ondansetron (ZOFRAN) 4 MG tablet Take 1 tablet (4 mg total) by mouth every 6 (six) hours. Patient not taking: Reported on 05/12/2023 05/12/23   Netta Corrigan, PA-C      Allergies    Monistat [tioconazole], Chocolate, Ibuprofen, and  Banana    Review of Systems   Review of Systems  Physical Exam Updated Vital Signs BP 136/88 (BP Location: Right Arm)   Pulse 99   Temp 98.4 F (36.9 C) (Oral)   Resp 18   Ht 5\' 3"  (1.6 m)   Wt 87 kg   LMP 05/21/2023 (Exact Date)   SpO2 100%   BMI 33.98 kg/m  Physical Exam Vitals reviewed.  Constitutional:      General: She is not in acute distress. HENT:     Head: Normocephalic and atraumatic.  Eyes:     Extraocular Movements: Extraocular movements intact.     Conjunctiva/sclera: Conjunctivae normal.     Pupils: Pupils are equal, round, and reactive to light.  Cardiovascular:     Rate and Rhythm: Normal rate and regular rhythm.     Pulses: Normal pulses.     Heart sounds: Normal heart sounds.     Comments: 2+ bilateral radial/dorsalis pedis pulses with regular rate Pulmonary:     Effort: Pulmonary effort is normal. No respiratory distress.     Breath sounds: Normal breath sounds.  Abdominal:     Palpations: Abdomen is soft.     Tenderness: There is no abdominal tenderness. There is no guarding or rebound.  Musculoskeletal:        General: Normal range of motion.     Cervical back: Normal range of motion and neck supple.  Comments: 5 out of 5 bilateral grip/leg extension strength  Skin:    General: Skin is warm and dry.     Capillary Refill: Capillary refill takes less than 2 seconds.  Neurological:     General: No focal deficit present.     Mental Status: She is alert and oriented to person, place, and time.     Comments: Sensation intact in all 4 limbs  Psychiatric:     Comments: Actively endorsing SI Not endorsing HI     ED Results / Procedures / Treatments   Labs (all labs ordered are listed, but only abnormal results are displayed) Labs Reviewed  COMPREHENSIVE METABOLIC PANEL - Abnormal; Notable for the following components:      Result Value   Glucose, Bld 125 (*)    Calcium 8.6 (*)    All other components within normal limits  ETHANOL -  Abnormal; Notable for the following components:   Alcohol, Ethyl (B) 48 (*)    All other components within normal limits  SALICYLATE LEVEL - Abnormal; Notable for the following components:   Salicylate Lvl <7.0 (*)    All other components within normal limits  ACETAMINOPHEN LEVEL - Abnormal; Notable for the following components:   Acetaminophen (Tylenol), Serum <10 (*)    All other components within normal limits  CBC - Abnormal; Notable for the following components:   WBC 12.1 (*)    Hemoglobin 11.5 (*)    HCT 35.6 (*)    RDW 16.3 (*)    Platelets 556 (*)    All other components within normal limits  RAPID URINE DRUG SCREEN, HOSP PERFORMED - Abnormal; Notable for the following components:   Cocaine POSITIVE (*)    Amphetamines POSITIVE (*)    Tetrahydrocannabinol POSITIVE (*)    All other components within normal limits  HCG, SERUM, QUALITATIVE    EKG None  Radiology No results found.  Procedures Procedures    Medications Ordered in ED Medications - No data to display  ED Course/ Medical Decision Making/ A&P                                 Medical Decision Making Amount and/or Complexity of Data Reviewed Labs: ordered.   [name] 33 y.o. presented today for psych evaluation. Working DDx that I considered at this time includes, but not limited to, primary psychosis, substance-induced psychosis, mood disturbance, SI/HI.  R/o DDx: primary psychosis, substance-induced psychosis, mood disturbance, SI: These are considered less likely due to history of present illness, physical exam, labs/imaging findings  Review of prior external notes: 05/12/2023 ED  Unique Tests and My Independent Interpretation:  CBC: Unremarkable CMP: Unremarkable UDS: Cocaine, amphetamine, THC positive Ethanol: 48 APAP: Unremarkable Salicylate level: Unremarkable Serum qualitative: Negative EKG: Sinus 80 bpm, QT is not prolonged, no signs of ischemia  Social Determinants of  Health: EtOH/Substance Abuse  Discussion with Independent Historian: None  Discussion of Management of Tests:   Shalon Bobbitt,NP  Psych;   Risk: High: hospitalization or escalation of hospital-level care  Risk Stratification Score: none  Plan: Patient presented for psychiatric evaluation.  Patient is endorsing SI.  Patient does have a history of depression, anxiety for which they are on Atarax.  They are compliant with their medications. On my initial exam, the pt was linear in thought, appropriate in affect, and overall well-appearing.  Vital signs reviewed and reassuring. With the patient's presentation of SI,  patient warrants emergent psychiatric consultation.  On my exam patient does not have any clonus or tremors and is not tachycardic or febrile indicative of serotonin syndrome as she states she did take 4 trazodone and 2 Seroquel as an attempt to end her life at 6 PM earlier this evening.  It has been 7 and half hours since then.  IVC was filled out as patient still endorsing SI and did try to end her life.  Patient immediately placed into ED psychiatric hold protocol including suicide precautions, elopement precautions and vital sign monitoring.  Patient was medically cleared through medical clearance labs up in triage and TTS evaluated and Roselyn Bering, NP from psych states that patient will need inpatient admission.  I spoke to poison control about patient's ingestion and they state to reevaluate patient and monitor until 2 AM and if she is not tachycardic febrile or having symptoms she is okay.  They do recommend repeating Tylenol level here.  Patient does not endorse taking any Tylenol however we will repeat this.  Will also get EKG to evaluate for QTc.  Labs are reassuring.  Patient has been monitored and does not become symptomatic at this time is medically cleared for inpatient admission.  This chart was dictated using voice recognition software.  Despite best efforts to  proofread,  errors can occur which can change the documentation meaning.        Final Clinical Impression(s) / ED Diagnoses Final diagnoses:  Suicide attempt [facility])    Rx / DC Orders ED Discharge Orders     None         Remi Deter 05/28/23 0344    Netta Corrigan, PA-C 05/28/23 0345    Zadie Rhine, MD 05/28/23 [phone]

## 2023-05-29 ENCOUNTER — Encounter (HOSPITAL_COMMUNITY): Payer: Self-pay | Admitting: Psychiatry

## 2023-05-29 DIAGNOSIS — F332 Major depressive disorder, recurrent severe without psychotic features: Secondary | ICD-10-CM

## 2023-05-29 LAB — TSH: TSH: 0.916 u[IU]/mL (ref 0.350–4.500)

## 2023-05-29 LAB — VITAMIN B12: Vitamin B-12: 199 pg/mL (ref 180–914)

## 2023-05-29 LAB — RESPIRATORY PANEL BY PCR

## 2023-05-29 LAB — LIPID PANEL
Cholesterol: 156 mg/dL (ref 0–200)
HDL: 84 mg/dL (ref >40)
LDL Cholesterol: 52 mg/dL (ref 0–99)
Total CHOL/HDL Ratio: 1.9 ratio
Triglycerides: 101 mg/dL (ref <150)
VLDL: 20 mg/dL (ref 0–40)

## 2023-05-29 LAB — HEMOGLOBIN A1C
Hgb A1c MFr Bld: 6 % — ABNORMAL HIGH (ref 4.8–5.6)
Mean Plasma Glucose: 125.5 mg/dL

## 2023-05-29 LAB — VITAMIN D 25 HYDROXY (VIT D DEFICIENCY, FRACTURES): Vit D, 25-Hydroxy: 34.51 ng/mL (ref 30–100)

## 2023-05-29 LAB — FOLATE: Folate: 15.2 ng/mL (ref >5.9)

## 2023-05-29 MED ORDER — NICOTINE POLACRILEX 2 MG MT GUM: 2.0000 mg | CHEWING_GUM | OROMUCOSAL | Status: DC | PRN

## 2023-05-29 MED ORDER — METFORMIN HCL 500 MG PO TABS
1000.0000 mg | ORAL_TABLET | Freq: Two times a day (BID) | ORAL | Status: DC
  Administered 2023-05-30 – 2023-06-02 (×7): 1000 mg via ORAL
  Filled 2023-05-29 (×4): qty 2
  Filled 2023-05-29: qty 56
  Filled 2023-05-29: qty 2
  Filled 2023-05-29: qty 56
  Filled 2023-05-29 (×2): qty 2

## 2023-05-29 MED ORDER — WHITE PETROLATUM EX OINT
TOPICAL_OINTMENT | CUTANEOUS | Status: AC
  Filled 2023-05-29: qty 5

## 2023-05-29 MED ORDER — WHITE PETROLATUM EX OINT
TOPICAL_OINTMENT | CUTANEOUS | Status: AC
Start: 2023-05-29 — End: 2023-05-29
  Filled 2023-05-29: qty 5

## 2023-05-29 NOTE — BHH Counselor (Signed)
 Clinical Social Work Note  CSW attempted to do PSA with patient but she complained of sore throat and said she could not talk.  She stated she will not do anything at all until the staff help her to take care of her sore throat.  Ambrose Mantle, LCSW 05/29/2023, 11:18 AM

## 2023-05-29 NOTE — Progress Notes (Signed)
 Grace Montgomery came to nurse's station said her bathroom commode is not clean. Grace Montgomery said there is bowel movement on the seat of the commode. Grace Montgomery said the bathroom has not been clean. Writer went in the bathroom there was no bowel movement on the commode,  none on the floor.There was no bowel movement on the sink. The bathroom appeared to be clean.Environment clean the bathroom again. Write flush the commode before the patient came inside the bathroom there was nothing wrong with the commode. Grace Montgomery came back to the nurse's station and said the commode would not flush. Writer went to the bathroom and the commode was clogged with toilet tissue and brown substance. Environment was called a second time to unclogged the commode. RN notify.

## 2023-05-29 NOTE — Tx Team (Signed)
 Initial Treatment Plan 05/29/2023 1:49 AM Grace Montgomery [id number]    PATIENT STRESSORS: Financial difficulties   Marital or family conflict   Occupational concerns   Substance abuse     PATIENT STRENGTHS: Communication skills    PATIENT IDENTIFIED PROBLEMS: Suicidal  Depression   Anxiety  " Go to sleep"               DISCHARGE CRITERIA:  Ability to meet basic life and health needs Adequate post-discharge living arrangements Improved stabilization in mood, thinking, and/or behavior Verbal commitment to aftercare and medication compliance  PRELIMINARY DISCHARGE PLAN: Attend PHP/IOP Outpatient therapy Placement in alternative living arrangements  PATIENT/FAMILY INVOLVEMENT: This treatment plan has been presented to and reviewed with the patient, Grace Montgomery, and/or family member.  The patient and family have been given the opportunity to ask questions and make suggestions.  Bethann Punches, RN 05/29/2023, 1:49 AM

## 2023-05-29 NOTE — Group Note (Signed)
 Date:  05/29/2023 Time:  4:26 PM  Group Topic/Focus:  Goals Group:   The focus of this group is to help patients establish daily goals to achieve during treatment and discuss how the patient can incorporate goal setting into their daily lives to aide in recovery. Orientation:   The focus of this group is to educate the patient on the purpose and policies of crisis stabilization and provide a format to answer questions about their admission.  The group details unit policies and expectations of patients while admitted.    Participation Level:  Did Not Attend  Participation Quality:   n/a  Affect:   n/a  Cognitive:   n/a  Insight: None  Engagement in Group:   n/a  Modes of Intervention:   n/a  Additional Comments:   Did not attend  Stark Bray 05/29/2023, 4:26 PM
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
## 2023-05-29 NOTE — Plan of Care (Signed)
   Problem: Education: Goal: Emotional status will improve Outcome: Progressing Goal: Mental status will improve Outcome: Progressing

## 2023-05-29 NOTE — Progress Notes (Signed)
 Grace Montgomery is a 33 y.o. female involuntarily admitted for suicidal ideation with the plan to overdose on trazodone, pt was admitted 2 weeks ago at [facility] for suicide attempt. Pt stated she is having financial difficulty, housing issues with 4 children, has been staying on different people couches. Pt is unemployed, hx of substance abuse.  Pt has been alert and cooperative with admission process, denied SI/HI, AVH and verbally contracted for safety. Consents signed, skin/belongings search completed and pt oriented to unit. Pt stable at this time. Pt given the opportunity to express concerns and ask questions. Pt given toiletries. Will continue to monitor.

## 2023-05-29 NOTE — [facility] Suicide Risk Assessment (Signed)
 [facility] INPATIENT:  Family/Significant Other Suicide Prevention Education  Suicide Prevention Education:  Patient Refusal for Family/Significant Other Suicide Prevention Education: The patient Grace Montgomery has refused to provide written consent for family/significant other to be provided Family/Significant Other Suicide Prevention Education during admission and/or prior to discharge.  Physician notified.  Patient reports having nobody for [place] to talk to.  Carloyn Jaeger Grossman-Orr 05/29/2023, 3:07 PM

## 2023-05-29 NOTE — Progress Notes (Addendum)
 D. Pt presents calm and cooperative- has been appropriate during interactions. Pt stayed in bed for most of the shift with complaints of sore throat, which pt attributes to her tonsils- Per pt's self inventory, pt rated her depression,hopelessness and anxiety 2/10's. Pt's stated goal today was "to work on finding a place to go". Pt currently denies SI/HI and AVH  A. Labs and vitals monitored. Pt given and educated on medications. Pt supported emotionally and encouraged to express concerns and ask questions.   R. Pt remains safe with 15 minute checks. Will continue POC.    05/29/23 1000  Psych Admission Type (Psych Patients Only)  Admission Status Involuntary  Psychosocial Assessment  Patient Complaints Depression  Eye Contact Brief  Facial Expression Sad  Affect Preoccupied  Speech Logical/coherent  Interaction Assertive  Motor Activity Slow  Appearance/Hygiene Unremarkable  Behavior Characteristics Cooperative;Calm  Mood Depressed  Thought Process  Coherency WDL  Content WDL  Delusions None reported or observed  Perception WDL  Hallucination None reported or observed  Judgment Limited  Confusion None  Danger to Self  Current suicidal ideation? Denies  Danger to Others  Danger to Others None reported or observed

## 2023-05-29 NOTE — Group Note (Addendum)
 Date:  05/29/2023 Time:  9:51 PM  Group Topic/Focus:  Wrap-Up Group:   The focus of this group is to help patients review their daily goal of treatment and discuss progress on daily workbooks.    Participation Level:  Active  Participation Quality:  Appropriate and Attentive  Affect:  Appropriate  Cognitive:  Alert and Appropriate  Insight: Appropriate and Good  Engagement in Group:  Engaged  Modes of Intervention:  Discussion and Education  Additional Comments:  Pt attended and participated in wrap up group this evening and rated their day a 6/10. Pt stated that their day started at a 2, due to their treatment team waking them for assessments. While they are here, they would benefit from resources to abstain from alcohol and drugs.   Chrisandra Netters 05/29/2023, 9:51 PM

## 2023-05-29 NOTE — H&P (Signed)
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  Diagnoses / Active Problems: Patient Active Problem List   Diagnosis Date Noted   GAD (generalized anxiety disorder) 02/13/2023   Nicotine dependence 02/13/2023   Cocaine use disorder (HCC) 02/13/2023   Alcohol use disorder 02/13/2023   Insomnia 02/13/2023   Suicidal ideation 11/07/2020   MDD (major depressive disorder), recurrent severe, 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   Type 2 diabetes mellitus without complication, without long-term current use of insulin (HCC) 11/06/2016     PLAN: Safety and Monitoring:  -- Voluntary admission to inpatient psychiatric unit for safety, stabilization and treatment  -- Daily contact with patient to assess and evaluate symptoms and progress in treatment  -- Patient's  case to be discussed in multi-disciplinary team meeting  -- Observation Level : q15 minute checks  -- Vital signs:  q12 hours  -- Precautions: suicide, elopement, and assault  2. Psychiatric Diagnoses and Treatment:  Patient Active Problem List   Diagnosis Date Noted   GAD (generalized anxiety disorder) 02/13/2023   Nicotine dependence 02/13/2023   Cocaine use disorder (HCC) 02/13/2023   Alcohol use disorder 02/13/2023   Insomnia 02/13/2023   Suicidal ideation 11/07/2020   MDD (major depressive disorder), recurrent severe, 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   Type 2 diabetes mellitus without complication, without long-term current use of insulin (HCC) 11/06/2016     Scheduled Medications:  metFORMIN  1,000 mg Oral Daily   multivitamin with minerals  1 tablet Oral Daily   ondansetron  4 mg Oral Q6H   sertraline  50 mg Oral Daily   traZODone  100 mg Oral QHS     As Needed Medications: acetaminophen, albuterol, alum & mag hydroxide-simeth, haloperidol **AND** diphenhydrAMINE, haloperidol lactate **AND** diphenhydrAMINE **AND** LORazepam, haloperidol lactate **AND** diphenhydrAMINE **AND** LORazepam, magnesium hydroxide, menthol-cetylpyridinium, nicotine polacrilex    3. Medical Issues Being Addressed:    Labs reviewed, unremarkable with the exception of: Elevated white blood cell count and platelets.  UDS is positive for amphetamines, cocaine, and THC.   Tobacco Use Disorder  -- Nicotine replacement as above  -- Smoking cessation encouraged  4. Discharge Planning:   -- Social work and case management to assist with discharge planning and identification of hospital follow-up needs prior to discharge  -- Estimated LOS: 5-7 days  -- Discharge Concerns: Need to establish a safety plan; Medication compliance and effectiveness  -- Discharge Goals: Return home with outpatient referrals for mental health follow-up including medication management/psychotherapy  5. Short Term Goals:   Improve ability to identify changes in lifestyle to reduce recurrence of condition, verbalize feelings, disclose and discuss suicidal ideas, demonstrate self-control, identify and develop effective coping behaviors, compliance with prescribed medications, identify triggers associated with substance abuse/mental health issues, participate in unit milieu and in scheduled group therapies   6. Long Term Goals: Improvement in symptoms so the patient is ready for discharge   --The risks/benefits/side-effects/alternatives to the medications above were  discussed in detail with the patient and time was given for questions. The patient provided informed consent.   -- Metabolic profile and EKG monitoring obtained while on an atypical antipsychotic and listed in the EHR    Total Time Spent in Direct Patient Care:  I personally spent 60 minutes on the unit in direct patient care. The direct patient care time included face-to-face time with the patient, reviewing the patient's chart, communicating with other professionals, and coordinating care. Greater than 50% of this time was spent in counseling or coordinating care with the patient regarding goals of hospitalization, psycho-education, and discharge planning needs.   I certify that inpatient services furnished can reasonably be expected to improve the patient's condition.    Criss Alvine, MD 05/29/2023, 11:26 AM      Portions of this note were created using voice recognition software. Minor syntax errors, grammatical content, spelling, or punctuation errors may have occurred unintentionally. Please notify the Thereasa Parkin if the meaning of any statement is unclear.

## 2023-05-29 NOTE — Plan of Care (Signed)
   Problem: Education: Goal: Knowledge of [facility] Education information/materials will improve Outcome: Progressing   Problem: Activity: Goal: Interest or engagement in activities will improve Outcome: Progressing   Problem: Coping: Goal: Ability to verbalize frustrations and anger appropriately will improve Outcome: Progressing
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
## 2023-05-29 NOTE — Group Note (Signed)
 Date:  05/29/2023 Time:  4:37 PM  Group Topic/Focus:  Rediscovering Joy:   The focus of this group is to explore various ways to relieve stress in a positive manner.    Participation Level:  Did Not Attend  Shela Nevin 05/29/2023, 4:37 PM

## 2023-05-30 ENCOUNTER — Encounter (HOSPITAL_COMMUNITY): Payer: Self-pay

## 2023-05-30 DIAGNOSIS — F332 Major depressive disorder, recurrent severe without psychotic features: Secondary | ICD-10-CM | POA: Diagnosis not present

## 2023-05-30 LAB — RPR: RPR Ser Ql: NONREACTIVE

## 2023-05-30 NOTE — [facility] Group Notes (Signed)
 [facility] Group Notes:  (Nursing/MHT/Case Management/Adjunct)  Date:  05/30/2023  Time:  2000  Type of Therapy:   Wrap up group  Participation Level:  Active  Participation Quality:  Appropriate and Attentive  Affect:  Appropriate  Cognitive:  Alert and Appropriate  Insight:  Appropriate  Engagement in Group:  Engaged  Modes of Intervention:  Discussion  Summary of Progress/Problems:  Fay Records 05/30/2023, 8:49 PM

## 2023-05-30 NOTE — Progress Notes (Signed)
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 pregnancy, 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01/12/2018), Tobacco use (09/18/2018), Type 2 diabetes mellitus complicating pregnancy in third trimester, 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01/05/2018), and UTI (urinary tract infection) (02/15/2023).  She presented on 05/28/2023  9:19 PM for MDD (major depressive disorder), recurrent severe, without psychosis (HCC).  She presented to the ED reporting suicidal ideation with a plan to overdose.  There is some concern that secondary gain of room and board is playing a factor in this admission.   Diagnoses / Active Problems:     Patient Active Problem List    Diagnosis Date Noted   GAD (generalized anxiety disorder) 02/13/2023   Nicotine dependence 02/13/2023   Cocaine use disorder (HCC)  02/13/2023   Alcohol use disorder 02/13/2023   Insomnia 02/13/2023   Suicidal ideation 11/07/2020   MDD (major depressive disorder), recurrent severe, 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   Type 2 diabetes mellitus without complication, without long-term current use of insulin (HCC) 11/06/2016      PLAN: Safety and Monitoring:             -- Voluntary admission to inpatient psychiatric unit for safety, stabilization and treatment             -- Daily contact with patient to assess and evaluate symptoms and progress in treatment             -- Patient's case to be discussed in multi-disciplinary team meeting             -- Observation Level : q15 minute checks             -- Vital signs:  q12 hours             -- Precautions: suicide, elopement, and assault   2. Psychiatric Diagnoses and Treatment:      Patient Active Problem List    Diagnosis Date Noted   GAD (generalized anxiety disorder) 02/13/2023   Nicotine dependence 02/13/2023   Cocaine use disorder (HCC) 02/13/2023   Alcohol use disorder 02/13/2023   Insomnia 02/13/2023   Suicidal ideation 11/07/2020   MDD (major depressive disorder), recurrent severe, 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   Type 2 diabetes mellitus without complication, without long-term current use of insulin (HCC) 11/06/2016      Scheduled Medications:  metFORMIN  1,000 mg Oral Daily   multivitamin with minerals  1 tablet Oral Daily   ondansetron  4 mg Oral Q6H   sertraline  50 mg Oral Daily   traZODone  100 mg Oral QHS        As Needed Medications: acetaminophen, albuterol, alum & mag hydroxide-simeth, haloperidol **AND** diphenhydrAMINE, haloperidol lactate **AND** diphenhydrAMINE **AND** LORazepam, haloperidol lactate **AND** diphenhydrAMINE **AND** LORazepam, magnesium hydroxide, menthol-cetylpyridinium, nicotine polacrilex     3. Medical Issues Being Addressed:  Labs reviewed, unremarkable with the exception of: Elevated white  blood cell count and platelets.  UDS is positive for amphetamines, cocaine, and THC.               Tobacco Use Disorder             -- Nicotine replacement as above             -- Smoking cessation encouraged   4. Discharge Planning:              -- Social work and case management to assist with discharge planning and identification of hospital follow-up needs prior to discharge             -- Estimated LOS: 5-7 days             -- Discharge Concerns: Need to establish a safety plan; Medication compliance and effectiveness             -- Discharge Goals: Return home with outpatient referrals for mental health follow-up including medication management/psychotherapy   5. Short Term Goals:  Improve ability to identify changes in lifestyle to reduce recurrence of condition, verbalize feelings, disclose and discuss suicidal ideas, demonstrate self-control, identify and develop effective coping behaviors, compliance with prescribed medications, identify triggers associated with substance abuse/mental health issues, participate in unit milieu and in scheduled group therapies    6. Long Term Goals: Improvement in symptoms so the patient is ready for discharge     --The risks/benefits/side-effects/alternatives to the medications above were discussed in detail with the patient and time was given for questions. The patient provided informed consent.              -- Metabolic profile and EKG monitoring obtained while on an atypical antipsychotic and listed in the EHR    Cecilie Lowers, FNP 05/30/2023, 4:31 PM

## 2023-05-30 NOTE — BH IP Treatment Plan (Signed)
 Interdisciplinary Treatment and Diagnostic Plan Update  05/30/2023 Time of Session: 1150am Grace Montgomery MRN: [id number]  Principal Diagnosis: MDD (major depressive disorder), recurrent severe, without psychosis (HCC)  Secondary Diagnoses: Principal Problem:   MDD (major depressive disorder), recurrent severe, without psychosis (HCC) Active Problems:   Type 2 diabetes mellitus without complication, without long-term current use of insulin (HCC)   Suicidal ideation   GAD (generalized anxiety disorder)   Nicotine dependence   Cocaine use disorder (HCC)   Alcohol use disorder   Insomnia   Current Medications:  Current Facility-Administered Medications  Medication Dose Route Frequency Provider Last Rate Last Admin   acetaminophen (TYLENOL) tablet 650 mg  650 mg Oral Q6H PRN Weber, Kyra A, NP   650 mg at 05/30/23 0914   albuterol (VENTOLIN HFA) 108 (90 Base) MCG/ACT inhaler 2 puff  2 puff Inhalation Q6H PRN Weber, Kyra A, NP       alum & mag hydroxide-simeth (MAALOX/MYLANTA) 200-200-20 MG/5ML suspension 30 mL  30 mL Oral Q4H PRN Weber, Kyra A, NP       haloperidol (HALDOL) tablet 5 mg  5 mg Oral TID PRN Weber, Kyra A, NP       And   diphenhydrAMINE (BENADRYL) capsule 50 mg  50 mg Oral TID PRN Weber, Kyra A, NP       haloperidol lactate (HALDOL) injection 5 mg  5 mg Intramuscular TID PRN Weber, Kyra A, NP       And   diphenhydrAMINE (BENADRYL) injection 50 mg  50 mg Intramuscular TID PRN Weber, Bella Kennedy A, NP       And   LORazepam (ATIVAN) injection 2 mg  2 mg Intramuscular TID PRN Weber, Kyra A, NP       haloperidol lactate (HALDOL) injection 10 mg  10 mg Intramuscular TID PRN Weber, Kyra A, NP       And   diphenhydrAMINE (BENADRYL) injection 50 mg  50 mg Intramuscular TID PRN Weber, Kyra A, NP       And   LORazepam (ATIVAN) injection 2 mg  2 mg Intramuscular TID PRN Weber, Kyra A, NP       magnesium hydroxide (MILK OF MAGNESIA) suspension 30 mL  30 mL Oral Daily PRN Weber, Kyra A, NP        menthol-cetylpyridinium (CEPACOL) lozenge 3 mg  1 lozenge Oral Q4H PRN Bobbitt, Shalon E, NP   3 mg at 05/28/23 2239   metFORMIN (GLUCOPHAGE) tablet 1,000 mg  1,000 mg Oral BID WC [place], Ene A, NP   1,000 mg at 05/30/23 0913   multivitamin with minerals tablet 1 tablet  1 tablet Oral Daily Weber, Kyra A, NP   1 tablet at 05/30/23 0913   nicotine polacrilex (NICORETTE) gum 2 mg  2 mg Oral PRN Golda Acre, MD       ondansetron [facility]) tablet 4 mg  4 mg Oral Q6H Weber, Kyra A, NP       sertraline (ZOLOFT) tablet 50 mg  50 mg Oral Daily Bobbitt, Shalon E, NP   50 mg at 05/30/23 0913   traZODone (DESYREL) tablet 100 mg  100 mg Oral QHS Bobbitt, Shalon E, NP   100 mg at 05/29/23 2134   PTA Medications: Medications Prior to Admission  Medication Sig Dispense Refill Last Dose/Taking   QUEtiapine (SEROQUEL) 100 MG tablet Take 100 mg by mouth at bedtime.   Taking   albuterol (VENTOLIN HFA) 108 (90 Base) MCG/ACT inhaler Inhale 2 puffs into the  lungs every 6 (six) hours as needed for wheezing or shortness of breath. 1 each 0    metFORMIN (GLUCOPHAGE) 1000 MG tablet Take 1,000 mg by mouth 2 (two) times daily with a meal.      sertraline (ZOLOFT) 50 MG tablet Take 50 mg by mouth daily.      traZODone (DESYREL) 100 MG tablet Take 100 mg by mouth at bedtime.       Patient Stressors: Financial difficulties   Marital or family conflict   Occupational concerns   Substance abuse    Patient Strengths: Manufacturing systems engineer   Treatment Modalities: Medication Management, Group therapy, Case management,  1 to 1 session with clinician, Psychoeducation, Recreational therapy.   Physician Treatment Plan for Primary Diagnosis: MDD (major depressive disorder), recurrent severe, without psychosis (HCC) Long Term Goal(s):     Short Term Goals:    Medication Management: Evaluate patient's response, side effects, and tolerance of medication regimen.  Therapeutic Interventions: 1 to 1 sessions, Unit  Group sessions and Medication administration.  Evaluation of Outcomes: Not Progressing  Physician Treatment Plan for Secondary Diagnosis: Principal Problem:   MDD (major depressive disorder), recurrent severe, without psychosis (HCC) Active Problems:   Type 2 diabetes mellitus without complication, without long-term current use of insulin (HCC)   Suicidal ideation   GAD (generalized anxiety disorder)   Nicotine dependence   Cocaine use disorder (HCC)   Alcohol use disorder   Insomnia  Long Term Goal(s):     Short Term Goals:       Medication Management: Evaluate patient's response, side effects, and tolerance of medication regimen.  Therapeutic Interventions: 1 to 1 sessions, Unit Group sessions and Medication administration.  Evaluation of Outcomes: Not Progressing   RN Treatment Plan for Primary Diagnosis: MDD (major depressive disorder), recurrent severe, without psychosis (HCC) Long Term Goal(s): Knowledge of disease and therapeutic regimen to maintain health will improve  Short Term Goals: Ability to remain free from injury will improve, Ability to verbalize frustration and anger appropriately will improve, Ability to demonstrate self-control, Ability to participate in decision making will improve, Ability to verbalize feelings will improve, Ability to disclose and discuss suicidal ideas, Ability to identify and develop effective coping behaviors will improve, and Compliance with prescribed medications will improve  Medication Management: RN will administer medications as ordered by provider, will assess and evaluate patient's response and provide education to patient for prescribed medication. RN will report any adverse and/or side effects to prescribing provider.  Therapeutic Interventions: 1 on 1 counseling sessions, Psychoeducation, Medication administration, Evaluate responses to treatment, Monitor vital signs and CBGs as ordered, Perform/monitor CIWA, COWS, AIMS and Fall  Risk screenings as ordered, Perform wound care treatments as ordered.  Evaluation of Outcomes: Not Progressing   LCSW Treatment Plan for Primary Diagnosis: MDD (major depressive disorder), recurrent severe, without psychosis (HCC) Long Term Goal(s): Safe transition to appropriate next level of care at discharge, Engage patient in therapeutic group addressing interpersonal concerns.  Short Term Goals: Engage patient in aftercare planning with referrals and resources, Increase social support, Increase ability to appropriately verbalize feelings, Increase emotional regulation, Facilitate acceptance of mental health diagnosis and concerns, Facilitate patient progression through stages of change regarding substance use diagnoses and concerns, Identify triggers associated with mental health/substance abuse issues, and Increase skills for wellness and recovery  Therapeutic Interventions: Assess for all discharge needs, 1 to 1 time with Social worker, Explore available resources and support systems, Assess for adequacy in community support network, Educate family and  significant other(s) on suicide prevention, Complete Psychosocial Assessment, Interpersonal group therapy.  Evaluation of Outcomes: Not Progressing   Progress in Treatment: Attending groups: No. Participating in groups: No. Taking medication as prescribed: Yes. Toleration medication: Yes. Family/Significant other contact made: No, will contact:  pt declined consent Patient understands diagnosis: Yes. Discussing patient identified problems/goals with staff: Yes. Medical problems stabilized or resolved: Yes. Denies suicidal/homicidal ideation: Yes. Issues/concerns per patient self-inventory: No.  New problem(s) identified: No, Describe:  none  New Short Term/Long Term Goal(s): medication stabilization, elimination of SI thoughts, development of comprehensive mental wellness plan.    Patient Goals:  "Learn coping skills for my  substance use, find a place to live"  Discharge Plan or Barriers: Patient recently admitted. CSW will continue to follow and assess for appropriate referrals and possible discharge planning.    Reason for Continuation of Hospitalization: Anxiety Depression Medication stabilization Suicidal ideation  Estimated Length of Stay: 5-7 days  Last 3 [place] Suicide Severity Risk Score: Flowsheet Row Ad[facility] (Current) [facility] from 05/27/2023 in [facility] at [facility] ED from 05/12/2023 in [facility] at [facility]  C-SSRS RISK CATEGORY High Risk High Risk High Risk       Last [facility] 2/9 Scores:    02/18/2023   10:32 AM 02/09/2023   11:35 AM 07/30/2021    2:27 PM  Depression screen PHQ 2/9  Decreased Interest 3 3 0  Down, Depressed, Hopeless 3 3 0  PHQ - 2 Score 6 6 0  Altered sleeping 2 3 0  Tired, decreased energy 0  0  Change in appetite 2 3 0  Feeling bad or failure about yourself  3 3 0  Trouble concentrating 2 3 0  Moving slowly or fidgety/restless 1 3 0  Suicidal thoughts 0 3 0  PHQ-9 Score 16 24 0  Difficult doing work/chores Very difficult  Not difficult at all    Scribe for Treatment Team: Kathi Der, LCSWA 05/30/2023 2:55 PM

## 2023-05-30 NOTE — Progress Notes (Signed)
   05/29/23 2135  Psych Admission Type (Psych Patients Only)  Admission Status Involuntary  Psychosocial Assessment  Patient Complaints Depression;Worrying  Eye Contact Brief  Facial Expression Flat  Affect Appropriate to circumstance;Depressed;Preoccupied  Speech Logical/coherent  Interaction Assertive  Motor Activity Slow  Appearance/Hygiene Unremarkable  Behavior Characteristics Cooperative;Calm  Mood Depressed  Thought Process  Coherency WDL  Content WDL  Delusions None reported or observed  Perception WDL  Hallucination None reported or observed  Judgment Limited  Confusion None  Danger to Self  Current suicidal ideation? Denies

## 2023-05-30 NOTE — Group Note (Signed)
 Recreation Therapy Group Note   Group Topic:Leisure Education  Group Date: 05/30/2023 Start Time: 0930 End Time: 1000 Facilitators: Aron Needles-McCall, LRT,CTRS Location: [address] Dayroom   Group Topic: Leisure Education  Goal Area(s) Addresses:  Patient will identify positive leisure activities for use post discharge. Patient will identify at least one positive benefit of participation in leisure activities.   Intervention: Innovation, Group Presentation   Activity: LRT and patients set room in a way that would be effective in completing activity. Patients were seated in a circle and given a beach ball. Patients were instructed they were to hit the ball back and forth to each other as if playing volleyball. LRT would time the patients as they played the game. The ball could bounce or roll but not come to a complete stop. It the ball came to a stop at any time, the timer would reset and start over. Patients were to remain seated throughout activity but could get up if the ball escaped the circle at any point.  Education:  Leisure Scientist, physiological, Special educational needs teacher, Teamwork, Discharge Planning  Education Outcome: Acknowledges education/In group clarification offered/Needs additional education.    Affect/Mood: N/A   Participation Level: Did not attend    Clinical Observations/Individualized Feedback:      Plan: Continue to engage patient in RT group sessions 2-3x/week.   Terence Bart-McCall, LRT,CTRS 05/30/2023 12:48 PM

## 2023-05-30 NOTE — Progress Notes (Addendum)
 D. Pt presents with sad affect/ depressed mood-mildly irritable upon initial approach at the med window this morning..Pt currently denies SI/HI and AVH and does not appear to be responding to internal stimuli. A. Labs and vitals monitored. Pt given and educated on medications. Pt supported emotionally and encouraged to express concerns and ask questions.   R. Pt remains safe with 15 minute checks. Will continue POC.    05/30/23 0900  Psych Admission Type (Psych Patients Only)  Admission Status Involuntary  Psychosocial Assessment  Patient Complaints Depression;Anxiety  Eye Contact Brief  Facial Expression Sad  Affect Appropriate to circumstance  Speech Logical/coherent  Interaction Assertive  Motor Activity Slow  Appearance/Hygiene Unremarkable  Behavior Characteristics Cooperative;Calm  Mood Depressed  Thought Process  Coherency WDL  Content WDL  Delusions None reported or observed  Perception WDL  Hallucination None reported or observed  Judgment Limited  Confusion None  Danger to Self  Current suicidal ideation? Denies  Danger to Others  Danger to Others None reported or observed

## 2023-05-30 NOTE — Group Note (Signed)
 Date:  05/30/2023 Time:  4:57 PM  Group Topic/Focus:  Self Care:   The focus of this group is to help patients understand the importance of self-care in order to improve or restore emotional, physical, spiritual, interpersonal, and financial health.    Participation Level:  Active  Participation Quality:  Appropriate  Affect:  Appropriate  Cognitive:  Appropriate  Insight: Appropriate  Engagement in Group:  Engaged  Modes of Intervention:  Education  Additional Comments:   Pt attended the Physical Wellness group.   Edmund Hilda Catrina Fellenz 05/30/2023, 4:57 PM

## 2023-05-30 NOTE — Group Note (Signed)
 Date:  05/30/2023 Time:  4:11 PM  Group Topic/Focus:  Goals Group:   The focus of this group is to help patients establish daily goals to achieve during treatment and discuss how the patient can incorporate goal setting into their daily lives to aide in recovery. Orientation:   The focus of this group is to educate the patient on the purpose and policies of crisis stabilization and provide a format to answer questions about their admission.  The group details unit policies and expectations of patients while admitted.    Participation Level:  Did Not Attend  Participation Quality:   n/a  Affect:   n/a  Cognitive:   n/a  Insight: None  Engagement in Group:   n/a  Modes of Intervention:   n/a  Additional Comments:   Did not attend  Breindy Meadow M Sharunda Salmon 05/30/2023, 4:11 PM

## 2023-05-30 NOTE — Plan of Care (Signed)
  Problem: Activity: Goal: Interest or engagement in activities will improve Outcome: Progressing   Problem: Coping: Goal: Ability to demonstrate self-control will improve Outcome: Progressing   

## 2023-05-30 NOTE — Progress Notes (Incomplete)
 33 year old African-American female with extensive history of alcohol use disorder.  She has been chronically homeless.  She lost custody of her 4 children.  Self-presented seeking help with her alcohol use.  She seems motivated to stay sober as she is considering in-house addiction treatment.  No dangerousness.  Depression is controlled with current regimen.  She is tolerating her current regimen well.  We will keep her medicine the same and evaluate her further.

## 2023-05-30 NOTE — Group Note (Signed)
 Date:  05/30/2023 Time:  4:42 PM  Group Topic/Focus:  Emotional Education:   The focus of this group is to discuss what feelings/emotions are, and how they are experienced. Managing Feelings:   The focus of this group is to identify what feelings patients have difficulty handling and develop a plan to handle them in a healthier way upon discharge.    Participation Level:  Did Not Attend  Participation Quality:   n/a  Affect:   n/a  Cognitive:   n/a  Insight: None  Engagement in Group:   n/a  Modes of Intervention:   n/a  Additional Comments:   Did not attend  Grace Montgomery 05/30/2023, 4:42 PM

## 2023-05-31 DIAGNOSIS — F332 Major depressive disorder, recurrent severe without psychotic features: Secondary | ICD-10-CM | POA: Diagnosis not present

## 2023-05-31 MED ORDER — QUETIAPINE FUMARATE 100 MG PO TABS
100.0000 mg | ORAL_TABLET | Freq: Every day | ORAL | Status: DC
  Administered 2023-05-31 – 2023-06-01 (×2): 100 mg via ORAL
  Filled 2023-05-31: qty 14
  Filled 2023-05-31 (×3): qty 1

## 2023-05-31 NOTE — Progress Notes (Cosign Needed Addendum)
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   Type 2 diabetes mellitus without complication, without long-term current use of insulin (HCC) 11/06/2016    PLAN: Safety and Monitoring:             -- Voluntary admission to inpatient psychiatric unit for safety, stabilization and treatment             -- Daily contact with patient to assess and evaluate symptoms and progress in treatment             -- Patient's case to be discussed in multi-disciplinary team meeting             -- Observation Level : q15 minute checks             -- Vital signs:  q12 hours             -- Precautions: suicide, elopement, and assault   2. Psychiatric Diagnoses and Treatment:      Patient Active Problem List    Diagnosis Date Noted   GAD (generalized  anxiety disorder) 02/13/2023   Nicotine dependence 02/13/2023   Cocaine use disorder (HCC) 02/13/2023   Alcohol use disorder 02/13/2023   Insomnia 02/13/2023   Suicidal ideation 11/07/2020   MDD (major depressive disorder), recurrent severe, 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   Type 2 diabetes mellitus without complication, without long-term current use of insulin (HCC) 11/06/2016      Scheduled Medications:  metFORMIN  1,000 mg Oral Daily   multivitamin with minerals  1 tablet Oral Daily   ondansetron  4 mg Oral Q6H   sertraline  50 mg Oral Daily   traZODone  100 mg Oral QHS    .    Initiate Seroquel 100 mg po at bedtime for sleep 05/31/23    As Needed Medications: acetaminophen, albuterol, alum & mag hydroxide-simeth, haloperidol **AND** diphenhydrAMINE, haloperidol lactate **AND** diphenhydrAMINE **AND** LORazepam, haloperidol lactate **AND** diphenhydrAMINE **AND** LORazepam, magnesium hydroxide, menthol-cetylpyridinium, nicotine polacrilex     3. Medical Issues Being Addressed:               Labs reviewed, unremarkable with the exception of: Elevated white blood cell count and platelets.  UDS is positive for amphetamines, cocaine, and THC.               Tobacco Use Disorder             -- Nicotine replacement as above             -- Smoking cessation encouraged   4. Discharge Planning:              -- Social work and case management to assist with discharge planning and identification of hospital follow-up needs prior to discharge             -- Estimated LOS: 5-7 days             --  Discharge Concerns: Need to establish a safety plan; Medication compliance and effectiveness             -- Discharge Goals: Return home with outpatient referrals for mental health follow-up including medication management/psychotherapy   5. Short Term Goals:  Improve ability to identify changes in lifestyle to reduce recurrence of condition, verbalize feelings, disclose and discuss suicidal  ideas, demonstrate self-control, identify and develop effective coping behaviors, compliance with prescribed medications, identify triggers associated with substance abuse/mental health issues, participate in unit milieu and in scheduled group therapies    6. Long Term Goals: Improvement in symptoms so the patient is ready for discharge   --The risks/benefits/side-effects/alternatives to the medications above were discussed in detail with the patient and time was given for questions. The patient provided informed consent.              -- Metabolic profile and EKG monitoring obtained while on an atypical antipsychotic and listed in the EHR  Cecilie Lowers, FNP 05/31/2023, 4:13 PM Patient ID: Grace Montgomery, female   DOB: 1990/10/15, 33 y.o.   MRN: [id number]

## 2023-05-31 NOTE — Progress Notes (Signed)
   05/31/23 2300  Psych Admission Type (Psych Patients Only)  Admission Status Involuntary  Psychosocial Assessment  Patient Complaints Anxiety;Depression  Eye Contact Fair  Facial Expression Anxious  Affect Appropriate to circumstance  Speech Logical/coherent  Interaction Assertive  Motor Activity Other (Comment) (WNL)  Appearance/Hygiene Unremarkable  Behavior Characteristics Cooperative;Calm  Mood Depressed  Thought Process  Coherency WDL  Content WDL  Delusions None reported or observed  Perception WDL  Hallucination None reported or observed  Judgment Limited  Confusion None  Danger to Self  Current suicidal ideation? Denies  Description of Suicide Plan None  Agreement Not to Harm Self Yes  Description of Agreement Verbal agreement  Danger to Others  Danger to Others None reported or observed

## 2023-05-31 NOTE — Group Note (Signed)
 LCSW Group Therapy Note   Group Date: 05/31/2023 Start Time: 1100 End Time: 1200   Participation:  patient was present  Type of Therapy:  Group Therapy  Title:  Speaking from the Heart: Communicating with Understanding and Empathy  Objective:  To help participants develop effective communication skills to express themselves clearly, listen actively, and navigate conflicts in a healthy way.  Goals: Increase awareness of verbal and non-verbal communication skills. Practice using "I" statements and active listening techniques. Learn coping strategies for managing communication stress.  Summary: Participants explored the importance of communication, discussed challenges, and practiced skills such as active listening and assertive expression. They reflected on past experiences and identified ways to improve communication in their daily lives.  Therapeutic Modalities: Cognitive-Behavioral Therapy (CBT): Restructuring negative thought patterns in communication. Mindfulness: Staying present and calm during conversations.   Alla Feeling, LCSWA 05/31/2023  12:38 PM

## 2023-05-31 NOTE — Progress Notes (Addendum)
 Patient states she was taking Seroquel 100 mg PO at night as a home medication. She states she would like to continue if possible.

## 2023-05-31 NOTE — Progress Notes (Addendum)
  Karon D Orrico   Type of Note: Substance use treatment  [facility] - faxed 3/24 - pt was given phone number to call and complete phone screen.   [facility] - faxed 3/24  Signed:  Leonid Manus, LCSW-A 05/31/2023  7:58 AM

## 2023-05-31 NOTE — Plan of Care (Signed)
   Problem: Education: Goal: Emotional status will improve Outcome: Progressing Goal: Mental status will improve Outcome: Progressing Goal: Verbalization of understanding the information provided will improve Outcome: Progressing   Problem: Activity: Goal: Interest or engagement in activities will improve Outcome: Progressing Goal: Sleeping patterns will improve Outcome: Progressing

## 2023-05-31 NOTE — BHH Group Notes (Signed)
 BHH Group Notes:  (Nursing/MHT/Case Management/Adjunct)  Date:  05/31/2023  Time:  9:49 PM  Type of Therapy:   Wrap-up group  Participation Level:  Active  Participation Quality:  Appropriate  Affect:  Appropriate  Cognitive:  Appropriate  Insight:  Appropriate  Engagement in Group:  Engaged  Modes of Intervention:  Education  Summary of Progress/Problems: Goal to find treatment center for SA. Rated day 6/10.  Grace Montgomery 05/31/2023, 9:49 PM

## 2023-05-31 NOTE — Plan of Care (Signed)
   Problem: Education: Goal: Knowledge of [facility] Education information/materials will improve Outcome: Progressing Goal: Emotional status will improve Outcome: Progressing Goal: Mental status will improve Outcome: Progressing Goal: Verbalization of understanding the information provided will improve Outcome: Progressing

## 2023-05-31 NOTE — BHH Group Notes (Signed)

## 2023-05-31 NOTE — Progress Notes (Signed)
   05/31/23 0000  Psych Admission Type (Psych Patients Only)  Admission Status Involuntary  Psychosocial Assessment  Patient Complaints Anxiety  Eye Contact Fair  Facial Expression Anxious  Affect Appropriate to circumstance  Speech Logical/coherent  Interaction Assertive  Motor Activity Other (Comment) (WNL)  Appearance/Hygiene Unremarkable  Behavior Characteristics Cooperative;Calm  Mood Depressed  Thought Process  Coherency WDL  Content WDL  Delusions None reported or observed  Perception WDL  Hallucination None reported or observed  Judgment Limited  Confusion None  Danger to Self  Current suicidal ideation? Denies  Description of Suicide Plan None  Agreement Not to Harm Self Yes  Description of Agreement Verbal agreement  Danger to Others  Danger to Others None reported or observed

## 2023-05-31 NOTE — Group Note (Signed)
 Recreation Therapy Group Note   Group Topic:Animal Assisted Therapy   Group Date: 05/31/2023 Start Time: 0945 End Time: 1031 Facilitators: Rishard Delange-McCall, LRT,CTRS Location: [address] Dayroom   Animal-Assisted Activity (AAA) Program Checklist/Progress Notes Patient Eligibility Criteria Checklist & Daily Group note for Rec Tx Intervention  AAA/T Program Assumption of Risk Form signed by Patient/ or Parent Legal Guardian Yes  Patient is free of allergies or severe asthma Yes  Patient reports no fear of animals Yes  Patient reports no history of cruelty to animals Yes  Patient understands his/her participation is voluntary Yes  Patient washes hands before animal contact Yes  Patient washes hands after animal contact Yes  Education: Hand Washing, Appropriate Animal Interaction   Education Outcome: Acknowledges education.    Affect/Mood: Appropriate   Participation Level: Engaged   Participation Quality: Independent   Behavior: Appropriate   Speech/Thought Process: Focused   Insight: Good   Judgement: Good   Modes of Intervention: Teaching laboratory technician   Patient Response to Interventions:  Engaged   Education Outcome:  In group clarification offered    Clinical Observations/Individualized Feedback: Patient attended session and interacted appropriately with therapy dog and peers. Patient asked appropriate questions about therapy dog and his training. Patient shared stories about their pets at home with group.      Plan: Continue to engage patient in RT group sessions 2-3x/week.   Kaion Tisdale-McCall, LRT,CTRS 05/31/2023 1:35 PM

## 2023-05-31 NOTE — Progress Notes (Signed)
   05/31/23 0900  Psych Admission Type (Psych Patients Only)  Admission Status Involuntary  Psychosocial Assessment  Patient Complaints Anxiety;Depression  Eye Contact Fair  Facial Expression Anxious  Affect Appropriate to circumstance  Speech Logical/coherent  Interaction Assertive  Motor Activity Other (Comment) (wnl)  Appearance/Hygiene Unremarkable  Behavior Characteristics Cooperative;Calm  Mood Depressed;Anxious  Thought Process  Coherency WDL  Content WDL  Delusions None reported or observed  Perception WDL  Hallucination None reported or observed  Judgment Limited  Confusion None  Danger to Self  Current suicidal ideation? Denies  Description of Suicide Plan none  Agreement Not to Harm Self Yes  Description of Agreement verbal  Danger to Others  Danger to Others None reported or observed

## 2023-05-31 NOTE — Progress Notes (Signed)
  Grace Montgomery   Type of Note: [facility]   Received call from [place] with [facility], pt was accepted.  Pt reports being interested in going to [facility] at discharge.  MD reports pt will be ready to admit there on Thursday morning. Called to speak with Elon Jester but she has left for the day and will be back tomorrow morning. LVM.  Will continue to assist.  Signed:  Titan Karner, LCSW-A 05/31/2023  1:06 PM

## 2023-06-01 DIAGNOSIS — F332 Major depressive disorder, recurrent severe without psychotic features: Secondary | ICD-10-CM | POA: Diagnosis not present

## 2023-06-01 MED ORDER — NICOTINE 14 MG/24HR TD PT24
14.0000 mg | MEDICATED_PATCH | Freq: Every day | TRANSDERMAL | Status: DC | PRN
  Filled 2023-06-01: qty 14

## 2023-06-01 NOTE — BHH Group Notes (Signed)
 B[facility] Group Notes:  (Nursing/MHT/Case Management/Adjunct)  Date:  06/01/2023  Time:  2000  Type of Therapy:   NA  Participation Level:  Active  Participation Quality:  Appropriate  Affect:  Appropriate  Cognitive:  Alert  Insight:  Appropriate  Engagement in Group:  Engaged  Modes of Intervention:  Education  Summary of Progress/Problems: Attended NA Meeting  Rafael Salway M Majour Frei 06/01/2023, 2000

## 2023-06-01 NOTE — Plan of Care (Signed)
  Problem: Education: Goal: Emotional status will improve Outcome: Progressing Goal: Verbalization of understanding the information provided will improve Outcome: Progressing   Problem: Coping: Goal: Ability to verbalize frustrations and anger appropriately will improve Outcome: Progressing   Problem: Health Behavior/Discharge Planning: Goal: Identification of resources available to assist in meeting health care needs will improve Outcome: Progressing   Problem: Safety: Goal: Periods of time without injury will increase Outcome: Progressing

## 2023-06-01 NOTE — Progress Notes (Signed)
  [facility] Adult Case Management Discharge Plan :  Will you be returning to the same living situation after discharge:  No. Pt is discharging to [facility]  At discharge, do you have transportation home?: Yes,  CSW arranged [employer] for 3/27 @ 0815 Do you have the ability to pay for your medications: Yes,  pt has insurance, samples and scripts will be sent to facility with pt as well  Release of information consent forms completed and in the chart;  Patient's signature needed at discharge.  Patient to Follow up at:  Follow-up Information     [facility]. Go to.   Specialty: Professional Counselor Why: Please go to this provider for therapy services Monday through Friday, from 9 am to 1 pm after completing [employer]. Contact information: [address] [place] [address] [phone]         [facility]. Go to.   Specialty: Behavioral Health Why: Please go to this provider for medication management services on Monday through Friday, arrive by 7:00 am after completing [facility]. Contact information: [address] [place] [address] [phone]        [facility]. Go to.   Why: You have been accepted to this program for substance use treatment. You will admit there on 06/02/23 by 9:00AM. You will receive therapy and medication management while there. Contact information: [address] [place] [address] [phone]                 Next level of care provider has access to [facility] Link:no  Safety Planning and Suicide Prevention discussed: No. Pt declined     Has patient been referred to the Quitline?: Patient refused referral for treatment  Patient has been referred for addiction treatment: Patient discharging to [facility] for SA treatment   Kathi Der, LCSWA 06/01/2023, 3:04 PM

## 2023-06-01 NOTE — Progress Notes (Signed)
   06/01/23 2200  Psych Admission Type (Psych Patients Only)  Admission Status Involuntary  Psychosocial Assessment  Patient Complaints Anxiety;Depression  Eye Contact Fair  Facial Expression Anxious  Affect Appropriate to circumstance  Speech Logical/coherent  Interaction Assertive  Motor Activity Other (Comment) (WNL)  Appearance/Hygiene Unremarkable  Behavior Characteristics Cooperative;Calm  Mood Depressed  Thought Process  Coherency WDL  Content WDL  Delusions None reported or observed  Perception WDL  Hallucination None reported or observed  Judgment Limited  Confusion None  Danger to Self  Current suicidal ideation? Denies  Description of Suicide Plan None  Agreement Not to Harm Self Yes  Description of Agreement Verbal agreement  Danger to Others  Danger to Others None reported or observed

## 2023-06-01 NOTE — BHH Suicide Risk Assessment (Signed)
 [facility] Discharge Suicide Risk Assessment   Principal Problem: MDD (major depressive disorder), recurrent severe, without psychosis (HCC) Discharge Diagnoses: Principal Problem:   MDD (major depressive disorder), recurrent severe, without psychosis (HCC) Active Problems:   Type 2 diabetes mellitus without complication, without long-term current use of insulin (HCC)   Suicidal ideation   GAD (generalized anxiety disorder)   Nicotine dependence   Cocaine use disorder (HCC)   Alcohol use disorder   Insomnia   Total Time spent with patient: 30 minutes  Musculoskeletal: Strength & Muscle Tone: within normal limits Gait & Station: normal Patient leans: N/A  Psychiatric Specialty Exam  Presentation  General Appearance:  Overweight, not in any distress, good relatedness.  Appropriate behavior.   Eye Contact: Good.   Speech: Spontaneous.  Normal rate, tone and volume.   Mood and Affect  Mood: Euthymic.   Affect: Full range and mood congruent.   Thought Process  Thought Processes: Goal directed.   Descriptions of Associations:Intact   Orientation:Full (Time, Place and Person)   Thought Content: No suicidal thoughts.  No homicidal thoughts.  No thoughts of violence.  No negative rumination.  No guilty ruminations.  No obsessions.  No delusional theme.  Hallucinations: No hallucination in any modality.     Sensorium  Memory: Immediate Good   Judgment: Good.   Insight: Good.     Executive Functions  Concentration: Good.   Attention Span: Fair   Recall: Good.   Fund of Knowledge: Good.   Language: Good.   Psychomotor Activity  Normal psychomotor activity.   Physical Exam: Physical Exam ROS Blood pressure 109/79, pulse 98, temperature 98.9 F (37.2 C), temperature source Oral, resp. rate 19, height 5\' 3"  (1.6 m), weight 88.3 kg, last menstrual period 05/21/2023, SpO2 100%. Body mass index is 34.47 kg/m.  Mental Status Per Nursing  Assessment::   On Admission:  NA  Demographic Factors:  Adolescent or young adult  Loss Factors: Financial problems/change in socioeconomic status  Historical Factors: Family history of mental illness or substance abuse and Impulsivity  Risk Reduction Factors:   Sense of responsibility to family, Positive therapeutic relationship, and Positive coping skills or problem solving skills  Continued Clinical Symptoms:  Alcohol/Substance Abuse/Dependencies  Cognitive Features That Contribute To Risk:  None    Suicide Risk:  Minimal: No identifiable suicidal ideation.  Patients presenting with no risk factors but with morbid ruminations; may be classified as minimal risk based on the severity of the depressive symptoms   Follow-up Information     [place] Go to.   Specialty: Professional Counselor Why: Please go to this provider for therapy services Monday through Friday, from 9 am to 1 pm after completing [employer]. Contact information: [address] [place] [address] [phone]         [facility]. Go to.   Specialty: Behavioral Health Why: Please go to this provider for medication management services on Monday through Friday, arrive by 7:00 am after completing [facility] programming. Contact information: [address] [place] [address] [phone]        [facility]. Go to.   Why: You have been accepted to this program for substance use treatment. You will admit there on 06/02/23 by 9:00AM. You will receive therapy and medication management while there. Contact information: [address] [place] [address] [phone]                 Plan  Of Care/Follow-up recommendations:  See discharge summary.  Georgiann Cocker, MD 06/01/2023, 3:49 PM

## 2023-06-01 NOTE — Transportation (Signed)
 06/01/2023  Grace Montgomery DOB: 1990/03/19 MRN: [id number]   RIDER WAIVER AND RELEASE OF LIABILITY  For the purposes of helping with transportation needs, [facility] partners with outside transportation providers (taxi companies, [place], Catering manager.) to give [employer] patients or other approved people the choice of on-demand rides [facility]") to our buildings for non-emergency visits.  By using [employer], I, the person signing this document, on behalf of myself and/or any legal minors (in my care using the [employer]), agree:  Science writer given to me are supplied by independent, outside transportation providers who do not work for, or have any affiliation with, [employer]. [place] is not a transportation company. [facility] has no control over the quality or safety of the rides I get using [employer]. [facility] has no control over whether any outside ride will happen on time or not. [facility] gives no guarantee on the reliability, quality, safety, or availability on any rides, or that no mistakes will happen. I know and accept that traveling by vehicle (car, truck, SVU, Zenaida Niece, bus, taxi, etc.) has risks of serious injuries such as disability, being paralyzed, and death. I know and agree the risk of using [employer] is mine alone, and not [employer]. Transport Services are provided "as is" and as are available. The transportation providers are in charge for all inspections and care of the vehicles used to provide these rides. I agree not to take legal action against [facility], its agents, employees, officers, directors, representatives, insurers, attorneys, assigns, successors, subsidiaries, and affiliates at any time for any reasons related directly or indirectly to using [employer]. I also agree not to take legal action against [facility] or its affiliates for any injury, death, or damage to property caused by or related to using  [employer]. I have read this Waiver and Release of Liability, and I understand the terms used in it and their legal meaning. This Waiver is freely and voluntarily given with the understanding that my right (or any legal minors) to legal action against [facility] relating to [employer] is knowingly given up to use these services.   I attest that I read the Ride Waiver and Release of Liability to Grace Montgomery, gave [name] the opportunity to ask questions and answered the questions asked (if any). I affirm that Grace Montgomery then provided consent for assistance with transportation.

## 2023-06-01 NOTE — Group Note (Signed)
 Recreation Therapy Group Note   Group Topic:Communication  Group Date: 06/01/2023 Start Time: 0935 End Time: 1012 Facilitators: Sephira Zellman-McCall, LRT,CTRS Location: [address]   Group Topic: Communication, Problem Solving   Goal Area(s) Addresses:  Patient will effectively listen to complete activity.  Patient will identify communication skills used to make activity successful.  Patient will identify how skills used during activity can be used to reach post d/c goals.    Intervention: Building surveyor Activity - Geometric pattern cards, pencils, blank paper    Activity: Geometric Drawings.  Three volunteers from the peer group will be shown an abstract picture with a particular arrangement of geometrical shapes.  Each round, one 'speaker' will describe the pattern, as accurately as possible without revealing the image to the group.  The remaining group members will listen and draw the picture to reflect how it is described to them. Patients with the role of 'listener' cannot ask clarifying questions but, may request that the speaker repeat a direction. Once the drawings are complete, the presenter will show the rest of the group the picture and compare how close each person came to drawing the picture. LRT will facilitate a post-activity discussion regarding effective communication and the importance of planning, listening, and asking for clarification in daily interactions with others.  Education: Environmental consultant, Active listening, Support systems, Discharge planning  Education Outcome: Acknowledges understanding/In group clarification offered/Needs additional education.    Affect/Mood: N/A   Participation Level: Did not attend    Clinical Observations/Individualized Feedback:     Plan: Continue to engage patient in RT group sessions 2-3x/week.   Kory Panjwani-McCall, LRT,CTRS 06/01/2023 12:34 PM

## 2023-06-01 NOTE — Plan of Care (Signed)
   Problem: Education: Goal: Knowledge of [facility] Education information/materials will improve Outcome: Progressing Goal: Emotional status will improve Outcome: Progressing Goal: Mental status will improve Outcome: Progressing Goal: Verbalization of understanding the information provided will improve Outcome: Progressing

## 2023-06-01 NOTE — Progress Notes (Signed)
   06/01/23 0900  Psych Admission Type (Psych Patients Only)  Admission Status Involuntary  Psychosocial Assessment  Patient Complaints Anxiety;Depression  Eye Contact Fair  Facial Expression Anxious  Affect Appropriate to circumstance  Speech Logical/coherent  Interaction Assertive  Motor Activity Other (Comment) (WNL)  Appearance/Hygiene Unremarkable  Behavior Characteristics Cooperative;Calm  Mood Depressed  Thought Process  Coherency WDL  Content WDL  Delusions None reported or observed  Perception WDL  Hallucination None reported or observed  Judgment Limited  Confusion None  Danger to Self  Current suicidal ideation? Denies  Agreement Not to Harm Self Yes  Description of Agreement verbal  Danger to Others  Danger to Others None reported or observed
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
## 2023-06-01 NOTE — Group Note (Signed)
 Date:  06/01/2023 Time:  4:24 PM  Group Topic/Focus:  Goals Group:   The focus of this group is to help patients establish daily goals to achieve during treatment and discuss how the patient can incorporate goal setting into their daily lives to aide in recovery. Orientation:   The focus of this group is to educate the patient on the purpose and policies of crisis stabilization and provide a format to answer questions about their admission.  The group details unit policies and expectations of patients while admitted.    Participation Level:  Did Not Attend  Participation Quality:   n/a  Affect:   n/a  Cognitive:   n/a  Insight: None  Engagement in Group:   n/a  Modes of Intervention:   n/a  Additional Comments:   Did not attend  Stark Bray 06/01/2023, 4:24 PM

## 2023-06-01 NOTE — Group Note (Signed)
 Date:  06/01/2023 Time:  4:47 PM  Group Topic/Focus:  Dimensions of Wellness:   The focus of this group is to introduce the topic of wellness and discuss the role each dimension of wellness plays in total health.    Participation Level:  Did Not Attend  Participation Quality:   n/a  Affect:   n/a  Cognitive:   n/a  Insight: None  Engagement in Group:   n/a  Modes of Intervention:   n/a  Additional Comments:   Did not attend  Stark Bray 06/01/2023, 4:47 PM

## 2023-06-01 NOTE — Progress Notes (Signed)
  Grace Montgomery   Type of Note: [facility]  Pt will admit to [facility] tomorrow morning by 9:00AM. MD aware.   Signed:  Alysiah Suppa, LCSW-A 06/01/2023  10:34 AM

## 2023-06-02 DIAGNOSIS — F332 Major depressive disorder, recurrent severe without psychotic features: Secondary | ICD-10-CM | POA: Diagnosis not present

## 2023-06-02 MED ORDER — NICOTINE POLACRILEX 2 MG MT GUM: 2.0000 mg | CHEWING_GUM | OROMUCOSAL | 0 refills | Status: DC | PRN

## 2023-06-02 MED ORDER — SERTRALINE HCL 50 MG PO TABS: 50.0000 mg | ORAL_TABLET | Freq: Every day | ORAL | 0 refills | Status: DC

## 2023-06-02 MED ORDER — QUETIAPINE FUMARATE 100 MG PO TABS
100.0000 mg | ORAL_TABLET | Freq: Every day | ORAL | 0 refills | Status: DC
Start: 2023-06-02 — End: 2023-10-11

## 2023-06-02 MED ORDER — METFORMIN HCL 1000 MG PO TABS: 1000.0000 mg | ORAL_TABLET | Freq: Two times a day (BID) | ORAL | 0 refills | Status: DC

## 2023-06-02 MED ORDER — MENTHOL 3 MG MT LOZG: 1.0000 | LOZENGE | OROMUCOSAL | Status: DC | PRN

## 2023-06-02 MED ORDER — NICOTINE 14 MG/24HR TD PT24: 14.0000 mg | MEDICATED_PATCH | Freq: Every day | TRANSDERMAL | 0 refills | Status: DC | PRN

## 2023-06-02 MED ORDER — ALBUTEROL SULFATE HFA 108 (90 BASE) MCG/ACT IN AERS: 2.0000 | INHALATION_SPRAY | Freq: Four times a day (QID) | RESPIRATORY_TRACT | 0 refills | Status: DC | PRN

## 2023-06-02 MED ORDER — ADULT MULTIVITAMIN W/MINERALS CH: 1.0000 | ORAL_TABLET | Freq: Every day | ORAL | 0 refills | Status: DC

## 2023-06-02 NOTE — Progress Notes (Signed)
 Discharge Note:  Patient denies SI/HI/AVH at this time. Discharge instructions, AVS, prescriptions, and transition recor gone over with patient. Patient agrees to comply with medication management, follow-up visit, and outpatient therapy. Patient belongings returned to patient. Patient questions and concerns addressed and answered. Patient ambulatory off unit. Patient discharged to [facility].

## 2023-06-02 NOTE — Progress Notes (Signed)
   06/02/23 0800  Psych Admission Type (Psych Patients Only)  Admission Status Voluntary  Psychosocial Assessment  Patient Complaints None  Eye Contact Fair  Facial Expression Anxious  Affect Appropriate to circumstance  Speech Logical/coherent  Interaction Assertive  Motor Activity Slow  Appearance/Hygiene Unremarkable  Behavior Characteristics Cooperative;Calm  Thought Process  Coherency WDL  Content WDL  Delusions None reported or observed  Perception WDL  Hallucination None reported or observed  Judgment Limited  Confusion None  Danger to Self  Current suicidal ideation? Denies  Agreement Not to Harm Self Yes  Description of Agreement verbal  Danger to Others  Danger to Others None reported or observed
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
## 2023-06-06 NOTE — Telephone Encounter (Signed)
 Grace Montgomery

## 2023-06-09 ENCOUNTER — Encounter (HOSPITAL_BASED_OUTPATIENT_CLINIC_OR_DEPARTMENT_OTHER): Payer: Self-pay | Admitting: *Deleted

## 2023-06-09 ENCOUNTER — Other Ambulatory Visit: Payer: Self-pay

## 2023-06-09 ENCOUNTER — Emergency Department (HOSPITAL_BASED_OUTPATIENT_CLINIC_OR_DEPARTMENT_OTHER)
Admission: EM | Admit: 2023-06-09 | Discharge: 2023-06-09 | Disposition: A | Discharge status: Home / Self Care | Payer: MEDICAID | Attending: Emergency Medicine | Admitting: Emergency Medicine

## 2023-06-09 DIAGNOSIS — B379 Candidiasis, unspecified: Secondary | ICD-10-CM | POA: Insufficient documentation

## 2023-06-09 DIAGNOSIS — N760 Acute vaginitis: Secondary | ICD-10-CM | POA: Diagnosis not present

## 2023-06-09 DIAGNOSIS — B9689 Other specified bacterial agents as the cause of diseases classified elsewhere: Secondary | ICD-10-CM | POA: Diagnosis not present

## 2023-06-09 DIAGNOSIS — E119 Type 2 diabetes mellitus without complications: Secondary | ICD-10-CM | POA: Diagnosis not present

## 2023-06-09 DIAGNOSIS — B3731 Acute candidiasis of vulva and vagina: Secondary | ICD-10-CM

## 2023-06-09 DIAGNOSIS — Z7984 Long term (current) use of oral hypoglycemic drugs: Secondary | ICD-10-CM | POA: Insufficient documentation

## 2023-06-09 DIAGNOSIS — L292 Pruritus vulvae: Secondary | ICD-10-CM | POA: Diagnosis present

## 2023-06-09 LAB — URINALYSIS, ROUTINE W REFLEX MICROSCOPIC
Bilirubin Urine: NEGATIVE
Glucose, UA: NEGATIVE mg/dL
Hgb urine dipstick: NEGATIVE
Ketones, ur: NEGATIVE mg/dL
Nitrite: NEGATIVE
Protein, ur: NEGATIVE mg/dL
Specific Gravity, Urine: 1.02 (ref 1.005–1.030)
pH: 7 (ref 5.0–8.0)

## 2023-06-09 LAB — WET PREP, GENITAL
Sperm: NONE SEEN
Trich, Wet Prep: NONE SEEN
WBC, Wet Prep HPF POC: <10 (ref <10)

## 2023-06-09 LAB — URINALYSIS, MICROSCOPIC (REFLEX)

## 2023-06-09 LAB — PREGNANCY, URINE: Preg Test, Ur: NEGATIVE

## 2023-06-09 MED ORDER — METRONIDAZOLE 500 MG PO TABS
500.0000 mg | ORAL_TABLET | Freq: Once | ORAL | Status: AC
  Administered 2023-06-09: 500 mg via ORAL
  Filled 2023-06-09: qty 1

## 2023-06-09 MED ORDER — DOXYCYCLINE HYCLATE 100 MG PO CAPS: 100.0000 mg | ORAL_CAPSULE | Freq: Two times a day (BID) | ORAL | 0 refills | Status: AC

## 2023-06-09 MED ORDER — DOXYCYCLINE HYCLATE 100 MG PO CAPS: 100.0000 mg | ORAL_CAPSULE | Freq: Two times a day (BID) | ORAL | 0 refills | Status: DC

## 2023-06-09 MED ORDER — FLUCONAZOLE 150 MG PO TABS
150.0000 mg | ORAL_TABLET | Freq: Once | ORAL | Status: AC
  Administered 2023-06-09: 150 mg via ORAL
  Filled 2023-06-09: qty 1

## 2023-06-09 MED ORDER — CEFTRIAXONE SODIUM 500 MG IJ SOLR
500.0000 mg | Freq: Once | INTRAMUSCULAR | Status: AC
  Administered 2023-06-09: 500 mg via INTRAMUSCULAR
  Filled 2023-06-09: qty 500

## 2023-06-09 MED ORDER — DOXYCYCLINE HYCLATE 100 MG PO TABS
100.0000 mg | ORAL_TABLET | Freq: Once | ORAL | Status: AC
  Administered 2023-06-09: 100 mg via ORAL
  Filled 2023-06-09: qty 1

## 2023-06-09 MED ORDER — METRONIDAZOLE 500 MG PO TABS: 500.0000 mg | ORAL_TABLET | Freq: Two times a day (BID) | ORAL | 0 refills | Status: DC

## 2023-06-09 MED ORDER — LIDOCAINE HCL (PF) 1 % IJ SOLN
INTRAMUSCULAR | Status: AC
  Administered 2023-06-09: 5 mL
  Filled 2023-06-09: qty 5

## 2023-06-09 NOTE — ED Triage Notes (Signed)
Pt has been having vaginal itching for a week.

## 2023-06-09 NOTE — Discharge Instructions (Addendum)
 You were seen in the emergency department for your vaginal itching.  You did test positive for both bacterial vaginosis and yeast.  I gave you a one-time antifungal here as well as a week worth of antibiotics to treat the BV.  We also tested you for gonorrhea and chlamydia and these test take 24 to 48 hours to come back but have given you prophylactic treatment so if either of these test come back positive you will not need to return and continue the antibiotics that you were prescribed.  You can follow-up with your primary doctor or GYN in the next few days to have your symptoms rechecked.  You should return to the emergency department if you develop abdominal pain, fevers, repetitive vomiting or any other new or concerning symptoms.

## 2023-06-09 NOTE — ED Provider Notes (Signed)
 [place] [facility] Provider Note   CSN: [id number] Arrival date & time: 06/09/23  1013     History  Chief Complaint  Patient presents with   Vaginal Itching    Grace Montgomery is a 33 y.o. female.  Patient is a 33 year old female with PMH DM, depression, substance use currently in rehab presenting to the ED with vaginal itching.  Patient states for about the last week she has had vaginal itching.  States that she has had BV in the past and it feels similar.  She denies any abnormal discharge or abdominal pain.  She denies any fevers or nausea or vomiting.  She states she has not been sexually active for about the last 2 months and is low suspicion for an STI.  She denies any rashes or lesions.  She denies any dysuria or hematuria.  The history is provided by the patient.  Vaginal Itching       Home Medications Prior to Admission medications   Medication Sig Start Date End Date Taking? Authorizing Provider  doxycycline (VIBRAMYCIN) 100 MG capsule Take 1 capsule (100 mg total) by mouth 2 (two) times daily for 7 days. 06/09/23 06/16/23 Yes Theresia Lo, Benetta Spar K, DO  metroNIDAZOLE (FLAGYL) 500 MG tablet Take 1 tablet (500 mg total) by mouth 2 (two) times daily. 06/09/23  Yes Theresia Lo, Benetta Spar K, DO  albuterol (VENTOLIN HFA) 108 (90 Base) MCG/ACT inhaler Inhale 2 puffs into the lungs every 6 (six) hours as needed for wheezing or shortness of breath. 06/02/23   Massengill, Harrold Donath, MD  menthol-cetylpyridinium (CEPACOL) 3 MG lozenge Take 1 lozenge (3 mg total) by mouth every 4 (four) hours as needed for sore throat. 06/02/23   Massengill, Harrold Donath, MD  metFORMIN (GLUCOPHAGE) 1000 MG tablet Take 1 tablet (1,000 mg total) by mouth 2 (two) times daily with a meal. 06/02/23 07/02/23  Massengill, Harrold Donath, MD  Multiple Vitamin (MULTIVITAMIN WITH MINERALS) TABS tablet Take 1 tablet by mouth daily. 06/02/23   Massengill, Harrold Donath, MD  nicotine (NICODERM CQ - DOSED IN MG/24  HOURS) 14 mg/24hr patch Place 1 patch (14 mg total) onto the skin daily as needed (for nicotine withdrawal). 06/02/23   Massengill, Harrold Donath, MD  nicotine polacrilex (NICORETTE) 2 MG gum Take 1 each (2 mg total) by mouth as needed for smoking cessation. 06/02/23   Massengill, Harrold Donath, MD  QUEtiapine (SEROQUEL) 100 MG tablet Take 1 tablet (100 mg total) by mouth at bedtime. 06/02/23 07/02/23  Massengill, Harrold Donath, MD  sertraline (ZOLOFT) 50 MG tablet Take 1 tablet (50 mg total) by mouth daily. 06/02/23 07/02/23  Massengill, Harrold Donath, MD      Allergies    Monistat [tioconazole], Chocolate, Ibuprofen, and Banana    Review of Systems   Review of Systems  Physical Exam Updated Vital Signs BP 125/88   Pulse (!) 113   Temp 98.1 F (36.7 C) (Oral)   LMP 05/21/2023 (Exact Date)   SpO2 100%  Physical Exam Vitals and nursing note reviewed.  Constitutional:      General: She is not in acute distress.    Appearance: Normal appearance.  HENT:     Head: Normocephalic and atraumatic.     Nose: Nose normal.     Mouth/Throat:     Mouth: Mucous membranes are moist.  Eyes:     Extraocular Movements: Extraocular movements intact.     Conjunctiva/sclera: Conjunctivae normal.  Cardiovascular:     Rate and Rhythm: Normal rate and regular rhythm.  Heart sounds: Normal heart sounds.  Pulmonary:     Effort: Pulmonary effort is normal.     Breath sounds: Normal breath sounds.  Abdominal:     General: Abdomen is flat.     Palpations: Abdomen is soft.     Tenderness: There is no abdominal tenderness.  Musculoskeletal:        General: Normal range of motion.     Cervical back: Normal range of motion.  Skin:    General: Skin is warm and dry.  Neurological:     General: No focal deficit present.     Mental Status: She is alert and oriented to person, place, and time.  Psychiatric:        Mood and Affect: Mood normal.        Behavior: Behavior normal.     ED Results / Procedures / Treatments    Labs (all labs ordered are listed, but only abnormal results are displayed) Labs Reviewed  WET PREP, GENITAL - Abnormal; Notable for the following components:      Result Value   Yeast Wet Prep HPF POC PRESENT (*)    Clue Cells Wet Prep HPF POC PRESENT (*)    All other components within normal limits  URINALYSIS, ROUTINE W REFLEX MICROSCOPIC - Abnormal; Notable for the following components:   Leukocytes,Ua TRACE (*)    All other components within normal limits  URINALYSIS, MICROSCOPIC (REFLEX) - Abnormal; Notable for the following components:   Bacteria, UA FEW (*)    All other components within normal limits  PREGNANCY, URINE  GC/CHLAMYDIA PROBE AMP ([place]) NOT AT [facility]    EKG None  Radiology No results found.  Procedures Procedures    Medications Ordered in ED Medications  metroNIDAZOLE (FLAGYL) tablet 500 mg (has no administration in time range)  fluconazole (DIFLUCAN) tablet 150 mg (has no administration in time range)  cefTRIAXone (ROCEPHIN) injection 500 mg (has no administration in time range)  doxycycline (VIBRA-TABS) tablet 100 mg (has no administration in time range)    ED Course/ Medical Decision Making/ A&P Clinical Course as of 06/09/23 1222  Thu Jun 09, 2023  1136 Wet prep positive for yeast and BV. Will be given antifungal and abx. [VK]    Clinical Course User Index [VK] Rexford Maus, DO                                 Medical Decision Making This patient presents to the ED with chief complaint(s) of vaginal itching with pertinent past medical history of diabetes, depression, substance use disorder which further complicates the presenting complaint. The complaint involves an extensive differential diagnosis and also carries with it a high risk of complications and morbidity.    The differential diagnosis includes yeast, BV, STI, UTI, no signs or symptoms of PID on exam  Additional history obtained: Additional history obtained from  N/A Records reviewed N/A  ED Course and Reassessment: On patient's arrival she is hemodynamically stable in no acute distress.  She will have urine and wet prep and GC chlamydia swab.  States that she had negative HIV and syphilis testing earlier this month and declines repeat testing today.  Independent labs interpretation:  The following labs were independently interpreted: wet prep positive for BV/yeast  Independent visualization of imaging: -N/A  Consultation: - Consulted or discussed management/test interpretation w/ external professional: N/A  Consideration for admission or further workup: Patient has no  emergent conditions requiring admission or further work-up at this time and is stable for discharge home with primary care follow-up  Social Determinants of health: N/A    Amount and/or Complexity of Data Reviewed Labs: ordered.  Risk Prescription drug management.          Final Clinical Impression(s) / ED Diagnoses Final diagnoses:  Bacterial vaginosis  Yeast vaginitis    Rx / DC Orders ED Discharge Orders          Ordered    metroNIDAZOLE (FLAGYL) 500 MG tablet  2 times daily        06/09/23 1221    doxycycline (VIBRAMYCIN) 100 MG capsule  2 times daily        06/09/23 1221              Elayne Snare K, DO 06/09/23 1222

## 2023-06-10 LAB — GC/CHLAMYDIA PROBE AMP (~~LOC~~) NOT AT ARMC
Chlamydia: NEGATIVE
Comment: NEGATIVE
Comment: NORMAL
Neisseria Gonorrhea: NEGATIVE

## 2023-06-13 ENCOUNTER — Encounter: Payer: Self-pay | Admitting: Physician Assistant

## 2023-06-13 ENCOUNTER — Ambulatory Visit: Payer: MEDICAID | Admitting: Physician Assistant

## 2023-06-13 VITALS — BP 104/69 | HR 107 | Ht 63.0 in | Wt 218.0 lb

## 2023-06-13 DIAGNOSIS — F332 Major depressive disorder, recurrent severe without psychotic features: Secondary | ICD-10-CM

## 2023-06-13 DIAGNOSIS — F1210 Cannabis abuse, uncomplicated: Secondary | ICD-10-CM

## 2023-06-13 DIAGNOSIS — R000 Tachycardia, unspecified: Secondary | ICD-10-CM | POA: Diagnosis not present

## 2023-06-13 DIAGNOSIS — Z7984 Long term (current) use of oral hypoglycemic drugs: Secondary | ICD-10-CM | POA: Diagnosis not present

## 2023-06-13 DIAGNOSIS — F411 Generalized anxiety disorder: Secondary | ICD-10-CM

## 2023-06-13 DIAGNOSIS — E559 Vitamin D deficiency, unspecified: Secondary | ICD-10-CM

## 2023-06-13 DIAGNOSIS — F5104 Psychophysiologic insomnia: Secondary | ICD-10-CM

## 2023-06-13 DIAGNOSIS — F1410 Cocaine abuse, uncomplicated: Secondary | ICD-10-CM

## 2023-06-13 DIAGNOSIS — B354 Tinea corporis: Secondary | ICD-10-CM | POA: Diagnosis not present

## 2023-06-13 DIAGNOSIS — E119 Type 2 diabetes mellitus without complications: Secondary | ICD-10-CM | POA: Diagnosis not present

## 2023-06-13 DIAGNOSIS — F1010 Alcohol abuse, uncomplicated: Secondary | ICD-10-CM

## 2023-06-13 MED ORDER — METFORMIN HCL 1000 MG PO TABS: 1000.0000 mg | ORAL_TABLET | Freq: Two times a day (BID) | ORAL | 0 refills | Status: DC

## 2023-06-13 MED ORDER — ADULT MULTIVITAMIN W/MINERALS CH: 1.0000 | ORAL_TABLET | Freq: Every day | ORAL | 1 refills | Status: DC

## 2023-06-13 MED ORDER — NYSTATIN 100000 UNIT/GM EX CREA: 1.0000 | TOPICAL_CREAM | Freq: Two times a day (BID) | CUTANEOUS | 0 refills | Status: DC

## 2023-06-13 MED ORDER — FLUCONAZOLE 150 MG PO TABS: 150.0000 mg | ORAL_TABLET | ORAL | 0 refills | Status: AC

## 2023-06-13 NOTE — Patient Instructions (Signed)
 You are going to take diflucan once a week for 6 weeks.  You are going to use nystatin cream twice daily on affected areas until resolved.  Monitor your blood sugar and pulse once daily for the next two weeks, keep a log and bring with you to your follow up.   You can stop doxycycline.  Roney Jaffe, PA-C Physician Assistant [facility] [email or web address]
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
## 2023-06-14 ENCOUNTER — Encounter: Payer: Self-pay | Admitting: Physician Assistant

## 2023-06-14 DIAGNOSIS — E559 Vitamin D deficiency, unspecified: Secondary | ICD-10-CM | POA: Insufficient documentation

## 2023-07-25 ENCOUNTER — Encounter: Payer: Self-pay | Admitting: Physician Assistant

## 2023-07-25 ENCOUNTER — Ambulatory Visit: Payer: MEDICAID | Admitting: Physician Assistant

## 2023-07-25 VITALS — BP 121/78 | HR 92 | Ht 64.0 in | Wt 233.0 lb

## 2023-07-25 DIAGNOSIS — B9689 Other specified bacterial agents as the cause of diseases classified elsewhere: Secondary | ICD-10-CM

## 2023-07-25 DIAGNOSIS — E119 Type 2 diabetes mellitus without complications: Secondary | ICD-10-CM | POA: Diagnosis not present

## 2023-07-25 DIAGNOSIS — Z7984 Long term (current) use of oral hypoglycemic drugs: Secondary | ICD-10-CM

## 2023-07-25 DIAGNOSIS — B350 Tinea barbae and tinea capitis: Secondary | ICD-10-CM

## 2023-07-25 DIAGNOSIS — M25531 Pain in right wrist: Secondary | ICD-10-CM

## 2023-07-25 DIAGNOSIS — G5601 Carpal tunnel syndrome, right upper limb: Secondary | ICD-10-CM

## 2023-07-25 DIAGNOSIS — N760 Acute vaginitis: Secondary | ICD-10-CM

## 2023-07-25 DIAGNOSIS — B354 Tinea corporis: Secondary | ICD-10-CM

## 2023-07-25 DIAGNOSIS — F1010 Alcohol abuse, uncomplicated: Secondary | ICD-10-CM

## 2023-07-25 DIAGNOSIS — K219 Gastro-esophageal reflux disease without esophagitis: Secondary | ICD-10-CM

## 2023-07-25 MED ORDER — METFORMIN HCL 1000 MG PO TABS: 1000.0000 mg | ORAL_TABLET | Freq: Two times a day (BID) | ORAL | 0 refills | Status: DC

## 2023-07-25 MED ORDER — FLUCONAZOLE 150 MG PO TABS: 300.0000 mg | ORAL_TABLET | ORAL | 0 refills | Status: DC

## 2023-07-25 MED ORDER — TRIAMCINOLONE ACETONIDE 0.1 % EX CREA: 1.0000 | TOPICAL_CREAM | Freq: Two times a day (BID) | CUTANEOUS | 0 refills | Status: DC

## 2023-07-25 MED ORDER — OMEPRAZOLE 20 MG PO CPDR: 20.0000 mg | DELAYED_RELEASE_CAPSULE | Freq: Every day | ORAL | 3 refills | Status: DC

## 2023-07-25 MED ORDER — METRONIDAZOLE 500 MG PO TABS: 500.0000 mg | ORAL_TABLET | Freq: Two times a day (BID) | ORAL | 0 refills | Status: DC

## 2023-07-25 MED ORDER — KETOCONAZOLE 2 % EX SHAM: 1.0000 | MEDICATED_SHAMPOO | Freq: Every day | CUTANEOUS | 0 refills | Status: DC | PRN
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
## 2023-07-26 ENCOUNTER — Encounter: Payer: Self-pay | Admitting: Physician Assistant

## 2023-10-03 ENCOUNTER — Emergency Department (HOSPITAL_COMMUNITY)
Admission: EM | Admit: 2023-10-03 | Discharge: 2023-10-04 | Disposition: A | Discharge status: Other Acute Inpatient Hospital | Payer: MEDICAID | Attending: Emergency Medicine | Admitting: Emergency Medicine

## 2023-10-03 DIAGNOSIS — E1165 Type 2 diabetes mellitus with hyperglycemia: Secondary | ICD-10-CM | POA: Diagnosis not present

## 2023-10-03 DIAGNOSIS — R739 Hyperglycemia, unspecified: Secondary | ICD-10-CM

## 2023-10-03 DIAGNOSIS — R45851 Suicidal ideations: Secondary | ICD-10-CM | POA: Diagnosis not present

## 2023-10-03 DIAGNOSIS — F1910 Other psychoactive substance abuse, uncomplicated: Secondary | ICD-10-CM | POA: Insufficient documentation

## 2023-10-03 DIAGNOSIS — F332 Major depressive disorder, recurrent severe without psychotic features: Secondary | ICD-10-CM | POA: Diagnosis present

## 2023-10-03 LAB — COMPREHENSIVE METABOLIC PANEL WITH GFR
ALT: 10 U/L (ref 0–44)
AST: 18 U/L (ref 15–41)
Albumin: 3.2 g/dL — ABNORMAL LOW (ref 3.5–5.0)
Alkaline Phosphatase: 68 U/L (ref 38–126)
Anion gap: 9 (ref 5–15)
BUN: 13 mg/dL (ref 6–20)
CO2: 22 mmol/L (ref 22–32)
Calcium: 8.7 mg/dL — ABNORMAL LOW (ref 8.9–10.3)
Chloride: 104 mmol/L (ref 98–111)
Creatinine, Ser: 1.04 mg/dL — ABNORMAL HIGH (ref 0.44–1.00)
GFR, Estimated: >60 mL/min (ref >60)
Glucose, Bld: 300 mg/dL — ABNORMAL HIGH (ref 70–99)
Potassium: 3.3 mmol/L — ABNORMAL LOW (ref 3.5–5.1)
Sodium: 135 mmol/L (ref 135–145)
Total Bilirubin: 0.6 mg/dL (ref 0.0–1.2)
Total Protein: 7.6 g/dL (ref 6.5–8.1)

## 2023-10-03 LAB — CBC
HCT: 34.3 % — ABNORMAL LOW (ref 36.0–46.0)
Hemoglobin: 10.8 g/dL — ABNORMAL LOW (ref 12.0–15.0)
MCH: 26.7 pg (ref 26.0–34.0)
MCHC: 31.5 g/dL (ref 30.0–36.0)
MCV: 84.7 fL (ref 80.0–100.0)
Platelets: 392 K/uL (ref 150–400)
RBC: 4.05 MIL/uL (ref 3.87–5.11)
RDW: 16.8 % — ABNORMAL HIGH (ref 11.5–15.5)
WBC: 7.3 K/uL (ref 4.0–10.5)
nRBC: 0 % (ref 0.0–0.2)

## 2023-10-03 LAB — ETHANOL: Alcohol, Ethyl (B): <15 mg/dL (ref <15)

## 2023-10-03 LAB — ACETAMINOPHEN LEVEL: Acetaminophen (Tylenol), Serum: <10 ug/mL — ABNORMAL LOW (ref 10–30)

## 2023-10-03 LAB — SALICYLATE LEVEL: Salicylate Lvl: <7 mg/dL — ABNORMAL LOW (ref 7.0–30.0)

## 2023-10-03 LAB — HCG, SERUM, QUALITATIVE: Preg, Serum: NEGATIVE

## 2023-10-03 MED ORDER — INSULIN ASPART 100 UNIT/ML IJ SOLN
7.0000 [IU] | INTRAMUSCULAR | Status: AC
  Administered 2023-10-03: 7 [IU] via SUBCUTANEOUS
  Filled 2023-10-03: qty 0.07

## 2023-10-03 MED ORDER — LORAZEPAM 1 MG PO TABS: 1.0000 mg | ORAL_TABLET | ORAL | Status: DC | PRN

## 2023-10-03 MED ORDER — THIAMINE MONONITRATE 100 MG PO TABS
100.0000 mg | ORAL_TABLET | Freq: Every day | ORAL | Status: DC
  Administered 2023-10-03 – 2023-10-04 (×2): 100 mg via ORAL
  Filled 2023-10-03 (×2): qty 1

## 2023-10-03 MED ORDER — ADULT MULTIVITAMIN W/MINERALS CH
1.0000 | ORAL_TABLET | Freq: Every day | ORAL | Status: DC
  Administered 2023-10-03 – 2023-10-04 (×2): 1 via ORAL
  Filled 2023-10-03 (×2): qty 1

## 2023-10-03 MED ORDER — THIAMINE HCL 100 MG/ML IJ SOLN: 100.0000 mg | Freq: Every day | INTRAMUSCULAR | Status: DC

## 2023-10-03 MED ORDER — INSULIN ASPART 100 UNIT/ML IJ SOLN
0.0000 [IU] | Freq: Every day | INTRAMUSCULAR | Status: DC
  Filled 2023-10-03: qty 0.05

## 2023-10-03 MED ORDER — FOLIC ACID 1 MG PO TABS
1.0000 mg | ORAL_TABLET | Freq: Every day | ORAL | Status: DC
  Administered 2023-10-03 – 2023-10-04 (×2): 1 mg via ORAL
  Filled 2023-10-03 (×2): qty 1

## 2023-10-03 MED ORDER — POTASSIUM CHLORIDE CRYS ER 20 MEQ PO TBCR
40.0000 meq | EXTENDED_RELEASE_TABLET | Freq: Once | ORAL | Status: AC
  Administered 2023-10-03: 40 meq via ORAL
  Filled 2023-10-03: qty 2

## 2023-10-03 MED ORDER — INSULIN ASPART 100 UNIT/ML IJ SOLN
0.0000 [IU] | Freq: Three times a day (TID) | INTRAMUSCULAR | Status: DC
  Administered 2023-10-04 (×2): 3 [IU] via SUBCUTANEOUS
  Filled 2023-10-03: qty 0.15

## 2023-10-03 NOTE — ED Notes (Signed)
 PT in room 35, meal given and TV turned on.

## 2023-10-03 NOTE — ED Triage Notes (Signed)
 Patient arrived stating she has been feeling suicidal and was attempting to jump off a bridge tonight.

## 2023-10-03 NOTE — ED Notes (Signed)
 Pt belongings were put in locker 35.

## 2023-10-03 NOTE — ED Notes (Signed)
 Pt has been dressed out into burgundy scrubs. Pt has one belonging bag. Pt has been wanded by security

## 2023-10-03 NOTE — ED Provider Notes (Signed)
 [facility] AT [facility] Provider Note   CSN: [id number] Arrival date & time: 10/03/23  2109     Patient presents with: Suicidal   Grace Montgomery is a 33 y.o. female.   33 year old female with history of substance use, bipolar, and depression who presents emergency department with SI.  Patient reports that over the past few weeks she has been feeling depressed.  Says that she also feels like she has been manic in the past week.  Says that she has has not slept in probably 6 days.  Has been using lots of alcohol and drugs.  Says that she drinks a lot and is unable to specify exactly how much.  Last drink was approximately 6 hours ago. Also has used ecstasy a few times in the past week with the last 1 being last night at 2 AM.  Today she does have a history of alcohol withdrawals.  Says that she was going to jump off a bridge but called her ACT team before doing it and they talked her into coming to the emergency department.       Prior to Admission medications   Medication Sig Start Date End Date Taking? Authorizing Provider  albuterol  (VENTOLIN  HFA) 108 (90 Base) MCG/ACT inhaler Inhale 2 puffs into the lungs every 6 (six) hours as needed for wheezing or shortness of breath. 06/02/23   Massengill, Rankin, MD  fluconazole  (DIFLUCAN ) 150 MG tablet Take 2 tablets (300 mg total) by mouth once a week. 07/25/23   Mayers, Cari S, PA-C  ketoconazole  (NIZORAL ) 2 % shampoo Apply 1 Application topically daily as needed for irritation. 07/25/23   Mayers, Cari S, PA-C  menthol -cetylpyridinium (CEPACOL) 3 MG lozenge Take 1 lozenge (3 mg total) by mouth every 4 (four) hours as needed for sore throat. 06/02/23   Massengill, Rankin, MD  metFORMIN  (GLUCOPHAGE ) 1000 MG tablet Take 1 tablet (1,000 mg total) by mouth 2 (two) times daily with a meal. 07/25/23 08/24/23  Mayers, Cari S, PA-C  metroNIDAZOLE  (FLAGYL ) 500 MG tablet Take 1 tablet (500 mg total) by mouth 2 (two) times  daily. 07/25/23   Mayers, Cari S, PA-C  Multiple Vitamin (MULTIVITAMIN WITH MINERALS) TABS tablet Take 1 tablet by mouth daily. 06/13/23   Mayers, Cari S, PA-C  nicotine  (NICODERM CQ  - DOSED IN MG/24 HOURS) 14 mg/24hr patch Place 1 patch (14 mg total) onto the skin daily as needed (for nicotine  withdrawal). 06/02/23   Massengill, Rankin, MD  nicotine  polacrilex (NICORETTE ) 2 MG gum Take 1 each (2 mg total) by mouth as needed for smoking cessation. 06/02/23   Massengill, Rankin, MD  nystatin  cream (MYCOSTATIN ) Apply 1 Application topically 2 (two) times daily. 06/13/23   Mayers, Cari S, PA-C  omeprazole  (PRILOSEC) 20 MG capsule Take 1 capsule (20 mg total) by mouth daily. 07/25/23   Mayers, Cari S, PA-C  QUEtiapine  (SEROQUEL ) 100 MG tablet Take 1 tablet (100 mg total) by mouth at bedtime. 06/02/23 07/02/23  Massengill, Rankin, MD  sertraline  (ZOLOFT ) 50 MG tablet Take 1 tablet (50 mg total) by mouth daily. 06/02/23 07/02/23  Massengill, Rankin, MD  triamcinolone  cream (KENALOG ) 0.1 % Apply 1 Application topically 2 (two) times daily. 07/25/23   Mayers, Cari S, PA-C    Allergies: Monistat [tioconazole], Chocolate, Ibuprofen , and Banana    Review of Systems  Updated Vital Signs BP (!) 134/95   Pulse (!) 115   Temp 98 F (36.7 C)   Resp 18   SpO2  97%   Physical Exam Vitals and nursing note reviewed.  Constitutional:      General: She is not in acute distress.    Appearance: She is well-developed.  HENT:     Head: Normocephalic and atraumatic.     Right Ear: External ear normal.     Left Ear: External ear normal.     Nose: Nose normal.  Eyes:     Extraocular Movements: Extraocular movements intact.     Conjunctiva/sclera: Conjunctivae normal.     Pupils: Pupils are equal, round, and reactive to light.     Comments: Pupils 5 mm and reactive bilaterally  Cardiovascular:     Rate and Rhythm: Normal rate and regular rhythm.     Heart sounds: No murmur heard. Pulmonary:     Effort: Pulmonary  effort is normal. No respiratory distress.     Breath sounds: Normal breath sounds.  Musculoskeletal:     Cervical back: Normal range of motion and neck supple.     Right lower leg: No edema.     Left lower leg: No edema.  Skin:    General: Skin is warm and dry.  Neurological:     Mental Status: She is alert and oriented to person, place, and time. Mental status is at baseline.     Comments: No tremors or tongue fasciculation  Psychiatric:        Mood and Affect: Mood normal.     (all labs ordered are listed, but only abnormal results are displayed) Labs Reviewed  COMPREHENSIVE METABOLIC PANEL WITH GFR - Abnormal; Notable for the following components:      Result Value   Potassium 3.3 (*)    Glucose, Bld 300 (*)    Creatinine, Ser 1.04 (*)    Calcium  8.7 (*)    Albumin 3.2 (*)    All other components within normal limits  CBC - Abnormal; Notable for the following components:   Hemoglobin 10.8 (*)    HCT 34.3 (*)    RDW 16.8 (*)    All other components within normal limits  ACETAMINOPHEN  LEVEL - Abnormal; Notable for the following components:   Acetaminophen  (Tylenol ), Serum <10 (*)    All other components within normal limits  SALICYLATE LEVEL - Abnormal; Notable for the following components:   Salicylate Lvl <7.0 (*)    All other components within normal limits  ETHANOL  HCG, SERUM, QUALITATIVE  RAPID URINE DRUG SCREEN, HOSP PERFORMED    EKG: EKG Interpretation Date/Time:  Monday October 03 2023 22:03:00 EDT Ventricular Rate:  90 PR Interval:  128 QRS Duration:  82 QT Interval:  356 QTC Calculation: 435 R Axis:   71  Text Interpretation: Normal sinus rhythm Normal ECG When compared with ECG of 28-May-2023 02:32, PREVIOUS ECG IS PRESENT Confirmed by Yolande Charleston [phone]) on 10/03/2023 10:31:58 PM  Radiology: No results found.   Procedures   Medications Ordered in the ED  LORazepam  (ATIVAN ) tablet 1-4 mg (has no administration in time range)  thiamine   (VITAMIN B1) tablet 100 mg (100 mg Oral Given 10/03/23 2203)    Or  thiamine  (VITAMIN B1) injection 100 mg ( Intravenous See Alternative 10/03/23 2203)  folic acid  (FOLVITE ) tablet 1 mg (1 mg Oral Given 10/03/23 2203)  multivitamin with minerals tablet 1 tablet (1 tablet Oral Given 10/03/23 2209)  potassium chloride  SA (KLOR-CON  M) CR tablet 40 mEq (has no administration in time range)  insulin  aspart (novoLOG ) injection 0-15 Units (has no administration in time range)  insulin  aspart (novoLOG ) injection 7 Units (has no administration in time range)  insulin  aspart (novoLOG ) injection 0-5 Units (has no administration in time range)                                    Medical Decision Making Amount and/or Complexity of Data Reviewed Labs: ordered.  Risk OTC drugs. Prescription drug management.   Grace Montgomery is a 33 y.o. female with comorbidities that complicate the patient evaluation including diabetes, substance abuse, bipolar disorder, depression, and alcohol use presents emergency department with suicidal ideations  Initial Ddx:  SI, manic episode, intoxication  MDM/Course:  Patient has emergency department with suicidal ideations.  Is seeking ongoing bridge.  Also reports she has not slept in 6 days and has been using multiple substances.  She feels that she is manic currently.  On exam does not appear to be in alcohol withdrawals and is in no acute distress.  Checked blood work that was drawn that was unremarkable aside from a potassium of 3.3 and a glucose of 300.  Does not appear to be in DKA at this point in time.  She was ordered for sliding scale insulin  and placed as a psych border for TTS evaluation.  IVC paperwork filled out.  This patient presents to the ED for concern of complaints listed in HPI, this involves an extensive number of treatment options, and is a complaint that carries with it a high risk of complications and morbidity. Disposition including potential need  for admission considered.   Dispo: boarder  Additional history obtained from spouse Records reviewed Outpatient Clinic Notes The following labs were independently interpreted: Chemistry and show Hyperglycemia I personally reviewed and interpreted the pt's EKG: see above for interpretation  I have reviewed the patients home medications and made adjustments as needed Consults: TTS  Portions of this note were generated with [employer] dictation software. Dictation errors may occur despite best attempts at proofreading.     Final diagnoses:  Suicidal ideation  Polysubstance abuse [facility])  Hyperglycemia    ED Discharge Orders     None          Yolande Lamar BROCKS, MD 10/03/23 2319

## 2023-10-04 ENCOUNTER — Other Ambulatory Visit: Payer: Self-pay

## 2023-10-04 ENCOUNTER — Encounter (HOSPITAL_COMMUNITY): Payer: Self-pay | Admitting: Psychiatry

## 2023-10-04 ENCOUNTER — Inpatient Hospital Stay (HOSPITAL_COMMUNITY)
Admission: AD | Admit: 2023-10-04 | Discharge: 2023-10-11 | DRG: 885 | Disposition: A | Discharge status: Home / Self Care | Payer: MEDICAID | Source: Intra-hospital | Attending: Psychiatry | Admitting: Psychiatry

## 2023-10-04 DIAGNOSIS — B372 Candidiasis of skin and nail: Secondary | ICD-10-CM | POA: Diagnosis present

## 2023-10-04 DIAGNOSIS — F17210 Nicotine dependence, cigarettes, uncomplicated: Secondary | ICD-10-CM | POA: Diagnosis present

## 2023-10-04 DIAGNOSIS — Z832 Family history of diseases of the blood and blood-forming organs and certain disorders involving the immune mechanism: Secondary | ICD-10-CM

## 2023-10-04 DIAGNOSIS — R45851 Suicidal ideations: Secondary | ICD-10-CM | POA: Diagnosis present

## 2023-10-04 DIAGNOSIS — Z9151 Personal history of suicidal behavior: Secondary | ICD-10-CM

## 2023-10-04 DIAGNOSIS — F329 Major depressive disorder, single episode, unspecified: Secondary | ICD-10-CM | POA: Diagnosis present

## 2023-10-04 DIAGNOSIS — N760 Acute vaginitis: Secondary | ICD-10-CM | POA: Diagnosis present

## 2023-10-04 DIAGNOSIS — Z5941 Food insecurity: Secondary | ICD-10-CM

## 2023-10-04 DIAGNOSIS — J45909 Unspecified asthma, uncomplicated: Secondary | ICD-10-CM | POA: Diagnosis present

## 2023-10-04 DIAGNOSIS — F4323 Adjustment disorder with mixed anxiety and depressed mood: Secondary | ICD-10-CM | POA: Diagnosis present

## 2023-10-04 DIAGNOSIS — Z555 Less than a high school diploma: Secondary | ICD-10-CM | POA: Diagnosis not present

## 2023-10-04 DIAGNOSIS — E119 Type 2 diabetes mellitus without complications: Principal | ICD-10-CM

## 2023-10-04 DIAGNOSIS — F332 Major depressive disorder, recurrent severe without psychotic features: Secondary | ICD-10-CM | POA: Diagnosis present

## 2023-10-04 DIAGNOSIS — K5900 Constipation, unspecified: Secondary | ICD-10-CM | POA: Diagnosis present

## 2023-10-04 DIAGNOSIS — F10139 Alcohol abuse with withdrawal, unspecified: Secondary | ICD-10-CM | POA: Diagnosis present

## 2023-10-04 DIAGNOSIS — Z794 Long term (current) use of insulin: Secondary | ICD-10-CM | POA: Diagnosis not present

## 2023-10-04 DIAGNOSIS — Z6791 Unspecified blood type, Rh negative: Secondary | ICD-10-CM

## 2023-10-04 DIAGNOSIS — Z813 Family history of other psychoactive substance abuse and dependence: Secondary | ICD-10-CM

## 2023-10-04 DIAGNOSIS — F1420 Cocaine dependence, uncomplicated: Secondary | ICD-10-CM | POA: Diagnosis not present

## 2023-10-04 DIAGNOSIS — Z6841 Body Mass Index (BMI) 40.0 and over, adult: Secondary | ICD-10-CM

## 2023-10-04 DIAGNOSIS — F1020 Alcohol dependence, uncomplicated: Secondary | ICD-10-CM | POA: Diagnosis not present

## 2023-10-04 DIAGNOSIS — Z9109 Other allergy status, other than to drugs and biological substances: Secondary | ICD-10-CM

## 2023-10-04 DIAGNOSIS — Z91199 Patient's noncompliance with other medical treatment and regimen due to unspecified reason: Secondary | ICD-10-CM

## 2023-10-04 DIAGNOSIS — Z7984 Long term (current) use of oral hypoglycemic drugs: Secondary | ICD-10-CM | POA: Diagnosis not present

## 2023-10-04 DIAGNOSIS — Z79899 Other long term (current) drug therapy: Secondary | ICD-10-CM | POA: Diagnosis not present

## 2023-10-04 DIAGNOSIS — Z818 Family history of other mental and behavioral disorders: Secondary | ICD-10-CM

## 2023-10-04 DIAGNOSIS — F411 Generalized anxiety disorder: Secondary | ICD-10-CM | POA: Diagnosis present

## 2023-10-04 DIAGNOSIS — Z720 Tobacco use: Secondary | ICD-10-CM | POA: Diagnosis not present

## 2023-10-04 DIAGNOSIS — E1165 Type 2 diabetes mellitus with hyperglycemia: Secondary | ICD-10-CM | POA: Diagnosis present

## 2023-10-04 DIAGNOSIS — F1220 Cannabis dependence, uncomplicated: Secondary | ICD-10-CM | POA: Diagnosis present

## 2023-10-04 DIAGNOSIS — Z811 Family history of alcohol abuse and dependence: Secondary | ICD-10-CM

## 2023-10-04 DIAGNOSIS — Z833 Family history of diabetes mellitus: Secondary | ICD-10-CM | POA: Diagnosis not present

## 2023-10-04 DIAGNOSIS — Z5900 Homelessness unspecified: Secondary | ICD-10-CM

## 2023-10-04 DIAGNOSIS — Z823 Family history of stroke: Secondary | ICD-10-CM

## 2023-10-04 DIAGNOSIS — L309 Dermatitis, unspecified: Secondary | ICD-10-CM | POA: Diagnosis present

## 2023-10-04 DIAGNOSIS — F4310 Post-traumatic stress disorder, unspecified: Secondary | ICD-10-CM | POA: Diagnosis present

## 2023-10-04 DIAGNOSIS — Z886 Allergy status to analgesic agent status: Secondary | ICD-10-CM

## 2023-10-04 DIAGNOSIS — Z5986 Financial insecurity: Secondary | ICD-10-CM

## 2023-10-04 DIAGNOSIS — Z9152 Personal history of nonsuicidal self-harm: Secondary | ICD-10-CM

## 2023-10-04 DIAGNOSIS — F1520 Other stimulant dependence, uncomplicated: Secondary | ICD-10-CM | POA: Diagnosis present

## 2023-10-04 DIAGNOSIS — Z62810 Personal history of physical and sexual abuse in childhood: Secondary | ICD-10-CM

## 2023-10-04 DIAGNOSIS — Z5982 Transportation insecurity: Secondary | ICD-10-CM

## 2023-10-04 DIAGNOSIS — F1410 Cocaine abuse, uncomplicated: Secondary | ICD-10-CM | POA: Diagnosis present

## 2023-10-04 DIAGNOSIS — F1210 Cannabis abuse, uncomplicated: Secondary | ICD-10-CM | POA: Diagnosis present

## 2023-10-04 DIAGNOSIS — Z888 Allergy status to other drugs, medicaments and biological substances status: Secondary | ICD-10-CM

## 2023-10-04 DIAGNOSIS — F1090 Alcohol use, unspecified, uncomplicated: Secondary | ICD-10-CM | POA: Diagnosis present

## 2023-10-04 DIAGNOSIS — B354 Tinea corporis: Secondary | ICD-10-CM

## 2023-10-04 DIAGNOSIS — Z825 Family history of asthma and other chronic lower respiratory diseases: Secondary | ICD-10-CM

## 2023-10-04 DIAGNOSIS — R45850 Homicidal ideations: Secondary | ICD-10-CM | POA: Diagnosis present

## 2023-10-04 LAB — RAPID URINE DRUG SCREEN, HOSP PERFORMED
Amphetamines: NOT DETECTED
Barbiturates: NOT DETECTED
Benzodiazepines: NOT DETECTED
Cocaine: NOT DETECTED
Opiates: NOT DETECTED
Tetrahydrocannabinol: POSITIVE — AB

## 2023-10-04 LAB — GLUCOSE, CAPILLARY
Glucose-Capillary: 121 mg/dL — ABNORMAL HIGH (ref 70–99)
Glucose-Capillary: 265 mg/dL — ABNORMAL HIGH (ref 70–99)

## 2023-10-04 LAB — CBG MONITORING, ED
Glucose-Capillary: 155 mg/dL — ABNORMAL HIGH (ref 70–99)
Glucose-Capillary: 185 mg/dL — ABNORMAL HIGH (ref 70–99)

## 2023-10-04 MED ORDER — DIPHENHYDRAMINE HCL 50 MG/ML IJ SOLN: 50.0000 mg | Freq: Three times a day (TID) | INTRAMUSCULAR | Status: DC | PRN

## 2023-10-04 MED ORDER — HALOPERIDOL LACTATE 5 MG/ML IJ SOLN: 10.0000 mg | Freq: Three times a day (TID) | INTRAMUSCULAR | Status: DC | PRN

## 2023-10-04 MED ORDER — ACETAMINOPHEN 325 MG PO TABS
650.0000 mg | ORAL_TABLET | Freq: Four times a day (QID) | ORAL | Status: DC | PRN
  Administered 2023-10-04 – 2023-10-10 (×2): 650 mg via ORAL
  Filled 2023-10-04 (×2): qty 2

## 2023-10-04 MED ORDER — FOLIC ACID 1 MG PO TABS
1.0000 mg | ORAL_TABLET | Freq: Every day | ORAL | Status: DC
  Administered 2023-10-05 – 2023-10-11 (×7): 1 mg via ORAL
  Filled 2023-10-04 (×7): qty 1

## 2023-10-04 MED ORDER — LORAZEPAM 2 MG/ML IJ SOLN: 2.0000 mg | Freq: Three times a day (TID) | INTRAMUSCULAR | Status: DC | PRN

## 2023-10-04 MED ORDER — LORAZEPAM 1 MG PO TABS: 1.0000 mg | ORAL_TABLET | ORAL | Status: AC | PRN

## 2023-10-04 MED ORDER — INSULIN ASPART 100 UNIT/ML IJ SOLN
0.0000 [IU] | Freq: Three times a day (TID) | INTRAMUSCULAR | Status: DC
  Administered 2023-10-04: 2 [IU] via SUBCUTANEOUS
  Administered 2023-10-05: 5 [IU] via SUBCUTANEOUS
  Administered 2023-10-05 – 2023-10-06 (×5): 3 [IU] via SUBCUTANEOUS
  Administered 2023-10-07: 5 [IU] via SUBCUTANEOUS
  Administered 2023-10-07 – 2023-10-08 (×2): 2 [IU] via SUBCUTANEOUS
  Administered 2023-10-08: 5 [IU] via SUBCUTANEOUS
  Administered 2023-10-08: 3 [IU] via SUBCUTANEOUS
  Administered 2023-10-09: 8 [IU] via SUBCUTANEOUS
  Administered 2023-10-09 – 2023-10-10 (×3): 3 [IU] via SUBCUTANEOUS
  Administered 2023-10-10 (×2): 5 [IU] via SUBCUTANEOUS
  Administered 2023-10-11: 3 [IU] via SUBCUTANEOUS

## 2023-10-04 MED ORDER — ADULT MULTIVITAMIN W/MINERALS CH
1.0000 | ORAL_TABLET | Freq: Every day | ORAL | Status: DC
  Administered 2023-10-05 – 2023-10-11 (×7): 1 via ORAL
  Filled 2023-10-04 (×7): qty 1

## 2023-10-04 MED ORDER — DIPHENHYDRAMINE HCL 25 MG PO CAPS: 50.0000 mg | ORAL_CAPSULE | Freq: Three times a day (TID) | ORAL | Status: DC | PRN

## 2023-10-04 MED ORDER — HALOPERIDOL LACTATE 5 MG/ML IJ SOLN: 5.0000 mg | Freq: Three times a day (TID) | INTRAMUSCULAR | Status: DC | PRN

## 2023-10-04 MED ORDER — HALOPERIDOL 5 MG PO TABS: 5.0000 mg | ORAL_TABLET | Freq: Three times a day (TID) | ORAL | Status: DC | PRN

## 2023-10-04 MED ORDER — VITAMIN B-1 100 MG PO TABS
100.0000 mg | ORAL_TABLET | Freq: Every day | ORAL | Status: DC
  Administered 2023-10-05 – 2023-10-11 (×7): 100 mg via ORAL
  Filled 2023-10-04 (×7): qty 1

## 2023-10-04 MED ORDER — THIAMINE HCL 100 MG/ML IJ SOLN: 100.0000 mg | Freq: Every day | INTRAMUSCULAR | Status: DC

## 2023-10-04 MED ORDER — INSULIN ASPART 100 UNIT/ML IJ SOLN
0.0000 [IU] | Freq: Every day | INTRAMUSCULAR | Status: DC
  Administered 2023-10-04 – 2023-10-05 (×2): 3 [IU] via SUBCUTANEOUS
  Administered 2023-10-06 – 2023-10-07 (×2): 2 [IU] via SUBCUTANEOUS
  Administered 2023-10-08: 3 [IU] via SUBCUTANEOUS
  Administered 2023-10-10: 2 [IU] via SUBCUTANEOUS

## 2023-10-04 MED ORDER — TRAZODONE HCL 50 MG PO TABS
50.0000 mg | ORAL_TABLET | Freq: Every evening | ORAL | Status: DC | PRN
  Administered 2023-10-04: 50 mg via ORAL
  Filled 2023-10-04: qty 1

## 2023-10-04 MED ORDER — MAGNESIUM HYDROXIDE 400 MG/5ML PO SUSP
30.0000 mL | Freq: Every day | ORAL | Status: DC | PRN
  Administered 2023-10-06: 30 mL via ORAL
  Filled 2023-10-04: qty 30

## 2023-10-04 MED ORDER — ALUM & MAG HYDROXIDE-SIMETH 200-200-20 MG/5ML PO SUSP
30.0000 mL | ORAL | Status: DC | PRN
  Administered 2023-10-04 – 2023-10-06 (×2): 30 mL via ORAL
  Filled 2023-10-04 (×2): qty 30

## 2023-10-04 NOTE — B[facility] Group Notes (Signed)
 The focus of this group is to help patients review their daily goal of treatment and discuss progress on daily workbooks. Pt was appropriate and attentive during tonight's group discussion. Pt stated that she was able to shower today and explained how that was good self care. Overall day was good.

## 2023-10-04 NOTE — Plan of Care (Signed)
   Problem: Activity: Goal: Interest or engagement in activities will improve Outcome: Progressing   Problem: Coping: Goal: Ability to verbalize frustrations and anger appropriately will improve Outcome: Progressing

## 2023-10-04 NOTE — Tx Team (Signed)
 Initial Treatment Plan 10/04/2023 5:59 PM [name] FMW:[id number]    PATIENT STRESSORS: Financial difficulties   Medication change or noncompliance   Occupational concerns   Substance abuse     PATIENT STRENGTHS: Motivation for treatment/growth    PATIENT IDENTIFIED PROBLEMS: Suicidal Ideation   Depression   Polysubstance use disorder                 DISCHARGE CRITERIA:  Verbal commitment to aftercare and medication compliance Withdrawal symptoms are absent or subacute and managed without 24-hour nursing intervention  PRELIMINARY DISCHARGE PLAN: Outpatient therapy  PATIENT/FAMILY INVOLVEMENT: This treatment plan has been presented to and reviewed with the patient, Grace Montgomery, has been given the opportunity to ask questions and make suggestions.  Joaquin MALVA Doing, RN 10/04/2023, 5:59 PM

## 2023-10-04 NOTE — Progress Notes (Signed)
 Admission note: Patient is a 33 year old AA female  admitted under IVC status  from [facility] for suicidal ideation with a plan to jump off the bridge, patient is also misusing multiple substances as well as alcohol use. Patient arrived to the unit via safe transport at 1630, alert, awake, and oriented X's 4. Patient presented with flat affect and irritable mood on arrival to the unit. Patient endorses passive SI, however, contracts for safety during admission interview with her. Patient denies HI/AVH, she states to using drugs, drinking alcohol, and smoking a pack of cigarette per day. Patient was offered nicotine  replacement order, she refused, and according to her I will go cold [place].' Patient states she wants to work on her medication adjustment, and her anger issues. No acute distress noted at this time.  Pt  has orientation to unit, room and routine. Information packet given to patient and safety information discussed with her.  Admission INP armband ID verified with patient, and in place, fall risk assessment completed with Patient and she  verbalized understanding of risks associated with falls. No contraband found during skin assessment, Skin, clean-dry- intact without evidence of bruising, or skin tears and tracks marks.  Q 15 minutes safety observation in place. Staff will continue to provide support to patient.

## 2023-10-04 NOTE — Progress Notes (Signed)
 Pt has been accepted to [facility] on 10/04/2023 Bed assignment: 302-02  Pt meets inpatient criteria per: Richerd Ivans NP  Attending Physician will be: Dr. Zouez   Report can be called to: Adult unit: [phone]  Pt can arrive after IVC / Consents   Care Team Notified: [facility] Cherylynn Ernst RN, Cathaleen Jacobson NP, [place] paramedic    [place] Zaelynn Fuchs LCSW-A   10/04/2023 9:55 AM

## 2023-10-04 NOTE — ED Notes (Addendum)
 Report called to [facility] RN I[place]i and safe transport called as well.

## 2023-10-04 NOTE — ED Notes (Signed)
Patient calm and cooperative this shift.  Interaction with staff was appropriate.

## 2023-10-04 NOTE — ED Notes (Signed)
 TTS in progress
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
## 2023-10-04 NOTE — Plan of Care (Signed)
  Problem: Activity: Goal: Interest or engagement in activities will improve 10/04/2023 1758 by Cresenciano Joaquin KIDD, RN Outcome: Progressing 10/04/2023 1758 by Cresenciano Joaquin KIDD, RN Outcome: Progressing   Problem: Education: Goal: Mental status will improve 10/04/2023 1758 by Cresenciano Joaquin KIDD, RN Outcome: Progressing 10/04/2023 1758 by Cresenciano Joaquin KIDD, RN Outcome: Progressing   Problem: Safety: Goal: Periods of time without injury will increase 10/04/2023 1758 by Cresenciano Joaquin KIDD, RN Outcome: Progressing 10/04/2023 1758 by Cresenciano Joaquin KIDD, RN Outcome: Progressing

## 2023-10-04 NOTE — Progress Notes (Signed)
   10/04/23 2200  Psych Admission Type (Psych Patients Only)  Admission Status Involuntary  Psychosocial Assessment  Patient Complaints Anxiety  Eye Contact Fair  Facial Expression Anxious;Animated  Affect Anxious  Speech Logical/coherent  Interaction Assertive  Motor Activity Other (Comment) (appropriate)  Appearance/Hygiene Disheveled  Behavior Characteristics Anxious;Fidgety  Mood Anxious;Pleasant  Thought Process  Coherency WDL  Content Preoccupation  Delusions None reported or observed  Perception WDL  Hallucination None reported or observed  Judgment Poor  Confusion None  Danger to Self  Current suicidal ideation? Passive  Agreement Not to Harm Self Yes  Description of Agreement verbal  Danger to Others  Danger to Others None reported or observed

## 2023-10-05 ENCOUNTER — Encounter (HOSPITAL_COMMUNITY): Payer: Self-pay

## 2023-10-05 DIAGNOSIS — F332 Major depressive disorder, recurrent severe without psychotic features: Secondary | ICD-10-CM

## 2023-10-05 LAB — GLUCOSE, CAPILLARY
Glucose-Capillary: 184 mg/dL — ABNORMAL HIGH (ref 70–99)
Glucose-Capillary: 156 mg/dL — ABNORMAL HIGH (ref 70–99)
Glucose-Capillary: 229 mg/dL — ABNORMAL HIGH (ref 70–99)
Glucose-Capillary: 268 mg/dL — ABNORMAL HIGH (ref 70–99)

## 2023-10-05 MED ORDER — METFORMIN HCL 500 MG PO TABS
1000.0000 mg | ORAL_TABLET | Freq: Two times a day (BID) | ORAL | Status: DC
  Administered 2023-10-05 – 2023-10-11 (×12): 1000 mg via ORAL
  Filled 2023-10-05 (×12): qty 2

## 2023-10-05 MED ORDER — ARIPIPRAZOLE 2 MG PO TABS
2.0000 mg | ORAL_TABLET | Freq: Every day | ORAL | Status: DC
  Administered 2023-10-05 – 2023-10-06 (×2): 2 mg via ORAL
  Filled 2023-10-05 (×2): qty 1

## 2023-10-05 MED ORDER — METFORMIN HCL 500 MG PO TABS: 500.0000 mg | ORAL_TABLET | Freq: Two times a day (BID) | ORAL | Status: DC

## 2023-10-05 MED ORDER — TRAZODONE HCL 150 MG PO TABS
150.0000 mg | ORAL_TABLET | Freq: Every evening | ORAL | Status: DC | PRN
  Administered 2023-10-05 – 2023-10-10 (×6): 150 mg via ORAL
  Filled 2023-10-05 (×6): qty 1

## 2023-10-05 NOTE — Plan of Care (Signed)
   Problem: Education: Goal: Emotional status will improve Outcome: Progressing Goal: Mental status will improve Outcome: Progressing   Problem: Activity: Goal: Sleeping patterns will improve Outcome: Progressing

## 2023-10-05 NOTE — Progress Notes (Signed)
(  Sleep Hours) - 6.25 (Any PRNs that were needed, meds refused, or side effects to meds)- trazodone  prn, no meds refused, no side effects to meds  (Any disturbances and when (visitation, over night)- n/a (Concerns raised by the patient)- n/a  (SI/HI/AVH)- reports passive si/denies hi/denies AVH

## 2023-10-05 NOTE — Plan of Care (Signed)
   Problem: Activity: Goal: Interest or engagement in activities will improve Outcome: Progressing   Problem: Coping: Goal: Ability to verbalize frustrations and anger appropriately will improve Outcome: Progressing   Problem: Safety: Goal: Periods of time without injury will increase Outcome: Progressing

## 2023-10-05 NOTE — Group Note (Signed)
 Date:  10/05/2023 Time:  8:57 AM  Group Topic/Focus:  Goals Group:   The focus of this group is to help patients establish daily goals to achieve during treatment and discuss how the patient can incorporate goal setting into their daily lives to aide in recovery.    Participation Level:  Did Not Attend  Participation Quality:    Affect:    Cognitive:    Insight:   Engagement in Group:    Modes of Intervention:    Additional Comments:  Pt were advised of group time pt refused.  Cassius LOISE Dawn 10/05/2023, 8:57 AM
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
## 2023-10-05 NOTE — Progress Notes (Signed)
 Adult Psychoeducational Group Note  Date:  10/05/2023 Time:  10:01 PM  Group Topic/Focus:  Wrap-Up Group:   The focus of this group is to help patients review their daily goal of treatment and discuss progress on daily workbooks.  Participation Level:  Active  Participation Quality:  Appropriate  Affect:  Appropriate  Cognitive:  Appropriate  Insight: Appropriate  Engagement in Group:  Engaged  Modes of Intervention:  Discussion  Additional Comments:  she has nothing positive to say  Lang Drilling Long 10/05/2023, 10:01 PM

## 2023-10-05 NOTE — Group Note (Signed)
 Recreation Therapy Group Note   Group Topic:Other  Group Date: 10/05/2023 Start Time: 1410 End Time: 1500 Facilitators: Yobani Schertzer-McCall, LRT,CTRS Location: [address] Dayroom   Activity Description/Intervention: Therapeutic Drumming. Patients with peers and staff were given the opportunity to engage in a leader facilitated [facility] Group Empowerment Drumming Circle with staff from the [employer], in partnership with [employer]. Teaching laboratory technician and trained [employer], Norleen Mon leading with LRT observing and documenting intervention and pt response. This evidenced-based practice targets 7 areas of health and wellbeing in the human experience including: stress-reduction, exercise, self-expression, camaraderie/support, nurturing, spirituality, and music-making (leisure).   Goal Area(s) Addresses:  Patient will engage in pro-social way in music group.  Patient will follow directions of drum leader on the first prompt. Patient will demonstrate no behavioral issues during group.  Patient will identify if a reduction in stress level occurs as a result of participation in therapeutic drum circle.    Education: Leisure exposure, Pharmacologist, Musical expression, Discharge Planning  Affect/Mood: N/A   Participation Level: Did not attend    Clinical Observations/Individualized Feedback:      Plan: Continue to engage patient in RT group sessions 2-3x/week.   Macon Lesesne-McCall, LRT,CTRS 10/05/2023 3:21 PM
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
## 2023-10-05 NOTE — Progress Notes (Signed)
   10/05/23 0800  Psych Admission Type (Psych Patients Only)  Admission Status Involuntary  Psychosocial Assessment  Patient Complaints Anxiety  Eye Contact Fair  Facial Expression Animated;Anxious  Affect Anxious  Speech Logical/coherent  Interaction Assertive  Motor Activity Other (Comment) (WDL)  Appearance/Hygiene Disheveled  Behavior Characteristics Anxious  Mood Anxious  Thought Process  Coherency WDL  Content Preoccupation  Delusions None reported or observed  Perception WDL  Hallucination None reported or observed  Judgment Poor  Confusion None  Danger to Self  Current suicidal ideation? Passive  Agreement Not to Harm Self Yes  Description of Agreement Verbal

## 2023-10-05 NOTE — BHH Group Notes (Signed)
 Spiritual care group facilitated by Chaplain Rockie Sofia, [facility]  Group focused on topic of strength. Group members reflected on what thoughts and feelings emerge when they hear this topic. They then engaged in facilitated dialog around how strength is present in their lives. This dialog focused on representing what strength had been to them in their lives (images and patterns given) and what they saw as helpful in their life now (what they needed / wanted).  Activity drew on narrative framework.  Patient Progress: Grace Montgomery attended group.  Though verbal participation was minimal, she demonstrated engagement in the group conversation.

## 2023-10-05 NOTE — BH IP Treatment Plan (Signed)
 Interdisciplinary Treatment and Diagnostic Plan Update  10/05/2023 Time of Session: 1026am Grace Montgomery MRN: [id number]  Principal Diagnosis: MDD (major depressive disorder), recurrent severe, without psychosis (HCC)  Secondary Diagnoses: Principal Problem:   MDD (major depressive disorder), recurrent severe, without psychosis (HCC) Active Problems:   Marijuana abuse   Cocaine use disorder (HCC)   Alcohol use disorder   Current Medications:  Current Facility-Administered Medications  Medication Dose Route Frequency Provider Last Rate Last Admin   acetaminophen  (TYLENOL ) tablet 650 mg  650 mg Oral Q6H PRN Motley-Mangrum, Jadeka A, PMHNP   650 mg at 10/04/23 1953   alum & mag hydroxide-simeth (MAALOX/MYLANTA) 200-200-20 MG/5ML suspension 30 mL  30 mL Oral Q4H PRN Motley-Mangrum, Jadeka A, PMHNP   30 mL at 10/04/23 2105   ARIPiprazole  (ABILIFY ) tablet 2 mg  2 mg Oral Daily Elodie Palma, MD   2 mg at 10/05/23 1049   haloperidol  (HALDOL ) tablet 5 mg  5 mg Oral TID PRN Motley-Mangrum, Jadeka A, PMHNP       And   diphenhydrAMINE  (BENADRYL ) capsule 50 mg  50 mg Oral TID PRN Motley-Mangrum, Jadeka A, PMHNP       haloperidol  lactate (HALDOL ) injection 5 mg  5 mg Intramuscular TID PRN Motley-Mangrum, Jadeka A, PMHNP       And   diphenhydrAMINE  (BENADRYL ) injection 50 mg  50 mg Intramuscular TID PRN Motley-Mangrum, Jadeka A, PMHNP       And   LORazepam  (ATIVAN ) injection 2 mg  2 mg Intramuscular TID PRN Motley-Mangrum, Jadeka A, PMHNP       haloperidol  lactate (HALDOL ) injection 10 mg  10 mg Intramuscular TID PRN Motley-Mangrum, Jadeka A, PMHNP       And   diphenhydrAMINE  (BENADRYL ) injection 50 mg  50 mg Intramuscular TID PRN Motley-Mangrum, Jadeka A, PMHNP       And   LORazepam  (ATIVAN ) injection 2 mg  2 mg Intramuscular TID PRN Motley-Mangrum, Jadeka A, PMHNP       folic acid  (FOLVITE ) tablet 1 mg  1 mg Oral Daily Motley-Mangrum, Jadeka A, PMHNP   1 mg at 10/05/23 0854   insulin   aspart (novoLOG ) injection 0-15 Units  0-15 Units Subcutaneous TID WC Motley-Mangrum, Jadeka A, PMHNP   3 Units at 10/05/23 1250   insulin  aspart (novoLOG ) injection 0-5 Units  0-5 Units Subcutaneous QHS Motley-Mangrum, Jadeka A, PMHNP   3 Units at 10/04/23 2107   LORazepam  (ATIVAN ) tablet 1-4 mg  1-4 mg Oral Q1H PRN Motley-Mangrum, Jadeka A, PMHNP       magnesium  hydroxide (MILK OF MAGNESIA) suspension 30 mL  30 mL Oral Daily PRN Motley-Mangrum, Jadeka A, PMHNP       multivitamin with minerals tablet 1 tablet  1 tablet Oral Daily Motley-Mangrum, Jadeka A, PMHNP   1 tablet at 10/05/23 0854   thiamine  (Vitamin B-1) tablet 100 mg  100 mg Oral Daily Motley-Mangrum, Jadeka A, PMHNP   100 mg at 10/05/23 9145   Or   thiamine  (VITAMIN B1) injection 100 mg  100 mg Intravenous Daily Motley-Mangrum, Jadeka A, PMHNP       traZODone  (DESYREL ) tablet 150 mg  150 mg Oral QHS PRN Elodie Palma, MD       PTA Medications: Medications Prior to Admission  Medication Sig Dispense Refill Last Dose/Taking   albuterol  (VENTOLIN  HFA) 108 (90 Base) MCG/ACT inhaler Inhale 2 puffs into the lungs every 6 (six) hours as needed for wheezing or shortness of breath. 1 each 0  fluconazole  (DIFLUCAN ) 150 MG tablet Take 2 tablets (300 mg total) by mouth once a week. 4 tablet 0    ketoconazole  (NIZORAL ) 2 % shampoo Apply 1 Application topically daily as needed for irritation. 120 mL 0    menthol -cetylpyridinium (CEPACOL) 3 MG lozenge Take 1 lozenge (3 mg total) by mouth every 4 (four) hours as needed for sore throat.      metFORMIN  (GLUCOPHAGE ) 1000 MG tablet Take 1 tablet (1,000 mg total) by mouth 2 (two) times daily with a meal. 60 tablet 0    nystatin  cream (MYCOSTATIN ) Apply 1 Application topically 2 (two) times daily. (Patient taking differently: Apply 1 Application topically 2 (two) times daily as needed for dry skin.) 60 g 0    omeprazole  (PRILOSEC) 20 MG capsule Take 1 capsule (20 mg total) by mouth daily. 30 capsule 3     QUEtiapine  (SEROQUEL ) 100 MG tablet Take 1 tablet (100 mg total) by mouth at bedtime. (Patient not taking: Reported on 10/04/2023) 30 tablet 0    sertraline  (ZOLOFT ) 50 MG tablet Take 1 tablet (50 mg total) by mouth daily. 30 tablet 0    trazodone  (DESYREL ) 300 MG tablet Take 150 mg by mouth at bedtime as needed for sleep.      triamcinolone  cream (KENALOG ) 0.1 % Apply 1 Application topically 2 (two) times daily. (Patient taking differently: Apply 1 Application topically 2 (two) times daily as needed (Irritation).) 80 g 0     Patient Stressors: Financial difficulties   Medication change or noncompliance   Occupational concerns   Substance abuse    Patient Strengths: Motivation for treatment/growth   Treatment Modalities: Medication Management, Group therapy, Case management,  1 to 1 session with clinician, Psychoeducation, Recreational therapy.   Physician Treatment Plan for Primary Diagnosis: MDD (major depressive disorder), recurrent severe, without psychosis (HCC) Long Term Goal(s):     Short Term Goals: Ability to identify changes in lifestyle to reduce recurrence of condition will improve Ability to verbalize feelings will improve Ability to disclose and discuss suicidal ideas  Medication Management: Evaluate patient's response, side effects, and tolerance of medication regimen.  Therapeutic Interventions: 1 to 1 sessions, Unit Group sessions and Medication administration.  Evaluation of Outcomes: Not Progressing  Physician Treatment Plan for Secondary Diagnosis: Principal Problem:   MDD (major depressive disorder), recurrent severe, without psychosis (HCC) Active Problems:   Marijuana abuse   Cocaine use disorder (HCC)   Alcohol use disorder  Long Term Goal(s):     Short Term Goals: Ability to identify changes in lifestyle to reduce recurrence of condition will improve Ability to verbalize feelings will improve Ability to disclose and discuss suicidal ideas      Medication Management: Evaluate patient's response, side effects, and tolerance of medication regimen.  Therapeutic Interventions: 1 to 1 sessions, Unit Group sessions and Medication administration.  Evaluation of Outcomes: Not Progressing   RN Treatment Plan for Primary Diagnosis: MDD (major depressive disorder), recurrent severe, without psychosis (HCC) Long Term Goal(s): Knowledge of disease and therapeutic regimen to maintain health will improve  Short Term Goals: Ability to remain free from injury will improve, Ability to verbalize frustration and anger appropriately will improve, Ability to demonstrate self-control, Ability to participate in decision making will improve, Ability to verbalize feelings will improve, Ability to disclose and discuss suicidal ideas, Ability to identify and develop effective coping behaviors will improve, and Compliance with prescribed medications will improve  Medication Management: RN will administer medications as ordered by provider, will assess and  evaluate patient's response and provide education to patient for prescribed medication. RN will report any adverse and/or side effects to prescribing provider.  Therapeutic Interventions: 1 on 1 counseling sessions, Psychoeducation, Medication administration, Evaluate responses to treatment, Monitor vital signs and CBGs as ordered, Perform/monitor CIWA, COWS, AIMS and Fall Risk screenings as ordered, Perform wound care treatments as ordered.  Evaluation of Outcomes: Not Progressing   LCSW Treatment Plan for Primary Diagnosis: MDD (major depressive disorder), recurrent severe, without psychosis (HCC) Long Term Goal(s): Safe transition to appropriate next level of care at discharge, Engage patient in therapeutic group addressing interpersonal concerns.  Short Term Goals: Engage patient in aftercare planning with referrals and resources, Increase social support, Increase ability to appropriately verbalize  feelings, Increase emotional regulation, Facilitate acceptance of mental health diagnosis and concerns, Facilitate patient progression through stages of change regarding substance use diagnoses and concerns, Identify triggers associated with mental health/substance abuse issues, and Increase skills for wellness and recovery  Therapeutic Interventions: Assess for all discharge needs, 1 to 1 time with Social worker, Explore available resources and support systems, Assess for adequacy in community support network, Educate family and significant other(s) on suicide prevention, Complete Psychosocial Assessment, Interpersonal group therapy.  Evaluation of Outcomes: Not Progressing   Progress in Treatment: Attending groups: Yes. Participating in groups: Yes. Taking medication as prescribed: Yes. Toleration medication: Yes. Family/Significant other contact made: No, will contact:  consents pending Patient understands diagnosis: Yes. Discussing patient identified problems/goals with staff: Yes. Medical problems stabilized or resolved: Yes. Denies suicidal/homicidal ideation: Yes. Issues/concerns per patient self-inventory: No.  New problem(s) identified: No, Describe:  none  New Short Term/Long Term Goal(s): medication stabilization, elimination of SI thoughts, development of comprehensive mental wellness plan, sobriety wellness plan.    Patient Goals:  Medication management, get into a long term treatment program for substance use, lower my anger  Discharge Plan or Barriers: Patient recently admitted. CSW will continue to follow and assess for appropriate referrals and possible discharge planning.    Reason for Continuation of Hospitalization: Depression Medication stabilization Suicidal ideation Other; describe substance use  Estimated Length of Stay: 5-7 days  Last 3 [place] Suicide Severity Risk Score: Flowsheet Row [facility] from [facility]03/2023 in [facility] ED from 06/09/2023 in [facility] Emergency Department at [facility]  C-SSRS RISK CATEGORY High Risk High Risk Error: Q3, 4, or 5 should not be populated when Q2 is No    Last PHQ 2/9 Scores:    02/18/2023   10:32 AM 02/09/2023   11:35 AM 07/30/2021    2:27 PM  Depression screen PHQ 2/9  Decreased Interest 3 3 0  Down, Depressed, Hopeless 3 3 0  PHQ - 2 Score 6 6 0  Altered sleeping 2 3 0  Tired, decreased energy 0  0  Change in appetite 2 3 0  Feeling bad or failure about yourself  3 3 0  Trouble concentrating 2 3 0  Moving slowly or fidgety/restless 1 3 0  Suicidal thoughts 0 3 0  PHQ-9 Score 16 24 0  Difficult doing work/chores Very difficult  Not difficult at all    Scribe for Treatment Team: Jenkins LULLA Primer, LCSWA 10/05/2023 1:15 PM

## 2023-10-06 LAB — URINALYSIS, W/ REFLEX TO CULTURE (INFECTION SUSPECTED)
Bilirubin Urine: NEGATIVE
Glucose, UA: 150 mg/dL — AB
Hgb urine dipstick: NEGATIVE
Ketones, ur: NEGATIVE mg/dL
Leukocytes,Ua: NEGATIVE
Nitrite: NEGATIVE
Protein, ur: NEGATIVE mg/dL
Specific Gravity, Urine: 1.019 (ref 1.005–1.030)
pH: 5 (ref 5.0–8.0)

## 2023-10-06 LAB — GLUCOSE, CAPILLARY
Glucose-Capillary: 160 mg/dL — ABNORMAL HIGH (ref 70–99)
Glucose-Capillary: 196 mg/dL — ABNORMAL HIGH (ref 70–99)
Glucose-Capillary: 179 mg/dL — ABNORMAL HIGH (ref 70–99)
Glucose-Capillary: 237 mg/dL — ABNORMAL HIGH (ref 70–99)

## 2023-10-06 LAB — LIPID PANEL
Cholesterol: 141 mg/dL (ref 0–200)
HDL: 77 mg/dL (ref >40)
LDL Cholesterol: 47 mg/dL (ref 0–99)
Total CHOL/HDL Ratio: 1.8 ratio
Triglycerides: 87 mg/dL (ref <150)
VLDL: 17 mg/dL (ref 0–40)

## 2023-10-06 MED ORDER — ESCITALOPRAM OXALATE 10 MG PO TABS
10.0000 mg | ORAL_TABLET | Freq: Every day | ORAL | Status: DC
  Administered 2023-10-06 – 2023-10-11 (×6): 10 mg via ORAL
  Filled 2023-10-06 (×6): qty 1

## 2023-10-06 MED ORDER — ARIPIPRAZOLE 2 MG PO TABS
3.0000 mg | ORAL_TABLET | Freq: Once | ORAL | Status: AC
  Administered 2023-10-06: 3 mg via ORAL
  Filled 2023-10-06: qty 2

## 2023-10-06 MED ORDER — ARIPIPRAZOLE 5 MG PO TABS
5.0000 mg | ORAL_TABLET | Freq: Every day | ORAL | Status: DC
  Administered 2023-10-07 – 2023-10-11 (×5): 5 mg via ORAL
  Filled 2023-10-06 (×5): qty 1

## 2023-10-06 MED ORDER — SENNOSIDES-DOCUSATE SODIUM 8.6-50 MG PO TABS
1.0000 | ORAL_TABLET | Freq: Two times a day (BID) | ORAL | Status: AC
  Administered 2023-10-06 (×2): 1 via ORAL
  Filled 2023-10-06 (×2): qty 1

## 2023-10-06 NOTE — Progress Notes (Signed)
 Urine sample cup provided to patient per MD order. Pt asleep at this time and will not wake up. Will encourage pt to provide sample when awake.

## 2023-10-06 NOTE — Group Note (Signed)
 LCSW Group Therapy Note   Group Date: 10/06/2023 Start Time: 1100 End Time: 1200   Participation:  did not attend  Type of Therapy:  Group Therapy  Topic:  Shining from Within:  Confidence and Self-Love Journey  Objective:  To support participants in developing confidence and self-love through self-awareness, self-compassion, and practical skills that nurture personal growth.   Group Goals Encourage self-reflection and self-acceptance by identifying personal strengths and achievements. Teach skills to challenge negative self-talk and replace it with supportive, truthful self-talk. Foster resilience and self-worth through [employer], gratitude, and self-care practices.   Summary:  This group explores the connection between confidence and self-love by guiding participants through reflection, mindset shifts, and practical tools like affirmations, strength recognition, and goal-setting. Activities are designed to promote self-compassion, build emotional resilience, and normalize the slow, patient journey of inner growth.   Therapeutic Modalities Used Cognitive Behavioral Therapy (CBT): Challenging and reframing unhelpful self-talk. Motivational Interviewing (MI): Encouraging small, achievable goals. Elements of Dialectical Behavioral Therapist (DBT):  Mindfulness and Self-Compassion: Promoting present-moment awareness and kindness toward self.   Terrika Zuver O Dodge Ator, LCSWA 10/06/2023  4:56 PM

## 2023-10-06 NOTE — Progress Notes (Signed)
(  Sleep Hours) - 6.75 (Any PRNs that were needed, meds refused, or side effects to meds)- traz prn, no meds refused, no side effects to meds (Any disturbances and when (visitation, over night)- n/a  (Concerns raised by the patient)- n/a (SI/HI/AVH)- passive si, denies hi and AVH

## 2023-10-06 NOTE — Progress Notes (Signed)
Urine sample collected and placed in lab refrigerator.

## 2023-10-06 NOTE — Progress Notes (Addendum)
 Patient endorses passive SI this morning reporting that the thoughts come and go. Pt endorses HI towards people in general but does not have a plan to harm anyone at this time. Pt denies AVH. Pt rates her depression a 8/10 and anxiety a 8/10. Pt reports that she slept poor last night due to nocturia r/t to just now restarting Metformin . . Pt has been calm and cooperative throughout the day. Patient has been compliant with medications and treatment plan. Q 15 minute safety checks are in place for patient's safety. Patient is currently safe on the unit.   10/06/23 0827  Psych Admission Type (Psych Patients Only)  Admission Status Involuntary  Psychosocial Assessment  Patient Complaints Anxiety;Depression (Anxiety 8/10, Depression 8/10)  Eye Contact Fair  Facial Expression Flat  Affect Depressed;Anxious  Speech Logical/coherent  Interaction Assertive  Motor Activity Slow  Appearance/Hygiene Disheveled  Behavior Characteristics Cooperative  Mood Anxious;Depressed  Aggressive Behavior  Targets Other (Comment) (Random people)  Thought Process  Coherency WDL  Content WDL  Delusions None reported or observed  Perception WDL  Hallucination None reported or observed  Judgment Poor  Confusion None  Danger to Self  Current suicidal ideation? Passive (Not right now but it comes and goes)  Self-Injurious Behavior Some self-injurious ideation observed or expressed.  No lethal plan expressed   Agreement Not to Harm Self Yes  Description of Agreement Pt verbally contracts for safety  Danger to Others  Danger to Others Reported or observed  Danger to Others Abnormal  Harmful Behavior to others Threats of violence towards other people observed or expressed   Description of Harmful Behavior  I have throughts of randomly hurting people  Destructive Behavior No threats or harm toward property
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
## 2023-10-06 NOTE — Plan of Care (Signed)
   Problem: Education: Goal: Emotional status will improve Outcome: Progressing Goal: Mental status will improve Outcome: Progressing Goal: Verbalization of understanding the information provided will improve Outcome: Progressing

## 2023-10-06 NOTE — Plan of Care (Signed)
   Problem: Education: Goal: Emotional status will improve Outcome: Progressing Goal: Mental status will improve Outcome: Progressing   Problem: Activity: Goal: Interest or engagement in activities will improve Outcome: Progressing Goal: Sleeping patterns will improve Outcome: Progressing
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
## 2023-10-06 NOTE — Group Note (Signed)
 Date:  10/06/2023 Time:  9:08 PM  Group Topic/Focus:  Wrap-Up Group:   The focus of this group is to help patients review their daily goal of treatment and discuss progress on daily workbooks.    Participation Level:  Active  Participation Quality:  Appropriate  Affect:  Appropriate  Cognitive:  Appropriate  Insight: Appropriate  Engagement in Group:  Engaged  Modes of Intervention:  Education and Exploration  Additional Comments:  Patient attended and participated in group tonight. She reports that her goal for today was to work on aftercare plans. She didn't get information from the SW until after 5pm.  Quincy Prisco Dacosta 10/06/2023, 9:08 PM

## 2023-10-06 NOTE — Progress Notes (Signed)
 Patient ID: Grace Montgomery, female   DOB: [birth year], 33 y.o.   MRN: [id number] CSW spoke with Pt on the unit. She was provided with information on [facility] and [employer]. She was made aware of needing to initiate contact herself to start the process.   CSW team will continue to follow for care coordination and discharge planning.    Jashanti Clinkscale N Flonnie Wierman, LCSW 10/06/23 4:09 PM

## 2023-10-06 NOTE — Progress Notes (Signed)
   10/05/23 2300  Psych Admission Type (Psych Patients Only)  Admission Status Involuntary  Psychosocial Assessment  Patient Complaints Anxiety  Eye Contact Fair  Facial Expression Animated  Affect Anxious  Speech Logical/coherent  Interaction Assertive  Motor Activity Other (Comment) (WNL)  Appearance/Hygiene Disheveled  Behavior Characteristics Anxious  Mood Anxious;Pleasant  Thought Process  Coherency WDL  Content Preoccupation  Delusions None reported or observed  Perception WDL  Hallucination None reported or observed  Judgment Poor  Confusion None  Danger to Self  Current suicidal ideation? Passive  Agreement Not to Harm Self Yes  Description of Agreement Verbal  Danger to Others  Danger to Others None reported or observed

## 2023-10-07 LAB — GLUCOSE, CAPILLARY
Glucose-Capillary: 137 mg/dL — ABNORMAL HIGH (ref 70–99)
Glucose-Capillary: 219 mg/dL — ABNORMAL HIGH (ref 70–99)
Glucose-Capillary: 202 mg/dL — ABNORMAL HIGH (ref 70–99)
Glucose-Capillary: 232 mg/dL — ABNORMAL HIGH (ref 70–99)

## 2023-10-07 MED ORDER — SENNOSIDES-DOCUSATE SODIUM 8.6-50 MG PO TABS
2.0000 | ORAL_TABLET | Freq: Every day | ORAL | Status: DC
  Administered 2023-10-07 – 2023-10-11 (×4): 2 via ORAL
  Filled 2023-10-07 (×5): qty 2

## 2023-10-07 MED ORDER — POLYETHYLENE GLYCOL 3350 17 G PO PACK
17.0000 g | PACK | Freq: Every day | ORAL | Status: DC
  Administered 2023-10-07 – 2023-10-11 (×4): 17 g via ORAL
  Filled 2023-10-07 (×5): qty 1

## 2023-10-07 MED ORDER — SENNOSIDES-DOCUSATE SODIUM 8.6-50 MG PO TABS: 2.0000 | ORAL_TABLET | Freq: Every day | ORAL | Status: DC

## 2023-10-07 MED ORDER — HYDROXYZINE HCL 25 MG PO TABS
25.0000 mg | ORAL_TABLET | Freq: Three times a day (TID) | ORAL | Status: DC | PRN
  Administered 2023-10-07 – 2023-10-10 (×4): 25 mg via ORAL
  Filled 2023-10-07 (×4): qty 1

## 2023-10-07 NOTE — Group Note (Signed)
 Date:  10/07/2023 Time:  9:18 AM  Group Topic/Focus:  Goals Group:   The focus of this group is to help patients establish daily goals to achieve during treatment and discuss how the patient can incorporate goal setting into their daily lives to aide in recovery. Healthy Communication:   The focus of this group is to discuss communication, barriers to communication, as well as healthy ways to communicate with others.    Participation Level:  Active  Participation Quality:  Appropriate  Affect:  Appropriate  Cognitive:  Appropriate  Insight: Appropriate  Engagement in Group:  Engaged  Modes of Intervention:  Discussion and Education  Additional Comments:  Pt was attentive and listened to others during the session.  Marissa Lowrey A Zienna Ahlin 10/07/2023, 9:18 AM

## 2023-10-07 NOTE — Progress Notes (Signed)
(  Sleep Hours) - 10.25 (Any PRNs that were needed, meds refused, or side effects to meds)- trazodone , milk of magnesia (Any disturbances and when (visitation, over night)- none (Concerns raised by the patient)- none  (SI/HI/AVH)- denies

## 2023-10-07 NOTE — Group Note (Deleted)
 Date:  10/07/2023 Time:  11:41 PM  Group Topic/Focus:  Self Care:   The focus of this group is to help patients understand the importance of self-care in order to improve or restore emotional, physical, spiritual, interpersonal, and financial health.     Participation Level:  {BHH PARTICIPATION OZCZO:77735}  Participation Quality:  {BHH PARTICIPATION QUALITY:22265}  Affect:  {BHH AFFECT:22266}  Cognitive:  {BHH COGNITIVE:22267}  Insight: {BHH Insight2:20797}  Engagement in Group:  {BHH ENGAGEMENT IN HMNLE:77731}  Modes of Intervention:  {BHH MODES OF INTERVENTION:22269}  Additional Comments:  ***  Grace Montgomery L 10/07/2023, 11:41 PM

## 2023-10-07 NOTE — Group Note (Signed)
 Recreation Therapy Group Note   Group Topic:Problem Solving  Group Date: 10/07/2023 Start Time: 0930 End Time: 1005 Facilitators: Jisell Majer-McCall, LRT,CTRS Location: [address] Dayroom   Group Topic: Communication, Team Building, Problem Solving  Goal Area(s) Addresses:  Patient will effectively work with peer towards shared goal.  Patient will identify skills used to make activity successful.  Patient will identify how skills used during activity can be used to reach post d/c goals.   Behavioral Response: Engaged  Intervention: STEM Activity  Activity: Straw Bridge. In teams of 3-5, patients were given 15 plastic drinking straws and an equal length of masking tape. Using the materials provided, patients were instructed to build a free standing bridge-like structure to suspend an everyday item (ex: puzzle box) off of the floor or table surface. All materials were required to be used by the team in their design. LRT facilitated post-activity discussion reviewing team process. Patients were encouraged to reflect how the skills used in this activity can be generalized to daily life post discharge.   Education: Pharmacist, community, Scientist, physiological, Discharge Planning   Education Outcome: Acknowledges education/In group clarification offered/Needs additional education.   Affect/Mood: Appropriate   Participation Level: Engaged   Participation Quality: Independent   Behavior: Appropriate   Speech/Thought Process: Focused   Insight: Good   Judgement: Good   Modes of Intervention: STEM Activity   Patient Response to Interventions:  Engaged   Education Outcome:  In group clarification offered    Clinical Observations/Individualized Feedback: Pt was engaged and took turns with peers taking the lead at points. Pt was focused and interactive throughout group.      Plan: Continue to engage patient in RT group sessions 2-3x/week.   Timon Geissinger-McCall, LRT,CTRS 10/07/2023  12:08 PM
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
## 2023-10-07 NOTE — [facility] Group Notes (Signed)
 [facility] Group Notes:  (Nursing/MHT/Case Management/Adjunct)  Date:  10/07/2023  Time:  9:32 PM  Type of Therapy:  Group Therapy  Participation Level:  Active  Participation Quality:  Attentive  Affect:  Appropriate  Cognitive:  Appropriate  Insight:  Appropriate  Engagement in Group:  Developing/Improving  Modes of Intervention:  Education  Summary of Progress/Problems: The patient attended the evening A.A. speaker'Montgomery meeting and was appropriate.   Grace Montgomery 10/07/2023, 9:32 PM

## 2023-10-07 NOTE — Plan of Care (Signed)
   Problem: Education: Goal: Knowledge of [facility] Education information/materials will improve Outcome: Progressing Goal: Emotional status will improve Outcome: Progressing Goal: Mental status will improve Outcome: Progressing Goal: Verbalization of understanding the information provided will improve Outcome: Progressing

## 2023-10-07 NOTE — Plan of Care (Signed)
  Problem: Activity: Goal: Interest or engagement in activities will improve Outcome: Progressing   Problem: Safety: Goal: Periods of time without injury will increase Outcome: Progressing

## 2023-10-08 DIAGNOSIS — L309 Dermatitis, unspecified: Secondary | ICD-10-CM | POA: Diagnosis present

## 2023-10-08 DIAGNOSIS — B372 Candidiasis of skin and nail: Secondary | ICD-10-CM | POA: Diagnosis present

## 2023-10-08 LAB — GLUCOSE, CAPILLARY
Glucose-Capillary: 150 mg/dL — ABNORMAL HIGH (ref 70–99)
Glucose-Capillary: 235 mg/dL — ABNORMAL HIGH (ref 70–99)
Glucose-Capillary: 194 mg/dL — ABNORMAL HIGH (ref 70–99)
Glucose-Capillary: 289 mg/dL — ABNORMAL HIGH (ref 70–99)

## 2023-10-08 LAB — HEMOGLOBIN A1C
Hgb A1c MFr Bld: 7.6 % — ABNORMAL HIGH (ref 4.8–5.6)
Mean Plasma Glucose: 171 mg/dL

## 2023-10-08 MED ORDER — TRIAMCINOLONE ACETONIDE 0.1 % EX CREA
1.0000 | TOPICAL_CREAM | Freq: Two times a day (BID) | CUTANEOUS | Status: DC
  Administered 2023-10-08 – 2023-10-11 (×5): 1 via TOPICAL
  Filled 2023-10-08: qty 15

## 2023-10-08 MED ORDER — KETOCONAZOLE 2 % EX SHAM: 1.0000 | MEDICATED_SHAMPOO | Freq: Every day | CUTANEOUS | Status: DC | PRN

## 2023-10-08 MED ORDER — FLUCONAZOLE 100 MG PO TABS
300.0000 mg | ORAL_TABLET | ORAL | Status: DC
  Administered 2023-10-08: 300 mg via ORAL
  Filled 2023-10-08: qty 3

## 2023-10-08 NOTE — Progress Notes (Signed)
(  Sleep Hours) -7.75 as of 0530 (Any PRNs that were needed, meds refused, or side effects to meds)- Hydroxyzine  25 mg and trazodone  150mg  @ 2135-effective (Any disturbances and when (visitation, over night)-none (Concerns raised by the patient)- none (SI/HI/AVH)- Denies all
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
## 2023-10-08 NOTE — [facility] Group Notes (Signed)
 [facility] Group Notes:  (Nursing/MHT/Case Management/Adjunct)  Date:  10/08/2023  Time:  8:42 PM  Type of Therapy:  Psychoeducational Skills  Participation Level:  Active  Participation Quality:  Appropriate  Affect:  Flat  Cognitive:  Lacking  Insight:  Limited  Engagement in Group:  Developing/Improving  Modes of Intervention:  Education  Summary of Progress/Problems: The patient rated her day as an 8 out of a possible 10 since she did not curse anyone out today. She does not have a goal for today.   Teron Blais S 10/08/2023, 8:42 PM

## 2023-10-08 NOTE — Progress Notes (Addendum)
 D. Pt presented with an improved mood today, reported that she finally had a BM and felt better, less irritable. Pt also reported having had a good interview with [facility], and was expecting to possibly hear back from them by Tuesday.  Pt currently denies SI/HI and AVH and doesn't appear to be responding to internal stimuli. Pt has been visible in the milieu, observed attending group.  A. Labs and vitals monitored. Pt given and educated on medications. Pt supported emotionally and encouraged to express concerns and ask questions.   R. Pt remains safe with 15 minute checks. Will continue POC.    10/08/23 0900  Psych Admission Type (Psych Patients Only)  Admission Status Involuntary  Psychosocial Assessment  Patient Complaints None  Eye Contact Fair  Facial Expression Animated  Affect Appropriate to circumstance  Speech Logical/coherent  Interaction Assertive  Motor Activity Slow  Appearance/Hygiene Unremarkable  Behavior Characteristics Cooperative  Mood Pleasant  Thought Process  Coherency WDL  Content WDL  Delusions None reported or observed  Perception WDL  Hallucination None reported or observed  Judgment Impaired  Confusion None  Danger to Self  Current suicidal ideation? Denies  Danger to Others  Danger to Others None reported or observed  Danger to Others Abnormal  Harmful Behavior to others No threats or harm toward other people

## 2023-10-08 NOTE — Plan of Care (Signed)
   Problem: Education: Goal: Emotional status will improve Outcome: Progressing Goal: Mental status will improve Outcome: Progressing

## 2023-10-08 NOTE — Group Note (Signed)
 Date:  10/08/2023 Time:  9:02 AM  Group Topic/Focus:  Goals Group:   The focus of this group is to help patients establish daily goals to achieve during treatment and discuss how the patient can incorporate goal setting into their daily lives to aide in recovery. Healthy Communication:   The focus of this group is to discuss communication, barriers to communication, as well as healthy ways to communicate with others.    Participation Level:  Active  Participation Quality:  Appropriate  Affect:  Appropriate  Cognitive:  Appropriate  Insight: Appropriate  Engagement in Group:  Engaged and Improving  Modes of Intervention:  Discussion and Education  Additional Comments: Pt discussed goal of waiting to be accepted to [facility]. Pt was engaged and actively listening throughout the group.  Jakari Jacot A Zachary Nole 10/08/2023, 9:02 AM

## 2023-10-08 NOTE — Plan of Care (Signed)
  Problem: Activity: Goal: Interest or engagement in activities will improve Outcome: Progressing   Problem: Safety: Goal: Periods of time without injury will increase Outcome: Progressing

## 2023-10-08 NOTE — Group Note (Signed)
 LCSW Group Therapy Note  @TD @      Type of Therapy and Topic:  Group Therapy: Trust & Honesty  Participation Level:  Active   Description of Group:   In this group patients will be asked to explore value of being honest. Patients will be guide to discuss their thoughts, feelings, and behaviors related to h onety and trusting in others. Patients will process together how trust and honesty relate to how we for relationships with perrs, family members and self. Each patient will be challenged to identify and express feelings of being vulnerable. Patients will discuss reasons why people are dishonest and identify alternative outcomes if one was truthful (to self or others). This group will be process-oriented, with patients participating in exploration of their own experiences as well as giving and receiving support and challenge from other group members.    Therapeutic Goals: Patient will identify why honesty is important or relationships and how honesty overall affects relationships. Patient will identify a situation where they lied or were lied too and the feelings thought process, and behaviors surrounding the situation Patient will identify the meaning of being vulnerable, how that feels, and how that correlate to being honest with self and others Patient will identify situations where they could have told the truth, but instead lied and explain reasons of dishonesty.    Summary of Patient Progress:  The patient share that she does not trust everyone except myself. The patient stated that my mom did me wrong since I was younger, she gave me up. The patient share that she only can trust herself.  Therapeutic Modalities:   Cognitive Behavioral Therapy Solution Focused Therapy Motivational Interviewing Brief Therapy  Adrienne Delay O Marshawn Ninneman, LCSWA 10/08/2023  3:37 PM

## 2023-10-09 LAB — GLUCOSE, CAPILLARY
Glucose-Capillary: 172 mg/dL — ABNORMAL HIGH (ref 70–99)
Glucose-Capillary: 176 mg/dL — ABNORMAL HIGH (ref 70–99)
Glucose-Capillary: 260 mg/dL — ABNORMAL HIGH (ref 70–99)
Glucose-Capillary: 195 mg/dL — ABNORMAL HIGH (ref 70–99)

## 2023-10-09 MED ORDER — MAGNESIUM CITRATE PO SOLN
1.0000 | Freq: Once | ORAL | Status: AC
  Administered 2023-10-09: 1 via ORAL

## 2023-10-09 NOTE — Progress Notes (Addendum)
(  Sleep Hours) - 7 (Any PRNs that were needed, meds refused, or side effects to meds)- Hydroxyzine  and Trazodone  (Any disturbances and when (visitation, over night)- None (Concerns raised by the patient)-  None (SI/HI/AVH)- Denies, contracts for safety

## 2023-10-09 NOTE — Group Note (Signed)
 Date:  10/09/2023 Time:  8:50 AM  Group Topic/Focus:  Goals Group:   The focus of this group is to help patients establish daily goals to achieve during treatment and discuss how the patient can incorporate goal setting into their daily lives to aide in recovery. Orientation:   The focus of this group is to educate the patient on the purpose and policies of crisis stabilization and provide a format to answer questions about their admission.  The group details unit policies and expectations of patients while admitted.    Participation Level:  Active  Participation Quality:  Appropriate  Affect:  Appropriate  Cognitive:  Appropriate  Insight: Appropriate  Engagement in Group:  Engaged  Modes of Intervention:  Orientation  Additional Comments:    Rankin JONETTA Sierra 10/09/2023, 8:50 AM

## 2023-10-09 NOTE — [facility] Group Notes (Signed)
 Adult Psychoeducational Group Note  Date:  10/09/2023 Time:  8:49 PM  Group Topic/Focus:  Wrap-Up Group:   The focus of this group is to help patients review their daily goal of treatment and discuss progress on daily workbooks.  Participation Level:  Active  Participation Quality:  Appropriate  Affect:  Appropriate  Cognitive:  Appropriate  Insight: Appropriate  Engagement in Group:  Engaged  Modes of Intervention:  Discussion and Support  Additional Comments:  Pt told that today was a good day on the unit, the highlight of which was taking a nap. On the subject of goals for the coming week, Pt mentioned hoping to discharge to another facility for aftercare. Pt rated her day an 8 out of 10.  Grace Montgomery 10/09/2023, 8:49 PM

## 2023-10-09 NOTE — Progress Notes (Addendum)
 D. Pt has been visible in the milieu, observed interacting well with peers and staff, and attending group. Pt reported sleeping 'fair' last night, described her appetite as 'fair', energy level as 'low', and concentration as 'good'. Per pt's self inventory, pt rated her depression,hopelessness and anxiety a 5/3/3, respectively. Pt's stated goal today is to be patient while waiting on a response from [facility]. Pt currently denies SI/HI and AVH and doesn't appear to be responding to internal stimuli..  A. Labs and vitals monitored. Pt given and educated on medications. Pt supported emotionally and encouraged to express concerns and ask questions.   R. Pt remains safe with 15 minute checks. Will continue POC.    10/09/23 0900  Psych Admission Type (Psych Patients Only)  Admission Status Involuntary  Psychosocial Assessment  Patient Complaints None  Eye Contact Fair  Facial Expression Animated  Affect Appropriate to circumstance  Speech Logical/coherent  Interaction Assertive  Motor Activity Slow  Appearance/Hygiene Unremarkable  Behavior Characteristics Cooperative  Mood Pleasant  Thought Process  Coherency WDL  Content WDL  Delusions None reported or observed  Perception WDL  Hallucination None reported or observed  Judgment Poor  Confusion None  Danger to Self  Current suicidal ideation? Denies  Danger to Others  Danger to Others None reported or observed  Danger to Others Abnormal  Harmful Behavior to others No threats or harm toward other people  Destructive Behavior No threats or harm toward property

## 2023-10-09 NOTE — Plan of Care (Signed)
   Problem: Education: Goal: Emotional status will improve Outcome: Progressing Goal: Mental status will improve Outcome: Progressing   Problem: Activity: Goal: Interest or engagement in activities will improve Outcome: Progressing

## 2023-10-09 NOTE — Plan of Care (Signed)
   Problem: Education: Goal: Knowledge of [facility] Education information/materials will improve Outcome: Progressing Goal: Emotional status will improve Outcome: Progressing Goal: Mental status will improve Outcome: Progressing Goal: Verbalization of understanding the information provided will improve Outcome: Progressing
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
## 2023-10-10 ENCOUNTER — Encounter (HOSPITAL_COMMUNITY): Payer: Self-pay

## 2023-10-10 DIAGNOSIS — F1420 Cocaine dependence, uncomplicated: Secondary | ICD-10-CM

## 2023-10-10 DIAGNOSIS — Z720 Tobacco use: Secondary | ICD-10-CM

## 2023-10-10 DIAGNOSIS — F1020 Alcohol dependence, uncomplicated: Secondary | ICD-10-CM

## 2023-10-10 LAB — GLUCOSE, CAPILLARY
Glucose-Capillary: 179 mg/dL — ABNORMAL HIGH (ref 70–99)
Glucose-Capillary: 232 mg/dL — ABNORMAL HIGH (ref 70–99)
Glucose-Capillary: 250 mg/dL — ABNORMAL HIGH (ref 70–99)
Glucose-Capillary: 213 mg/dL — ABNORMAL HIGH (ref 70–99)

## 2023-10-10 NOTE — Progress Notes (Signed)
   10/10/23 2300  Psych Admission Type (Psych Patients Only)  Admission Status Involuntary  Psychosocial Assessment  Patient Complaints None  Eye Contact Fair  Facial Expression Animated  Affect Appropriate to circumstance  Speech Logical/coherent  Interaction Assertive  Motor Activity Slow  Appearance/Hygiene Unremarkable  Behavior Characteristics Cooperative  Mood Pleasant  Thought Process  Coherency WDL  Content WDL  Delusions None reported or observed  Perception WDL  Hallucination None reported or observed  Judgment Poor  Confusion None  Danger to Self  Current suicidal ideation? Denies  Agreement Not to Harm Self Yes  Description of Agreement Verbal  Danger to Others  Danger to Others None reported or observed  Danger to Others Abnormal  Harmful Behavior to others No threats or harm toward other people  Destructive Behavior No threats or harm toward property

## 2023-10-10 NOTE — BH IP Treatment Plan (Signed)
 Interdisciplinary Treatment and Diagnostic Plan Update  10/10/2023 Time of Session: THIS IS AN UPDATE Grace Montgomery MRN: [id number]  Principal Diagnosis: MDD (major depressive disorder), recurrent severe, without psychosis (HCC)  Secondary Diagnoses: Principal Problem:   MDD (major depressive disorder), recurrent severe, without psychosis (HCC) Active Problems:   Type 2 diabetes mellitus without complication, without long-term current use of insulin  (HCC)   Marijuana abuse   Cocaine use disorder (HCC)   Alcohol use disorder   Eczema   Candidal dermatitis   Current Medications:  Current Facility-Administered Medications  Medication Dose Route Frequency Provider Last Rate Last Admin   acetaminophen  (TYLENOL ) tablet 650 mg  650 mg Oral Q6H PRN Motley-Mangrum, Jadeka A, PMHNP   650 mg at 10/04/23 1953   alum & mag hydroxide-simeth (MAALOX/MYLANTA) 200-200-20 MG/5ML suspension 30 mL  30 mL Oral Q4H PRN Motley-Mangrum, Jadeka A, PMHNP   30 mL at 10/06/23 1655   ARIPiprazole  (ABILIFY ) tablet 5 mg  5 mg Oral Daily Elodie Palma, MD   5 mg at 10/10/23 0809   haloperidol  (HALDOL ) tablet 5 mg  5 mg Oral TID PRN Motley-Mangrum, Jadeka A, PMHNP       And   diphenhydrAMINE  (BENADRYL ) capsule 50 mg  50 mg Oral TID PRN Motley-Mangrum, Jadeka A, PMHNP       haloperidol  lactate (HALDOL ) injection 5 mg  5 mg Intramuscular TID PRN Motley-Mangrum, Jadeka A, PMHNP       And   diphenhydrAMINE  (BENADRYL ) injection 50 mg  50 mg Intramuscular TID PRN Motley-Mangrum, Jadeka A, PMHNP       And   LORazepam  (ATIVAN ) injection 2 mg  2 mg Intramuscular TID PRN Motley-Mangrum, Jadeka A, PMHNP       haloperidol  lactate (HALDOL ) injection 10 mg  10 mg Intramuscular TID PRN Motley-Mangrum, Jadeka A, PMHNP       And   diphenhydrAMINE  (BENADRYL ) injection 50 mg  50 mg Intramuscular TID PRN Motley-Mangrum, Jadeka A, PMHNP       And   LORazepam  (ATIVAN ) injection 2 mg  2 mg Intramuscular TID PRN Motley-Mangrum,  Jadeka A, PMHNP       escitalopram  (LEXAPRO ) tablet 10 mg  10 mg Oral Daily Elodie Palma, MD   10 mg at 10/10/23 0809   fluconazole  (DIFLUCAN ) tablet 300 mg  300 mg Oral Weekly Delsie Lynwood Morene Lavone, MD   300 mg at 10/08/23 1158   folic acid  (FOLVITE ) tablet 1 mg  1 mg Oral Daily Motley-Mangrum, Jadeka A, PMHNP   1 mg at 10/10/23 0809   hydrOXYzine  (ATARAX ) tablet 25 mg  25 mg Oral TID PRN Lenard Calin, MD   25 mg at 10/09/23 2132   insulin  aspart (novoLOG ) injection 0-15 Units  0-15 Units Subcutaneous TID WC Motley-Mangrum, Jadeka A, PMHNP   5 Units at 10/10/23 1212   insulin  aspart (novoLOG ) injection 0-5 Units  0-5 Units Subcutaneous QHS Motley-Mangrum, Jadeka A, PMHNP   3 Units at 10/08/23 2118   ketoconazole  (NIZORAL ) 2 % shampoo 1 Application  1 Application Topical Daily PRN Delsie Lynwood Morene Lavone, MD       magnesium  hydroxide (MILK OF MAGNESIA) suspension 30 mL  30 mL Oral Daily PRN Motley-Mangrum, Jadeka A, PMHNP   30 mL at 10/06/23 2122   metFORMIN  (GLUCOPHAGE ) tablet 1,000 mg  1,000 mg Oral BID WC Elodie Palma, MD   1,000 mg at 10/10/23 0809   multivitamin with minerals tablet 1 tablet  1 tablet Oral Daily Motley-Mangrum, Jadeka A, PMHNP  1 tablet at 10/10/23 0809   polyethylene glycol (MIRALAX  / GLYCOLAX ) packet 17 g  17 g Oral Daily Lenard Calin, MD   17 g at 10/10/23 9189   senna-docusate (Senokot-S) tablet 2 tablet  2 tablet Oral Daily Lenard Calin, MD   2 tablet at 10/10/23 0809   thiamine  (Vitamin B-1) tablet 100 mg  100 mg Oral Daily Motley-Mangrum, Jadeka A, PMHNP   100 mg at 10/10/23 9190   Or   thiamine  (VITAMIN B1) injection 100 mg  100 mg Intravenous Daily Motley-Mangrum, Jadeka A, PMHNP       traZODone  (DESYREL ) tablet 150 mg  150 mg Oral QHS PRN Elodie Palma, MD   150 mg at 10/09/23 2132   triamcinolone  cream (KENALOG ) 0.1 % cream 1 Application  1 Application Topical BID Delsie Lynwood Morene Lavone, MD   1 Application at 10/09/23 1718    PTA Medications: Medications Prior to Admission  Medication Sig Dispense Refill Last Dose/Taking   albuterol  (VENTOLIN  HFA) 108 (90 Base) MCG/ACT inhaler Inhale 2 puffs into the lungs every 6 (six) hours as needed for wheezing or shortness of breath. 1 each 0    fluconazole  (DIFLUCAN ) 150 MG tablet Take 2 tablets (300 mg total) by mouth once a week. 4 tablet 0    ketoconazole  (NIZORAL ) 2 % shampoo Apply 1 Application topically daily as needed for irritation. 120 mL 0    menthol -cetylpyridinium (CEPACOL) 3 MG lozenge Take 1 lozenge (3 mg total) by mouth every 4 (four) hours as needed for sore throat.      metFORMIN  (GLUCOPHAGE ) 1000 MG tablet Take 1 tablet (1,000 mg total) by mouth 2 (two) times daily with a meal. 60 tablet 0    nystatin  cream (MYCOSTATIN ) Apply 1 Application topically 2 (two) times daily. (Patient taking differently: Apply 1 Application topically 2 (two) times daily as needed for dry skin.) 60 g 0    omeprazole  (PRILOSEC) 20 MG capsule Take 1 capsule (20 mg total) by mouth daily. 30 capsule 3    QUEtiapine  (SEROQUEL ) 100 MG tablet Take 1 tablet (100 mg total) by mouth at bedtime. (Patient not taking: Reported on 10/04/2023) 30 tablet 0    sertraline  (ZOLOFT ) 50 MG tablet Take 1 tablet (50 mg total) by mouth daily. 30 tablet 0    trazodone  (DESYREL ) 300 MG tablet Take 150 mg by mouth at bedtime as needed for sleep.      triamcinolone  cream (KENALOG ) 0.1 % Apply 1 Application topically 2 (two) times daily. (Patient taking differently: Apply 1 Application topically 2 (two) times daily as needed (Irritation).) 80 g 0     Patient Stressors: Financial difficulties   Medication change or noncompliance   Occupational concerns   Substance abuse    Patient Strengths: Motivation for treatment/growth   Treatment Modalities: Medication Management, Group therapy, Case management,  1 to 1 session with clinician, Psychoeducation, Recreational therapy.   Physician Treatment Plan for  Primary Diagnosis: MDD (major depressive disorder), recurrent severe, without psychosis (HCC) Long Term Goal(s):     Short Term Goals: Ability to identify changes in lifestyle to reduce recurrence of condition will improve Ability to verbalize feelings will improve Ability to disclose and discuss suicidal ideas  Medication Management: Evaluate patient's response, side effects, and tolerance of medication regimen.  Therapeutic Interventions: 1 to 1 sessions, Unit Group sessions and Medication administration.  Evaluation of Outcomes: Adequate for Discharge  Physician Treatment Plan for Secondary Diagnosis: Principal Problem:   MDD (major depressive disorder), recurrent severe,  without psychosis (HCC) Active Problems:   Type 2 diabetes mellitus without complication, without long-term current use of insulin  (HCC)   Marijuana abuse   Cocaine use disorder (HCC)   Alcohol use disorder   Eczema   Candidal dermatitis  Long Term Goal(s):     Short Term Goals: Ability to identify changes in lifestyle to reduce recurrence of condition will improve Ability to verbalize feelings will improve Ability to disclose and discuss suicidal ideas     Medication Management: Evaluate patient's response, side effects, and tolerance of medication regimen.  Therapeutic Interventions: 1 to 1 sessions, Unit Group sessions and Medication administration.  Evaluation of Outcomes: Adequate for Discharge   RN Treatment Plan for Primary Diagnosis: MDD (major depressive disorder), recurrent severe, without psychosis (HCC) Long Term Goal(s): Knowledge of disease and therapeutic regimen to maintain health will improve  Short Term Goals: Ability to remain free from injury will improve and Ability to verbalize feelings will improve  Medication Management: RN will administer medications as ordered by provider, will assess and evaluate patient's response and provide education to patient for prescribed medication. RN  will report any adverse and/or side effects to prescribing provider.  Therapeutic Interventions: 1 on 1 counseling sessions, Psychoeducation, Medication administration, Evaluate responses to treatment, Monitor vital signs and CBGs as ordered, Perform/monitor CIWA, COWS, AIMS and Fall Risk screenings as ordered, Perform wound care treatments as ordered.  Evaluation of Outcomes: Adequate for Discharge   LCSW Treatment Plan for Primary Diagnosis: MDD (major depressive disorder), recurrent severe, without psychosis (HCC) Long Term Goal(s): Safe transition to appropriate next level of care at discharge, Engage patient in therapeutic group addressing interpersonal concerns.  Short Term Goals: Engage patient in aftercare planning with referrals and resources, Increase social support, Identify triggers associated with mental health/substance abuse issues, and Increase skills for wellness and recovery  Therapeutic Interventions: Assess for all discharge needs, 1 to 1 time with Social worker, Explore available resources and support systems, Assess for adequacy in community support network, Educate family and significant other(s) on suicide prevention, Complete Psychosocial Assessment, Interpersonal group therapy.  Evaluation of Outcomes: Adequate for Discharge   Progress in Treatment: Attending groups: Yes. Participating in groups: Yes. Taking medication as prescribed: Yes. Toleration medication: Yes. Family/Significant other contact made: No, will contact: declined consents Patient understands diagnosis: Yes. Discussing patient identified problems/goals with staff: Yes. Medical problems stabilized or resolved: Yes. Denies suicidal/homicidal ideation: Yes. Issues/concerns per patient self-inventory: No.   New problem(s) identified: No, Describe:  none   New Short Term/Long Term Goal(s): medication stabilization, elimination of SI thoughts, development of comprehensive mental wellness plan,  sobriety wellness plan.      Patient Goals:  Medication management, get into a long term treatment program for substance use, lower my anger   Discharge Plan or Barriers: Patient recently admitted. CSW will continue to follow and assess for appropriate referrals and possible discharge planning.      Reason for Continuation of Hospitalization: Depression Medication stabilization Suicidal ideation Other; describe substance use   Estimated Length of Stay: tomorrow 8/5 to substance use treatment  Last 3 [place] Suicide Severity Risk Score: Flowsheet Row Admission (Current) [facility] from 10/03/2023 in [facility] at [facility] ED from 06/09/2023 in [facility] Department at [facility]  C-SSRS RISK CATEGORY High Risk High Risk Error: Q3, 4, or 5 should not be populated when Q2 is No    Last PHQ  2/9 Scores:    02/18/2023   10:32 AM 02/09/2023   11:35 AM 07/30/2021    2:27 PM  Depression screen PHQ 2/9  Decreased Interest 3 3 0  Down, Depressed, Hopeless 3 3 0  PHQ - 2 Score 6 6 0  Altered sleeping 2 3 0  Tired, decreased energy 0  0  Change in appetite 2 3 0  Feeling bad or failure about yourself  3 3 0  Trouble concentrating 2 3 0  Moving slowly or fidgety/restless 1 3 0  Suicidal thoughts 0 3 0  PHQ-9 Score 16 24 0  Difficult doing work/chores Very difficult  Not difficult at all    Scribe for Treatment Team: Jenkins LULLA Primer, LCSWA 10/10/2023 4:07 PM
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
## 2023-10-10 NOTE — B[facility] Group Notes (Signed)
 Spirituality Group   Group Goal: Support / Education around grief and loss    Group Description: Following introductions and group rules, group members engaged in facilitated group dialog and support around topic of loss, with particular support around experiences of loss in their lives. Group members identified types of loss (relationships / self / things) as well as patterns, circumstances, and changes that precipitate loss. Reflection invited on thoughts / feelings around loss, normalized grief responses, and recognized variety in grief experience. Group noted Worden's four tasks of grief in discussion. Group drew on Adlerian / Rogerian, narrative, MI, with Yalom's group therapy as a primary framework.   Observations: Donna processed around the loss of her brother and how this grief relates significantly to both her EtOH misuse and feelings toward her mother. These may be fruitful areas for ongoing therapy.  Vandy Tsuchiya L. Fredrica, M.Div [phone]

## 2023-10-10 NOTE — Group Note (Signed)
 Date:  10/10/2023 Time:  9:23 AM  Group Topic/Focus:  Goals Group:   The focus of this group is to help patients establish daily goals to achieve during treatment and discuss how the patient can incorporate goal setting into their daily lives to aide in recovery. Orientation:   The focus of this group is to educate the patient on the purpose and policies of crisis stabilization and provide a format to answer questions about their admission.  The group details unit policies and expectations of patients while admitted.    Participation Level:  Active  Participation Quality:  Appropriate  Affect:  Appropriate  Cognitive:  Appropriate  Insight: Appropriate  Engagement in Group:  Engaged  Modes of Intervention:  Discussion  Additional Comments:  Pt stated her goal was to work on discharge with the team.  Adriana GORMAN Blush 10/10/2023, 9:23 AM

## 2023-10-10 NOTE — Group Note (Signed)
 Recreation Therapy Group Note   Group Topic:Communication  Group Date: 10/10/2023 Start Time: 0930 End Time: 1015 Facilitators: Emilianna Barlowe-McCall, LRT,CTRS Location: [address] Dayroom  Group Topic: Communication, Problem Solving   Goal Area(s) Addresses:  Patient will effectively listen to complete activity.  Patient will identify communication skills used to make activity successful.  Patient will identify how skills used during activity can be used to reach post d/c goals.    Behavioral Response: Engaged   Intervention: Building surveyor Activity - Geometric pattern cards, pencils, blank paper    Activity: Geometric Drawings.  Three volunteers from the peer group will be shown an abstract picture with a particular arrangement of geometrical shapes.  Each round, one 'speaker' will describe the pattern, as accurately as possible without revealing the image to the group.  The remaining group members will listen and draw the picture to reflect how it is described to them. Patients with the role of 'listener' cannot ask clarifying questions but, may request that the speaker repeat a direction. Once the drawings are complete, the presenter will show the rest of the group the picture and compare how close each person came to drawing the picture. LRT will facilitate a post-activity discussion regarding effective communication and the importance of planning, listening, and asking for clarification in daily interactions with others.  Education: Environmental consultant, Active listening, Support systems, Discharge planning  Education Outcome: Acknowledges understanding/In group clarification offered/Needs additional education.     Affect/Mood: Appropriate   Participation Level: Engaged   Participation Quality: Independent   Behavior: Appropriate   Speech/Thought Process: Focused   Insight: Good   Judgement: Good   Modes of Intervention: Communication   Patient Response to  Interventions:  Engaged   Education Outcome:  In group clarification offered    Clinical Observations/Individualized Feedback: Pt was bright and engaged. Pt was focused on getting each drawing correct. Pt also presented one of the drawings to the group. Pt expressed one of the things learned from group is that you have to be clear when speaking to people.    Plan: Continue to engage patient in RT group sessions 2-3x/week.   Grace Montgomery, LRT,CTRS 10/10/2023 11:37 AM

## 2023-10-10 NOTE — Progress Notes (Addendum)
 CSW will continue to follow up with [facility] for possible admission.   3:10 pm Pt has been accepted to [facility] in [place] for long term substance abuse treatment. Pt to discharge 10/11/2023 at 10:30 am.

## 2023-10-10 NOTE — Progress Notes (Signed)
 Adult Psychoeducational Group Note  Date:  10/10/2023 Time:  8:49 PM  Group Topic/Focus:  Wrap-Up Group:   The focus of this group is to help patients review their daily goal of treatment and discuss progress on daily workbooks.  Participation Level:  Active  Participation Quality:  Appropriate  Affect:  Appropriate  Cognitive:  Appropriate  Insight: Appropriate  Engagement in Group:  Engaged  Modes of Intervention:  Discussion  Additional Comments:  she has nothing good to say she guess music  Lang Drilling Long 10/10/2023, 8:49 PM

## 2023-10-10 NOTE — [facility] Group Notes (Signed)
 Adult Psychoeducational Group Note  Date:  10/10/2023 Time:  10:56 PM  Group Topic/Focus:  Wrap-Up Group:   The focus of this group is to help patients review their daily goal of treatment and discuss progress on daily workbooks.  Participation Level:  Active  Participation Quality:  Appropriate  Affect:  Appropriate  Cognitive:  Appropriate  Insight: Appropriate  Engagement in Group:  Engaged  Modes of Intervention:  Discussion  Additional Comments:  Pt. Attended group.  Grace Montgomery 10/10/2023, 10:56 PM

## 2023-10-10 NOTE — Progress Notes (Signed)
(  Sleep Hours) - 5.75  (Any PRNs that were needed, meds refused, or side effects to meds)-  Hydroxyzine  25 mg, Trazodone  150 mg  (Any disturbances and when (visitation, over night)- None  (Concerns raised by the patient)- None  (SI/HI/AVH)- Denies  B/G 176 mg /dl

## 2023-10-10 NOTE — Progress Notes (Signed)
   10/10/23 0809  Psych Admission Type (Psych Patients Only)  Admission Status Involuntary  Psychosocial Assessment  Patient Complaints None  Eye Contact Fair  Facial Expression Animated  Affect Appropriate to circumstance  Speech Logical/coherent  Interaction Assertive  Motor Activity Slow  Appearance/Hygiene Unremarkable  Behavior Characteristics Cooperative  Mood Pleasant  Thought Process  Coherency WDL  Content WDL  Delusions None reported or observed  Perception WDL  Hallucination None reported or observed  Judgment Poor  Confusion None  Danger to Self  Current suicidal ideation? Denies  Agreement Not to Harm Self Yes  Description of Agreement verbal  Danger to Others  Danger to Others None reported or observed  Danger to Others Abnormal  Harmful Behavior to others No threats or harm toward other people  Destructive Behavior No threats or harm toward property

## 2023-10-11 LAB — GLUCOSE, CAPILLARY: Glucose-Capillary: 197 mg/dL — ABNORMAL HIGH (ref 70–99)

## 2023-10-11 MED ORDER — METFORMIN HCL 1000 MG PO TABS: 1000.0000 mg | ORAL_TABLET | Freq: Two times a day (BID) | ORAL | 0 refills | Status: DC

## 2023-10-11 MED ORDER — TRAZODONE HCL 300 MG PO TABS: 150.0000 mg | ORAL_TABLET | Freq: Every evening | ORAL | 0 refills | Status: DC | PRN

## 2023-10-11 MED ORDER — KETOCONAZOLE 2 % EX SHAM: 1.0000 | MEDICATED_SHAMPOO | Freq: Every day | CUTANEOUS | 0 refills | Status: DC | PRN

## 2023-10-11 MED ORDER — HYDROXYZINE HCL 25 MG PO TABS: 25.0000 mg | ORAL_TABLET | Freq: Three times a day (TID) | ORAL | 0 refills | Status: DC | PRN

## 2023-10-11 MED ORDER — TRIAMCINOLONE ACETONIDE 0.1 % EX CREA: 1.0000 | TOPICAL_CREAM | Freq: Two times a day (BID) | CUTANEOUS | 0 refills | Status: DC

## 2023-10-11 MED ORDER — ARIPIPRAZOLE 5 MG PO TABS: 5.0000 mg | ORAL_TABLET | Freq: Every day | ORAL | 0 refills | Status: DC

## 2023-10-11 MED ORDER — VITAMIN B-1 100 MG PO TABS: 100.0000 mg | ORAL_TABLET | Freq: Every day | ORAL | Status: DC

## 2023-10-11 MED ORDER — ADULT MULTIVITAMIN W/MINERALS CH: 1.0000 | ORAL_TABLET | Freq: Every day | ORAL | Status: DC

## 2023-10-11 MED ORDER — ESCITALOPRAM OXALATE 10 MG PO TABS: 10.0000 mg | ORAL_TABLET | Freq: Every day | ORAL | 0 refills | Status: DC

## 2023-10-11 NOTE — Discharge Instructions (Addendum)
 Activity: as tolerated  Diet: heart healthy  Other: -Follow-up with your outpatient psychiatric provider -instructions on appointment date, time, and address (location) are provided to you in discharge paperwork.  -Take your psychiatric medications as prescribed at discharge - instructions are provided to you in the discharge paperwork  -Follow-up with outpatient primary care doctor and other specialists -for management of preventative medicine and chronic medical disease:  - Type 2 DM   -Testing: Follow-up with outpatient provider for abnormal lab results:  - 7.6 A1c (7/31)   -If you are prescribed an atypical antipsychotic medication, we recommend that your outpatient psychiatrist follow routine screening for side effects within 3 months of discharge, including monitoring: AIMS scale, height, weight, blood pressure, fasting lipid panel, HbA1c, and fasting blood sugar.   -Recommend total abstinence from alcohol, tobacco, and other illicit drug use at discharge.   -If your psychiatric symptoms recur, worsen, or if you have side effects to your psychiatric medications, call your outpatient psychiatric provider, 911, [phone] or go to the nearest emergency department.  -If suicidal thoughts occur, immediately call your outpatient psychiatric provider, 911, [phone] or go to the nearest emergency department.

## 2023-10-11 NOTE — Progress Notes (Signed)
 Pt discharged home alert and oriented x 4, calm and cooperative. All belongings returned to pt. Discharge instructions reviewed with pt and pt verbalized understanding of instructions.

## 2023-10-11 NOTE — Transportation (Signed)
 10/11/2023  DONTASIA MIRANDA DOB: 1990-11-17 MRN: [id number]   RIDER WAIVER AND RELEASE OF LIABILITY  For the purposes of helping with transportation needs, [facility] partners with outside transportation providers (taxi companies, [place], Catering manager.) to give [facility] patients or other approved people the choice of on-demand rides Public librarian) to our buildings for non-emergency visits.  By using [employer], I, the person signing this document, on behalf of myself and/or any legal minors (in my care using the [employer]), agree:  Science writer given to me are supplied by independent, outside transportation providers who do not work for, or have any affiliation with, [employer]. [facility] is not a transportation company. [facility] has no control over the quality or safety of the rides I get using [employer]. [facility] has no control over whether any outside ride will happen on time or not. [facility] gives no guarantee on the reliability, quality, safety, or availability on any rides, or that no mistakes will happen. I know and accept that traveling by vehicle (car, truck, SVU, fleeta, bus, taxi, etc.) has risks of serious injuries such as disability, being paralyzed, and death. I know and agree the risk of using [employer] is mine alone, and not [employer]. [employer] are provided as is and as are available. The transportation providers are in charge for all inspections and care of the vehicles used to provide these rides. I agree not to take legal action against [facility], its agents, employees, officers, directors, representatives, insurers, attorneys, assigns, successors, subsidiaries, and affiliates at any time for any reasons related directly or indirectly to using [employer]. I also agree not to take legal action against [facility] or its affiliates for any injury, death, or damage to property caused by or related to using  [employer]. I have read this Waiver and Release of Liability, and I understand the terms used in it and their legal meaning. This Waiver is freely and voluntarily given with the understanding that my right (or any legal minors) to legal action against [facility] relating to [employer] is knowingly given up to use these services.   I attest that I read the Ride Waiver and Release of Liability to Lacinda JONETTA Brewer, gave Ms. Gomes the opportunity to ask questions and answered the questions asked (if any). I affirm that Lacinda JONETTA Brewer then provided consent for assistance with transportation.

## 2023-10-11 NOTE — Progress Notes (Signed)
(  Sleep Hours) - 6.5 (Any PRNs that were needed, meds refused, or side effects to meds)- atarax  and trazodone , no meds refused, no side effects to meds  (Any disturbances and when (visitation, over night)- N/A (Concerns raised by the patient)- N/A (SI/HI/AVH)- DENIES
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
## 2023-10-11 NOTE — Plan of Care (Signed)
   Problem: Education: Goal: Emotional status will improve Outcome: Progressing Goal: Mental status will improve Outcome: Progressing Goal: Verbalization of understanding the information provided will improve Outcome: Progressing
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
## 2023-10-11 NOTE — Progress Notes (Signed)
  [facility] Adult Case Management Discharge Plan :  Will you be returning to the same living situation after discharge:  No.pt will be going to [facility] a inpatient residential substance disorder facility located in [place]  At discharge, do you have transportation home?: Yes,  pt will be transported by [employer] at 10:30 am. Do you have the ability to pay for your medications: Yes,  pt has a  Release of information consent forms completed and in the chart;  Patient's signature needed at discharge.  Patient to Follow up at:  Follow-up Information     [facility] Go on 10/18/2023.   Specialty: Behavioral Health Why: Please go to this provider on 10/18/23 at 7:00 am for an assessment, to receive therapy and medication management services.  You may also go Monday through Friday, arrive by 7:00 am. Contact information: [address] [place]  [address] [phone]        [place] [facility] Go on 10/14/2023.   Specialty: Professional Counselor Why: Please go to this provider on 10/14/23 at 9:00 am for an assessment, to receive therapy services. You may also go Monday through Friday, 9 am to 1 pm. Contact information: [address]  [address] [place] [address] [phone]         [facility] Follow up.   Why: You have been accepted to [facility] for residential substance use. Contact information: [address] [place], [place] [address]                Next level of care provider has access to [facility] Link:yes  Safety Planning and Suicide Prevention discussed: Yes,  Safety Planning completed with patient.     Has patient been referred to the Quitline?: Patient refused referral for treatment  Patient has been referred for addiction treatment: Pt referred to [facility] for inpatient residential- to be admitted 10/11/2023 at 11:15 am.  Benjaman Donia SAUNDERS, LCSWA 10/11/2023, 9:30 AM

## 2023-10-11 NOTE — Progress Notes (Signed)
   10/11/23 0804  Psych Admission Type (Psych Patients Only)  Admission Status Involuntary  Psychosocial Assessment  Patient Complaints Depression;Anxiety  Eye Contact Fair  Facial Expression Animated  Affect Appropriate to circumstance  Speech Logical/coherent  Interaction Assertive  Motor Activity Slow  Appearance/Hygiene Unremarkable  Behavior Characteristics Cooperative  Mood Pleasant  Thought Process  Coherency WDL  Content WDL  Delusions None reported or observed  Perception WDL  Hallucination None reported or observed  Judgment WDL  Confusion None  Danger to Self  Current suicidal ideation? Denies  Agreement Not to Harm Self Yes  Description of Agreement verbal  Danger to Others  Danger to Others None reported or observed  Danger to Others Abnormal  Harmful Behavior to others No threats or harm toward other people  Destructive Behavior No threats or harm toward property

## 2023-12-28 ENCOUNTER — Emergency Department (HOSPITAL_COMMUNITY)
Admission: EM | Admit: 2023-12-28 | Discharge: 2023-12-30 | Disposition: A | Discharge status: Other Acute Inpatient Hospital | Payer: MEDICAID | Attending: Emergency Medicine | Admitting: Emergency Medicine

## 2023-12-28 ENCOUNTER — Encounter (HOSPITAL_COMMUNITY): Payer: Self-pay | Admitting: Emergency Medicine

## 2023-12-28 ENCOUNTER — Other Ambulatory Visit: Payer: Self-pay

## 2023-12-28 DIAGNOSIS — T43012A Poisoning by tricyclic antidepressants, intentional self-harm, initial encounter: Secondary | ICD-10-CM | POA: Insufficient documentation

## 2023-12-28 DIAGNOSIS — R000 Tachycardia, unspecified: Secondary | ICD-10-CM | POA: Diagnosis not present

## 2023-12-28 DIAGNOSIS — R45851 Suicidal ideations: Secondary | ICD-10-CM | POA: Insufficient documentation

## 2023-12-28 DIAGNOSIS — Z7984 Long term (current) use of oral hypoglycemic drugs: Secondary | ICD-10-CM | POA: Insufficient documentation

## 2023-12-28 DIAGNOSIS — N760 Acute vaginitis: Secondary | ICD-10-CM | POA: Diagnosis not present

## 2023-12-28 DIAGNOSIS — D72829 Elevated white blood cell count, unspecified: Secondary | ICD-10-CM | POA: Insufficient documentation

## 2023-12-28 DIAGNOSIS — B9689 Other specified bacterial agents as the cause of diseases classified elsewhere: Secondary | ICD-10-CM

## 2023-12-28 DIAGNOSIS — B3731 Acute candidiasis of vulva and vagina: Secondary | ICD-10-CM | POA: Diagnosis not present

## 2023-12-28 DIAGNOSIS — R4689 Other symptoms and signs involving appearance and behavior: Secondary | ICD-10-CM | POA: Diagnosis present

## 2023-12-28 DIAGNOSIS — R9431 Abnormal electrocardiogram [ECG] [EKG]: Secondary | ICD-10-CM

## 2023-12-28 DIAGNOSIS — E1165 Type 2 diabetes mellitus with hyperglycemia: Secondary | ICD-10-CM | POA: Diagnosis not present

## 2023-12-28 DIAGNOSIS — T50902A Poisoning by unspecified drugs, medicaments and biological substances, intentional self-harm, initial encounter: Secondary | ICD-10-CM

## 2023-12-28 LAB — CBC
HCT: 37.7 % (ref 36.0–46.0)
Hemoglobin: 12.1 g/dL (ref 12.0–15.0)
MCH: 27.1 pg (ref 26.0–34.0)
MCHC: 32.1 g/dL (ref 30.0–36.0)
MCV: 84.5 fL (ref 80.0–100.0)
Platelets: 403 K/uL — ABNORMAL HIGH (ref 150–400)
RBC: 4.46 MIL/uL (ref 3.87–5.11)
RDW: 16.7 % — ABNORMAL HIGH (ref 11.5–15.5)
WBC: 13.3 K/uL — ABNORMAL HIGH (ref 4.0–10.5)
nRBC: 0 % (ref 0.0–0.2)

## 2023-12-28 NOTE — ED Triage Notes (Signed)
 Patient Grace Montgomery c/o SI x 2 weeks. Patient report she have thoughts of hurting herself but no plans. Patient report she's not taking her psych medication for 2 months. Patient denies HI.

## 2023-12-28 NOTE — ED Notes (Addendum)
 Pt has been dressed out into scrubs. Pt has two belongings bags, 1 that contains a pink nike bookbag, and the other contains black purse with her cellphone and other items inside, as well as clothes and a pair of nike sneakers. Bags are located behind triage desk

## 2023-12-29 LAB — URINE DRUG SCREEN
Amphetamines: NEGATIVE
Barbiturates: NEGATIVE
Benzodiazepines: NEGATIVE
Cocaine: NEGATIVE
Fentanyl: NEGATIVE
Methadone Scn, Ur: NEGATIVE
Opiates: NEGATIVE
Tetrahydrocannabinol: NEGATIVE

## 2023-12-29 LAB — CBG MONITORING, ED
Glucose-Capillary: 474 mg/dL — ABNORMAL HIGH (ref 70–99)
Glucose-Capillary: 159 mg/dL — ABNORMAL HIGH (ref 70–99)
Glucose-Capillary: 212 mg/dL — ABNORMAL HIGH (ref 70–99)

## 2023-12-29 LAB — COMPREHENSIVE METABOLIC PANEL WITH GFR
ALT: 13 U/L (ref 0–44)
AST: 16 U/L (ref 15–41)
Albumin: 4.4 g/dL (ref 3.5–5.0)
Alkaline Phosphatase: 112 U/L (ref 38–126)
Anion gap: 13 (ref 5–15)
BUN: 15 mg/dL (ref 6–20)
CO2: 20 mmol/L — ABNORMAL LOW (ref 22–32)
Calcium: 9.9 mg/dL (ref 8.9–10.3)
Chloride: 102 mmol/L (ref 98–111)
Creatinine, Ser: 0.97 mg/dL (ref 0.44–1.00)
GFR, Estimated: >60 mL/min (ref >60)
Glucose, Bld: 342 mg/dL — ABNORMAL HIGH (ref 70–99)
Potassium: 3.9 mmol/L (ref 3.5–5.1)
Sodium: 135 mmol/L (ref 135–145)
Total Bilirubin: 0.3 mg/dL (ref 0.0–1.2)
Total Protein: 8.6 g/dL — ABNORMAL HIGH (ref 6.5–8.1)

## 2023-12-29 LAB — WET PREP, GENITAL
Sperm: NONE SEEN
Trich, Wet Prep: NONE SEEN
WBC, Wet Prep HPF POC: <10 (ref <10)

## 2023-12-29 LAB — HEMOGLOBIN A1C
Hgb A1c MFr Bld: 9 % — ABNORMAL HIGH (ref 4.8–5.6)
Mean Plasma Glucose: 211.6 mg/dL

## 2023-12-29 LAB — ACETAMINOPHEN LEVEL: Acetaminophen (Tylenol), Serum: <10 ug/mL — ABNORMAL LOW (ref 10–30)

## 2023-12-29 LAB — HCG, SERUM, QUALITATIVE: Preg, Serum: NEGATIVE

## 2023-12-29 LAB — ETHANOL: Alcohol, Ethyl (B): <15 mg/dL (ref <15)

## 2023-12-29 LAB — MAGNESIUM: Magnesium: 2.1 mg/dL (ref 1.7–2.4)

## 2023-12-29 LAB — SALICYLATE LEVEL: Salicylate Lvl: <7 mg/dL — ABNORMAL LOW (ref 7.0–30.0)

## 2023-12-29 MED ORDER — INSULIN ASPART 100 UNIT/ML IJ SOLN
0.0000 [IU] | Freq: Three times a day (TID) | INTRAMUSCULAR | Status: DC
  Administered 2023-12-29: 15 [IU] via SUBCUTANEOUS
  Administered 2023-12-29: 2 [IU] via SUBCUTANEOUS
  Administered 2023-12-30 (×2): 8 [IU] via SUBCUTANEOUS
  Administered 2023-12-30: 5 [IU] via SUBCUTANEOUS
  Filled 2023-12-29: qty 0.15

## 2023-12-29 MED ORDER — INSULIN ASPART 100 UNIT/ML IJ SOLN
0.0000 [IU] | Freq: Every day | INTRAMUSCULAR | Status: DC
  Administered 2023-12-29: 2 [IU] via SUBCUTANEOUS
  Filled 2023-12-29: qty 0.05

## 2023-12-29 MED ORDER — MAGNESIUM SULFATE 2 GM/50ML IV SOLN
2.0000 g | Freq: Once | INTRAVENOUS | Status: AC
  Administered 2023-12-29: 2 g via INTRAVENOUS
  Filled 2023-12-29: qty 50

## 2023-12-29 MED ORDER — CLOTRIMAZOLE 1 % VA CREA
1.0000 | TOPICAL_CREAM | Freq: Every day | VAGINAL | Status: DC
  Administered 2023-12-29: 1 via VAGINAL
  Filled 2023-12-29: qty 45

## 2023-12-29 MED ORDER — INSULIN ASPART PROT & ASPART (70-30 MIX) 100 UNIT/ML ~~LOC~~ SUSP
10.0000 [IU] | Freq: Once | SUBCUTANEOUS | Status: AC
  Administered 2023-12-29: 10 [IU] via SUBCUTANEOUS
  Filled 2023-12-29: qty 10

## 2023-12-29 MED ORDER — DIPHENHYDRAMINE HCL 25 MG PO CAPS
50.0000 mg | ORAL_CAPSULE | Freq: Once | ORAL | Status: AC
  Administered 2023-12-29: 50 mg via ORAL
  Filled 2023-12-29: qty 2

## 2023-12-29 MED ORDER — POTASSIUM CHLORIDE CRYS ER 20 MEQ PO TBCR
40.0000 meq | EXTENDED_RELEASE_TABLET | Freq: Once | ORAL | Status: AC
  Administered 2023-12-29: 40 meq via ORAL
  Filled 2023-12-29: qty 2

## 2023-12-29 NOTE — ED Notes (Signed)
 [facility] 402-1 by Chinwendu O. Np pending med clearance in the morning. Dr Prentis is the attending. Please pre admit this patient.

## 2023-12-29 NOTE — ED Notes (Signed)
 Pt medicated with benadryl  50 mg po for vaginal itching and swelling r/t lotrimin cream and c/o difficulty falling asleep.

## 2023-12-29 NOTE — ED Notes (Signed)
 Sitting with Patient and she is lying in bed awake.
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
## 2023-12-29 NOTE — ED Notes (Signed)
 Patient is resting comfortably.

## 2023-12-29 NOTE — Inpatient Diabetes Management (Signed)
 Inpatient Diabetes Program Recommendations  AACE/ADA: New Consensus Statement on Inpatient Glycemic Control   Target Ranges:  Prepandial:   less than 140 mg/dL      Peak postprandial:   less than 180 mg/dL (1-2 hours)      Critically ill patients:  140 - 180 mg/dL    Latest Reference Range & Units 12/28/23 23:20  Glucose 70 - 99 mg/dL 657 (H)   Review of Glycemic Control  Diabetes history: DM2 Outpatient Diabetes medications: Metformin  1000 mg BID Current orders for Inpatient glycemic control: None  Inpatient Diabetes Program Recommendations:    Insulin : Please consider ordering CBGs AC&HS and Novolog  0-15 units TID with meals and Novolog  0-5 units at bedtime.  Thanks, Earnie Gainer, RN, MSN, CDCES Diabetes Coordinator Inpatient Diabetes Program [phone] (Team Pager from 8am to 5pm)

## 2023-12-29 NOTE — ED Notes (Signed)
 Grace Montgomery contacted this writer with c/o of vaginal burning and swelling after use of lotrimin vaginal cream. Message sent to ED Provider.

## 2023-12-29 NOTE — Progress Notes (Signed)
 Pt has been accepted to [facility] on  12/29/2023 . Bed assignment:TBD   Pt meets inpatient criteria per Richerd Ivans, NP   Attending Physician will be  Dr. Prentis  Report can be called to: Adult unit: [phone]  Pt can arrive pending CBG being < 350 for 24 hours. [facility] will continue to coordinate transfer on 10/24.   Care Team Notified: [facility] Linsey Starder.RN, Francis Sayres, Paramedic, Delon Side, RN

## 2023-12-29 NOTE — ED Notes (Signed)
 Pt in TTS at present

## 2023-12-29 NOTE — ED Provider Notes (Signed)
 [place] EMERGENCY DEPARTMENT AT [facility] Provider Note   CSN: [id number] Arrival date & time: 12/28/23  2250     Patient presents with: Suicidal   Grace Montgomery is a 33 y.o. female.   33 yo female with SI, plan- go off balcony on her porch. Was previously on Zoloft  which was helping. Stopped her meds a few months ago for unknown reasons. Called her case worker who advised her to come back to the ER for meds. Denies HI. Drinks alcohol, last drink was Monday. Also uses THC, pills, cocaine (last use 1 month ago).  2 days of vaginal itching, hx of DM, prone to yeast infections. No dc, no odor.   Took 2 trazadone and 3 vistaril  PTA with intent to harm and with hope to sleep.        Prior to Admission medications   Medication Sig Start Date End Date Taking? Authorizing Provider  albuterol  (VENTOLIN  HFA) 108 (90 Base) MCG/ACT inhaler Inhale 2 puffs into the lungs every 6 (six) hours as needed for wheezing or shortness of breath. 06/02/23   Massengill, Rankin, MD  ARIPiprazole  (ABILIFY ) 5 MG tablet Take 1 tablet (5 mg total) by mouth daily. 10/12/23   Lenard Calin, MD  escitalopram  (LEXAPRO ) 10 MG tablet Take 1 tablet (10 mg total) by mouth daily. 10/12/23   Lenard Calin, MD  hydrOXYzine  (ATARAX ) 25 MG tablet Take 1 tablet (25 mg total) by mouth 3 (three) times daily as needed for anxiety. 10/11/23   Lenard Calin, MD  ketoconazole  (NIZORAL ) 2 % shampoo Apply 1 Application topically daily as needed for irritation. 10/11/23   Lenard Calin, MD  metFORMIN  (GLUCOPHAGE ) 1000 MG tablet Take 1 tablet (1,000 mg total) by mouth 2 (two) times daily with a meal. 10/11/23   Lenard Calin, MD  Multiple Vitamin (MULTIVITAMIN WITH MINERALS) TABS tablet Take 1 tablet by mouth daily. 10/12/23   Lenard Calin, MD  omeprazole  (PRILOSEC) 20 MG capsule Take 1 capsule (20 mg total) by mouth daily. 07/25/23   Mayers, Cari S, PA-C  thiamine  (VITAMIN B-1) 100 MG tablet Take 1 tablet (100 mg  total) by mouth daily. 10/12/23   Lenard Calin, MD  trazodone  (DESYREL ) 300 MG tablet Take 0.5 tablets (150 mg total) by mouth at bedtime as needed for sleep. 10/11/23   Lenard Calin, MD  triamcinolone  cream (KENALOG ) 0.1 % Apply 1 Application topically 2 (two) times daily. 10/11/23   Lenard Calin, MD    Allergies: Monistat [tioconazole], Chocolate, Ibuprofen , and Banana    Review of Systems Negative except as per HPI Updated Vital Signs BP 97/74   Pulse 95   Temp 98 F (36.7 C) (Oral)   Resp 17   SpO2 100%   Physical Exam Vitals and nursing note reviewed.  Constitutional:      General: She is not in acute distress.    Appearance: She is well-developed. She is not diaphoretic.  HENT:     Head: Normocephalic and atraumatic.     Mouth/Throat:     Mouth: Mucous membranes are moist.  Eyes:     Extraocular Movements: Extraocular movements intact.     Pupils: Pupils are equal, round, and reactive to light.  Cardiovascular:     Rate and Rhythm: Regular rhythm. Tachycardia present.     Heart sounds: Normal heart sounds.  Pulmonary:     Effort: Pulmonary effort is normal.     Breath sounds: Normal breath sounds.  Abdominal:     Palpations: Abdomen  is soft.     Tenderness: There is no abdominal tenderness.  Musculoskeletal:     Right lower leg: No edema.     Left lower leg: No edema.  Skin:    General: Skin is warm and dry.     Findings: No erythema or rash.  Neurological:     Mental Status: She is alert and oriented to person, place, and time.  Psychiatric:        Mood and Affect: Mood normal.        Speech: Speech normal.        Behavior: Behavior normal. Behavior is cooperative.        Thought Content: Thought content is not paranoid. Thought content includes suicidal ideation. Thought content does not include homicidal ideation. Thought content includes suicidal plan.     (all labs ordered are listed, but only abnormal results are displayed) Labs Reviewed  WET  PREP, GENITAL - Abnormal; Notable for the following components:      Result Value   Yeast Wet Prep HPF POC PRESENT (*)    Clue Cells Wet Prep HPF POC PRESENT (*)    All other components within normal limits  COMPREHENSIVE METABOLIC PANEL WITH GFR - Abnormal; Notable for the following components:   CO2 20 (*)    Glucose, Bld 342 (*)    Total Protein 8.6 (*)    All other components within normal limits  CBC - Abnormal; Notable for the following components:   WBC 13.3 (*)    RDW 16.7 (*)    Platelets 403 (*)    All other components within normal limits  SALICYLATE LEVEL - Abnormal; Notable for the following components:   Salicylate Lvl <7.0 (*)    All other components within normal limits  ACETAMINOPHEN  LEVEL - Abnormal; Notable for the following components:   Acetaminophen  (Tylenol ), Serum <10 (*)    All other components within normal limits  ETHANOL  URINE DRUG SCREEN  HCG, SERUM, QUALITATIVE  MAGNESIUM     EKG: EKG Interpretation Date/Time:  Wednesday December 28 2023 23:22:42 EDT Ventricular Rate:  108 PR Interval:  111 QRS Duration:  87 QT Interval:  471 QTC Calculation: 632 R Axis:   59  Text Interpretation: Sinus tachycardia Borderline repol abnormality, diffuse leads Prolonged QT interval Confirmed by Griselda Norris [phone]) on 12/29/2023 1:24:28 AM  Radiology: No results found.   Procedures   Medications Ordered in the ED  magnesium  sulfate IVPB 2 g 50 mL (0 g Intravenous Stopped 12/29/23 0600)  potassium chloride  SA (KLOR-CON  M) CR tablet 40 mEq (40 mEq Oral Given 12/29/23 0427)                                    Medical Decision Making Amount and/or Complexity of Data Reviewed Labs: ordered.  Risk Prescription drug management.   This patient presents to the ED for concern of suicidal ideation with plan, this involves an extensive number of treatment options, and is a complaint that carries with it a high risk of complications and morbidity.  The  differential diagnosis includes but not limited to psychosis, overdose- arrhythmia, vaginal itching- yeast vs BV   Co morbidities / Chronic conditions that complicate the patient evaluation  Major depressive disorder, diabetes, suicidal ideation, marijuana abuse, general anxiety disorder, cocaine abuse, alcohol disorder   Additional history obtained:  Additional history obtained from EMR External records from outside source obtained and  reviewed including prior labs and imaging on file.  Prior EKG with normal QTc   Lab Tests:  I Ordered, and personally interpreted labs.  The pertinent results include: CBC with nonspecific leukocytosis white count of 13.3.  CMP with potassium of 3.9, bicarb 20, glucose is elevated 342, gap normal, not in DKA.  Magnesium  is normal. Wet prep is positive for yeast and BV.  Treatment deferred due to prolonged QT.  UDS is negative.  Salicylate and acetaminophen  levels are negative.  Alcohol negative.  Cardiac Monitoring: / EKG:  The patient was maintained on a cardiac monitor.  I personally viewed and interpreted the cardiac monitored which showed an underlying rhythm of: Sinus tachycardia, rate 108, QTc prolonged at 632.  QT prolongation new compared to prior. Repeat EKG with improved QT to 447.   Problem List / ED Course / Critical interventions / Medication management  33 year old female presents with suicidal ideation.  States that she stopped her Zoloft  a few months ago for unknown reasons, was talking to her counselor about her plan to jump off of her porch and was encouraged to come to the emergency room to restart medications.  She notes that she took an overdose of her trazodone  (total 600 mg), and her hydroxyzine  (75 mg) and attempt to sleep but admits that this was also an attempt for self-harm.  EKG was obtained, she is not a prolonged QT.  Patient is tachycardic, normotensive/hypertensive.  Discussed with poison control who recommends monitoring  with additional recommendations as below.  Repeat magnesium  and potassium, EKG at 6:30 AM.  If QT is still prolonged, recommend medical admission. Repeat EKG, QT has improved 4-47.  Callback from poison control, patient is medically cleared for behavioral health evaluation and disposition. Patient was seen by behavioral health, she is excepted for admission. Patient was placed under IVC due to intentional overdose on her medications, suicidal ideation with plan to jump from balcony. Patient placed under involuntary commitment due to SI with plan and overdose on her medications. I ordered medication including potassium, Mg    Reevaluation of the patient after these medicines showed that the patient EKG improved, cleared for BH eval and dispo  I have reviewed the patients home medicines and have made adjustments as needed   Consultations Obtained:  I requested consultation with poison control, Franky,  and discussed lab and imaging findings as well as pertinent plan - they recommend: Qtc 632 on EKG from 11:30pm.  Keep K over 4, Mg over 2 Monitor Repeat K and Mg at 6:30am, if QT still prolonged = medical admission   Social Determinants of Health:  Has PCP   Test / Admission - Considered:  Admit to [facility]      Final diagnoses:  Suicidal ideation  Intentional overdose, initial encounter (HCC)  Prolonged Q-T interval on ECG  Vaginal yeast infection  Bacterial vaginosis    ED Discharge Orders     None          Beverley Leita DELENA DEVONNA 12/29/23 [id number]    Griselda Norris, MD 12/29/23 1100

## 2023-12-30 ENCOUNTER — Inpatient Hospital Stay (HOSPITAL_COMMUNITY)
Admission: AD | Admit: 2023-12-30 | Discharge: 2024-01-07 | DRG: 885 | Disposition: A | Discharge status: Home / Self Care | Payer: MEDICAID | Source: Intra-hospital

## 2023-12-30 ENCOUNTER — Other Ambulatory Visit: Payer: Self-pay

## 2023-12-30 ENCOUNTER — Encounter (HOSPITAL_COMMUNITY): Payer: Self-pay | Admitting: Student in an Organized Health Care Education/Training Program

## 2023-12-30 DIAGNOSIS — Z833 Family history of diabetes mellitus: Secondary | ICD-10-CM

## 2023-12-30 DIAGNOSIS — Z91148 Patient's other noncompliance with medication regimen for other reason: Secondary | ICD-10-CM | POA: Diagnosis not present

## 2023-12-30 DIAGNOSIS — Z608 Other problems related to social environment: Secondary | ICD-10-CM | POA: Diagnosis present

## 2023-12-30 DIAGNOSIS — K219 Gastro-esophageal reflux disease without esophagitis: Secondary | ICD-10-CM

## 2023-12-30 DIAGNOSIS — R45851 Suicidal ideations: Secondary | ICD-10-CM | POA: Diagnosis present

## 2023-12-30 DIAGNOSIS — Z555 Less than a high school diploma: Secondary | ICD-10-CM

## 2023-12-30 DIAGNOSIS — Z825 Family history of asthma and other chronic lower respiratory diseases: Secondary | ICD-10-CM

## 2023-12-30 DIAGNOSIS — J4530 Mild persistent asthma, uncomplicated: Secondary | ICD-10-CM | POA: Diagnosis present

## 2023-12-30 DIAGNOSIS — Z9152 Personal history of nonsuicidal self-harm: Secondary | ICD-10-CM | POA: Diagnosis not present

## 2023-12-30 DIAGNOSIS — Z91018 Allergy to other foods: Secondary | ICD-10-CM | POA: Diagnosis not present

## 2023-12-30 DIAGNOSIS — Z888 Allergy status to other drugs, medicaments and biological substances status: Secondary | ICD-10-CM | POA: Diagnosis not present

## 2023-12-30 DIAGNOSIS — F17210 Nicotine dependence, cigarettes, uncomplicated: Secondary | ICD-10-CM | POA: Diagnosis present

## 2023-12-30 DIAGNOSIS — Z91048 Other nonmedicinal substance allergy status: Secondary | ICD-10-CM

## 2023-12-30 DIAGNOSIS — F1024 Alcohol dependence with alcohol-induced mood disorder: Secondary | ICD-10-CM | POA: Diagnosis present

## 2023-12-30 DIAGNOSIS — Z818 Family history of other mental and behavioral disorders: Secondary | ICD-10-CM

## 2023-12-30 DIAGNOSIS — E119 Type 2 diabetes mellitus without complications: Secondary | ICD-10-CM | POA: Diagnosis not present

## 2023-12-30 DIAGNOSIS — Z5901 Sheltered homelessness: Secondary | ICD-10-CM

## 2023-12-30 DIAGNOSIS — Z6841 Body Mass Index (BMI) 40.0 and over, adult: Secondary | ICD-10-CM | POA: Diagnosis not present

## 2023-12-30 DIAGNOSIS — F419 Anxiety disorder, unspecified: Secondary | ICD-10-CM | POA: Diagnosis present

## 2023-12-30 DIAGNOSIS — Z886 Allergy status to analgesic agent status: Secondary | ICD-10-CM

## 2023-12-30 DIAGNOSIS — Z811 Family history of alcohol abuse and dependence: Secondary | ICD-10-CM

## 2023-12-30 DIAGNOSIS — Z813 Family history of other psychoactive substance abuse and dependence: Secondary | ICD-10-CM

## 2023-12-30 DIAGNOSIS — Z823 Family history of stroke: Secondary | ICD-10-CM

## 2023-12-30 DIAGNOSIS — Z79899 Other long term (current) drug therapy: Secondary | ICD-10-CM | POA: Diagnosis not present

## 2023-12-30 DIAGNOSIS — F332 Major depressive disorder, recurrent severe without psychotic features: Principal | ICD-10-CM | POA: Diagnosis present

## 2023-12-30 DIAGNOSIS — Z560 Unemployment, unspecified: Secondary | ICD-10-CM

## 2023-12-30 DIAGNOSIS — F1020 Alcohol dependence, uncomplicated: Secondary | ICD-10-CM | POA: Diagnosis not present

## 2023-12-30 DIAGNOSIS — Z9151 Personal history of suicidal behavior: Secondary | ICD-10-CM | POA: Diagnosis not present

## 2023-12-30 DIAGNOSIS — Z5982 Transportation insecurity: Secondary | ICD-10-CM

## 2023-12-30 DIAGNOSIS — Z5941 Food insecurity: Secondary | ICD-10-CM

## 2023-12-30 DIAGNOSIS — K5900 Constipation, unspecified: Secondary | ICD-10-CM | POA: Diagnosis present

## 2023-12-30 DIAGNOSIS — T43012A Poisoning by tricyclic antidepressants, intentional self-harm, initial encounter: Secondary | ICD-10-CM | POA: Diagnosis not present

## 2023-12-30 DIAGNOSIS — F1520 Other stimulant dependence, uncomplicated: Secondary | ICD-10-CM | POA: Diagnosis present

## 2023-12-30 DIAGNOSIS — F1220 Cannabis dependence, uncomplicated: Secondary | ICD-10-CM | POA: Diagnosis present

## 2023-12-30 DIAGNOSIS — Z832 Family history of diseases of the blood and blood-forming organs and certain disorders involving the immune mechanism: Secondary | ICD-10-CM

## 2023-12-30 DIAGNOSIS — E1165 Type 2 diabetes mellitus with hyperglycemia: Secondary | ICD-10-CM | POA: Diagnosis present

## 2023-12-30 DIAGNOSIS — Z87820 Personal history of traumatic brain injury: Secondary | ICD-10-CM

## 2023-12-30 DIAGNOSIS — Z7984 Long term (current) use of oral hypoglycemic drugs: Secondary | ICD-10-CM

## 2023-12-30 DIAGNOSIS — Z91040 Latex allergy status: Secondary | ICD-10-CM

## 2023-12-30 DIAGNOSIS — F319 Bipolar disorder, unspecified: Secondary | ICD-10-CM | POA: Insufficient documentation

## 2023-12-30 LAB — CBG MONITORING, ED
Glucose-Capillary: 256 mg/dL — ABNORMAL HIGH (ref 70–99)
Glucose-Capillary: 300 mg/dL — ABNORMAL HIGH (ref 70–99)
Glucose-Capillary: 221 mg/dL — ABNORMAL HIGH (ref 70–99)

## 2023-12-30 LAB — GLUCOSE, CAPILLARY: Glucose-Capillary: 234 mg/dL — ABNORMAL HIGH (ref 70–99)

## 2023-12-30 MED ORDER — ESCITALOPRAM OXALATE 10 MG PO TABS
10.0000 mg | ORAL_TABLET | Freq: Every day | ORAL | Status: DC
  Filled 2023-12-30: qty 1

## 2023-12-30 MED ORDER — MELATONIN 3 MG PO TABS: 3.0000 mg | ORAL_TABLET | Freq: Every day | ORAL | Status: DC

## 2023-12-30 MED ORDER — ARIPIPRAZOLE 5 MG PO TABS
5.0000 mg | ORAL_TABLET | Freq: Every day | ORAL | Status: DC
Start: 2023-12-30 — End: 2023-12-31
  Filled 2023-12-30: qty 1

## 2023-12-30 MED ORDER — SEMAGLUTIDE 3 MG PO TABS
3.0000 mg | ORAL_TABLET | Freq: Every day | ORAL | Status: DC
  Administered 2024-01-01 – 2024-01-07 (×6): 3 mg via ORAL

## 2023-12-30 MED ORDER — METFORMIN HCL 500 MG PO TABS
1000.0000 mg | ORAL_TABLET | ORAL | Status: AC
  Administered 2023-12-30: 1000 mg via ORAL
  Filled 2023-12-30: qty 2

## 2023-12-30 MED ORDER — LORAZEPAM 2 MG/ML IJ SOLN: 2.0000 mg | Freq: Three times a day (TID) | INTRAMUSCULAR | Status: DC | PRN

## 2023-12-30 MED ORDER — METFORMIN HCL 500 MG PO TABS
1000.0000 mg | ORAL_TABLET | Freq: Two times a day (BID) | ORAL | Status: DC
  Administered 2023-12-31 – 2024-01-07 (×14): 1000 mg via ORAL
  Filled 2023-12-30 (×14): qty 2

## 2023-12-30 MED ORDER — INSULIN ASPART 100 UNIT/ML IJ SOLN
0.0000 [IU] | Freq: Every day | INTRAMUSCULAR | Status: DC
  Administered 2023-12-31: 4 [IU] via SUBCUTANEOUS
  Administered 2024-01-01: 3 [IU] via SUBCUTANEOUS
  Administered 2024-01-02: 5 [IU] via SUBCUTANEOUS
  Administered 2024-01-04: 4 [IU] via SUBCUTANEOUS
  Administered 2024-01-05: 2 [IU] via SUBCUTANEOUS

## 2023-12-30 MED ORDER — ACETAMINOPHEN 325 MG PO TABS
650.0000 mg | ORAL_TABLET | Freq: Four times a day (QID) | ORAL | Status: DC | PRN
  Filled 2023-12-30: qty 2

## 2023-12-30 MED ORDER — OLANZAPINE 10 MG IM SOLR: 5.0000 mg | Freq: Three times a day (TID) | INTRAMUSCULAR | Status: DC | PRN

## 2023-12-30 MED ORDER — INSULIN ASPART 100 UNIT/ML IJ SOLN
0.0000 [IU] | Freq: Three times a day (TID) | INTRAMUSCULAR | Status: DC
  Administered 2023-12-31 (×2): 8 [IU] via SUBCUTANEOUS
  Administered 2023-12-31: 15 [IU] via SUBCUTANEOUS
  Administered 2024-01-01: 5 [IU] via SUBCUTANEOUS
  Administered 2024-01-01: 11 [IU] via SUBCUTANEOUS
  Administered 2024-01-01: 8 [IU] via SUBCUTANEOUS
  Administered 2024-01-02 (×2): 5 [IU] via SUBCUTANEOUS
  Administered 2024-01-03: 11 [IU] via SUBCUTANEOUS
  Administered 2024-01-03: 5 [IU] via SUBCUTANEOUS

## 2023-12-30 MED ORDER — DIPHENHYDRAMINE HCL 50 MG/ML IJ SOLN: 50.0000 mg | Freq: Three times a day (TID) | INTRAMUSCULAR | Status: DC | PRN

## 2023-12-30 MED ORDER — OLANZAPINE 5 MG PO TBDP: 5.0000 mg | ORAL_TABLET | Freq: Three times a day (TID) | ORAL | Status: DC | PRN

## 2023-12-30 MED ORDER — TRAZODONE HCL 150 MG PO TABS
150.0000 mg | ORAL_TABLET | Freq: Every day | ORAL | Status: DC
  Administered 2023-12-30: 150 mg via ORAL
  Filled 2023-12-30: qty 1

## 2023-12-30 MED ORDER — FLUCONAZOLE 200 MG PO TABS
200.0000 mg | ORAL_TABLET | Freq: Once | ORAL | Status: AC
  Administered 2023-12-30: 200 mg via ORAL
  Filled 2023-12-30: qty 1

## 2023-12-30 MED ORDER — ALBUTEROL SULFATE HFA 108 (90 BASE) MCG/ACT IN AERS: 2.0000 | INHALATION_SPRAY | Freq: Four times a day (QID) | RESPIRATORY_TRACT | Status: DC | PRN

## 2023-12-30 MED ORDER — HALOPERIDOL LACTATE 5 MG/ML IJ SOLN: 10.0000 mg | Freq: Three times a day (TID) | INTRAMUSCULAR | Status: DC | PRN

## 2023-12-30 MED ORDER — HALOPERIDOL 5 MG PO TABS: 5.0000 mg | ORAL_TABLET | Freq: Three times a day (TID) | ORAL | Status: DC | PRN

## 2023-12-30 MED ORDER — HYDROXYZINE HCL 25 MG PO TABS
25.0000 mg | ORAL_TABLET | Freq: Three times a day (TID) | ORAL | Status: DC | PRN
  Administered 2023-12-30 – 2024-01-06 (×8): 25 mg via ORAL
  Filled 2023-12-30 (×7): qty 1

## 2023-12-30 MED ORDER — MELATONIN 3 MG PO TABS
3.0000 mg | ORAL_TABLET | Freq: Every day | ORAL | Status: DC
  Administered 2023-12-30: 3 mg via ORAL
  Filled 2023-12-30: qty 1

## 2023-12-30 MED ORDER — DIPHENHYDRAMINE HCL 25 MG PO CAPS: 50.0000 mg | ORAL_CAPSULE | Freq: Three times a day (TID) | ORAL | Status: DC | PRN

## 2023-12-30 MED ORDER — METRONIDAZOLE 500 MG PO TABS: 500.0000 mg | ORAL_TABLET | Freq: Two times a day (BID) | ORAL | Status: DC

## 2023-12-30 MED ORDER — OLANZAPINE 10 MG IM SOLR: 10.0000 mg | Freq: Three times a day (TID) | INTRAMUSCULAR | Status: DC | PRN

## 2023-12-30 MED ORDER — ALUM & MAG HYDROXIDE-SIMETH 200-200-20 MG/5ML PO SUSP
30.0000 mL | ORAL | Status: DC | PRN
  Administered 2024-01-04: 30 mL via ORAL
  Filled 2023-12-30: qty 30

## 2023-12-30 MED ORDER — MAGNESIUM HYDROXIDE 400 MG/5ML PO SUSP: 30.0000 mL | Freq: Every day | ORAL | Status: DC | PRN

## 2023-12-30 MED ORDER — DIPHENHYDRAMINE HCL 25 MG PO CAPS
50.0000 mg | ORAL_CAPSULE | Freq: Once | ORAL | Status: AC
  Administered 2023-12-30: 50 mg via ORAL
  Filled 2023-12-30: qty 2

## 2023-12-30 MED ORDER — HALOPERIDOL LACTATE 5 MG/ML IJ SOLN: 5.0000 mg | Freq: Three times a day (TID) | INTRAMUSCULAR | Status: DC | PRN

## 2023-12-30 NOTE — Tx Team (Signed)
 Initial Treatment Plan 12/30/2023 6:48 PM AYAN YANKEY FMW:[id number]    PATIENT STRESSORS: Financial difficulties   Health problems   Medication change or noncompliance   Substance abuse     PATIENT STRENGTHS: Communication skills    PATIENT IDENTIFIED PROBLEMS:                      DISCHARGE CRITERIA:  Ability to meet basic life and health needs Medical problems require only outpatient monitoring Withdrawal symptoms are absent or subacute and managed without 24-hour nursing intervention  PRELIMINARY DISCHARGE PLAN: Attend 12-step recovery group  PATIENT/FAMILY INVOLVEMENT: This treatment plan has been presented to and reviewed with the patient, Grace Montgomery.  The patient and family have been given the opportunity to ask questions and make suggestions.  Elouise Wolm Morel, RN 12/30/2023, 6:48 PM

## 2023-12-30 NOTE — Progress Notes (Signed)
   12/30/23 2000  Psych Admission Type (Psych Patients Only)  Admission Status Involuntary  Psychosocial Assessment  Patient Complaints Irritability  Eye Contact Fair  Facial Expression Blank  Affect Blunted  Speech Pressured  Interaction Defensive  Motor Activity Slow  Appearance/Hygiene Disheveled;In scrubs  Behavior Characteristics Cooperative  Mood Depressed;Suspicious;Irritable  Thought Process  Coherency WDL  Content Blaming others  Delusions None reported or observed  Perception WDL  Hallucination None reported or observed  Judgment WDL  Confusion None  Danger to Self  Current suicidal ideation? Denies  Danger to Others  Danger to Others None reported or observed

## 2023-12-30 NOTE — Progress Notes (Signed)
 (  Sleep Hours) -7.25  (Any PRNs that were needed, meds refused, or side effects to meds)- PRN Hydroxyzine   (Any disturbances and when (visitation, over night)-none  (Concerns raised by the patient)- Anxious, Irritable, Sleep medication  (SI/HI/AVH)-denies

## 2023-12-30 NOTE — Plan of Care (Signed)
   Problem: Education: Goal: Emotional status will improve Outcome: Not Progressing Goal: Mental status will improve Outcome: Not Progressing

## 2023-12-30 NOTE — Group Note (Signed)
 Date:  12/30/2023 Time:  9:19 PM  Group Topic/Focus:  Wrap-Up Group:   The focus of this group is to help patients review their daily goal of treatment and discuss progress on daily workbooks.    Participation Level:  Active  Participation Quality:  Appropriate  Affect:  Appropriate  Cognitive:  Appropriate  Insight: Appropriate  Engagement in Group:  Engaged  Modes of Intervention:  Discussion  Additional Comments:  Patient stated that her day was okay considering she just arrived today   Grace Montgomery 12/30/2023, 9:19 PM

## 2023-12-30 NOTE — ED Notes (Signed)
 Patient off unit to [facility] per provider. Patient alert calm, cooperative, no s/s of distress at this time. Discharge information and belongings given to [employer]. Patient ambulatory off unit, escorted and transported by [facility]

## 2023-12-30 NOTE — Tx Team (Signed)
 Initial Treatment Plan 12/30/2023 7:30 PM Grace Montgomery FMW:[id number]    PATIENT STRESSORS: Financial difficulties   Medication change or noncompliance   Occupational concerns   Other: homelessness     PATIENT STRENGTHS: Ability for insight  Capable of independent living  Communication skills    PATIENT IDENTIFIED PROBLEMS: Alterations in mood (Depression, agitation / irritability)    Medication noncompliance I stopped taking my medications 2 months ago.    Homeless I need a place to stay.    Health Concerns (Diabetes, Asthma, headaches)         DISCHARGE CRITERIA:  Improved stabilization in mood, thinking, and/or behavior Verbal commitment to aftercare and medication compliance  PRELIMINARY DISCHARGE PLAN: Outpatient therapy Placement in alternative living arrangements  PATIENT/FAMILY INVOLVEMENT: This treatment plan has been presented to and reviewed with the patient, Grace Montgomery. The patient have been given the opportunity to ask questions and make suggestions.  Loraina Stauffer, RN 12/30/2023, 7:30 PM

## 2023-12-30 NOTE — ED Notes (Signed)
 Pt c/o unable to sleep and vaginal swelling burning medicated with benadryl  was effective for relief.

## 2023-12-30 NOTE — Plan of Care (Signed)
  Problem: Safety: Goal: Periods of time without injury will increase 12/30/2023 1904 by Elouise Wolm CROME, RN Outcome: Progressing

## 2023-12-30 NOTE — ED Provider Notes (Signed)
  [place] EMERGENCY DEPARTMENT AT [facility] Emergency Medicine Observation Re-evaluation Note  Grace Montgomery is a 33 y.o. female, seen on rounds today.  Pt initially presented on 12/28/23 at 2250 to the ED for complaints of  Chief Complaint  Patient presents with   Suicidal   PMHx: Currently, the patient is resting quietly in bed, patient does report that she recently was diagnosed with a yeast infection, and continues to have vulvar discomfort and requests a fluconazole  pill as well as a Benadryl  capsule.  Physical Exam  BP 108/69 (BP Location: Right Arm)   Pulse 73   Temp 98.2 F (36.8 C) (Oral)   Resp 17   SpO2 100%  Physical Exam General: NAD Lungs: Normal effort Psych: Currently calm  ED Course / MDM  EKG:EKG Interpretation Date/Time:  Wednesday December 28 2023 23:22:42 EDT Ventricular Rate:  108 PR Interval:  111 QRS Duration:  87 QT Interval:  471 QTC Calculation: 632 R Axis:   59  Text Interpretation: Sinus tachycardia Borderline repol abnormality, diffuse leads Prolonged QT interval Confirmed by Griselda Norris [phone]) on 12/29/2023 1:24:28 AM  I have reviewed the labs performed to date as well as medications administered while in observation.  Recent changes in the last 24 hours include patient presented yesterday for suicidal ideation with plan, was found to be appropriate for inpatient admission.  She complains that she has had a recent yeast infection, and she still has vulvar itching, reports that the cream that she was given is ineffective and asks for a dose of fluconazole , as well as some Benadryl  for the itching.  Review of labs also demonstrates clue cells, therefore will start patient on a course of Flagyl  for bacterial vaginosis.  Patient's home melatonin as well as home insulin  was ordered by previous team Home medications: Ordered by prior team Diet: Ordered by prior team  Plan  Current plan is for awaiting psychiatric placement.    [name], MD 12/30/23 [phone]

## 2023-12-30 NOTE — Plan of Care (Signed)
   Problem: Education: Goal: Verbalization of understanding the information provided will improve Outcome: Progressing   Problem: Safety: Goal: Periods of time without injury will increase Outcome: Progressing

## 2023-12-30 NOTE — Progress Notes (Signed)
 Pt admitted to [facility] under IVC status from [facility] where she presented for SI with plan to jump over her balcony or overdose on her home medications. Pt is diagnosed with Bipolar, affective disorder. Presents irritable, with blunted affect, brief to avertive eye contact, defensive with pressured speech. Currently denies SI, HI, AVH and pain when assessed. Per pt I just went to the ED because I wanted to get back on my medicines. I'm suicidal, I'm not crazy. I'm not going to no [facility] hall. States she's currently homelessness I can't go back where I was staying. Denies all forms of abuse (sexual, physical & verbal / emotional). Skin assessment done; old excoriations noted under bilateral breast and abdominal folds I have cream in my order for that. Pt's belongings searched, items deemed contraband secured in assigned locker. Pt ambulatory to unit with a steady gait. Unit orientation done, routines discussed, care plan reviewed with pt and admission documents signed. Base line CBG obtained on arrival to unit (DM II). Fluids and meal tray offered, tolerated well. Emotional support, encouragement and reassurance offered. Safety checks initiated at Q 15 minutes intervals; pt remains verbally redirectable at this time.

## 2023-12-31 DIAGNOSIS — F1020 Alcohol dependence, uncomplicated: Secondary | ICD-10-CM | POA: Insufficient documentation

## 2023-12-31 LAB — GLUCOSE, CAPILLARY
Glucose-Capillary: 282 mg/dL — ABNORMAL HIGH (ref 70–99)
Glucose-Capillary: 398 mg/dL — ABNORMAL HIGH (ref 70–99)
Glucose-Capillary: 268 mg/dL — ABNORMAL HIGH (ref 70–99)
Glucose-Capillary: 325 mg/dL — ABNORMAL HIGH (ref 70–99)

## 2023-12-31 MED ORDER — TRAZODONE HCL 150 MG PO TABS
150.0000 mg | ORAL_TABLET | Freq: Every evening | ORAL | Status: DC | PRN
  Administered 2023-12-31 – 2024-01-06 (×7): 150 mg via ORAL
  Filled 2023-12-31 (×7): qty 1

## 2023-12-31 MED ORDER — SERTRALINE HCL 50 MG PO TABS
50.0000 mg | ORAL_TABLET | Freq: Every day | ORAL | Status: DC
  Administered 2023-12-31 – 2024-01-07 (×7): 50 mg via ORAL
  Filled 2023-12-31 (×7): qty 1

## 2023-12-31 NOTE — [facility] Group Notes (Signed)
 Adult Psychoeducational Group Note  Date:  12/31/2023 Time:  6:02 AM  Group Topic/Focus:  Wrap-Up Group:   The focus of this group is to help patients review their daily goal of treatment and discuss progress on daily workbooks.  Participation Level:  Did Not Attend    Additional Comments:  Pt did not attend group.  Grace Montgomery [name] 12/31/2023, 6:02 AM

## 2023-12-31 NOTE — Group Note (Signed)
 Date:  12/31/2023 Time:  10:38 AM  Group Topic/Focus:   Social Wellness: To foster a sense of well-being, connection, and personal growth within a supportive community. Such groups focus on improving various aspects of mental, emotional, and social health while encouraging positive interactions and self-awareness. Anger Iceberg: To help participants gain insight into the complex nature of anger and explore the deeper, often hidden emotions that lie beneath it. To understand how emotions can be presented as anger towards others.   Participation Level:  Did Not Attend   Grace Montgomery 12/31/2023, 10:38 AM

## 2023-12-31 NOTE — [facility] Suicide Risk Assessment (Signed)
 Suicide Risk Assessment  Admission Assessment    [facility] Admission Suicide Risk Assessment   Nursing information obtained from:  Patient Demographic factors:  Adolescent or young adult, Low socioeconomic status, Living alone, Unemployed Current Mental Status:  Self-harm thoughts (Prior to admission) Loss Factors:  Decrease in vocational status, Financial problems / change in socioeconomic status, Loss of significant relationship (I'm not in touch with my family) Historical Factors:  Impulsivity (H/O polysubstance abuse in the past) Risk Reduction Factors:  Responsible for children under 40 years of age  Total Time spent with patient: 45 minutes Principal Problem: MDD (major depressive disorder), recurrent episode, severe (HCC) Diagnosis:  Principal Problem:   MDD (major depressive disorder), recurrent episode, severe (HCC) Active Problems:   Moderate cannabis use disorder (HCC)   Moderate stimulant use disorder (HCC)   Alcohol use disorder, severe, dependence (HCC)   Subjective Data:   Grace Montgomery is a 33 y.o., female with a past psychiatric history significant for MDD, stimulant use disorder cocaine type, ecstasy use, and alcohol use disorder who presents to the [facility] Voluntary from [facility] Emergency Department for evaluation and management of SI with a plan to jump off her balcony on the porch.  Of note she also took 2 trazodone  and 3 Vistaril  tablets prior to admission with intent to harm herself.   Initial assessment on 10/25, patient was evaluated on the inpatient unit, the patient reports coming to [facility] because I need to start my medications again.  She states that she was discharged from this hospital in July but was unable to pick up her medications from the pharmacy.  She states that she was discharged from [facility] in July [facility] which she stayed for almost 2 months but she was kicked out due to having an opinion.  She states that after she  left [facility], she was essentially staying at different friends houses or on the streets.  She states that her mother would allow her to take a shower at her home but would not let her stay there.  She reports feeling frustrated towards her mother because there was a lot of pressure for her to get a job but she was having difficulty doing this quickly.  She is open to going to a place called [facility] in [place] for recovery services.   She states that she was intoxicated on alcohol on Wednesday and made statements that she wanted to kill herself.  She is currently denying SI and states that she is feeling overwhelmed.  She is contracting for safety.  She states that when she was previously hospitalized, she was put on Zoloft , trazodone , and Vistaril  and she felt that it was helpful for her mood and would like to be placed back on that medication regimen.     She denies HI, AVH, symptoms of PTSD and mania.  Continued Clinical Symptoms:  Alcohol Use Disorder Identification Test Final Score (AUDIT): 8 The Alcohol Use Disorders Identification Test, Guidelines for Use in Primary Care, Second Edition.  World [employer] Writer [facility]). Score between 0-7:  no or low risk or alcohol related problems. Score between 8-15:  moderate risk of alcohol related problems. Score between 16-19:  high risk of alcohol related problems. Score 20 or above:  warrants further diagnostic evaluation for alcohol dependence and treatment.   CLINICAL FACTORS:   Severe Anxiety and/or Agitation Depression:   Anhedonia Comorbid alcohol abuse/dependence Hopelessness Impulsivity Insomnia   Musculoskeletal: Strength & Muscle Tone: within  normal limits Gait & Station: normal Patient leans: N/A  Psychiatric Specialty Exam:  Psychiatric Status Exam  Presentation  General Appearance: Appropriate for Environment; Disheveled   Eye Contact:Fair   Speech:Clear and Coherent   Speech  Volume:Normal   Handedness:Right   Mood and Affect  Mood:Depressed; Hopeless   Affect:Depressed; Congruent    Thought Process  Thought Processes:Coherent; Linear; Goal Directed   Past Diagnosis of Schizophrenia or Psychoactive disorder: No  Descriptions of Associations:Intact   Orientation:Full (Time, Place and Person)   Thought Content:Logical   Hallucinations:Hallucinations: None   Ideas of Reference:None   Suicidal Thoughts:Suicidal Thoughts: No   Homicidal Thoughts:Homicidal Thoughts: No    Sensorium  Memory:Remote Good   Judgment:Impaired   Insight:Fair    Executive Functions  Concentration:Fair   Attention Span:Fair   Recall:Fair   Fund of Knowledge:Fair   Language:Fair    Psychomotor Activity  Psychomotor Activity:Psychomotor Activity: Normal    Assets  Assets:Resilience; Desire for Improvement; Communication Skills  Physical Exam  General: Pleasant, well-appearing. No acute distress. Pulmonary: Normal effort. No wheezing or rales. Skin: No obvious rash or lesions. Neuro: A&Ox3.No focal deficit.  Review of Systems  No reported symptoms   COGNITIVE FEATURES THAT CONTRIBUTE TO RISK:  Closed-mindedness    SUICIDE RISK:  Moderate:  Frequent suicidal ideation with limited intensity, and duration, some specificity in terms of plans, no associated intent, good self-control, limited dysphoria/symptomatology, some risk factors present, and identifiable protective factors, including available and accessible social support.  PLAN OF CARE: See H&P for assessment and plan.   I certify that inpatient services furnished can reasonably be expected to improve the patient's condition.   Ismael KATHEE Franco, MD 12/31/2023, 1:17 PM

## 2023-12-31 NOTE — Plan of Care (Signed)
?  Problem: Education: ?Goal: Emotional status will improve ?Outcome: Progressing ?Goal: Verbalization of understanding the information provided will improve ?Outcome: Progressing ?  ?Problem: Education: ?Goal: Mental status will improve ?Outcome: Not Progressing ?  ?

## 2023-12-31 NOTE — Group Note (Signed)
 LCSW Group Therapy Note   Group Date: 12/31/2023 Start Time: 1020 End Time: 1145   Type of Therapy and Topic:  Group Therapy: Boundaries  Participation Level:  Active  Description of Group: This group will address the use of boundaries in their personal lives. Patients will explore why boundaries are important, the difference between healthy and unhealthy boundaries, and negative and postive outcomes of different boundaries and will look at how boundaries can be crossed.  Patients will be encouraged to identify current boundaries in their own lives and identify what kind of boundary is being set. Facilitators will guide patients in utilizing problem-solving interventions to address and correct types boundaries being used and to address when no boundary is being used. Understanding and applying boundaries will be explored and addressed for obtaining and maintaining a balanced life. Patients will be encouraged to explore ways to assertively make their boundaries and needs known to significant others in their lives, using other group members and facilitator for role play, support, and feedback.  Therapeutic Goals:  1.  Patient will identify areas in their life where setting clear boundaries could be  used to improve their life.  2.  Patient will identify signs/triggers that a boundary is not being respected. 3.  Patient will identify two ways to set boundaries in order to achieve balance in  their lives: 4.  Patient will demonstrate ability to communicate their needs and set boundaries  through discussion and/or role plays  Summary of Patient Progress:  She was present/active throughout the session and proved open to feedback from CSW and peers. Patient was engaged in activity to include being able to verbalize her thoughts I attract broken people. She was respectful of peers, and was present throughout the entire session.  Therapeutic Modalities:   Cognitive Behavioral  Therapy Solution-Focused Therapy  Golda Louder, LCSWA 12/31/2023  3:41 PM
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
## 2023-12-31 NOTE — Group Note (Signed)
 Date:  12/31/2023 Time:  10:03 AM  Group Topic/Focus:  Goals Group:   The focus of this group is to help patients establish daily goals to achieve during treatment and discuss how the patient can incorporate goal setting into their daily lives to aide in recovery. Orientation:   The focus of this group is to educate the patient on the purpose and policies of crisis stabilization and provide a format to answer questions about their admission.  The group details unit policies and expectations of patients while admitted.    Participation Level:  Did Not Attend  Grace Montgomery 12/31/2023, 10:03 AM

## 2023-12-31 NOTE — Progress Notes (Signed)
   12/31/23 1000  Psych Admission Type (Psych Patients Only)  Admission Status Voluntary  Psychosocial Assessment  Patient Complaints Irritability  Eye Contact Fair  Facial Expression Blank  Affect Blunted  Speech Pressured  Interaction Defensive  Motor Activity Slow  Appearance/Hygiene Disheveled  Behavior Characteristics Appropriate to situation  Mood Depressed;Suspicious;Irritable  Thought Process  Coherency WDL  Content Blaming others  Delusions None reported or observed  Perception WDL  Hallucination None reported or observed  Judgment WDL  Confusion None  Danger to Self  Current suicidal ideation? Denies  Description of Suicide Plan No Plan  Agreement Not to Harm Self Yes  Description of Agreement Verbal  Danger to Others  Danger to Others None reported or observed
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
## 2023-12-31 NOTE — Group Note (Deleted)
 Date:  12/31/2023 Time:  7:46 PM  Group Topic/Focus:  Wrap-Up Group:   The focus of this group is to help patients review their daily goal of treatment and discuss progress on daily workbooks.  Did Not Attend AA group or Wrap up Group.   Participation Level:  {BHH PARTICIPATION OZCZO:77735}  Participation Quality:  {BHH PARTICIPATION QUALITY:22265}  Affect:  {BHH AFFECT:22266}  Cognitive:  {BHH COGNITIVE:22267}  Insight: {BHH Insight2:20797}  Engagement in Group:  {BHH ENGAGEMENT IN GROUP:22268}  Modes of Intervention:  {BHH MODES OF INTERVENTION:22269}  Additional Comments:  ***  Grace Montgomery 12/31/2023, 7:46 PM

## 2023-12-31 NOTE — Group Note (Signed)
 Date:  12/31/2023 Time:  12:49 PM  Group Topic/Focus: Social Work   Pt did attend social work group   Grace Montgomery 12/31/2023, 12:49 PM

## 2023-12-31 NOTE — Group Note (Signed)
 Date:  12/31/2023 Time:  5:28 PM  Group Topic/Focus:  Healthy Communication:   The focus of this group is to discuss communication, barriers to communication, as well as healthy ways to communicate with others. We went over the 5 love languages in the lesson book provided.    Participation Level:  Active  Participation Quality:  Appropriate  Affect:  Appropriate  Cognitive:  Appropriate  Insight: Appropriate  Engagement in Group:  Engaged  Modes of Intervention:  Discussion and Education  Additional Comments:    Juliene CHRISTELLA Huddle 12/31/2023, 5:28 PM

## 2023-12-31 NOTE — BHH Group Notes (Signed)
 B[facility] Group Notes:  (Nursing/MHT/Case Management/Adjunct)  Date:  12/31/2023  Time:  9:41 PM  Type of Therapy:  Wrap-up group  Participation Level:  Active  Participation Quality:  Appropriate  Affect:  Appropriate  Cognitive:  Appropriate  Insight:  Appropriate  Engagement in Group:  Engaged  Modes of Intervention:  Education  Summary of Progress/Problems: Goal to talk to Dr. Jackee day 5/10.  Grace Montgomery 12/31/2023, 9:41 PM

## 2024-01-01 LAB — GLUCOSE, CAPILLARY
Glucose-Capillary: 257 mg/dL — ABNORMAL HIGH (ref 70–99)
Glucose-Capillary: 249 mg/dL — ABNORMAL HIGH (ref 70–99)
Glucose-Capillary: 276 mg/dL — ABNORMAL HIGH (ref 70–99)
Glucose-Capillary: 333 mg/dL — ABNORMAL HIGH (ref 70–99)
Glucose-Capillary: 277 mg/dL — ABNORMAL HIGH (ref 70–99)
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
## 2024-01-01 NOTE — BHH Group Notes (Signed)
 BHH Group Notes:  (Nursing/MHT/Case Management/Adjunct)  Date:  01/01/2024  Time: 8pm  Type of Therapy:  wrap up group  Participation Level:  Active  Participation Quality:  Appropriate  Affect:  Appropriate  Cognitive:  Appropriate  Insight:  Appropriate  Engagement in Group:  Engaged  Modes of Intervention:  Discussion  Summary of Progress/Problems:Appropriate   Alm MARLA Lavender 01/01/2024, 9:58 PM

## 2024-01-01 NOTE — Group Note (Signed)
 Date:  01/01/2024 Time:  11:40 AM  Group Topic/Focus:  Ted Talk & Group Reflection  This group watched a TED Talk on resilience, focusing on overcoming adversity and developing strength through challenges. The talk highlighted the importance of hope, perseverance, and learning from setbacks.  After the video, patient's shared what stood out to them and connected the concept of resilience to their own experiences.Patient's identified strengths and strategies they use to build resilience, such as seeking support and self-care. The group set goals for building resilience, encouraging small, actionable steps moving forward.  The session ended with a focus on self-compassion and practical ways to strengthen resilience. Participants reflected on past challenges they've overcome and were encouraged to take positive steps in the coming week.  Participation Level:  Did Not Attend  Participation Quality:  N/A  Affect:  N/A  Cognitive:  N/A  Insight: None  Engagement in Group:  None  Modes of Intervention:  N/A  Additional Comments:  Grace Montgomery did not attend this wellness group.  [name] 01/01/2024, 11:40 AM

## 2024-01-01 NOTE — Group Note (Deleted)
 Date:  01/01/2024 Time:  10:42 AM  Group Topic/Focus:  Goals Group:   The focus of this group is to help patients establish daily goals to achieve during treatment and discuss how the patient can incorporate goal setting into their daily lives to aide in recovery.     Participation Level:  {BHH PARTICIPATION OZCZO:77735}  Participation Quality:  {BHH PARTICIPATION QUALITY:22265}  Affect:  {BHH AFFECT:22266}  Cognitive:  {BHH COGNITIVE:22267}  Insight: {BHH Insight2:20797}  Engagement in Group:  {BHH ENGAGEMENT IN HMNLE:77731}  Modes of Intervention:  {BHH MODES OF INTERVENTION:22269}  Additional Comments:  ***  Kristi CHRISTELLA Plaza 01/01/2024, 10:42 AM

## 2024-01-01 NOTE — Group Note (Signed)
 Date:  01/01/2024 Time:  6:51 PM  Group Topic/Focus:  Goals Group:   The focus of this group is to help patients establish daily goals to achieve during treatment and discuss how the patient can incorporate goal setting into their daily lives to aide in recovery. Self Care:   The focus of this group is to help patients understand the importance of self-care in order to improve or restore emotional, physical, spiritual, interpersonal, and financial health. Spirituality:   The focus of this group is to discuss how one's spirituality can aide in recovery.    Participation Level:  Active  Participation Quality:  Appropriate and Attentive  Affect:  Appropriate  Cognitive:  Alert and Appropriate  Insight: Appropriate  Engagement in Group:  Engaged  Modes of Intervention:  Discussion and Education  Additional Comments:    Juliene CHRISTELLA Huddle 01/01/2024, 6:51 PM

## 2024-01-01 NOTE — Plan of Care (Signed)

## 2024-01-01 NOTE — Progress Notes (Signed)
   01/01/24 0901  Psych Admission Type (Psych Patients Only)  Admission Status Voluntary  Psychosocial Assessment  Patient Complaints Anxiety  Eye Contact Fair  Facial Expression Anxious  Affect Appropriate to circumstance  Speech Logical/coherent  Interaction Forwards little  Motor Activity Other (Comment) (WDL)  Appearance/Hygiene Unremarkable  Behavior Characteristics Appropriate to situation  Mood Anxious  Thought Process  Coherency WDL  Content Blaming others  Delusions None reported or observed  Perception WDL  Hallucination None reported or observed  Judgment WDL  Confusion None  Danger to Self  Current suicidal ideation? Denies  Agreement Not to Harm Self Yes  Description of Agreement Verba;  Danger to Others  Danger to Others None reported or observed

## 2024-01-01 NOTE — Group Note (Signed)
 Date:  01/01/2024 Time:  10:16 AM  Group Topic/Focus:  Goals Group:   The focus of this group is to help patients establish daily goals to achieve during treatment and discuss how the patient can incorporate goal setting into their daily lives to aide in recovery.    Participation Level:  Did Not Attend  Participation Quality:  N/A  Affect:  N/A  Cognitive:  N/A  Insight: None  Engagement in Group:  None  Modes of Intervention:  N/A  Additional Comments:  Arpi did not attend goals group.  Kristi HERO Britley Gashi 01/01/2024, 10:16 AM

## 2024-01-01 NOTE — Progress Notes (Signed)
   01/01/24 2029  Psych Admission Type (Psych Patients Only)  Admission Status Voluntary  Psychosocial Assessment  Patient Complaints Anxiety  Eye Contact Fair  Facial Expression Anxious  Affect Appropriate to circumstance  Speech Logical/coherent  Interaction Forwards little  Motor Activity Other (Comment) (WDL)  Appearance/Hygiene Unremarkable  Behavior Characteristics Appropriate to situation  Mood Anxious  Thought Process  Coherency WDL  Content Blaming others  Delusions None reported or observed  Perception WDL  Hallucination None reported or observed  Judgment Poor  Confusion None  Danger to Self  Current suicidal ideation? Denies  Agreement Not to Harm Self Yes  Description of Agreement verbal  Danger to Others  Danger to Others None reported or observed

## 2024-01-01 NOTE — Progress Notes (Signed)
 Shift Note  (Sleep Hours) - 7.5  (Any PRNs that were needed, meds refused, or side effects to meds)- PO PRN Hydroxyzine  and Trazodone    (Any disturbances and when (visitation, over night)-None  (Concerns raised by the patient)- None  (SI/HI/AVH)-  Denies

## 2024-01-01 NOTE — Group Note (Signed)
 Date:  01/01/2024 Time:  3:39 PM  Group Topic/Focus:  Bingo - Positive Socialization  The group engaged in a [employer] designed to encourage positive socialization, promote a supportive environment, and enhance cognitive skills. Patients participated actively, demonstrating good communication and cooperation with peers. The activity was structured to create a relaxed, enjoyable atmosphere, fostering a sense of community among participants.  Participation Level:  Active  Participation Quality:  Appropriate and Attentive  Affect:  Appropriate  Cognitive:  Alert and Appropriate  Insight: Appropriate and Improving  Engagement in Group:  Engaged and Improving  Modes of Intervention:  Activity, Problem-solving, and Socialization  Additional Comments:  [name] attended and actively participated in bingo. Winner x5!  [name] 01/01/2024, 3:39 PM

## 2024-01-02 ENCOUNTER — Encounter (HOSPITAL_COMMUNITY): Payer: Self-pay

## 2024-01-02 LAB — GLUCOSE, CAPILLARY
Glucose-Capillary: 245 mg/dL — ABNORMAL HIGH (ref 70–99)
Glucose-Capillary: 257 mg/dL — ABNORMAL HIGH (ref 70–99)
Glucose-Capillary: 449 mg/dL — ABNORMAL HIGH (ref 70–99)
Glucose-Capillary: 369 mg/dL — ABNORMAL HIGH (ref 70–99)

## 2024-01-02 MED ORDER — INSULIN ASPART 100 UNIT/ML IJ SOLN
5.0000 [IU] | Freq: Once | INTRAMUSCULAR | Status: AC
  Administered 2024-01-02: 5 [IU] via SUBCUTANEOUS

## 2024-01-02 NOTE — Progress Notes (Signed)
   01/02/24 0800  Psych Admission Type (Psych Patients Only)  Admission Status Voluntary  Psychosocial Assessment  Patient Complaints Anxiety  Eye Contact Fair  Facial Expression Anxious  Affect Appropriate to circumstance  Speech Logical/coherent  Interaction Forwards little;Arrogant  Motor Activity Other (Comment) (WDL)  Appearance/Hygiene Unremarkable  Behavior Characteristics Appropriate to situation  Mood Anxious  Aggressive Behavior  Targets Self  Type of Behavior Verbal  Effect No apparent injury  Thought Process  Coherency WDL  Content Blaming others  Delusions None reported or observed  Perception WDL  Hallucination None reported or observed  Judgment Poor  Confusion None  Danger to Self  Current suicidal ideation? Denies  Agreement Not to Harm Self Yes  Description of Agreement verbal

## 2024-01-02 NOTE — Group Note (Signed)
 Date:  01/02/2024 Time:  3:28 PM  Group Topic/Focus: Grief and Loss (Chaplain)   Pt did not attend chaplain group   Randal Yepiz R [name] Moger 01/02/2024, 3:28 PM

## 2024-01-02 NOTE — Plan of Care (Signed)
  Problem: Activity: Goal: Interest or engagement in activities will improve Outcome: Progressing   Problem: Coping: Goal: Ability to verbalize frustrations and anger appropriately will improve Outcome: Progressing   Problem: Activity: Goal: Interest or engagement in activities will improve Outcome: Progressing

## 2024-01-02 NOTE — Plan of Care (Signed)
   Problem: Education: Goal: Mental status will improve Outcome: Progressing   Problem: Education: Goal: Verbalization of understanding the information provided will improve Outcome: Progressing

## 2024-01-02 NOTE — Plan of Care (Signed)
   Problem: Education: Goal: Knowledge of [facility] [facility] Education information/materials will improve Outcome: Progressing Goal: Emotional status will improve Outcome: Progressing Goal: Mental status will improve Outcome: Progressing

## 2024-01-02 NOTE — Progress Notes (Incomplete)
(  Sleep Hours) -7  (Any PRNs that were needed, meds refused, or side effects to meds)- hydroxyzine  25mg ; Trazodone  150mg   (Any disturbances and when (visitation, over night)-none  (Concerns raised by the patient)- none  (SI/HI/AVH)-denies  Edited to add: Patient refused to have am labs drawn.

## 2024-01-02 NOTE — Group Note (Signed)
 Date:  01/02/2024 Time:  10:49 AM  Group Topic/Focus: Recreational Therapy  Pt did not attend recreational therapy group  Cy Bresee R Oluwaseun Bruyere 01/02/2024, 10:49 AM

## 2024-01-02 NOTE — Group Note (Signed)
 Recreation Therapy Group Note   Group Topic:Leisure Education  Group Date: 01/02/2024 Start Time: 0936 End Time: 1008 Facilitators: Jaidee Stipe-McCall, LRT,CTRS Location: [address] Dayroom   Group Topic: Leisure Education  Goal Area(s) Addresses:  Patient will successfully identify positive leisure and recreation activities.  Patient will acknowledge benefits of participation in healthy leisure activities post discharge.  Patient will actively work with peers toward a shared goal.   Behavioral Response:    Intervention: Competitive Group Game    Activity: Keep It Contractor. Patients were spread out throughout the room and were to remain seated at all times. Patients were to hit the beach ball to each other as if playing volleyball. Patients were to keep the ball going for as long as they could. Patients would be timed throughout the activity and if the ball came to a complete stop, the time would start over.    Education:  Teacher, English As A Foreign Language, Leisure as [employer]chant [employer] Officer, Programmer, Applications, Building Control Surveyor   Education Outcome: Acknowledges education/In group clarification offered/Needs additional education   Affect/Mood: N/A   Participation Level: Did not attend    Clinical Observations/Individualized Feedback:      Plan: Continue to engage patient in RT group sessions 2-3x/week.   Lennan Malone-McCall, LRT,CTRS  01/02/2024 11:10 AM

## 2024-01-02 NOTE — BH IP Treatment Plan (Unsigned)
 Interdisciplinary Treatment and Diagnostic Plan Update  01/02/2024 Time of Session: 1050 Grace Montgomery MRN: [id number]  Principal Diagnosis: MDD (major depressive disorder), recurrent episode, severe (HCC)  Secondary Diagnoses: Principal Problem:   MDD (major depressive disorder), recurrent episode, severe (HCC) Active Problems:   Moderate cannabis use disorder (HCC)   Moderate stimulant use disorder (HCC)   Alcohol use disorder, severe, dependence (HCC)   Current Medications:  Current Facility-Administered Medications  Medication Dose Route Frequency Provider Last Rate Last Admin   acetaminophen  (TYLENOL ) tablet 650 mg  650 mg Oral Q6H PRN Nkwenti, Doris, NP       albuterol  (VENTOLIN  HFA) 108 (90 Base) MCG/ACT inhaler 2 puff  2 puff Inhalation Q6H PRN Ntuen, Tina C, FNP       alum & mag hydroxide-simeth (MAALOX/MYLANTA) 200-200-20 MG/5ML suspension 30 mL  30 mL Oral Q4H PRN Nkwenti, Doris, NP       haloperidol  (HALDOL ) tablet 5 mg  5 mg Oral TID PRN Onuoha, Chinwendu V, NP       And   diphenhydrAMINE  (BENADRYL ) capsule 50 mg  50 mg Oral TID PRN Onuoha, Chinwendu V, NP       haloperidol  lactate (HALDOL ) injection 5 mg  5 mg Intramuscular TID PRN Onuoha, Chinwendu V, NP       And   diphenhydrAMINE  (BENADRYL ) injection 50 mg  50 mg Intramuscular TID PRN Onuoha, Chinwendu V, NP       And   LORazepam  (ATIVAN ) injection 2 mg  2 mg Intramuscular TID PRN Onuoha, Chinwendu V, NP       haloperidol  lactate (HALDOL ) injection 10 mg  10 mg Intramuscular TID PRN Onuoha, Chinwendu V, NP       And   diphenhydrAMINE  (BENADRYL ) injection 50 mg  50 mg Intramuscular TID PRN Onuoha, Chinwendu V, NP       And   LORazepam  (ATIVAN ) injection 2 mg  2 mg Intramuscular TID PRN Onuoha, Chinwendu V, NP       hydrOXYzine  (ATARAX ) tablet 25 mg  25 mg Oral TID PRN Tex Drilling, NP   25 mg at 01/01/24 2113   insulin  aspart (novoLOG ) injection 0-15 Units  0-15 Units Subcutaneous TID WC Ajibola, Ene A, NP   5  Units at 01/02/24 1634   insulin  aspart (novoLOG ) injection 0-5 Units  0-5 Units Subcutaneous QHS Ajibola, Ene A, NP   3 Units at 01/01/24 2114   magnesium  hydroxide (MILK OF MAGNESIA) suspension 30 mL  30 mL Oral Daily PRN Tex Drilling, NP       metFORMIN  (GLUCOPHAGE ) tablet 1,000 mg  1,000 mg Oral BID WC Ntuen, Tina C, FNP   1,000 mg at 01/02/24 1633   OLANZapine  (ZYPREXA ) injection 10 mg  10 mg Intramuscular TID PRN Tex Drilling, NP       OLANZapine  (ZYPREXA ) injection 5 mg  5 mg Intramuscular TID PRN Tex Drilling, NP       OLANZapine  zydis (ZYPREXA ) disintegrating tablet 5 mg  5 mg Oral TID PRN Tex Drilling, NP       Semaglutide TABS 3 mg  3 mg Oral Daily Ntuen, Tina C, FNP   3 mg at 01/01/24 9097   sertraline  (ZOLOFT ) tablet 50 mg  50 mg Oral Daily Hoang, Daniela B, MD   50 mg at 01/01/24 0902   traZODone  (DESYREL ) tablet 150 mg  150 mg Oral QHS PRN Hoang, Daniela B, MD   150 mg at 01/01/24 2113   PTA Medications: Medications Prior to Admission  Medication Sig Dispense Refill Last Dose/Taking   albuterol  (VENTOLIN  HFA) 108 (90 Base) MCG/ACT inhaler Inhale 2 puffs into the lungs every 6 (six) hours as needed for wheezing or shortness of breath. (Patient taking differently: Inhale 2 puffs into the lungs every 4 (four) hours as needed for wheezing or shortness of breath.) 1 each 0    ARIPiprazole  (ABILIFY ) 5 MG tablet Take 1 tablet (5 mg total) by mouth daily. (Patient not taking: Reported on 12/29/2023) 30 tablet 0    escitalopram  (LEXAPRO ) 10 MG tablet Take 1 tablet (10 mg total) by mouth daily. (Patient not taking: Reported on 12/29/2023) 30 tablet 0    hydrOXYzine  (ATARAX ) 25 MG tablet Take 1 tablet (25 mg total) by mouth 3 (three) times daily as needed for anxiety. (Patient not taking: Reported on 12/29/2023) 30 tablet 0    hydrOXYzine  (VISTARIL ) 25 MG capsule Take 50 mg by mouth at bedtime.      ketoconazole  (NIZORAL ) 2 % shampoo Apply 1 Application topically daily as needed for  irritation. (Patient taking differently: Apply 1 Application topically daily as needed for irritation (SCALP).) 120 mL 0    metFORMIN  (GLUCOPHAGE ) 1000 MG tablet Take 1 tablet (1,000 mg total) by mouth 2 (two) times daily with a meal. 60 tablet 0    Multiple Vitamin (MULTIVITAMIN WITH MINERALS) TABS tablet Take 1 tablet by mouth daily.      omeprazole  (PRILOSEC) 20 MG capsule Take 1 capsule (20 mg total) by mouth daily. (Patient taking differently: Take 20 mg by mouth daily before breakfast.) 30 capsule 3    RYBELSUS 3 MG TABS Take 3 mg by mouth daily.      thiamine  (VITAMIN B-1) 100 MG tablet Take 1 tablet (100 mg total) by mouth daily.      traZODone  (DESYREL ) 150 MG tablet Take 150 mg by mouth at bedtime.      trazodone  (DESYREL ) 300 MG tablet Take 0.5 tablets (150 mg total) by mouth at bedtime as needed for sleep. (Patient not taking: Reported on 12/29/2023) 30 tablet 0    triamcinolone  cream (KENALOG ) 0.1 % Apply 1 Application topically 2 (two) times daily. (Patient taking differently: Apply 1 Application topically See admin instructions. Apply to irritated/affected areas 2 times a day) 30 g 0     Patient Stressors: Financial difficulties   Medication change or noncompliance   Occupational concerns   Other: homelessness    Patient Strengths: Ability for insight  Capable of independent living  Communication skills   Treatment Modalities: Medication Management, Group therapy, Case management,  1 to 1 session with clinician, Psychoeducation, Recreational therapy.   Physician Treatment Plan for Primary Diagnosis: MDD (major depressive disorder), recurrent episode, severe (HCC) Long Term Goal(s):     Short Term Goals: Ability to identify changes in lifestyle to reduce recurrence of condition will improve Ability to verbalize feelings will improve Ability to disclose and discuss suicidal ideas Ability to demonstrate self-control will improve Ability to identify and develop effective  coping behaviors will improve Ability to maintain clinical measurements within normal limits will improve Compliance with prescribed medications will improve Ability to identify triggers associated with substance abuse/mental health issues will improve  Medication Management: Evaluate patient's response, side effects, and tolerance of medication regimen.  Therapeutic Interventions: 1 to 1 sessions, Unit Group sessions and Medication administration.  Evaluation of Outcomes: Progressing  Physician Treatment Plan for Secondary Diagnosis: Principal Problem:   MDD (major depressive disorder), recurrent episode, severe (HCC) Active Problems:   Moderate cannabis  use disorder (HCC)   Moderate stimulant use disorder (HCC)   Alcohol use disorder, severe, dependence (HCC)  Long Term Goal(s):     Short Term Goals: Ability to identify changes in lifestyle to reduce recurrence of condition will improve Ability to verbalize feelings will improve Ability to disclose and discuss suicidal ideas Ability to demonstrate self-control will improve Ability to identify and develop effective coping behaviors will improve Ability to maintain clinical measurements within normal limits will improve Compliance with prescribed medications will improve Ability to identify triggers associated with substance abuse/mental health issues will improve     Medication Management: Evaluate patient's response, side effects, and tolerance of medication regimen.  Therapeutic Interventions: 1 to 1 sessions, Unit Group sessions and Medication administration.  Evaluation of Outcomes: Progressing   RN Treatment Plan for Primary Diagnosis: MDD (major depressive disorder), recurrent episode, severe (HCC) Long Term Goal(s): Knowledge of disease and therapeutic regimen to maintain health will improve  Short Term Goals: Ability to remain free from injury will improve, Ability to demonstrate self-control, Ability to verbalize  feelings will improve, Ability to disclose and discuss suicidal ideas, and Ability to identify and develop effective coping behaviors will improve  Medication Management: RN will administer medications as ordered by provider, will assess and evaluate patient's response and provide education to patient for prescribed medication. RN will report any adverse and/or side effects to prescribing provider.  Therapeutic Interventions: 1 on 1 counseling sessions, Psychoeducation, Medication administration, Evaluate responses to treatment, Monitor vital signs and CBGs as ordered, Perform/monitor CIWA, COWS, AIMS and Fall Risk screenings as ordered, Perform wound care treatments as ordered.  Evaluation of Outcomes: Progressing   LCSW Treatment Plan for Primary Diagnosis: MDD (major depressive disorder), recurrent episode, severe (HCC) Long Term Goal(s): Safe transition to appropriate next level of care at discharge, Engage patient in therapeutic group addressing interpersonal concerns.  Short Term Goals: Engage patient in aftercare planning with referrals and resources, Increase social support, Increase ability to appropriately verbalize feelings, Increase emotional regulation, Facilitate acceptance of mental health diagnosis and concerns, Facilitate patient progression through stages of change regarding substance use diagnoses and concerns, Identify triggers associated with mental health/substance abuse issues, and Increase skills for wellness and recovery  Therapeutic Interventions: Assess for all discharge needs, 1 to 1 time with Social worker, Explore available resources and support systems, Assess for adequacy in community support network, Educate family and significant other(s) on suicide prevention, Complete Psychosocial Assessment, Interpersonal group therapy.  Evaluation of Outcomes: Progressing   Progress in Treatment: Attending groups: Yes. Participating in groups: Yes. Taking medication as  prescribed: Yes. Toleration medication: Yes. Family/Significant other contact made: Yes Patient understands diagnosis: Yes. Discussing patient identified problems/goals with staff: Yes. Medical problems stabilized or resolved: Yes. Denies suicidal/homicidal ideation: Yes Issues/concerns per patient self-inventory: No.   Patient Goals:  Restart my medications  Discharge Plan or Barriers: Medically stabilize, restart outpatient medications, encourage patient to participate in SUD treatment.   Reason for Continuation of Hospitalization: Medication stabilization Suicidal ideation  Estimated Length of Stay: 3-5 days  Last 3 [place] Suicide Severity Risk Score: Flowsheet Row [facility]2023 [facility] Ad[facility] (Discharged) [facility]  C-SSRS RISK CATEGORY No Risk No Risk High Risk    Last PHQ 2/9 Scores:    02/18/2023   10:32 AM 02/09/2023   11:35 AM 07/30/2021  2:27 PM  Depression screen PHQ 2/9  Decreased Interest 3 3 0  Down, Depressed, Hopeless 3 3 0  PHQ - 2 Score 6 6 0  Altered sleeping 2 3 0  Tired, decreased energy 0  0  Change in appetite 2 3 0  Feeling bad or failure about yourself  3 3 0  Trouble concentrating 2 3 0  Moving slowly or fidgety/restless 1 3 0  Suicidal thoughts 0 3 0  PHQ-9 Score 16 24 0  Difficult doing work/chores Very difficult  Not difficult at all    Scribe for Treatment Team: Oliva DELENA Salmon, DO 01/02/2024 5:13 PM

## 2024-01-02 NOTE — Progress Notes (Signed)
(  Sleep Hours) - 6.5 hours (Any PRNs that were needed, meds refused, or side effects to meds)-  Trazodone , Vistaril  given (Any disturbances and when (visitation, over night)- None (Concerns raised by the patient)-  None (SI/HI/AVH)-  passive SI, contracts

## 2024-01-02 NOTE — Group Note (Signed)
 Date:  01/02/2024 Time:  4:59 PM  Group Topic/Focus: Occupational Therapy   Pt did not attend occupational therapy group   Grace Montgomery R Georg Ang 01/02/2024, 4:59 PM
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
## 2024-01-02 NOTE — Group Note (Signed)
 Date:  01/02/2024 Time:  12:13 PM  Group Topic/Focus:  Physical Wellness: The group likely aims to help participants improve their cardiovascular health, flexibility, strength, and endurance. Encouraging regular exercise tailored to different fitness levels and goals. Emotional Education:   The focus of this group is to discuss what feelings/emotions are, and how they are experienced. Pt's wrote a letter to their future self. This activity focused on: Self-Reflection: The group may want to reflect on their current thoughts, hopes, dreams, and challenges. Writing to their future selves allows them to pause and consider where they are in life and what they want to achieve. Goal Setting: A letter to the future can be a way to articulate personal or group goals. It helps individuals visualize where they hope to be and what they want to have accomplished by a certain time. Motivation: Revisiting a letter written to their future selves can serve as a reminder of the reasons behind their goals, and it can inspire them to stay on track or push through difficult times. Growth Tracking: A letter serves as a time capsule, allowing the group to measure how far they've come in terms of personal or collective growth. It can be a way to compare past expectations with present realities. Connection and Unity: If the group is doing this activity together, it can foster a sense of connection and shared purpose. Writing to the future encourages a sense of solidarity as everyone collectively reflects on their future paths. Coping with Change: For some, the letter may be a tool for dealing with uncertainty or major life transitions. It's a way to anchor themselves in their current state of mind and emotions, so they can see how they've adapted over time.    Participation Level:  Did Not Attend   Grace Montgomery 01/02/2024, 12:13 PM

## 2024-01-02 NOTE — Group Note (Signed)
 Date:  01/02/2024 Time:  9:53 AM  Group Topic/Focus:  Goals Group:   The focus of this group is to help patients establish daily goals to achieve during treatment and discuss how the patient can incorporate goal setting into their daily lives to aide in recovery. Orientation:   The focus of this group is to educate the patient on the purpose and policies of crisis stabilization and provide a format to answer questions about their admission.  The group details unit policies and expectations of patients while admitted.    Participation Level:  Did Not Attend   Grace Montgomery 01/02/2024, 9:53 AM

## 2024-01-03 LAB — GLUCOSE, CAPILLARY
Glucose-Capillary: 250 mg/dL — ABNORMAL HIGH (ref 70–99)
Glucose-Capillary: 346 mg/dL — ABNORMAL HIGH (ref 70–99)
Glucose-Capillary: 202 mg/dL — ABNORMAL HIGH (ref 70–99)
Glucose-Capillary: 447 mg/dL — ABNORMAL HIGH (ref 70–99)
Glucose-Capillary: 317 mg/dL — ABNORMAL HIGH (ref 70–99)

## 2024-01-03 MED ORDER — INSULIN ASPART 100 UNIT/ML IJ SOLN
5.0000 [IU] | Freq: Once | INTRAMUSCULAR | Status: AC
Start: 2024-01-03 — End: 2024-01-03
  Administered 2024-01-03: 5 [IU] via SUBCUTANEOUS

## 2024-01-03 MED ORDER — POLYETHYLENE GLYCOL 3350 17 G PO PACK
17.0000 g | PACK | Freq: Once | ORAL | Status: DC
  Filled 2024-01-03: qty 1

## 2024-01-03 MED ORDER — INSULIN ASPART 100 UNIT/ML IJ SOLN
0.0000 [IU] | Freq: Three times a day (TID) | INTRAMUSCULAR | Status: DC
  Administered 2024-01-03: 7 [IU] via SUBCUTANEOUS
  Administered 2024-01-04 (×2): 15 [IU] via SUBCUTANEOUS
  Administered 2024-01-05: 4 [IU] via SUBCUTANEOUS
  Administered 2024-01-05: 20 [IU] via SUBCUTANEOUS
  Administered 2024-01-05: 3 [IU] via SUBCUTANEOUS
  Administered 2024-01-06: 7 [IU] via SUBCUTANEOUS
  Administered 2024-01-06: 20 [IU] via SUBCUTANEOUS
  Administered 2024-01-06 – 2024-01-07 (×2): 7 [IU] via SUBCUTANEOUS

## 2024-01-03 MED ORDER — POLYETHYLENE GLYCOL 3350 17 G PO PACK: 17.0000 g | PACK | Freq: Once | ORAL | Status: DC
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
## 2024-01-03 NOTE — Inpatient Diabetes Management (Signed)
 Inpatient Diabetes Program Recommendations  AACE/ADA: New Consensus Statement on Inpatient Glycemic Control (2015)  Target Ranges:  Prepandial:   less than 140 mg/dL      Peak postprandial:   less than 180 mg/dL (1-2 hours)      Critically ill patients:  140 - 180 mg/dL    Latest Reference Range & Units 01/02/24 05:53 01/02/24 16:31 01/02/24 21:02 01/02/24 23:20  Glucose-Capillary 70 - 99 mg/dL 754 (H)  5 units Novolog   Refused Novolog  at 12pm  257 (H)  5 units Novolog   449 (H)  10 units Novolog   369 (H)  (H): Data is abnormally high  Latest Reference Range & Units 01/03/24 05:59  Glucose-Capillary 70 - 99 mg/dL 749 (H)  5 units Novolog    (H): Data is abnormally high   History: DM2  Home DM Meds: Rybelsus 3 mg daily       Metformin  1000 mg BID  Current Orders: Rybelsus 3 mg daily      Metformin  1000 mg BID      Novolog  Moderate Correction Scale/ SSI (0-15 units) TID AC + HS    MD- Note Hyperglycemia  Please consider the following while pt is inpatient:  1. Start basal insulin : Semglee  10 units daily (~0.1 units/kg)  2. Start Novolog  Meal Coverage: Novolog  4 units TID with meals HOLD if pt NPO HOLD if pt eats <50% meals     --Will follow patient during hospitalization--  Adina Rudolpho Arrow RN, MSN, CDCES Diabetes Coordinator Inpatient Glycemic Control Team Team Pager: [phone] (8a-5p)

## 2024-01-03 NOTE — [facility] Group Notes (Signed)
 The focus of this group is to help patients review their daily goal of treatment and discuss progress on daily workbooks. Pt was attentive and appropriate during tonight's wrap up group discussion. Pt shared that overall day was good. Was able to sleep and talk with case worker about concerns.

## 2024-01-03 NOTE — Group Note (Signed)
 LCSW Group Therapy Note   Group Date: 01/03/2024 Start Time: 1100 End Time: 1200   Participation:  did not attend  Type of Therapy:  Group Therapy  Topic: Lifestyle:  from "One Day" to "Today is Day One"  Objective:  To promote mental and physical well-being through lifestyle changes in routine, nutrition, sleep, and movement.  Goals: Increase awareness of how lifestyle habits impact mental health. Encourage one small, achievable wellness goal. Support group sharing and accountability.  Summary:  Group members explored how daily habits influence mental health and discussed the importance of starting with small, manageable changes. Participants identified personal goals and shared reflections on improving structure, sleep, diet, and physical activity.  Therapeutic Modalities: CBT - Identifying and challenging all-or-nothing thinking; promoting realistic, helpful thoughts about change. Psychoeducation - Teaching about the impact of sleep, nutrition, movement, and routine on mental health. Motivational Interviewing - Eliciting personal motivation and exploring readiness for change. Goal-Setting - Supporting SMART goals to build self-efficacy and encourage follow-through.   Grace Montgomery O Gerhardt Gleed, LCSWA 01/03/2024  12:14 PM

## 2024-01-03 NOTE — Group Note (Signed)
 Date:  01/03/2024 Time:  1:51 PM  Group Topic/Focus:   Crisis Planning:   The purpose of this group is to help patients create a crisis plan for use upon discharge or in the future, as needed. More specifically, setting SMART goals and developing a WRAP plan.     Participation Level:  Did Not Attend   Grace Montgomery Mars 01/03/2024, 1:51 PM

## 2024-01-03 NOTE — Group Note (Signed)
 Date:  01/03/2024 Time:  3:50 PM  Group Topic/Focus: Sleep Hygiene Education Emotional Education:   The focus of this group is to discuss what feelings/emotions are, and how they are experienced.    Participation Level:  Did Not Attend    Grace Montgomery 01/03/2024, 3:50 PM

## 2024-01-03 NOTE — Group Note (Signed)
 Date:  01/03/2024 Time:  10:15 AM  Group Topic/Focus:  Goals Group:   The focus of this group is to help patients establish daily goals to achieve during treatment and discuss how the patient can incorporate goal setting into their daily lives to aide in recovery. Orientation:   The focus of this group is to educate the patient on the purpose and policies of crisis stabilization and provide a format to answer questions about their admission.  The group details unit policies and expectations of patients while admitted.    Participation Level:  Did Not Attend   Grace Montgomery 01/03/2024, 10:15 AM

## 2024-01-03 NOTE — Plan of Care (Signed)
   Problem: Education: Goal: Knowledge of [facility] Education information/materials will improve Outcome: Progressing Goal: Emotional status will improve Outcome: Progressing Goal: Mental status will improve Outcome: Progressing Goal: Verbalization of understanding the information provided will improve Outcome: Progressing

## 2024-01-03 NOTE — Group Note (Signed)
 Recreation Therapy Group Note   Group Topic:Animal Assisted Therapy   Group Date: 01/03/2024 Start Time: 0946 End Time: 1030 Facilitators: Yonathan Perrow-McCall, LRT,CTRS Location: [address] Dayroom   Animal-Assisted Activity (AAA) Program Checklist/Progress Notes Patient Eligibility Criteria Checklist & Daily Group note for Rec Tx Intervention  AAA/T Program Assumption of Risk Form signed by Patient/ or Parent Legal Guardian Yes  Patient is free of allergies or severe asthma Yes  Patient reports no fear of animals Yes  Patient reports no history of cruelty to animals Yes  Patient understands his/her participation is voluntary Yes  Patient washes hands before animal contact Yes  Patient washes hands after animal contact Yes  Behavioral Response:    Education: Hand Washing, Appropriate Animal Interaction   Education Outcome: Acknowledges education.    Affect/Mood: N/A   Participation Level: Did not attend    Clinical Observations/Individualized Feedback:      Plan: Continue to engage patient in RT group sessions 2-3x/week.   Grace Montgomery, LRT,CTRS 01/03/2024 11:50 AM

## 2024-01-03 NOTE — Group Note (Signed)
 Date:  01/03/2024 Time:  11:13 AM  Group Topic/Focus:  Pet Therapy:   This group aims to reduce stress and anxiety by using trained animals as a therapeutic tool.    Participation Level:  Did Not Attend   [name] 01/03/2024, 11:13 AM

## 2024-01-03 NOTE — Progress Notes (Signed)
   01/03/24 0846  Psych Admission Type (Psych Patients Only)  Admission Status Voluntary  Psychosocial Assessment  Patient Complaints Anxiety;Depression  Eye Contact Fair  Facial Expression Anxious  Affect Appropriate to circumstance  Speech Logical/coherent  Interaction Arrogant  Motor Activity Other (Comment) (wnl)  Appearance/Hygiene Unremarkable  Behavior Characteristics Appropriate to situation;Cooperative  Mood Anxious;Irritable  Thought Process  Coherency WDL  Content Blaming others  Delusions None reported or observed  Perception WDL  Hallucination None reported or observed  Judgment Poor  Confusion None  Danger to Self  Current suicidal ideation? Denies  Agreement Not to Harm Self Yes  Description of Agreement Verbal  Danger to Others  Danger to Others None reported or observed

## 2024-01-03 NOTE — Plan of Care (Signed)
  Problem: Activity: Goal: Interest or engagement in activities will improve Outcome: Progressing Goal: Sleeping patterns will improve Outcome: Progressing   Problem: Coping: Goal: Ability to verbalize frustrations and anger appropriately will improve Outcome: Progressing Goal: Ability to demonstrate self-control will improve Outcome: Progressing   Problem: Safety: Goal: Periods of time without injury will increase Outcome: Progressing

## 2024-01-03 NOTE — [facility] Suicide Risk Assessment (Signed)
 [facility] INPATIENT:  Family/Significant Other Suicide Prevention Education  Suicide Prevention Education:  Patient Refusal for Family/Significant Other Suicide Prevention Education: The patient Grace Montgomery has refused to provide written consent for family/significant other to be provided Family/Significant Other Suicide Prevention Education during admission and/or prior to discharge.  Physician notified.  Suicide Prevention Education was reviewed thoroughly with patient, including risk factors, warning signs, and what to do. Mobile Crisis services were described and that telephone number pointed out, with encouragement to patient to put this number in personal cell phone. Brochure was provided to patient to share with natural supports. Patient acknowledged the ways in which they are at risk, and how working through each of their issues can gradually start to reduce their risk factors. Patient was encouraged to think of the information in the context of people in their own lives. Patient denied having access to firearms Patient verbalized understanding of information provided. Patient endorsed a desire to live.    Louetta Lame 01/03/2024, 2:40 PM

## 2024-01-04 LAB — GLUCOSE, CAPILLARY
Glucose-Capillary: 246 mg/dL — ABNORMAL HIGH (ref 70–99)
Glucose-Capillary: 283 mg/dL — ABNORMAL HIGH (ref 70–99)
Glucose-Capillary: 303 mg/dL — ABNORMAL HIGH (ref 70–99)
Glucose-Capillary: 331 mg/dL — ABNORMAL HIGH (ref 70–99)
Glucose-Capillary: 331 mg/dL — ABNORMAL HIGH (ref 70–99)

## 2024-01-04 MED ORDER — ONDANSETRON 4 MG PO TBDP
8.0000 mg | ORAL_TABLET | Freq: Two times a day (BID) | ORAL | Status: DC | PRN
Start: 2024-01-04 — End: 2024-01-07

## 2024-01-04 MED ORDER — SENNOSIDES-DOCUSATE SODIUM 8.6-50 MG PO TABS: 2.0000 | ORAL_TABLET | Freq: Once | ORAL | Status: DC

## 2024-01-04 NOTE — Progress Notes (Signed)
 Patient denies SI, AH, VH. Patient stated they slept Okay last night. Scored zero on anxiety and 7/10 on depression. Patient was overall calm, cooperative, and med compliant.    Mercie VEAR Banana, RN    01/04/24 1100  Psych Admission Type (Psych Patients Only)  Admission Status Voluntary  Psychosocial Assessment  Patient Complaints Depression  Eye Contact Fair  Facial Expression Flat  Affect Appropriate to circumstance  Speech Logical/coherent  Interaction Assertive  Motor Activity Other (Comment) (WDL)  Appearance/Hygiene Unremarkable  Behavior Characteristics Cooperative;Appropriate to situation  Mood Depressed  Thought Process  Coherency WDL  Content WDL  Delusions None reported or observed  Perception WDL  Hallucination None reported or observed  Judgment WDL  Confusion None  Danger to Self  Current suicidal ideation? Denies  Agreement Not to Harm Self Yes  Description of Agreement Verbal  Danger to Others  Danger to Others None reported or observed

## 2024-01-04 NOTE — Group Note (Signed)
 Date:  01/04/2024 Time:  11:31 AM  Group Topic/Focus:  Developing a Wellness Toolbox:   The focus of this group is to help patients develop a wellness toolbox with skills and strategies to promote recovery upon discharge. Spiritual Wellness assessment orientation    Participation Level:  Did Not Attend    Dolores CHRISTELLA Fredericks 01/04/2024, 11:31 AM

## 2024-01-04 NOTE — Progress Notes (Signed)
 Spiritual care group facilitated by Chaplain Dyanne Carrel, [facility]  Group focused on topic of strength. Group members reflected on what thoughts and feelings emerge when they hear this topic. They then engaged in facilitated dialog around how strength is present in their lives. This dialog focused on representing what strength had been to them in their lives (images and patterns given) and what they saw as helpful in their life now (what they needed / wanted).  Activity drew on narrative framework.  Patient Progress:

## 2024-01-04 NOTE — Group Note (Signed)
 Date:  01/04/2024 Time:  4:22 PM  Group Topic/Focus:  Dimensions of Wellness:   The focus of this group is to introduce the topic of wellness and discuss the role each dimension of wellness plays in total health.    Participation Level:  Did Not Attend  Annalee Larch 01/04/2024, 4:22 PM

## 2024-01-04 NOTE — Inpatient Diabetes Management (Signed)
 Inpatient Diabetes Program Recommendations  AACE/ADA: New Consensus Statement on Inpatient Glycemic Control (2015)  Target Ranges:  Prepandial:   less than 140 mg/dL      Peak postprandial:   less than 180 mg/dL (1-2 hours)      Critically ill patients:  140 - 180 mg/dL   Lab Results  Component Value Date   GLUCAP 283 (H) 01/04/2024   HGBA1C 9.0 (H) 12/29/2023    Review of Glycemic Control  Latest Reference Range & Units 01/03/24 05:59 01/03/24 11:56 01/03/24 17:08 01/03/24 20:55 01/03/24 22:02 01/04/24 05:55 01/04/24 09:47  Glucose-Capillary 70 - 99 mg/dL 749 (H) 653 (H) 797 (H) 447 (H) 317 (H) 246 (H) 283 (H)  (H): Data is abnormally high History: DM2   Home DM Meds: Rybelsus 3 mg daily                             Metformin  1000 mg BID   Current Orders: Rybelsus 3 mg daily                            Metformin  1000 mg BID                            Novolog  Moderate Correction Scale/ SSI (0-15 units) TID AC + HS       MD- Note Hyperglycemia   Consider the following while pt is inpatient:   1. Start basal insulin : Semglee  10 units daily (~0.1 units/kg)   2. Start Novolog  Meal Coverage: Novolog  4 units TID with meals HOLD if pt NPO HOLD if pt eats <50% meals  Secure chat sent to MD.  Thanks, Tinnie Minus, MSN, RNC-OB Diabetes Coordinator [phone] (8a-5p)

## 2024-01-04 NOTE — Progress Notes (Addendum)
 EVS approached writer reporting that patient was found projectile vomitting in room. Upon approach, pt reports that she woke up out of her sleep throwing up. Pt reports that she ate breakfast and is not sure if it is something she ate or if it is indigestion. Vomit observed all over the floor in room. CBG obtained, result was 283. Pt's room cleaned, patient showered, and bedding changed. PRN Mylanta administered for indigestion.

## 2024-01-04 NOTE — Plan of Care (Signed)
   Problem: Education: Goal: Knowledge of [facility] Education information/materials will improve Outcome: Progressing Goal: Emotional status will improve Outcome: Progressing Goal: Mental status will improve Outcome: Progressing Goal: Verbalization of understanding the information provided will improve Outcome: Progressing

## 2024-01-04 NOTE — Plan of Care (Signed)
   Problem: Education: Goal: Knowledge of [facility] [facility] Education information/materials will improve Outcome: Progressing Goal: Emotional status will improve Outcome: Progressing Goal: Mental status will improve Outcome: Progressing
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
## 2024-01-04 NOTE — Group Note (Signed)
 Date:  01/04/2024 Time:  11:43 AM  Group Topic/Focus:  Recreational Therapy- Patients are using their mind to solve problems.    Participation Level:  Did Not Attend  Grace Montgomery 01/04/2024, 11:43 AM

## 2024-01-04 NOTE — Progress Notes (Signed)
(  Sleep Hours) - (Any PRNs that were needed, meds refused, or side effects to meds)- prn hydroxyzine  and trazodone  @ 2119 (Any disturbances and when (visitation, over night)-none (Concerns raised by the patient)- none (SI/HI/AVH)- denies all

## 2024-01-04 NOTE — Group Note (Signed)
 Date:  01/04/2024 Time:  1:38 PM  Group Topic/Focus:  Emotional Education:   The focus of this group is to discuss what feelings/emotions are, and how they are experienced.  Chaplin Group when patients attend and talk about the topic strength.  Participation Level:  Did Not Attend   Grace Montgomery 01/04/2024, 1:38 PM

## 2024-01-04 NOTE — Progress Notes (Signed)
 CONTACT NOTE:   Per patient request, CSW provided resources for patient. Patient received shelter, [address], and substance use tx list.   SIGNED: Louetta Lame, LCSW-A

## 2024-01-04 NOTE — Group Note (Signed)
 Recreation Therapy Group Note   Group Topic:Problem Solving  Group Date: 01/04/2024 Start Time: 0945 End Time: 1005 Facilitators: Saragrace Selke-McCall, LRT,CTRS Location: [address] Dayroom   Group Topic: Problem Solving  Goal Area(s) Addresses:  Patient will effectively work in a team with other group members. Patient will verbalize importance of using appropriate problem solving techniques.  Patient will identify positive change associated with effective problem solving skills.   Behavioral Response:   Intervention: Worksheet  Activity: Dentist. Patients were given a sheet front and back of brain teasers. Patients worked together (or alone) to figure out each puzzle presented.     Education: Problem solving as an important skill in daily life  Education Outcome: Acknowledges understanding/In group clarification offered/Needs additional education.    Affect/Mood: N/A   Participation Level: Did not attend    Clinical Observations/Individualized Feedback:      Plan: Continue to engage patient in RT group sessions 2-3x/week.   Dymond Gutt-McCall, LRT,CTRS 01/04/2024 12:44 PM

## 2024-01-04 NOTE — Group Note (Signed)
 Date:  01/04/2024 Time:  1:07 PM  Group Topic/Focus:     Pharmacology explaining to patients about the different Kind of patients    Participation Level:  Active  Participation Quality:  Appropriate  Affect:  Appropriate  Cognitive:  Alert and Appropriate  Insight: Appropriate and Good  Engagement in Group:  Engaged  Modes of Intervention:  Orientation    Grace Montgomery 01/04/2024, 1:07 PM

## 2024-01-05 LAB — GLUCOSE, CAPILLARY
Glucose-Capillary: 226 mg/dL — ABNORMAL HIGH (ref 70–99)
Glucose-Capillary: 439 mg/dL — ABNORMAL HIGH (ref 70–99)
Glucose-Capillary: 183 mg/dL — ABNORMAL HIGH (ref 70–99)
Glucose-Capillary: 237 mg/dL — ABNORMAL HIGH (ref 70–99)

## 2024-01-05 NOTE — Group Note (Signed)
 LCSW Group Therapy Note   Group Date: 01/05/2024 Start Time: 1100 End Time: 1200   Participation:  did not attend  Type of Therapy:  Group Therapy  Topic:  Stress Less:  Nurturing Your Mind and Body Through Calm   Objective:  Learn techniques for managing stress through body relaxation, mindfulness, and self-compassion.  Goals: Use body relaxation techniques, such as Box Breathing and Progressive Muscle Relaxation, to reduce physical tension. Practice mindfulness to break the cycle of overthinking and mental chatter. Embrace self-compassion to handle stress with kindness and resilience.  Summary:  Today's session focused on calming the body with relaxation techniques, breaking the cycle of stress with mindfulness, and using self-compassion to manage challenges more gracefully. These tools help reduce stress and foster a balanced, peaceful mindset.  Therapeutic Modalities used:  Elements of CBT ( cognitive restructuring)  Elements of DBT (box breathing, progressive body relaxation, mindfulness, acceptance)    Grace Montgomery O Grace Montgomery, LCSWA 01/05/2024  12:15 PM

## 2024-01-05 NOTE — Plan of Care (Signed)
  Problem: Education: Goal: Knowledge of [place] [facility] Education information/materials will improve Outcome: Progressing Goal: Emotional status will improve Outcome: Progressing Goal: Mental status will improve Outcome: Progressing Goal: Verbalization of understanding the information provided will improve Outcome: Progressing   Problem: Activity: Goal: Interest or engagement in activities will improve Outcome: Not Progressing

## 2024-01-05 NOTE — [facility] Group Notes (Signed)
 Adult Psychoeducational Group Note  Date:  01/05/2024 Time:  4:08 AM  Group Topic/Focus:  Wrap-Up Group:   The focus of this group is to help patients review their daily goal of treatment and discuss progress on daily workbooks.  Participation Level:  Active  Participation Quality:  Appropriate  Affect:  Appropriate  Cognitive:  Appropriate  Insight: Appropriate  Engagement in Group:  Engaged  Modes of Intervention:  Discussion  Additional Comments:  Ferrell said her day was a 8. Goal for today talk to child psychotherapist. Coping skills walking sleeping favorite part of the day sleeping something she like about her self personality continue recovery when discharge therapist.  Lang Donia Law 01/05/2024, 4:08 AM

## 2024-01-05 NOTE — Progress Notes (Signed)
   01/05/24 1400  Psych Admission Type (Psych Patients Only)  Admission Status Voluntary  Psychosocial Assessment  Patient Complaints None  Eye Contact Fair  Facial Expression Animated  Affect Appropriate to circumstance  Speech Logical/coherent  Interaction Assertive  Motor Activity Slow  Appearance/Hygiene Unremarkable  Behavior Characteristics Cooperative  Mood Pleasant  Thought Process  Coherency WDL  Content WDL  Delusions None reported or observed  Perception WDL  Hallucination None reported or observed  Judgment WDL  Confusion None  Danger to Self  Current suicidal ideation? Denies  Description of Suicide Plan No Plan  Agreement Not to Harm Self Yes  Description of Agreement Verbal  Danger to Others  Danger to Others None reported or observed
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
## 2024-01-05 NOTE — Group Note (Signed)
 Date:  01/05/2024 Time:  1:44 PM  Group Topic/Focus:  Emotional Education:   The focus of this group is to discuss what feelings/emotions are, and how they are experienced.    Participation Level:  Active  Participation Quality:  Appropriate  Affect:  Appropriate  Cognitive:  Appropriate  Insight: Appropriate  Engagement in Group:  Engaged  Modes of Intervention:  Discussion  Additional Comments:  N/A  Grace Montgomery A Amere Iott 01/05/2024, 1:44 PM

## 2024-01-05 NOTE — Group Note (Signed)
 Date:  01/05/2024 Time:  9:25 AM  Group Topic/Focus:  Goals Group:   The focus of this group is to help patients establish daily goals to achieve during treatment and discuss how the patient can incorporate goal setting into their daily lives to aide in recovery.    Participation Level:  Did Not Attend  Participation Quality:  Did Not Attend  Affect:  Did Not Attend  Cognitive:  Did Not Attend  Insight: None  Engagement in Group:  Did Not Attend  Modes of Intervention:  Did Not Attend  Additional Comments:  Did Not Attend  Jermaine Neuharth A Henry Utsey 01/05/2024, 9:25 AM

## 2024-01-05 NOTE — Progress Notes (Signed)
 Pt refused to get up and take her medication this morning. She gets up at lunch time and demands her morning meds. She lets us  take her BG and it is 432. Will notify MD.

## 2024-01-05 NOTE — Progress Notes (Signed)
(  Sleep Hours) -6.0 as of 0530 (Any PRNs that were needed, meds refused, or side effects to meds)- prn hydroxyzine  and trazodone  @2200  (Any disturbances and when (visitation, over night)-none (Concerns raised by the patient)- none (SI/HI/AVH)- denies all

## 2024-01-05 NOTE — Group Note (Signed)
 Date:  01/05/2024 Time:  9:32 PM  Group Topic/Focus:  Wrap-Up Group:   The focus of this group is to help patients review their daily goal of treatment and discuss progress on daily workbooks.    Participation Level:  Active  Participation Quality:  Appropriate  Affect:  Appropriate  Cognitive:  Appropriate  Insight: Appropriate  Engagement in Group:  Engaged  Modes of Intervention:  Discussion  Additional Comments:  Patient stated that her day was a 10.  Her goal is to work on her dischare on Saturday.    Bari Moats 01/05/2024, 9:32 PM

## 2024-01-06 ENCOUNTER — Encounter (HOSPITAL_COMMUNITY): Payer: Self-pay

## 2024-01-06 DIAGNOSIS — E119 Type 2 diabetes mellitus without complications: Secondary | ICD-10-CM

## 2024-01-06 LAB — GLUCOSE, CAPILLARY
Glucose-Capillary: 222 mg/dL — ABNORMAL HIGH (ref 70–99)
Glucose-Capillary: 362 mg/dL — ABNORMAL HIGH (ref 70–99)
Glucose-Capillary: 222 mg/dL — ABNORMAL HIGH (ref 70–99)
Glucose-Capillary: 198 mg/dL — ABNORMAL HIGH (ref 70–99)

## 2024-01-06 MED ORDER — FLUCONAZOLE 50 MG PO TABS
150.0000 mg | ORAL_TABLET | Freq: Once | ORAL | Status: AC
  Administered 2024-01-06: 150 mg via ORAL
  Filled 2024-01-06 (×2): qty 3

## 2024-01-06 NOTE — Plan of Care (Signed)
   Problem: Education: Goal: Emotional status will improve Outcome: Progressing Goal: Mental status will improve Outcome: Progressing   Problem: Activity: Goal: Interest or engagement in activities will improve Outcome: Progressing

## 2024-01-06 NOTE — Progress Notes (Signed)
(  Sleep Hours) - 6.5  (Any PRNs that were needed, meds refused, or side effects to meds)- PRN Hydroxyzine  & Trazodone   (Any disturbances and when (visitation, over night)-none  (Concerns raised by the patient)- Anxious  (SI/HI/AVH)- denies

## 2024-01-06 NOTE — Group Note (Signed)
 Date:  01/06/2024 Time:  12:45 PM  Group Topic/Focus:  Wellness Toolbox:   The focus of this group is to discuss various aspects of wellness, balancing those aspects and exploring ways to increase the ability to experience wellness.  Patients will create a wellness toolbox for use upon discharge.    Participation Level:  Did Not Attend  Participation Quality:  Did Not Attend  Affect:  Did Not Attend  Cognitive:  Did Not Attend  Insight: None  Engagement in Group:  Did Not Attend  Modes of Intervention:  Did Not Attend  Additional Comments:  Did Not Attend  Avelina DELENA Humphreys 01/06/2024, 12:45 PM

## 2024-01-06 NOTE — Group Note (Signed)
 Date:  01/06/2024 Time:  10:27 AM  Group Topic/Focus:  Goals Group:   The focus of this group is to help patients establish daily goals to achieve during treatment and discuss how the patient can incorporate goal setting into their daily lives to aide in recovery.    Participation Level:  Did Not Attend  Participation Quality:  Did Not Attend  Affect:  Did Not Attend  Cognitive:  Did Not Attend  Insight: None  Engagement in Group:  Did Not Attend  Modes of Intervention:  Did Not Attend  Additional Comments:  Did Not Attend  Grace Montgomery 01/06/2024, 10:27 AM

## 2024-01-06 NOTE — Group Note (Signed)
 Date:  01/06/2024 Time:  6:54 PM  Group Topic/Focus: 5 love languages Identifying Needs:   The focus of this group is to help patients identify their personal needs that have been historically problematic and identify healthy behaviors to address their needs.    Participation Level:  Active  Participation Quality:  Appropriate  Affect:  Appropriate  Cognitive:  Appropriate  Insight: Appropriate  Engagement in Group:  Engaged  Modes of Intervention:  Discussion and Education  Additional Comments:    Juliene CHRISTELLA Huddle 01/06/2024, 6:54 PM

## 2024-01-06 NOTE — Group Note (Unsigned)
 Date:  01/06/2024 Time:  9:24 PM  Group Topic/Focus:  Relapse Prevention Planning:   The focus of this group is to define relapse and discuss the need for planning to combat relapse. Wrap-Up Group:   The focus of this group is to help patients review their daily goal of treatment and discuss progress on daily workbooks.     Participation Level:  {BHH PARTICIPATION OZCZO:77735}  Participation Quality:  {BHH PARTICIPATION QUALITY:22265}  Affect:  {BHH AFFECT:22266}  Cognitive:  {BHH COGNITIVE:22267}  Insight: {BHH Insight2:20797}  Engagement in Group:  {BHH ENGAGEMENT IN HMNLE:77731}  Modes of Intervention:  {BHH MODES OF INTERVENTION:22269}  Additional Comments:  ***  Grace Montgomery 01/06/2024, 9:24 PM

## 2024-01-06 NOTE — Progress Notes (Signed)
   01/06/24 1400  Psych Admission Type (Psych Patients Only)  Admission Status Voluntary  Psychosocial Assessment  Patient Complaints Worrying  Eye Contact Fair  Facial Expression Animated  Affect Appropriate to circumstance  Speech Logical/coherent  Interaction Assertive  Motor Activity Slow  Appearance/Hygiene Unremarkable  Behavior Characteristics Cooperative;Calm  Mood Pleasant  Thought Process  Coherency WDL  Content WDL  Delusions None reported or observed  Perception WDL  Hallucination None reported or observed  Judgment WDL  Confusion None  Danger to Self  Current suicidal ideation? Denies  Description of Suicide Plan No Plan  Agreement Not to Harm Self Yes  Description of Agreement Verbal  Danger to Others  Danger to Others None reported or observed

## 2024-01-06 NOTE — Progress Notes (Signed)
(  Sleep Hours) -6.25  (Any PRNs that were needed, meds refused, or side effects to meds)-hydroxyzine  25mg , Trazodone  150mg    (Any disturbances and when (visitation, over night)-none  (Concerns raised by the patient)- States has vaginal itching and usually gets yeast infection when blood sugars elevated. Would like medication that can be taken po, states does not like cream (patient has allergy to Monistat)  (SI/HI/AVH)-denies

## 2024-01-06 NOTE — Progress Notes (Signed)
 [facility] Inpatient Psychiatry Progress Note  Date: 01/06/24 Patient: Grace Montgomery MRN: [id number]  Assessment and Plan: KEM PARCHER is a 33 y.o., female with a past psychiatric history significant for MDD, stimulant use disorder cocaine type, ecstasy use, and alcohol use disorder who presented to the [facility] Voluntary from [facility] Emergency Department for evaluation and management of SI with a plan to jump off her balcony on the porch. Of note she also took 2 trazodone  and 3 Vistaril  tablets prior to admission with intent to harm herself.   10/31 - Patient appears significantly improved. She is requesting to be discharged tomorrow with plans on finding lodging at a local shelter. She stated during assessment that she did not intent to take insulin  after discharge, as she did not PTA.   # MDD (major depressive disorder), recurrent episode, severe (HCC) - sertraline  50 mg daily  # Stimulant Use Disorder / Cannabis Use Disorder / Alcohol Use Disorder, severe - Patient declines residential treatment or naltrexone. She does not appear to be interested in SUD treatment at this time.  # DM2 - Sliding scale insulin  - Semaglutide 3 mg PO daily - Metformin  1000 mg BID w/ meals  Risk Assessment - Low  Discharge Planning Estimated length of stay: 3-4 days Predicted Discharge location: IRC/shelter     Interval History and update: Chart reviewed. No significant events overnight. Glucose levels has been in lower 200s today and yesterday evening. No behavioral issues. She has been compliant with her medications. Yesterday she changed her mind about going to [facility] and expressed a desire to enroll with the ACT team, however, she may not have a qualifying diagnosis for enrollment and this does not resolve her lack of discharge destination. On assessment today she reported that whether or not she would be accepted by the ACT team she would like to  discharge tomorrow and that she was feeling generally better. She denied suicidal ideation. She has been taking sertraline  and denies any significant side effects. She reported that her plan is to reach out to local shelters and she is considering donating plasma to obtain some money so she could stay in a hotel if needed.       Physical Exam MSK/Neuro - Normal gait and station  Mental Status Exam Appearance - Wearing hospital gown, appropriate hygiene and grooming  Attitude - Calm, polite, not guarded Speech - normal volume, prosody, inflection Mood - Okay Affect - Restricted Thought Process - LLGD Thought Content - No delusional TC expressed SI/HI - Denies  Perceptions - Denies AVH; not RIS Judgement/Insight - Fair Fund of knowledge - WNL Language - No impairments      Lab Results:  Admission on 12/30/2023  Component Date Value Ref Range Status   Glucose-Capillary 12/30/2023 234 (H)  70 - 99 mg/dL Final   Glucose-Capillary 12/31/2023 282 (H)  70 - 99 mg/dL Final   Comment 1 89/74/7974 Notify RN   Final   Glucose-Capillary 12/31/2023 398 (H)  70 - 99 mg/dL Final   Glucose-Capillary 12/31/2023 268 (H)  70 - 99 mg/dL Final   Glucose-Capillary 12/31/2023 325 (H)  70 - 99 mg/dL Final   Comment 1 89/74/7974 Notify RN   Final   Comment 2 12/31/2023 Document in Chart   Final   Glucose-Capillary 01/01/2024 257 (H)  70 - 99 mg/dL Final   Comment 1 89/73/7974 Notify RN   Final   Comment 2 01/01/2024 Document in Chart   Final  Glucose-Capillary 01/01/2024 249 (H)  70 - 99 mg/dL Final   Glucose-Capillary 01/01/2024 276 (H)  70 - 99 mg/dL Final   Glucose-Capillary 01/01/2024 333 (H)  70 - 99 mg/dL Final   Glucose-Capillary 01/01/2024 277 (H)  70 - 99 mg/dL Final   Comment 1 89/73/7974 Notify RN   Final   Comment 2 01/01/2024 Document in Chart   Final   Glucose-Capillary 01/02/2024 245 (H)  70 - 99 mg/dL Final   Comment 1 89/72/7974 Notify RN   Final   Comment 2 01/02/2024 Document  in Chart   Final   Glucose-Capillary 01/02/2024 257 (H)  70 - 99 mg/dL Final   Glucose-Capillary 01/02/2024 449 (H)  70 - 99 mg/dL Final   Glucose-Capillary 01/02/2024 369 (H)  70 - 99 mg/dL Final   Comment 1 89/72/7974 Notify RN   Final   Glucose-Capillary 01/03/2024 250 (H)  70 - 99 mg/dL Final   Comment 1 89/71/7974 Notify RN   Final   Glucose-Capillary 01/03/2024 346 (H)  70 - 99 mg/dL Final   Glucose-Capillary 01/03/2024 202 (H)  70 - 99 mg/dL Final   Glucose-Capillary 01/03/2024 447 (H)  70 - 99 mg/dL Final   Comment 1 89/71/7974 Notify RN   Final   Glucose-Capillary 01/03/2024 317 (H)  70 - 99 mg/dL Final   Comment 1 89/71/7974 Notify RN   Final   Glucose-Capillary 01/04/2024 246 (H)  70 - 99 mg/dL Final   Comment 1 89/70/7974 Notify RN   Final   Glucose-Capillary 01/04/2024 283 (H)  70 - 99 mg/dL Final   Comment 1 89/70/7974 Notify RN   Final   Comment 2 01/04/2024 Document in Chart   Final   Glucose-Capillary 01/04/2024 303 (H)  70 - 99 mg/dL Final   Comment 1 89/70/7974 Notify RN   Final   Comment 2 01/04/2024 Document in Chart   Final   Glucose-Capillary 01/04/2024 331 (H)  70 - 99 mg/dL Final   Comment 1 89/70/7974 Notify RN   Final   Comment 2 01/04/2024 Document in Chart   Final   Glucose-Capillary 01/04/2024 331 (H)  70 - 99 mg/dL Final   Glucose-Capillary 01/05/2024 226 (H)  70 - 99 mg/dL Final   Comment 1 89/69/7974 Notify RN   Final   Comment 2 01/05/2024 Document in Chart   Final   Glucose-Capillary 01/05/2024 439 (H)  70 - 99 mg/dL Final   Glucose-Capillary 01/05/2024 183 (H)  70 - 99 mg/dL Final   Glucose-Capillary 01/05/2024 237 (H)  70 - 99 mg/dL Final   Glucose-Capillary 01/06/2024 222 (H)  70 - 99 mg/dL Final     Vitals: Blood pressure 103/60, pulse (!) 101, temperature 98.9 F (37.2 C), temperature source Oral, resp. rate 17, height 5' 3 (1.6 m), weight 110 kg, SpO2 100%.    Oliva DELENA Salmon, DO

## 2024-01-06 NOTE — Group Note (Signed)
 Recreation Therapy Group Note   Group Topic:Problem Solving  Group Date: 01/06/2024 Start Time: 0934 End Time: 1000 Facilitators: Kateleen Encarnacion-McCall, LRT,CTRS Location: [address] Dayroom   Group Topic: Halloween Trivia  Goal Area(s) Addresses:  Patient will effectively work in a team with other group members. Patient will verbalize importance of using appropriate problem solving techniques.   Behavioral Response:   Intervention: Trivia  Activity: Set Designer. Patients worked together in teams. LRT read questions from 6 different categories (Classic Horror, Monster Movies, Slasher Flicks, Behind-the-Scenes, Caution! Plot and Beware the Grab Bag). Each group was to right down their answers. LRT would go over the answers with patients at the end of each category. The team with the most combined points (point total from each category) wins the game.    Education: Journalist, Newspaper, Team Building, Communication  Education Outcome: Acknowledges understanding/In group clarification offered/Needs additional education.    Affect/Mood: N/A   Participation Level: Did not attend    Clinical Observations/Individualized Feedback:     Plan: Continue to engage patient in RT group sessions 2-3x/week.   Kenitha Glendinning-McCall, LRT,CTRS 01/06/2024 11:51 AM

## 2024-01-06 NOTE — Progress Notes (Signed)
   01/06/24 2145  Psych Admission Type (Psych Patients Only)  Admission Status Voluntary  Psychosocial Assessment  Patient Complaints Worrying  Eye Contact Fair  Facial Expression Animated  Affect Appropriate to circumstance  Speech Logical/coherent  Interaction Assertive  Motor Activity Slow  Appearance/Hygiene Unremarkable  Behavior Characteristics Cooperative  Mood Pleasant  Thought Process  Coherency WDL  Content WDL  Delusions None reported or observed  Perception WDL  Hallucination None reported or observed  Judgment WDL  Confusion None  Danger to Self  Current suicidal ideation? Denies  Danger to Others  Danger to Others None reported or observed

## 2024-01-06 NOTE — BH IP Treatment Plan (Signed)
 Interdisciplinary Treatment and Diagnostic Plan Update  01/06/2024 Time of Session: 12:10 PM - UPDATE Grace Montgomery MRN: [id number]  Principal Diagnosis: MDD (major depressive disorder), recurrent episode, severe (HCC)  Secondary Diagnoses: Principal Problem:   MDD (major depressive disorder), recurrent episode, severe (HCC) Active Problems:   Moderate cannabis use disorder (HCC)   Moderate stimulant use disorder (HCC)   Alcohol use disorder, severe, dependence (HCC)   Current Medications:  Current Facility-Administered Medications  Medication Dose Route Frequency Provider Last Rate Last Admin   acetaminophen  (TYLENOL ) tablet 650 mg  650 mg Oral Q6H PRN Nkwenti, Doris, NP       albuterol  (VENTOLIN  HFA) 108 (90 Base) MCG/ACT inhaler 2 puff  2 puff Inhalation Q6H PRN Ntuen, Tina C, FNP       alum & mag hydroxide-simeth (MAALOX/MYLANTA) 200-200-20 MG/5ML suspension 30 mL  30 mL Oral Q4H PRN Tex Drilling, NP   30 mL at 01/04/24 1020   haloperidol  (HALDOL ) tablet 5 mg  5 mg Oral TID PRN Onuoha, Chinwendu V, NP       And   diphenhydrAMINE  (BENADRYL ) capsule 50 mg  50 mg Oral TID PRN Onuoha, Chinwendu V, NP       haloperidol  lactate (HALDOL ) injection 5 mg  5 mg Intramuscular TID PRN Onuoha, Chinwendu V, NP       And   diphenhydrAMINE  (BENADRYL ) injection 50 mg  50 mg Intramuscular TID PRN Onuoha, Chinwendu V, NP       And   LORazepam  (ATIVAN ) injection 2 mg  2 mg Intramuscular TID PRN Onuoha, Chinwendu V, NP       haloperidol  lactate (HALDOL ) injection 10 mg  10 mg Intramuscular TID PRN Onuoha, Chinwendu V, NP       And   diphenhydrAMINE  (BENADRYL ) injection 50 mg  50 mg Intramuscular TID PRN Onuoha, Chinwendu V, NP       And   LORazepam  (ATIVAN ) injection 2 mg  2 mg Intramuscular TID PRN Onuoha, Chinwendu V, NP       hydrOXYzine  (ATARAX ) tablet 25 mg  25 mg Oral TID PRN Tex Drilling, NP   25 mg at 01/05/24 2121   insulin  aspart (novoLOG ) injection 0-20 Units  0-20 Units  Subcutaneous TID WC Marry Clamp, MD   7 Units at 01/06/24 1725   insulin  aspart (novoLOG ) injection 0-5 Units  0-5 Units Subcutaneous QHS Ajibola, Ene A, NP   2 Units at 01/05/24 2208   magnesium  hydroxide (MILK OF MAGNESIA) suspension 30 mL  30 mL Oral Daily PRN Tex Drilling, NP       metFORMIN  (GLUCOPHAGE ) tablet 1,000 mg  1,000 mg Oral BID WC Ntuen, Tina C, FNP   1,000 mg at 01/06/24 1725   OLANZapine  (ZYPREXA ) injection 10 mg  10 mg Intramuscular TID PRN Tex Drilling, NP       OLANZapine  (ZYPREXA ) injection 5 mg  5 mg Intramuscular TID PRN Tex Drilling, NP       OLANZapine  zydis (ZYPREXA ) disintegrating tablet 5 mg  5 mg Oral TID PRN Tex Drilling, NP       ondansetron  (ZOFRAN -ODT) disintegrating tablet 8 mg  8 mg Oral BID PRN Marry Clamp, MD       polyethylene glycol (MIRALAX  / GLYCOLAX ) packet 17 g  17 g Oral Once Gabrielle, Nick, MD       Followed by   polyethylene glycol (MIRALAX  / GLYCOLAX ) packet 17 g  17 g Oral Once Marry Clamp, MD       Semaglutide  TABS 3 mg  3 mg Oral Daily Ntuen, Tina C, FNP   3 mg at 01/06/24 1044   sertraline  (ZOLOFT ) tablet 50 mg  50 mg Oral Daily Hoang, Daniela B, MD   50 mg at 01/06/24 1045   traZODone  (DESYREL ) tablet 150 mg  150 mg Oral QHS PRN Hoang, Daniela B, MD   150 mg at 01/05/24 2121   PTA Medications: Medications Prior to Admission  Medication Sig Dispense Refill Last Dose/Taking   albuterol  (VENTOLIN  HFA) 108 (90 Base) MCG/ACT inhaler Inhale 2 puffs into the lungs every 6 (six) hours as needed for wheezing or shortness of breath. (Patient taking differently: Inhale 2 puffs into the lungs every 4 (four) hours as needed for wheezing or shortness of breath.) 1 each 0    ARIPiprazole  (ABILIFY ) 5 MG tablet Take 1 tablet (5 mg total) by mouth daily. (Patient not taking: Reported on 12/29/2023) 30 tablet 0    escitalopram  (LEXAPRO ) 10 MG tablet Take 1 tablet (10 mg total) by mouth daily. (Patient not taking: Reported on 12/29/2023) 30  tablet 0    hydrOXYzine  (ATARAX ) 25 MG tablet Take 1 tablet (25 mg total) by mouth 3 (three) times daily as needed for anxiety. (Patient not taking: Reported on 12/29/2023) 30 tablet 0    hydrOXYzine  (VISTARIL ) 25 MG capsule Take 50 mg by mouth at bedtime.      ketoconazole  (NIZORAL ) 2 % shampoo Apply 1 Application topically daily as needed for irritation. (Patient taking differently: Apply 1 Application topically daily as needed for irritation (SCALP).) 120 mL 0    metFORMIN  (GLUCOPHAGE ) 1000 MG tablet Take 1 tablet (1,000 mg total) by mouth 2 (two) times daily with a meal. 60 tablet 0    Multiple Vitamin (MULTIVITAMIN WITH MINERALS) TABS tablet Take 1 tablet by mouth daily.      omeprazole  (PRILOSEC) 20 MG capsule Take 1 capsule (20 mg total) by mouth daily. (Patient taking differently: Take 20 mg by mouth daily before breakfast.) 30 capsule 3    RYBELSUS 3 MG TABS Take 3 mg by mouth daily.      thiamine  (VITAMIN B-1) 100 MG tablet Take 1 tablet (100 mg total) by mouth daily.      traZODone  (DESYREL ) 150 MG tablet Take 150 mg by mouth at bedtime.      trazodone  (DESYREL ) 300 MG tablet Take 0.5 tablets (150 mg total) by mouth at bedtime as needed for sleep. (Patient not taking: Reported on 12/29/2023) 30 tablet 0    triamcinolone  cream (KENALOG ) 0.1 % Apply 1 Application topically 2 (two) times daily. (Patient taking differently: Apply 1 Application topically See admin instructions. Apply to irritated/affected areas 2 times a day) 30 g 0     Patient Stressors: Financial difficulties   Medication change or noncompliance   Occupational concerns   Other: homelessness    Patient Strengths: Ability for insight  Capable of independent living  Communication skills   Treatment Modalities: Medication Management, Group therapy, Case management,  1 to 1 session with clinician, Psychoeducation, Recreational therapy.   Physician Treatment Plan for Primary Diagnosis: MDD (major depressive disorder),  recurrent episode, severe (HCC) Long Term Goal(s):     Short Term Goals: Ability to identify changes in lifestyle to reduce recurrence of condition will improve Ability to verbalize feelings will improve Ability to disclose and discuss suicidal ideas Ability to demonstrate self-control will improve Ability to identify and develop effective coping behaviors will improve Ability to maintain clinical measurements within normal limits will  improve Compliance with prescribed medications will improve Ability to identify triggers associated with substance abuse/mental health issues will improve  Medication Management: Evaluate patient's response, side effects, and tolerance of medication regimen.  Therapeutic Interventions: 1 to 1 sessions, Unit Group sessions and Medication administration.  Evaluation of Outcomes: Progressing  Physician Treatment Plan for Secondary Diagnosis: Principal Problem:   MDD (major depressive disorder), recurrent episode, severe (HCC) Active Problems:   Moderate cannabis use disorder (HCC)   Moderate stimulant use disorder (HCC)   Alcohol use disorder, severe, dependence (HCC)  Long Term Goal(s):     Short Term Goals: Ability to identify changes in lifestyle to reduce recurrence of condition will improve Ability to verbalize feelings will improve Ability to disclose and discuss suicidal ideas Ability to demonstrate self-control will improve Ability to identify and develop effective coping behaviors will improve Ability to maintain clinical measurements within normal limits will improve Compliance with prescribed medications will improve Ability to identify triggers associated with substance abuse/mental health issues will improve     Medication Management: Evaluate patient's response, side effects, and tolerance of medication regimen.  Therapeutic Interventions: 1 to 1 sessions, Unit Group sessions and Medication administration.  Evaluation of Outcomes:  Progressing   RN Treatment Plan for Primary Diagnosis: MDD (major depressive disorder), recurrent episode, severe (HCC) Long Term Goal(s): Knowledge of disease and therapeutic regimen to maintain health will improve  Short Term Goals: Ability to remain free from injury will improve, Ability to verbalize frustration and anger appropriately will improve, Ability to verbalize feelings will improve, and Ability to disclose and discuss suicidal ideas  Medication Management: RN will administer medications as ordered by provider, will assess and evaluate patient's response and provide education to patient for prescribed medication. RN will report any adverse and/or side effects to prescribing provider.  Therapeutic Interventions: 1 on 1 counseling sessions, Psychoeducation, Medication administration, Evaluate responses to treatment, Monitor vital signs and CBGs as ordered, Perform/monitor CIWA, COWS, AIMS and Fall Risk screenings as ordered, Perform wound care treatments as ordered.  Evaluation of Outcomes: Progressing   LCSW Treatment Plan for Primary Diagnosis: MDD (major depressive disorder), recurrent episode, severe (HCC) Long Term Goal(s): Safe transition to appropriate next level of care at discharge, Engage patient in therapeutic group addressing interpersonal concerns.  Short Term Goals: Engage patient in aftercare planning with referrals and resources, Increase ability to appropriately verbalize feelings, Facilitate acceptance of mental health diagnosis and concerns, and Identify triggers associated with mental health/substance abuse issues  Therapeutic Interventions: Assess for all discharge needs, 1 to 1 time with Social worker, Explore available resources and support systems, Assess for adequacy in community support network, Educate family and significant other(s) on suicide prevention, Complete Psychosocial Assessment, Interpersonal group therapy.  Evaluation of Outcomes:  Progressing   Progress in Treatment: Attending groups: attended some groups Participating in groups: Yes. Taking medication as prescribed: Yes. Toleration medication: Yes. Family/Significant other contact made: No, patient declined consent. Patient understands diagnosis: Yes. Discussing patient identified problems/goals with staff: Yes. Medical problems stabilized or resolved: Yes. Denies suicidal/homicidal ideation: Yes Issues/concerns per patient self-inventory: No.     Patient Goals:  Restart my medications   Discharge Plan or Barriers: Medically stabilize, restart outpatient medications, encourage patient to participate in SUD treatment.    Reason for Continuation of Hospitalization: Medication stabilization Suicidal ideation   Estimated Length of Stay: 2 - 3 days  Last 3 [place] Suicide Severity Risk Score: Flowsheet Row [facility]/2023 [facility] Admission (Discharged) [facility]  C-SSRS RISK CATEGORY No Risk No Risk High Risk    Last PHQ 2/9 Scores:    02/18/2023   10:32 AM 02/09/2023   11:35 AM 07/30/2021    2:27 PM  Depression screen PHQ 2/9  Decreased Interest 3 3 0  Down, Depressed, Hopeless 3 3 0  PHQ - 2 Score 6 6 0  Altered sleeping 2 3 0  Tired, decreased energy 0  0  Change in appetite 2 3 0  Feeling bad or failure about yourself  3 3 0  Trouble concentrating 2 3 0  Moving slowly or fidgety/restless 1 3 0  Suicidal thoughts 0 3 0  PHQ-9 Score 16 24 0  Difficult doing work/chores Very difficult  Not difficult at all    Scribe for Treatment Team: Hasset Chaviano O Nilo Fallin, LCSWA 01/06/2024 7:37 PM

## 2024-01-06 NOTE — Plan of Care (Signed)
  Problem: Education: Goal: Emotional status will improve Outcome: Progressing Goal: Mental status will improve Outcome: Progressing Goal: Verbalization of understanding the information provided will improve Outcome: Progressing   Problem: Activity: Goal: Interest or engagement in activities will improve Outcome: Not Progressing

## 2024-01-07 DIAGNOSIS — F332 Major depressive disorder, recurrent severe without psychotic features: Principal | ICD-10-CM

## 2024-01-07 DIAGNOSIS — F1520 Other stimulant dependence, uncomplicated: Secondary | ICD-10-CM

## 2024-01-07 DIAGNOSIS — F1220 Cannabis dependence, uncomplicated: Secondary | ICD-10-CM

## 2024-01-07 DIAGNOSIS — F1020 Alcohol dependence, uncomplicated: Secondary | ICD-10-CM

## 2024-01-07 LAB — GLUCOSE, CAPILLARY: Glucose-Capillary: 246 mg/dL — ABNORMAL HIGH (ref 70–99)

## 2024-01-07 MED ORDER — SERTRALINE HCL 50 MG PO TABS: 50.0000 mg | ORAL_TABLET | Freq: Every day | ORAL | 0 refills | Status: DC

## 2024-01-07 MED ORDER — OMEPRAZOLE 20 MG PO CPDR: 20.0000 mg | DELAYED_RELEASE_CAPSULE | Freq: Every day | ORAL | 3 refills | Status: DC

## 2024-01-07 NOTE — [facility] Suicide Risk Assessment (Signed)
 [facility] Discharge Suicide Risk Assessment   Principal Problem: MDD (major depressive disorder), recurrent episode, severe (HCC) Discharge Diagnoses: Principal Problem:   MDD (major depressive disorder), recurrent episode, severe (HCC) Active Problems:   Moderate cannabis use disorder (HCC)   Moderate stimulant use disorder (HCC)   Alcohol use disorder, severe, dependence (HCC)   Demographic Factors:  Low socioeconomic status and Unemployed  Loss Factors: Financial problems/change in socioeconomic status  Historical Factors: Prior suicide attempts and Impulsivity  Risk Reduction Factors:   NA  Continued Clinical Symptoms:  MDD, multiple substance use disorders  Cognitive Features That Contribute To Risk:  None    Suicide Risk:  Mild:  Suicidal ideation of limited frequency, intensity, duration, and specificity.  There are no identifiable plans, no associated intent, mild dysphoria and related symptoms, good self-control (both objective and subjective assessment), few other risk factors, and identifiable protective factors, including available and accessible social support.   Follow-up Information     [facility] Follow up on 01/12/2024.   Why: You have a hospital follow up appointment for therapy and medication management services on 01/12/24 at 9:30 am.  The appointment will be Virtual, telehealth. Contact information: [address]  [address] [place] [address] [phone]                  Oliva DELENA Salmon, DO 01/07/2024, 9:46 AM

## 2024-01-07 NOTE — Group Note (Signed)
 Date:  01/07/2024 Time:  9:05 AM  Group Topic/Focus:  Goals Group:   The focus of this group is to help patients establish daily goals to achieve during treatment and discuss how the patient can incorporate goal setting into their daily lives to aide in recovery.    Participation Level:  Did Not Attend  Participation Quality:  N/A  Affect:  N/A  Cognitive:  N/A  Insight: None  Engagement in Group:  None  Modes of Intervention:  N/A  Additional Comments:  Shawneen did not attend goals group.  Kristi HERO Quinteria Chisum 01/07/2024, 9:05 AM

## 2024-01-07 NOTE — Progress Notes (Signed)
  [facility] Adult Case Management Discharge Plan :  Will you be returning to the same living situation after discharge:  No, patient will be residing at the [facility]. At discharge, do you have transportation home?: Yes,  SW to assist.  Do you have the ability to pay for your medications: Yes,  patient has insurance.  Release of information consent forms completed and in the chart;  Patient's signature needed at discharge.  Patient to Follow up at:  Follow-up Information     [facility] Follow up on 01/12/2024.   Why: You have a hospital follow up appointment for therapy and medication management services on 01/12/24 at 9:30 am.  The appointment will be Virtual, telehealth. Contact information: [address]  [address] [place] [address] [phone]                 Next level of care provider has access to [facility] Link:no  Safety Planning and Suicide Prevention discussed: Yes,  SPE completed with patient.   Has patient been referred to the Quitline?: Patient refused referral for treatment  Patient has been referred for addiction treatment: Patient refused referral for treatment.  Hunter JONELLE Lever, LCSWA 01/07/2024, 9:39 AM

## 2024-01-07 NOTE — [facility] Suicide Risk Assessment (Signed)
 [facility] INPATIENT:  Family/Significant Other Suicide Prevention Education  Suicide Prevention Education:  SPE completed with patient, as patient refused to consent to family contact. SPI pamphlet provided to patient and patient was encouraged to share information with support network, ask questions, and talk about any concerns relating to SPE. Patient denies access to guns/firearms and verbalized understanding of information provided. Mobile Crisis and Suicide Prevention number [id number] information also provided to patient.

## 2024-01-07 NOTE — Plan of Care (Signed)
  Problem: Education: Goal: Knowledge of [facility] Education information/materials will improve Outcome: Completed/Met Goal: Emotional status will improve Outcome: Completed/Met Goal: Mental status will improve Outcome: Completed/Met Goal: Verbalization of understanding the information provided will improve Outcome: Completed/Met   Problem: Activity: Goal: Interest or engagement in activities will improve Outcome: Completed/Met Goal: Sleeping patterns will improve Outcome: Completed/Met   Problem: Coping: Goal: Ability to verbalize frustrations and anger appropriately will improve Outcome: Completed/Met Goal: Ability to demonstrate self-control will improve Outcome: Completed/Met   Problem: Physical Regulation: Goal: Ability to maintain clinical measurements within normal limits will improve Outcome: Completed/Met
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
## 2024-01-07 NOTE — Progress Notes (Signed)
 Patient ID: Grace Montgomery, female   DOB: Jul 08, 1990, 33 y.o.   MRN: [id number] Discharge instructions, medications and follow up appointments reviewed, pt verbalized understanding. Pt refused AVS, pt states she uses my chart. Pt belongings returned to patients satisfaction, pt dc home.

## 2024-01-07 NOTE — Progress Notes (Signed)
   01/07/24 0900  Psych Admission Type (Psych Patients Only)  Admission Status Voluntary  Psychosocial Assessment  Patient Complaints None  Eye Contact Fair  Facial Expression Animated  Affect Appropriate to circumstance  Speech Logical/coherent  Interaction Assertive  Motor Activity Slow  Appearance/Hygiene Unremarkable  Behavior Characteristics Cooperative  Mood Pleasant  Thought Process  Coherency WDL  Content WDL  Delusions None reported or observed  Perception WDL  Hallucination None reported or observed  Judgment WDL  Confusion None  Danger to Self  Current suicidal ideation? Denies  Agreement Not to Harm Self Yes  Description of Agreement verbal  Danger to Others  Danger to Others None reported or observed

## 2024-02-26 ENCOUNTER — Emergency Department (HOSPITAL_COMMUNITY)
Admission: EM | Admit: 2024-02-26 | Discharge: 2024-02-27 | Disposition: A | Payer: MEDICAID | Attending: Emergency Medicine | Admitting: Emergency Medicine

## 2024-02-26 ENCOUNTER — Other Ambulatory Visit: Payer: Self-pay

## 2024-02-26 ENCOUNTER — Encounter (HOSPITAL_COMMUNITY): Payer: Self-pay

## 2024-02-26 DIAGNOSIS — F419 Anxiety disorder, unspecified: Secondary | ICD-10-CM | POA: Diagnosis not present

## 2024-02-26 DIAGNOSIS — J45909 Unspecified asthma, uncomplicated: Secondary | ICD-10-CM | POA: Diagnosis not present

## 2024-02-26 DIAGNOSIS — E119 Type 2 diabetes mellitus without complications: Secondary | ICD-10-CM | POA: Diagnosis not present

## 2024-02-26 DIAGNOSIS — Z91040 Latex allergy status: Secondary | ICD-10-CM | POA: Diagnosis not present

## 2024-02-26 DIAGNOSIS — F10920 Alcohol use, unspecified with intoxication, uncomplicated: Secondary | ICD-10-CM

## 2024-02-26 DIAGNOSIS — F10129 Alcohol abuse with intoxication, unspecified: Secondary | ICD-10-CM | POA: Insufficient documentation

## 2024-02-26 DIAGNOSIS — Z79899 Other long term (current) drug therapy: Secondary | ICD-10-CM | POA: Diagnosis not present

## 2024-02-26 DIAGNOSIS — Z720 Tobacco use: Secondary | ICD-10-CM | POA: Diagnosis not present

## 2024-02-26 DIAGNOSIS — Z7984 Long term (current) use of oral hypoglycemic drugs: Secondary | ICD-10-CM | POA: Diagnosis not present

## 2024-02-26 DIAGNOSIS — F332 Major depressive disorder, recurrent severe without psychotic features: Secondary | ICD-10-CM | POA: Diagnosis not present

## 2024-02-26 DIAGNOSIS — R45851 Suicidal ideations: Secondary | ICD-10-CM | POA: Diagnosis not present
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
## 2024-02-27 ENCOUNTER — Emergency Department (HOSPITAL_COMMUNITY)
Admission: EM | Admit: 2024-02-27 | Discharge: 2024-02-28 | Disposition: A | Discharge status: Other Acute Inpatient Hospital | Payer: MEDICAID | Source: Home / Self Care

## 2024-02-27 DIAGNOSIS — R45851 Suicidal ideations: Secondary | ICD-10-CM

## 2024-02-27 DIAGNOSIS — F419 Anxiety disorder, unspecified: Secondary | ICD-10-CM

## 2024-02-27 DIAGNOSIS — F411 Generalized anxiety disorder: Secondary | ICD-10-CM | POA: Diagnosis present

## 2024-02-27 DIAGNOSIS — Z720 Tobacco use: Secondary | ICD-10-CM | POA: Insufficient documentation

## 2024-02-27 DIAGNOSIS — F332 Major depressive disorder, recurrent severe without psychotic features: Secondary | ICD-10-CM | POA: Diagnosis not present

## 2024-02-27 DIAGNOSIS — J45909 Unspecified asthma, uncomplicated: Secondary | ICD-10-CM | POA: Insufficient documentation

## 2024-02-27 DIAGNOSIS — E119 Type 2 diabetes mellitus without complications: Secondary | ICD-10-CM | POA: Insufficient documentation

## 2024-02-27 LAB — CBC WITH DIFFERENTIAL/PLATELET
Abs Immature Granulocytes: 0.02 K/uL (ref 0.00–0.07)
Abs Immature Granulocytes: 0.04 K/uL (ref 0.00–0.07)
Basophils Absolute: 0.1 K/uL (ref 0.0–0.1)
Basophils Absolute: 0.1 K/uL (ref 0.0–0.1)
Basophils Relative: 1 %
Basophils Relative: 1 %
Eosinophils Absolute: 0.2 K/uL (ref 0.0–0.5)
Eosinophils Absolute: 0.2 K/uL (ref 0.0–0.5)
Eosinophils Relative: 2 %
Eosinophils Relative: 2 %
HCT: 33.4 % — ABNORMAL LOW (ref 36.0–46.0)
HCT: 38.3 % (ref 36.0–46.0)
Hemoglobin: 10.6 g/dL — ABNORMAL LOW (ref 12.0–15.0)
Hemoglobin: 12.4 g/dL (ref 12.0–15.0)
Immature Granulocytes: 0 %
Immature Granulocytes: 0 %
Lymphocytes Relative: 31 %
Lymphocytes Relative: 51 %
Lymphs Abs: 2.9 K/uL (ref 0.7–4.0)
Lymphs Abs: 5.3 K/uL — ABNORMAL HIGH (ref 0.7–4.0)
MCH: 27.6 pg (ref 26.0–34.0)
MCH: 28.1 pg (ref 26.0–34.0)
MCHC: 31.7 g/dL (ref 30.0–36.0)
MCHC: 32.4 g/dL (ref 30.0–36.0)
MCV: 86.7 fL (ref 80.0–100.0)
MCV: 87 fL (ref 80.0–100.0)
Monocytes Absolute: 0.4 K/uL (ref 0.1–1.0)
Monocytes Absolute: 0.5 K/uL (ref 0.1–1.0)
Monocytes Relative: 4 %
Monocytes Relative: 6 %
Neutro Abs: 4.4 K/uL (ref 1.7–7.7)
Neutro Abs: 5.4 K/uL (ref 1.7–7.7)
Neutrophils Relative %: 42 %
Neutrophils Relative %: 60 %
Platelets: 348 K/uL (ref 150–400)
Platelets: 405 K/uL — ABNORMAL HIGH (ref 150–400)
RBC: 3.84 MIL/uL — ABNORMAL LOW (ref 3.87–5.11)
RBC: 4.42 MIL/uL (ref 3.87–5.11)
RDW: 18.2 % — ABNORMAL HIGH (ref 11.5–15.5)
RDW: 18.5 % — ABNORMAL HIGH (ref 11.5–15.5)
WBC: 10.4 K/uL (ref 4.0–10.5)
WBC: 9.1 K/uL (ref 4.0–10.5)
nRBC: 0 % (ref 0.0–0.2)
nRBC: 0 % (ref 0.0–0.2)

## 2024-02-27 LAB — BASIC METABOLIC PANEL WITH GFR
Anion gap: 16 — ABNORMAL HIGH (ref 5–15)
BUN: <5 mg/dL — ABNORMAL LOW (ref 6–20)
CO2: 20 mmol/L — ABNORMAL LOW (ref 22–32)
Calcium: 9.3 mg/dL (ref 8.9–10.3)
Chloride: 109 mmol/L (ref 98–111)
Creatinine, Ser: 0.74 mg/dL (ref 0.44–1.00)
GFR, Estimated: >60 mL/min
Glucose, Bld: 172 mg/dL — ABNORMAL HIGH (ref 70–99)
Potassium: 4.2 mmol/L (ref 3.5–5.1)
Sodium: 145 mmol/L (ref 135–145)

## 2024-02-27 LAB — URINE DRUG SCREEN
Amphetamines: NEGATIVE
Barbiturates: NEGATIVE
Benzodiazepines: NEGATIVE
Cocaine: POSITIVE — AB
Fentanyl: NEGATIVE
Methadone Scn, Ur: NEGATIVE
Opiates: NEGATIVE
Tetrahydrocannabinol: POSITIVE — AB

## 2024-02-27 LAB — CBG MONITORING, ED: Glucose-Capillary: 139 mg/dL — ABNORMAL HIGH (ref 70–99)

## 2024-02-27 LAB — HCG, SERUM, QUALITATIVE: Preg, Serum: NEGATIVE

## 2024-02-27 LAB — ETHANOL: Alcohol, Ethyl (B): <15 mg/dL

## 2024-02-27 MED ORDER — LORAZEPAM 1 MG PO TABS: 0.0000 mg | ORAL_TABLET | Freq: Two times a day (BID) | ORAL | Status: DC

## 2024-02-27 MED ORDER — SERTRALINE HCL 50 MG PO TABS: 50.0000 mg | ORAL_TABLET | Freq: Every day | ORAL | Status: DC

## 2024-02-27 MED ORDER — LORAZEPAM 1 MG PO TABS: 0.0000 mg | ORAL_TABLET | Freq: Four times a day (QID) | ORAL | Status: DC

## 2024-02-27 MED ORDER — THIAMINE MONONITRATE 100 MG PO TABS
100.0000 mg | ORAL_TABLET | Freq: Every day | ORAL | Status: DC
  Administered 2024-02-27: 100 mg via ORAL
  Filled 2024-02-27: qty 1

## 2024-02-27 MED ORDER — TRAZODONE HCL 50 MG PO TABS: 150.0000 mg | ORAL_TABLET | Freq: Every evening | ORAL | Status: DC | PRN

## 2024-02-27 MED ORDER — THIAMINE HCL 100 MG/ML IJ SOLN: 100.0000 mg | Freq: Every day | INTRAMUSCULAR | Status: DC

## 2024-02-27 MED ORDER — LORAZEPAM 2 MG/ML IJ SOLN: 0.0000 mg | Freq: Four times a day (QID) | INTRAMUSCULAR | Status: DC

## 2024-02-27 MED ORDER — LORAZEPAM 2 MG/ML IJ SOLN: 0.0000 mg | Freq: Two times a day (BID) | INTRAMUSCULAR | Status: DC

## 2024-02-27 MED ORDER — HYDROXYZINE HCL 25 MG PO TABS: 25.0000 mg | ORAL_TABLET | Freq: Three times a day (TID) | ORAL | Status: DC | PRN

## 2024-02-27 NOTE — ED Notes (Signed)
 Patient consumed 3 cups of water .
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
## 2024-02-27 NOTE — ED Notes (Signed)
 I have asked pt twice for a urine sample and pt has refused.

## 2024-02-27 NOTE — ED Notes (Signed)
Pt refused to get vital signs taken.  

## 2024-02-27 NOTE — ED Notes (Signed)
 Pt signed vol consent for treatment. Report needs to be called to [facility] in the am and safe transport needs to be called in the am.

## 2024-02-27 NOTE — ED Triage Notes (Signed)
 Patient c/o SI with no plans. Patient was just discharge this morning.

## 2024-02-27 NOTE — ED Notes (Signed)
 Pt provided with discharged papers and resources, pt provided with bus pass. Pt refused bus pass and resources stating I'm not going there, I already know I gotta check back in and be put on suicide watch. Ubaldo PA made aware of pt's statement and refusal of resources.

## 2024-02-27 NOTE — ED Notes (Signed)
 Attempted to take pt's vitals in lobby multiple times. Pt was uncooperative, then refused.

## 2024-02-27 NOTE — Progress Notes (Addendum)
 Pt has been accepted to [facility] on 02/27/2024  . Bed assignment: 304-2   Pt meets inpatient criteria per Cathaleen Adam, NP   Attending Physician will be  Dr. Raliegh    Report can be called to: - Adult unit: [phone]  Pt can arrive on 12/23; [facility] will update   Care Team Notified: [facility] Cherylynn Ernst, RN, April Wilson, Paramedic, Chesley Holt, Cathaleen Adam, NP

## 2024-02-27 NOTE — Discharge Instructions (Addendum)
 Please follow-up with your primary care provider to discuss your anxiety.  I recommend that you moderate your alcohol consumption.  Return to the emergency department if you develop any life-threatening symptoms

## 2024-02-27 NOTE — ED Provider Notes (Signed)
 " [place] EMERGENCY DEPARTMENT AT [facility] Provider Note   CSN: [id number] Arrival date & time: 02/27/24  9347     Patient presents with: Suicidal   Grace Montgomery is a 33 y.o. female.   33 year old female with past medical history of alcohol abuse and diabetes presenting to the emergency department today with suicidal ideations.  The patient states that she has been having intermittent suicidal ideations now over the past few days.  She reports that she has attempted suicide in the past.  She states that her plan was to jump off a bridge.  She was here initially last night with alcohol intoxication and was denying any SI at that time but now she is sobered up this morning she states she is having these feelings that have been more pervasive.  She is requesting help at this time.        Prior to Admission medications  Medication Sig Start Date End Date Taking? Authorizing Provider  albuterol  (VENTOLIN  HFA) 108 (90 Base) MCG/ACT inhaler Inhale 2 puffs into the lungs every 6 (six) hours as needed for wheezing or shortness of breath. Patient taking differently: Inhale 2 puffs into the lungs every 4 (four) hours as needed for wheezing or shortness of breath. 06/02/23   Massengill, Rankin, MD  ketoconazole  (NIZORAL ) 2 % shampoo Apply 1 Application topically daily as needed for irritation. Patient taking differently: Apply 1 Application topically daily as needed for irritation (SCALP). 10/11/23   Lenard Calin, MD  metFORMIN  (GLUCOPHAGE ) 1000 MG tablet Take 1 tablet (1,000 mg total) by mouth 2 (two) times daily with a meal. 10/11/23   Lenard Calin, MD  omeprazole  (PRILOSEC) 20 MG capsule Take 1 capsule (20 mg total) by mouth daily. 01/07/24   Prentis Kitchens A, DO  RYBELSUS  3 MG TABS Take 3 mg by mouth daily. 11/23/23   [provider]  sertraline  (ZOLOFT ) 50 MG tablet Take 1 tablet (50 mg total) by mouth daily. 01/08/24   Prentis Kitchens LABOR, DO  triamcinolone  cream  (KENALOG ) 0.1 % Apply 1 Application topically 2 (two) times daily. Patient taking differently: Apply 1 Application topically See admin instructions. Apply to irritated/affected areas 2 times a day 10/11/23   Lenard Calin, MD    Allergies: Monistat [tioconazole], Chocolate, Ibuprofen , Tape, Banana, and Latex    Review of Systems  Psychiatric/Behavioral:  Positive for suicidal ideas.   All other systems reviewed and are negative.   Updated Vital Signs BP 107/78 (BP Location: Left Arm)   Pulse 71   Temp 98.3 F (36.8 C)   Resp 18   SpO2 100%   Physical Exam Vitals and nursing note reviewed.   Gen: NAD Eyes: PERRL, EOMI HEENT: no oropharyngeal swelling Neck: trachea midline Resp: clear to auscultation bilaterally Card: RRR, no murmurs, rubs, or gallops Abd: nontender, nondistended Extremities: no calf tenderness, no edema Vascular: 2+ radial pulses bilaterally, 2+ DP pulses bilaterally Skin: no rashes Psyc: acting appropriately   (all labs ordered are listed, but only abnormal results are displayed) Labs Reviewed  CBC WITH DIFFERENTIAL/PLATELET - Abnormal; Notable for the following components:      Result Value   RBC 3.84 (*)    Hemoglobin 10.6 (*)    HCT 33.4 (*)    RDW 18.5 (*)    All other components within normal limits  ETHANOL  HCG, SERUM, QUALITATIVE  URINE DRUG SCREEN    EKG: None  Radiology: No results found.   Procedures   Medications Ordered  in the ED  LORazepam  (ATIVAN ) injection 0-4 mg (has no administration in time range)    Or  LORazepam  (ATIVAN ) tablet 0-4 mg (has no administration in time range)  LORazepam  (ATIVAN ) injection 0-4 mg (has no administration in time range)    Or  LORazepam  (ATIVAN ) tablet 0-4 mg (has no administration in time range)  thiamine  (VITAMIN B1) tablet 100 mg (has no administration in time range)    Or  thiamine  (VITAMIN B1) injection 100 mg (has no administration in time range)                                     Medical Decision Making 33 year old female with past medical history of diabetes, alcohol abuse, and depression presenting to the emergency department today with suicidal ideations.  The patient is here voluntarily at this time.  She was here earlier this morning with alcohol intoxication.  She is not appear to be clinically intoxicated but certainly could have some alcohol on board.  I will hold off on IVC at this time as the patient is voluntary pending her alcohol level.  She already did have some basic labs earlier this morning.  If she decides to leave we will place her on IVC but if she is intoxicated and her cancer statements when she is clinically sober she could potentially be appropriate for outpatient management but we will have her evaluated by our psychiatry team here.  Suspect that she may require psychiatric admission.  The patient's labs are unremarkable.  She is medically cleared for psychiatric evaluation.  Amount and/or Complexity of Data Reviewed Labs: ordered.  Risk OTC drugs. Prescription drug management.        Final diagnoses:  Suicidal ideation    ED Discharge Orders     None          Ula Prentice SAUNDERS, MD 02/27/24 1200  "

## 2024-02-27 NOTE — ED Notes (Signed)
Gave pt a drink  

## 2024-02-27 NOTE — ED Provider Notes (Signed)
 " [place] EMERGENCY DEPARTMENT AT [facility] Provider Note   CSN: [id number] Arrival date & time: 02/26/24  2026     Patient presents with: Anxiety and Alcohol Intoxication   Grace Montgomery is a 33 y.o. female.  Patient with past medical history significant for type II DM, major depressive disorder, suicidal ideations, anxiety disorder, cannabis use disorder, and alcohol use disorder presents to the emergency department via EMS after being found on the side of the road.  Patient reports drinking more alcohol than usual and having an anxiety attack.  Her husband had been walking on the road to get a motel room for the night and found the patient unconscious on the ground with alcohol bottles beside her.  Patient also endorses smoking marijuana this evening.  She is denying suicidal ideations, intent at self-harm, hallucinations, homicidal ideations.  She tells me at the time of my assessment that she was having an anxiety attack but that the anxiety is calming down.  She denies other physical complaints at this time.    Anxiety  Alcohol Intoxication       Prior to Admission medications  Medication Sig Start Date End Date Taking? Authorizing Provider  albuterol  (VENTOLIN  HFA) 108 (90 Base) MCG/ACT inhaler Inhale 2 puffs into the lungs every 6 (six) hours as needed for wheezing or shortness of breath. Patient taking differently: Inhale 2 puffs into the lungs every 4 (four) hours as needed for wheezing or shortness of breath. 06/02/23   Massengill, Rankin, MD  ketoconazole  (NIZORAL ) 2 % shampoo Apply 1 Application topically daily as needed for irritation. Patient taking differently: Apply 1 Application topically daily as needed for irritation (SCALP). 10/11/23   Lenard Calin, MD  metFORMIN  (GLUCOPHAGE ) 1000 MG tablet Take 1 tablet (1,000 mg total) by mouth 2 (two) times daily with a meal. 10/11/23   Lenard Calin, MD  omeprazole  (PRILOSEC) 20 MG capsule Take 1 capsule (20  mg total) by mouth daily. 01/07/24   Bouchard, Marc A, DO  RYBELSUS  3 MG TABS Take 3 mg by mouth daily. 11/23/23   [provider]  sertraline  (ZOLOFT ) 50 MG tablet Take 1 tablet (50 mg total) by mouth daily. 01/08/24   Prentis Kitchens A, DO  triamcinolone  cream (KENALOG ) 0.1 % Apply 1 Application topically 2 (two) times daily. Patient taking differently: Apply 1 Application topically See admin instructions. Apply to irritated/affected areas 2 times a day 10/11/23   Lenard Calin, MD    Allergies: Monistat [tioconazole], Chocolate, Ibuprofen , Tape, Banana, and Latex    Review of Systems  Updated Vital Signs BP (!) 113/96 (BP Location: Right Arm)   Pulse (!) 106   Temp 98.2 F (36.8 C) (Oral)   Resp 18   SpO2 100%   Physical Exam Vitals and nursing note reviewed.  Constitutional:      General: She is not in acute distress.    Appearance: She is well-developed.  HENT:     Head: Normocephalic and atraumatic.  Eyes:     Conjunctiva/sclera: Conjunctivae normal.  Cardiovascular:     Rate and Rhythm: Normal rate and regular rhythm.  Pulmonary:     Effort: Pulmonary effort is normal. No respiratory distress.     Breath sounds: Normal breath sounds.  Abdominal:     Palpations: Abdomen is soft.     Tenderness: There is no abdominal tenderness.  Musculoskeletal:        General: No swelling.     Cervical back: Neck supple.  Skin:  General: Skin is warm and dry.     Capillary Refill: Capillary refill takes less than 2 seconds.  Neurological:     Mental Status: She is alert and oriented to person, place, and time.  Psychiatric:        Mood and Affect: Mood normal.     (all labs ordered are listed, but only abnormal results are displayed) Labs Reviewed  CBC WITH DIFFERENTIAL/PLATELET - Abnormal; Notable for the following components:      Result Value   RDW 18.2 (*)    Platelets 405 (*)    Lymphs Abs 5.3 (*)    All other components within normal limits  BASIC METABOLIC  PANEL WITH GFR - Abnormal; Notable for the following components:   CO2 20 (*)    Glucose, Bld 172 (*)    BUN <5 (*)    Anion gap 16 (*)    All other components within normal limits  ETHANOL    EKG: None  Radiology: No results found.   Procedures   Medications Ordered in the ED - No data to display                                  Medical Decision Making Amount and/or Complexity of Data Reviewed Labs: ordered.   This patient presents to the ED for concern of anxiety and potential loss of consciousness, this involves an extensive number of treatment options, and is a complaint that carries with it a high risk of complications and morbidity.  The differential diagnosis includes acute intoxication, anxiety attack, intracranial abnormality, electrolyte abnormality, metabolic abnormality, others   Co morbidities / Chronic conditions that complicate the patient evaluation  As noted in HPI   Additional history obtained:  Additional history obtained from EMR External records from outside source obtained and reviewed including behavioral health notes from October when patient was admitted due to [facility]   Lab Tests:  I Ordered, and personally interpreted labs.  The pertinent results include: Bicarb of 20, anion gap of 16 likely in the setting of alcohol   Social Determinants of Health:  Patient has experienced homelessness over the past year   Test / Admission - Considered:  Patient appears to be acutely intoxicated with endorsed alcohol and marijuana usage.  Her anxiety has dissipated without any intervention.  At this time I plan to allow the patient metabolize until she is sober enough to safely discharge home.  No signs of any injuries.  No physical complaints at this time.  The patient is alert and oriented and is tolerating oral intake. Patient now able to walk safely.  Patient stable for discharge home      Final diagnoses:  Alcoholic intoxication without  complication  Anxiety    ED Discharge Orders     None          Logan Ubaldo KATHEE DEVONNA 02/27/24 0547    Trine Raynell Moder, MD 02/27/24 [phone]  "

## 2024-02-28 ENCOUNTER — Encounter (HOSPITAL_COMMUNITY): Payer: Self-pay | Admitting: Student in an Organized Health Care Education/Training Program

## 2024-02-28 ENCOUNTER — Other Ambulatory Visit: Payer: Self-pay

## 2024-02-28 ENCOUNTER — Inpatient Hospital Stay (HOSPITAL_COMMUNITY)
Admission: AD | Admit: 2024-02-28 | Discharge: 2024-03-05 | DRG: 885 | Disposition: A | Discharge status: Home / Self Care | Payer: MEDICAID | Source: Intra-hospital

## 2024-02-28 DIAGNOSIS — F332 Major depressive disorder, recurrent severe without psychotic features: Secondary | ICD-10-CM | POA: Diagnosis not present

## 2024-02-28 DIAGNOSIS — F1020 Alcohol dependence, uncomplicated: Secondary | ICD-10-CM

## 2024-02-28 DIAGNOSIS — Z59868 Other specified financial insecurity: Secondary | ICD-10-CM

## 2024-02-28 DIAGNOSIS — K5900 Constipation, unspecified: Secondary | ICD-10-CM | POA: Diagnosis present

## 2024-02-28 DIAGNOSIS — Z5941 Food insecurity: Secondary | ICD-10-CM

## 2024-02-28 DIAGNOSIS — Z7984 Long term (current) use of oral hypoglycemic drugs: Secondary | ICD-10-CM | POA: Diagnosis not present

## 2024-02-28 DIAGNOSIS — Z560 Unemployment, unspecified: Secondary | ICD-10-CM | POA: Diagnosis not present

## 2024-02-28 DIAGNOSIS — Z91048 Other nonmedicinal substance allergy status: Secondary | ICD-10-CM

## 2024-02-28 DIAGNOSIS — Z5982 Transportation insecurity: Secondary | ICD-10-CM | POA: Diagnosis not present

## 2024-02-28 DIAGNOSIS — F1024 Alcohol dependence with alcohol-induced mood disorder: Secondary | ICD-10-CM | POA: Diagnosis present

## 2024-02-28 DIAGNOSIS — F17210 Nicotine dependence, cigarettes, uncomplicated: Secondary | ICD-10-CM | POA: Diagnosis present

## 2024-02-28 DIAGNOSIS — Z6791 Unspecified blood type, Rh negative: Secondary | ICD-10-CM | POA: Diagnosis not present

## 2024-02-28 DIAGNOSIS — Z818 Family history of other mental and behavioral disorders: Secondary | ICD-10-CM | POA: Diagnosis not present

## 2024-02-28 DIAGNOSIS — Z79899 Other long term (current) drug therapy: Secondary | ICD-10-CM | POA: Diagnosis not present

## 2024-02-28 DIAGNOSIS — Z5900 Homelessness unspecified: Secondary | ICD-10-CM | POA: Diagnosis not present

## 2024-02-28 DIAGNOSIS — Z886 Allergy status to analgesic agent status: Secondary | ICD-10-CM | POA: Diagnosis not present

## 2024-02-28 DIAGNOSIS — Z91018 Allergy to other foods: Secondary | ICD-10-CM | POA: Diagnosis not present

## 2024-02-28 DIAGNOSIS — F1410 Cocaine abuse, uncomplicated: Secondary | ICD-10-CM | POA: Diagnosis present

## 2024-02-28 DIAGNOSIS — Z888 Allergy status to other drugs, medicaments and biological substances status: Secondary | ICD-10-CM | POA: Diagnosis not present

## 2024-02-28 DIAGNOSIS — Z5989 Other problems related to housing and economic circumstances: Secondary | ICD-10-CM

## 2024-02-28 DIAGNOSIS — Z555 Less than a high school diploma: Secondary | ICD-10-CM | POA: Diagnosis not present

## 2024-02-28 DIAGNOSIS — E119 Type 2 diabetes mellitus without complications: Secondary | ICD-10-CM | POA: Diagnosis present

## 2024-02-28 DIAGNOSIS — Z833 Family history of diabetes mellitus: Secondary | ICD-10-CM

## 2024-02-28 DIAGNOSIS — Z91040 Latex allergy status: Secondary | ICD-10-CM

## 2024-02-28 DIAGNOSIS — Z9049 Acquired absence of other specified parts of digestive tract: Secondary | ICD-10-CM

## 2024-02-28 DIAGNOSIS — G4700 Insomnia, unspecified: Secondary | ICD-10-CM | POA: Diagnosis present

## 2024-02-28 DIAGNOSIS — R45851 Suicidal ideations: Secondary | ICD-10-CM | POA: Diagnosis present

## 2024-02-28 DIAGNOSIS — Z91148 Patient's other noncompliance with medication regimen for other reason: Secondary | ICD-10-CM

## 2024-02-28 DIAGNOSIS — Z811 Family history of alcohol abuse and dependence: Secondary | ICD-10-CM

## 2024-02-28 LAB — GLUCOSE, CAPILLARY
Glucose-Capillary: 150 mg/dL — ABNORMAL HIGH (ref 70–99)
Glucose-Capillary: 173 mg/dL — ABNORMAL HIGH (ref 70–99)

## 2024-02-28 MED ORDER — TRAZODONE HCL 50 MG PO TABS: 50.0000 mg | ORAL_TABLET | Freq: Every evening | ORAL | Status: DC | PRN

## 2024-02-28 MED ORDER — ADULT MULTIVITAMIN W/MINERALS CH
1.0000 | ORAL_TABLET | Freq: Every day | ORAL | Status: DC
  Administered 2024-02-28 – 2024-03-05 (×7): 1 via ORAL
  Filled 2024-02-28: qty 1
  Filled 2024-02-28: qty 7
  Filled 2024-02-28: qty 1
  Filled 2024-02-28: qty 7
  Filled 2024-02-28 (×5): qty 1

## 2024-02-28 MED ORDER — HALOPERIDOL LACTATE 5 MG/ML IJ SOLN: 5.0000 mg | Freq: Three times a day (TID) | INTRAMUSCULAR | Status: DC | PRN

## 2024-02-28 MED ORDER — INSULIN ASPART 100 UNIT/ML IJ SOLN
0.0000 [IU] | Freq: Every day | INTRAMUSCULAR | Status: DC
  Administered 2024-03-02: 3 [IU] via SUBCUTANEOUS

## 2024-02-28 MED ORDER — METFORMIN HCL 500 MG PO TABS
1000.0000 mg | ORAL_TABLET | Freq: Two times a day (BID) | ORAL | Status: DC
  Administered 2024-02-28 – 2024-03-05 (×12): 1000 mg via ORAL
  Filled 2024-02-28 (×9): qty 2
  Filled 2024-02-28: qty 28
  Filled 2024-02-28 (×3): qty 2

## 2024-02-28 MED ORDER — LORAZEPAM 2 MG/ML IJ SOLN: 2.0000 mg | Freq: Three times a day (TID) | INTRAMUSCULAR | Status: DC | PRN

## 2024-02-28 MED ORDER — HYDROXYZINE HCL 25 MG PO TABS
25.0000 mg | ORAL_TABLET | Freq: Three times a day (TID) | ORAL | Status: DC | PRN
  Administered 2024-02-28 – 2024-03-04 (×6): 25 mg via ORAL
  Filled 2024-02-28 (×6): qty 1
  Filled 2024-02-28: qty 10

## 2024-02-28 MED ORDER — ALUM & MAG HYDROXIDE-SIMETH 200-200-20 MG/5ML PO SUSP: 30.0000 mL | ORAL | Status: DC | PRN

## 2024-02-28 MED ORDER — INSULIN ASPART 100 UNIT/ML IJ SOLN
0.0000 [IU] | Freq: Three times a day (TID) | INTRAMUSCULAR | Status: DC
  Administered 2024-02-28: 2 [IU] via SUBCUTANEOUS
  Administered 2024-02-29: 5 [IU] via SUBCUTANEOUS
  Administered 2024-02-29: 3 [IU] via SUBCUTANEOUS
  Administered 2024-03-01: 5 [IU] via SUBCUTANEOUS
  Administered 2024-03-01: 3 [IU] via SUBCUTANEOUS
  Administered 2024-03-02: 2 [IU] via SUBCUTANEOUS
  Administered 2024-03-02: 5 [IU] via SUBCUTANEOUS
  Administered 2024-03-02: 3 [IU] via SUBCUTANEOUS
  Administered 2024-03-03: 5 [IU] via SUBCUTANEOUS
  Administered 2024-03-03: 8 [IU] via SUBCUTANEOUS
  Administered 2024-03-03 – 2024-03-04 (×3): 3 [IU] via SUBCUTANEOUS
  Administered 2024-03-04: 5 [IU] via SUBCUTANEOUS
  Administered 2024-03-05: 3 [IU] via SUBCUTANEOUS

## 2024-02-28 MED ORDER — ACETAMINOPHEN 325 MG PO TABS
650.0000 mg | ORAL_TABLET | Freq: Four times a day (QID) | ORAL | Status: DC | PRN
  Administered 2024-02-28 – 2024-03-03 (×5): 650 mg via ORAL
  Filled 2024-02-28 (×5): qty 2

## 2024-02-28 MED ORDER — SERTRALINE HCL 50 MG PO TABS
50.0000 mg | ORAL_TABLET | Freq: Every day | ORAL | Status: DC
  Administered 2024-02-28 – 2024-03-01 (×3): 50 mg via ORAL
  Filled 2024-02-28 (×3): qty 1

## 2024-02-28 MED ORDER — LORAZEPAM 1 MG PO TABS: 1.0000 mg | ORAL_TABLET | Freq: Four times a day (QID) | ORAL | Status: AC | PRN

## 2024-02-28 MED ORDER — LOPERAMIDE HCL 2 MG PO CAPS: 2.0000 mg | ORAL_CAPSULE | ORAL | Status: AC | PRN

## 2024-02-28 MED ORDER — HALOPERIDOL 5 MG PO TABS: 5.0000 mg | ORAL_TABLET | Freq: Three times a day (TID) | ORAL | Status: DC | PRN

## 2024-02-28 MED ORDER — ONDANSETRON 4 MG PO TBDP: 4.0000 mg | ORAL_TABLET | Freq: Four times a day (QID) | ORAL | Status: AC | PRN

## 2024-02-28 MED ORDER — TRAZODONE HCL 50 MG PO TABS
50.0000 mg | ORAL_TABLET | Freq: Every evening | ORAL | Status: DC | PRN
  Administered 2024-02-28 – 2024-02-29 (×2): 50 mg via ORAL
  Filled 2024-02-28 (×2): qty 1

## 2024-02-28 MED ORDER — DIPHENHYDRAMINE HCL 25 MG PO CAPS: 50.0000 mg | ORAL_CAPSULE | Freq: Three times a day (TID) | ORAL | Status: DC | PRN

## 2024-02-28 MED ORDER — MAGNESIUM HYDROXIDE 400 MG/5ML PO SUSP
30.0000 mL | Freq: Every day | ORAL | Status: DC | PRN
  Filled 2024-02-28: qty 30

## 2024-02-28 MED ORDER — DIPHENHYDRAMINE HCL 50 MG/ML IJ SOLN: 50.0000 mg | Freq: Three times a day (TID) | INTRAMUSCULAR | Status: DC | PRN

## 2024-02-28 MED ORDER — VITAMIN B-1 100 MG PO TABS
100.0000 mg | ORAL_TABLET | Freq: Every day | ORAL | Status: DC
  Administered 2024-02-29 – 2024-03-05 (×6): 100 mg via ORAL
  Filled 2024-02-28 (×6): qty 1

## 2024-02-28 MED ORDER — HALOPERIDOL LACTATE 5 MG/ML IJ SOLN: 10.0000 mg | Freq: Three times a day (TID) | INTRAMUSCULAR | Status: DC | PRN

## 2024-02-28 NOTE — Progress Notes (Addendum)
(  Sleep Hours) -8.25  (Any PRNs that were needed, meds refused, or side effects to meds)- acetaminophen  650mg ; hydroxyzine  25mg ; Trazodone  50mg  Refused am labs and CBG  (Any disturbances and when (visitation, over night)-none  (Concerns raised by the patient)- c/o BLE discomfort  (SI/HI/AVH)-denies

## 2024-02-28 NOTE — [facility] Group Notes (Signed)
 Patient did not attend the Social Work group.

## 2024-02-28 NOTE — Plan of Care (Signed)
   Problem: Education: Goal: Knowledge of [facility] [facility] Education information/materials will improve Outcome: Progressing Goal: Emotional status will improve Outcome: Progressing Goal: Mental status will improve Outcome: Progressing

## 2024-02-28 NOTE — Progress Notes (Signed)
 Tour of Duty:  Grace JINNY Angle, RN, 02/28/2024, Tour of Duty: 0700-1900  SI/HI/AVH: Denies  Self-Reported   Mood: Negative  Anxiety: Denies Depression: Denies, but Observable Irritability: Denies, but Observable  Broset  Violence Prevention Guidelines *See Row Information*: Moderate Violence Risk interventions implemented   LBM  Last BM Date : 02/27/24   Pain: present, PRN provided (see MAR)  Patient Refusals (including Rx): No  >>Shift Summary: Patient observed to be sullen and depressed on unit. Patient able to make needs known. Patient observed to engage appropriately with staff and peers. Patient taking medications as prescribed. This shift, PRN medication requested or required. No reported or observed side effects to medication. No reported or observed agitation, aggression, or other acute emotional distress. No reported or observed physical abnormalities or concerns.  Last Vitals  Vitals Weight: 108.4 kg Temp: 98.5 F (36.9 C) Temp Source: Oral Pulse Rate: 61 Resp: 18 BP: (!) 112/57 Patient Position: (not recorded)  Admission Type  Psych Admission Type (Psych Patients Only) Admission Status: Voluntary Date 72 hour document signed : (not recorded) Time 72 hour document signed : (not recorded) Provider Notified (First and Last Name) (see details for LINK to note): (not recorded)   Psychosocial Assessment  Psychosocial Assessment Patient Complaints: Irritability, Anxiety Eye Contact: Fair Facial Expression: Sullen Affect: Sullen Speech: Logical/coherent Interaction: Demanding Motor Activity: Slow Appearance/Hygiene: Unremarkable Behavior Characteristics: Resistant to care Mood: Depressed, Irritable   Aggressive Behavior  Targets: (not recorded)   Thought Process  Thought Process Coherency: Within Defined Limits Content: Within Defined Limits Delusions: None reported or observed Perception: Within Defined Limits Hallucination: None reported or  observed Judgment: Poor Confusion: None  Danger to Self/Others  Danger to Self Current suicidal ideation?: Denies Description of Suicide Plan: (not recorded) Self-Injurious Behavior: (not recorded) Agreement Not to Harm Self: (not recorded) Description of Agreement: (not recorded) Danger to Others: None reported or observed

## 2024-02-28 NOTE — [facility] Group Notes (Signed)
 Adult Psychoeducational Group Note  Date:  02/28/2024 Time:  1:35 PM  Group Topic/Focus:  Goals Group:   The focus of this group is to help patients establish daily goals to achieve during treatment and discuss how the patient can incorporate goal setting into their daily lives to aide in recovery. Orientation:   The focus of this group is to educate the patient on the purpose and policies of crisis stabilization and provide a format to answer questions about their admission.  The group details unit policies and expectations of patients while admitted.  Participation Level:  Active  Participation Quality:  Attentive  Affect:  Appropriate  Cognitive:  Alert  Insight: Appropriate  Engagement in Group:  Engaged  Modes of Intervention:  Discussion  Additional Comments:  Patient attended and participated in the Goals and Orientation group.  Grace Montgomery 02/28/2024, 1:35 PM

## 2024-02-28 NOTE — Progress Notes (Addendum)
 Admission Note  Admission Date & Time: 02/28/2024 @ 0025  Grace Montgomery is a 33 year old female with past medical history of alcohol abuse, depression and diabetes evaluated in the emergency department for suicidal ideations with a plan to jump off a bridge. She has been having intermittent suicidal ideations now over the past few days. She is voluntarily admitted to [facility].   She was evaluated earlier in the day in the ED for alcohol intoxication where she denied SI at the time. Pt reports non-compliance with medications prescribed.   During admission she is irritable and resistant to care. She required multiple prompting and proving to complete skin assessment and admission questions. She would roll her eyes and constantly say say this is stupid  when being educated on the unit policies and rules. Denies current SI, HI,  or AVH.   Skin was assessed and found to be clear of any abnormal marks except for bruising in inner left elbow. Pt searched and no contraband found. Consents obtained. Food and fluids offered, and fluids accepted. Pt had no additional questions or concerns.

## 2024-02-28 NOTE — Group Note (Signed)
 LCSW Group Therapy Note  Group Date: 02/28/2024 Start Time: 1100 End Time: 1200   Type of Therapy and Topic:  Group Therapy - Healthy vs Unhealthy Coping Skills  Participation Level:  Did Not Attend   Description of Group The focus of this group was to determine what unhealthy coping techniques typically are used by group members and what healthy coping techniques would be helpful in coping with various problems. Patients were guided in becoming aware of the differences between healthy and unhealthy coping techniques. Patients were asked to identify 2-3 healthy coping skills they would like to learn to use more effectively.  Therapeutic Goals Patients learned that coping is what human beings do all day long to deal with various situations in their lives Patients defined and discussed healthy vs unhealthy coping techniques Patients identified their preferred coping techniques and identified whether these were healthy or unhealthy Patients determined 2-3 healthy coping skills they would like to become more familiar with and use more often. Patients provided support and ideas to each other   Summary of Patient Progress:  Pt was invited but did not attend Therapeutic Modalities Cognitive Behavioral Therapy Motivational Interviewing  Golda Louder, LCSWA 02/28/2024  12:21 PM

## 2024-02-28 NOTE — [facility] Group Notes (Signed)
 Adult Psychoeducational Group Note  Date:  02/28/2024 Time:  8:56 PM  Group Topic/Focus:  Wrap-Up Group:   The focus of this group is to help patients review their daily goal of treatment and discuss progress on daily workbooks.  Participation Level:  Active  Participation Quality:  Attentive  Affect:  Appropriate  Cognitive:  Alert  Insight: Appropriate  Engagement in Group:  Engaged  Modes of Intervention:  Discussion  Additional Comments:  Patient attended and participated in the Wrap-up group.  Grace Montgomery 02/28/2024, 8:56 PM

## 2024-02-28 NOTE — Group Note (Signed)
 Recreation Therapy Group Note   Group Topic:Animal Assisted Therapy   Group Date: 02/28/2024 Start Time: 0945 End Time: 1030 Facilitators: Ziv Welchel-McCall, LRT,CTRS Location: [address] Dayroom   Animal-Assisted Activity (AAA) Program Checklist/Progress Notes Patient Eligibility Criteria Checklist & Daily Group note for Rec Tx Intervention  AAA/T Program Assumption of Risk Form signed by Patient/ or Parent Legal Guardian Yes  Patient understands his/her participation is voluntary Yes  Behavioral Response:    Education: Charity Fundraiser, Appropriate Animal Interaction   Education Outcome: Acknowledges education.    Affect/Mood: N/A   Participation Level: Did not attend    Clinical Observations/Individualized Feedback:      Plan: Continue to engage patient in RT group sessions 2-3x/week.   Vivian Neuwirth-McCall, LRT,CTRS 02/28/2024 12:20 PM

## 2024-02-28 NOTE — Plan of Care (Signed)
   Problem: Education: Goal: Knowledge of [facility] Education information/materials will improve Outcome: Progressing Goal: Emotional status will improve Outcome: Progressing Goal: Mental status will improve Outcome: Progressing Goal: Verbalization of understanding the information provided will improve Outcome: Progressing

## 2024-02-28 NOTE — [facility] Suicide Risk Assessment (Signed)
 [facility] Admission Suicide Risk Assessment   Nursing information obtained from:  Patient Demographic factors:  Low socioeconomic status Current Mental Status:  NA Loss Factors:  Financial problems / change in socioeconomic status, Decline in physical health Historical Factors:  Impulsivity, Prior suicide attempts Risk Reduction Factors:  NA  Total Time spent with patient: 1 hour Principal Problem: MDD (major depressive disorder), recurrent severe, without psychosis (HCC) Diagnosis:  Principal Problem:   MDD (major depressive disorder), recurrent severe, without psychosis (HCC) Active Problems:   Type 2 diabetes mellitus without complication, without long-term current use of insulin  (HCC)   Alcohol use disorder, severe, dependence (HCC)  Subjective Data:  Grace Montgomery is a 33 yr old female who presented on 12/22 to [facility] with SI w/ a plan (jump from a bridge), she was admitted to [facility] on 12/23.  PPHx is significant for Depression, Polysubstance Abuse (EtOH, Cocaine, THC), Multiple Suicide Attempts, and Multiple Prior Psychiatric Hospitalizations (last- [facility] 12/2023), and no history of Self Injurious Behavior.   He reports that she has been depressed for a few weeks.  When asked if any specific incident happened she reports no but that it is the holidays and she cannot see her kids and has nowhere to go.  She reports that she had been waiting on acceptance to an apartment but she got the letter 2 to 3 days late and so now is afraid she has lost the apartment.  She reports she been on the wait list for 6 to 7 years.  She reports that she always has chronic SI but missing this letter did significantly worsen it.   She reports a past psychiatric history significant for Depression and Polysubstance Abuse (EtOH, Cocaine, THC).  She reports a history significant for multiple suicide attempts.  She reports no history of self-injurious behavior.  She reports multiple prior psychiatric hospitalizations  last-[facility] 12/2023.  She reports past medical history significant for diabetes.  She reports past surgical history significant for gallbladder removal.  She reports no history of seizures.  She reports no history of head trauma.  She reports an allergy to ibuprofen -feet swelling.   She reports she is currently unhoused.  She reports she is currently unemployed.  She reports she graduated high school.  When asked about alcohol use she reports she does drink and when asked how much she reports I do not count.  She reports no history of TD or withdrawal seizures.  She reports smoking couple cigarettes a day.  She reports no illicit substance use (per chart review UDS positive for cocaine and THC).  She reports no current legal issues.  She reports no access to firearms.   Discussed starting medication with her.  She reports that Zoloft  has been helpful in the past and is agreeable to restarting it.  When asked if she wanted residential rehab she declines it at this time.  She reports pain and swelling in her feet from walking so much.  Discussed with her that she does have Tylenol  as needed ordered and we would put some extra blankets under her feet to keep them elevated to reduce the swelling.  She reports no other concerns at present.  Continued Clinical Symptoms:  Alcohol Use Disorder Identification Test Final Score (AUDIT): 9 The Alcohol Use Disorders Identification Test, Guidelines for Use in Primary Care, Second Edition.  [employer] Writer [facility]). Score between 0-7:  no or low risk or alcohol related problems. Score between 8-15:  moderate risk of alcohol related  problems. Score between 16-19:  high risk of alcohol related problems. Score 20 or above:  warrants further diagnostic evaluation for alcohol dependence and treatment.   CLINICAL FACTORS:   Depression:   Comorbid alcohol abuse/dependence Severe Alcohol/Substance Abuse/Dependencies More than one psychiatric diagnosis Unstable  or Poor Therapeutic Relationship Previous Psychiatric Diagnoses and Treatments Medical Diagnoses and Treatments/Surgeries   Musculoskeletal: Strength & Muscle Tone: within normal limits Gait & Station: laying in bed Patient leans: N/A  Psychiatric Specialty Exam:  Presentation  General Appearance:  Appropriate for Environment; Casual  Eye Contact: Fair  Speech: Clear and Coherent; Normal Rate  Speech Volume: Normal  Handedness: Right   Mood and Affect  Mood: Depressed  Affect: Congruent; Depressed; Flat   Thought Process  Thought Processes: Coherent; Linear  Descriptions of Associations:Intact  Orientation:Full (Time, Place and Person)  Thought Content:Logical; WDL  History of Schizophrenia/Schizoaffective disorder:No  Duration of Psychotic Symptoms:No data recorded Hallucinations:Hallucinations: None  Ideas of Reference:None  Suicidal Thoughts:Suicidal Thoughts: Yes, Passive  Homicidal Thoughts:Homicidal Thoughts: No   Sensorium  Memory: Immediate Fair; Recent Fair  Judgment: Intact  Insight: Present   Executive Functions  Concentration: Fair  Attention Span: Fair  Recall: [employer] of Knowledge: Fair  Language: Fair   Psychomotor Activity  Psychomotor Activity: Psychomotor Activity: Normal   Assets  Assets: Resilience; Desire for Improvement   Sleep  Sleep: Sleep: Poor    Physical Exam: Physical Exam Vitals and nursing note reviewed.  Constitutional:      General: She is not in acute distress.    Appearance: Normal appearance. She is obese. She is not ill-appearing or toxic-appearing.  HENT:     Head: Normocephalic and atraumatic.  Pulmonary:     Effort: Pulmonary effort is normal.  Neurological:     Mental Status: She is alert.    Review of Systems  Respiratory:  Negative for cough and shortness of breath.   Cardiovascular:  Negative for chest pain.  Gastrointestinal:  Negative for abdominal  pain, constipation, diarrhea, nausea and vomiting.  Neurological:  Negative for dizziness, weakness and headaches.  Psychiatric/Behavioral:  Positive for depression, substance abuse and suicidal ideas (Passive). Negative for hallucinations. The patient is nervous/anxious.    Blood pressure 129/87, pulse 82, temperature 98.5 F (36.9 C), temperature source Oral, resp. rate 18, height 5' 3 (1.6 m), weight 108.4 kg, SpO2 100%. Body mass index is 42.34 kg/m.   COGNITIVE FEATURES THAT CONTRIBUTE TO RISK:  Closed-mindedness    SUICIDE RISK:   Moderate:  Frequent suicidal ideation with limited intensity, and duration, some specificity in terms of plans, no associated intent, good self-control, limited dysphoria/symptomatology, some risk factors present, and identifiable protective factors, including available and accessible social support.  PLAN OF CARE:  Grace Montgomery is a 33 yr old female who presented on 12/22 to [facility] with SI w/ a plan (jump from a bridge), she was admitted to [facility] on 12/23.  PPHx is significant for Depression, Polysubstance Abuse (EtOH, Cocaine, THC), Multiple Suicide Attempts, and Multiple Prior Psychiatric Hospitalizations (last- [facility] 12/2023), and no history of Self Injurious Behavior.     Grace Montgomery has a long history of depression and medication noncompliance.  Since she reports the Zoloft  has been helpful with her symptoms in the past we will restart this.  As she was guarded about how much alcohol she drinks we will start CIWA.  We will start sliding scale insulin  given her history of diabetes.     MDD, Recurrent, Severe, w/out Psychosis: -  Start Zoloft  50 mg daily for depression -Continue Agitation Protocol: Haldol /Ativan /Benadryl      Withdrawal: -Start CIWA -Start Ativan  1 mg q6 PRN CIWA>10 -Start Imodium  2-4 mg PRN diarrhea -Start  Zofran -ODT 4 mg q6 PRN nausea -Start Thiamine  100 mg daily for nutritional supplementation -Start Multivitamin daily for nutritional  supplementation     Type 2 Diabetes: -Start Sliding Scale     -Continue PRN's: Tylenol , Maalox, Atarax , Milk of Magnesia, Trazodone   I certify that inpatient services furnished can reasonably be expected to improve the patient's condition.   Grace GORMAN Rosser, DO 02/28/2024, 12:29 PM
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
## 2024-02-29 ENCOUNTER — Encounter (HOSPITAL_COMMUNITY): Payer: Self-pay

## 2024-02-29 LAB — GLUCOSE, CAPILLARY
Glucose-Capillary: 191 mg/dL — ABNORMAL HIGH (ref 70–99)
Glucose-Capillary: 235 mg/dL — ABNORMAL HIGH (ref 70–99)
Glucose-Capillary: 166 mg/dL — ABNORMAL HIGH (ref 70–99)

## 2024-02-29 MED ORDER — WHITE PETROLATUM EX OINT
TOPICAL_OINTMENT | CUTANEOUS | Status: AC
  Administered 2024-02-29: 1
  Filled 2024-02-29: qty 5

## 2024-02-29 NOTE — [facility] Suicide Risk Assessment (Signed)
 [facility] INPATIENT:  Family/Significant Other Suicide Prevention Education  Suicide Prevention Education:  Patient Refusal for Family/Significant Other Suicide Prevention Education: The patient Grace Montgomery has refused to provide written consent for family/significant other to be provided Family/Significant Other Suicide Prevention Education during admission and/or prior to discharge.  Physician notified.  Derick JONELLE Blanch 02/29/2024, 9:54 AM

## 2024-02-29 NOTE — Group Note (Signed)
 Recreation Therapy Group Note   Group Topic:Problem Solving  Group Date: 02/29/2024 Start Time: 0935 End Time: 1010 Facilitators: Adyn Hoes-McCall, LRT,CTRS Location: [address] Dayroom   Group Topic: Communication, Team Building, Problem Solving  Goal Area(s) Addresses:  Patient will effectively work with peer towards shared goal.  Patient will identify skills used to make activity successful.  Patient will share challenges and verbalize solution-driven approaches used. Patient will identify how skills used during activity can be used to reach post d/c goals.   Behavioral Response:   Intervention: STEM Activity   Activity: [employer]. Patients were provided the following materials: 5 drinking straws, 5 rubber bands, 5 paper clips, 2 index cards and 2 drinking cups. Using the provided materials patients were asked to build a launching mechanism to launch a ping pong ball across the room, approximately 10 feet. Patients were divided into teams of 3-5. Instructions required all materials be incorporated into the device, functionality of items left to the peer group's discretion.  Education: Pharmacist, Community, Scientist, Physiological, Air Cabin Crew, Building Control Surveyor.   Education Outcome: Acknowledges education/In group clarification offered/Needs additional education.    Affect/Mood: N/A   Participation Level: Did not attend    Clinical Observations/Individualized Feedback:      Plan: Continue to engage patient in RT group sessions 2-3x/week.   Svea Pusch-McCall, LRT,CTRS 02/29/2024 12:33 PM
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
## 2024-02-29 NOTE — Progress Notes (Signed)
 Patient denies SI, HI, AVH. Patient stated they slept Fair last night. Scored 0/10 on anxiety and 8/10 on depression. Patient has been calm, cooperative, and med compliant.    02/29/24 1000  Psych Admission Type (Psych Patients Only)  Admission Status Voluntary  Psychosocial Assessment  Patient Complaints Depression  Eye Contact Fair  Facial Expression Flat  Affect Appropriate to circumstance  Speech Logical/coherent  Interaction Assertive  Motor Activity Slow  Appearance/Hygiene Unremarkable  Behavior Characteristics Cooperative;Calm  Mood Pleasant  Thought Process  Coherency WDL  Content WDL  Delusions None reported or observed  Perception WDL  Hallucination None reported or observed  Judgment Poor  Confusion None  Danger to Self  Current suicidal ideation? Denies  Agreement Not to Harm Self Yes  Description of Agreement Verbal  Danger to Others  Danger to Others None reported or observed

## 2024-02-29 NOTE — Group Note (Unsigned)
 Date:  02/29/2024 Time:  3:44 PM  Group Topic/Focus:  Spirituality:   The focus of this group is to discuss how one's spirituality can aide in recovery. Date:  02/29/2024 Time:  3:39 PM  @GROUPNOTE @  Participation Level:  Active  Participation Quality:  Appropriate  Affect:  Appropriate  Cognitive:  Alert  Insight: Appropriate  Engagement in Group:  Engaged   Additional Comments:  Attended  Grace Montgomery 02/29/2024, 3:39 PM      Participation Level:  {BHH PARTICIPATION OZCZO:77735}  Participation Quality:  {BHH PARTICIPATION QUALITY:22265}  Affect:  {BHH AFFECT:22266}  Cognitive:  {BHH COGNITIVE:22267}  Insight: {BHH Insight2:20797}  Engagement in Group:  {BHH ENGAGEMENT IN HMNLE:77731}  Modes of Intervention:  {BHH MODES OF INTERVENTION:22269}  Additional Comments:  ***  Grace Montgomery 02/29/2024, 3:44 PM

## 2024-02-29 NOTE — BH IP Treatment Plan (Signed)
 Interdisciplinary Treatment and Diagnostic Plan Update  02/29/2024 Time of Session: 1005AM Grace Montgomery MRN: [id number]  Principal Diagnosis: MDD (major depressive disorder), recurrent severe, without psychosis (HCC)  Secondary Diagnoses: Principal Problem:   MDD (major depressive disorder), recurrent severe, without psychosis (HCC) Active Problems:   Type 2 diabetes mellitus without complication, without long-term current use of insulin  (HCC)   Alcohol use disorder, severe, dependence (HCC)   Current Medications:  Current Facility-Administered Medications  Medication Dose Route Frequency Provider Last Rate Last Admin   acetaminophen  (TYLENOL ) tablet 650 mg  650 mg Oral Q6H PRN Trudy Carwin, NP   650 mg at 02/28/24 2104   alum & mag hydroxide-simeth (MAALOX/MYLANTA) 200-200-20 MG/5ML suspension 30 mL  30 mL Oral Q4H PRN Trudy Carwin, NP       haloperidol  (HALDOL ) tablet 5 mg  5 mg Oral TID PRN Trudy Carwin, NP       And   diphenhydrAMINE  (BENADRYL ) capsule 50 mg  50 mg Oral TID PRN Trudy Carwin, NP       haloperidol  lactate (HALDOL ) injection 5 mg  5 mg Intramuscular TID PRN Trudy Carwin, NP       And   diphenhydrAMINE  (BENADRYL ) injection 50 mg  50 mg Intramuscular TID PRN Trudy Carwin, NP       And   LORazepam  (ATIVAN ) injection 2 mg  2 mg Intramuscular TID PRN Trudy Carwin, NP       haloperidol  lactate (HALDOL ) injection 10 mg  10 mg Intramuscular TID PRN Trudy Carwin, NP       And   diphenhydrAMINE  (BENADRYL ) injection 50 mg  50 mg Intramuscular TID PRN Trudy Carwin, NP       And   LORazepam  (ATIVAN ) injection 2 mg  2 mg Intramuscular TID PRN Trudy Carwin, NP       hydrOXYzine  (ATARAX ) tablet 25 mg  25 mg Oral TID PRN Pashayan, Alexander S, DO   25 mg at 02/28/24 2104   insulin  aspart (novoLOG ) injection 0-15 Units  0-15 Units Subcutaneous TID WC Pashayan, Alexander S, DO   3 Units at 02/29/24 1158   insulin  aspart (novoLOG ) injection 0-5 Units  0-5 Units  Subcutaneous QHS Pashayan, Alexander S, DO       loperamide  (IMODIUM ) capsule 2-4 mg  2-4 mg Oral PRN Pashayan, Alexander S, DO       LORazepam  (ATIVAN ) tablet 1 mg  1 mg Oral Q6H PRN Pashayan, Alexander S, DO       magnesium  hydroxide (MILK OF MAGNESIA) suspension 30 mL  30 mL Oral Daily PRN Trudy Carwin, NP       metFORMIN  (GLUCOPHAGE ) tablet 1,000 mg  1,000 mg Oral BID WC Pashayan, Alexander S, DO   1,000 mg at 02/29/24 1024   multivitamin with minerals tablet 1 tablet  1 tablet Oral Daily Pashayan, Alexander S, DO   1 tablet at 02/29/24 1023   ondansetron  (ZOFRAN -ODT) disintegrating tablet 4 mg  4 mg Oral Q6H PRN Pashayan, Alexander S, DO       sertraline  (ZOLOFT ) tablet 50 mg  50 mg Oral Daily Pashayan, Alexander S, DO   50 mg at 02/29/24 1023   thiamine  (Vitamin B-1) tablet 100 mg  100 mg Oral Daily Pashayan, Alexander S, DO   100 mg at 02/29/24 1023   traZODone  (DESYREL ) tablet 50 mg  50 mg Oral QHS,MR X 1 Pashayan, Alexander S, DO   50 mg at 02/28/24 2104   PTA Medications: Medications Prior to Admission  Medication  Sig Dispense Refill Last Dose/Taking   albuterol  (VENTOLIN  HFA) 108 (90 Base) MCG/ACT inhaler Inhale 2 puffs into the lungs every 6 (six) hours as needed for wheezing or shortness of breath. (Patient taking differently: Inhale 2 puffs into the lungs every 4 (four) hours as needed for wheezing or shortness of breath.) 1 each 0    ketoconazole  (NIZORAL ) 2 % shampoo Apply 1 Application topically daily as needed for irritation. (Patient taking differently: Apply 1 Application topically daily as needed for irritation (SCALP).) 120 mL 0    metFORMIN  (GLUCOPHAGE ) 1000 MG tablet Take 1 tablet (1,000 mg total) by mouth 2 (two) times daily with a meal. 60 tablet 0    omeprazole  (PRILOSEC) 20 MG capsule Take 1 capsule (20 mg total) by mouth daily. (Patient not taking: Reported on 02/27/2024) 30 capsule 3    RYBELSUS  3 MG TABS Take 3 mg by mouth daily.      sertraline  (ZOLOFT ) 50 MG  tablet Take 1 tablet (50 mg total) by mouth daily. 30 tablet 0    triamcinolone  cream (KENALOG ) 0.1 % Apply 1 Application topically 2 (two) times daily. (Patient taking differently: Apply 1 Application topically 2 (two) times daily. Apply to irritated/affected areas 2 times a day) 30 g 0     Patient Stressors:    Patient Strengths:    Treatment Modalities: Medication Management, Group therapy, Case management,  1 to 1 session with clinician, Psychoeducation, Recreational therapy.   Physician Treatment Plan for Primary Diagnosis: MDD (major depressive disorder), recurrent severe, without psychosis (HCC) Long Term Goal(s): Improvement in symptoms so as ready for discharge   Short Term Goals: Ability to identify changes in lifestyle to reduce recurrence of condition will improve Ability to verbalize feelings will improve Ability to disclose and discuss suicidal ideas Ability to demonstrate self-control will improve Ability to identify and develop effective coping behaviors will improve Ability to maintain clinical measurements within normal limits will improve Compliance with prescribed medications will improve Ability to identify triggers associated with substance abuse/mental health issues will improve  Medication Management: Evaluate patient's response, side effects, and tolerance of medication regimen.  Therapeutic Interventions: 1 to 1 sessions, Unit Group sessions and Medication administration.  Evaluation of Outcomes: Not Progressing  Physician Treatment Plan for Secondary Diagnosis: Principal Problem:   MDD (major depressive disorder), recurrent severe, without psychosis (HCC) Active Problems:   Type 2 diabetes mellitus without complication, without long-term current use of insulin  (HCC)   Alcohol use disorder, severe, dependence (HCC)  Long Term Goal(s): Improvement in symptoms so as ready for discharge   Short Term Goals: Ability to identify changes in lifestyle to reduce  recurrence of condition will improve Ability to verbalize feelings will improve Ability to disclose and discuss suicidal ideas Ability to demonstrate self-control will improve Ability to identify and develop effective coping behaviors will improve Ability to maintain clinical measurements within normal limits will improve Compliance with prescribed medications will improve Ability to identify triggers associated with substance abuse/mental health issues will improve     Medication Management: Evaluate patient's response, side effects, and tolerance of medication regimen.  Therapeutic Interventions: 1 to 1 sessions, Unit Group sessions and Medication administration.  Evaluation of Outcomes: Not Progressing   RN Treatment Plan for Primary Diagnosis: MDD (major depressive disorder), recurrent severe, without psychosis (HCC) Long Term Goal(s): Knowledge of disease and therapeutic regimen to maintain health will improve  Short Term Goals: Ability to remain free from injury will improve, Ability to verbalize frustration and anger  appropriately will improve, Ability to demonstrate self-control, Ability to participate in decision making will improve, Ability to verbalize feelings will improve, Ability to disclose and discuss suicidal ideas, Ability to identify and develop effective coping behaviors will improve, and Compliance with prescribed medications will improve  Medication Management: RN will administer medications as ordered by provider, will assess and evaluate patient's response and provide education to patient for prescribed medication. RN will report any adverse and/or side effects to prescribing provider.  Therapeutic Interventions: 1 on 1 counseling sessions, Psychoeducation, Medication administration, Evaluate responses to treatment, Monitor vital signs and CBGs as ordered, Perform/monitor CIWA, COWS, AIMS and Fall Risk screenings as ordered, Perform wound care treatments as  ordered.  Evaluation of Outcomes: Not Progressing   LCSW Treatment Plan for Primary Diagnosis: MDD (major depressive disorder), recurrent severe, without psychosis (HCC) Long Term Goal(s): Safe transition to appropriate next level of care at discharge, Engage patient in therapeutic group addressing interpersonal concerns.  Short Term Goals: Engage patient in aftercare planning with referrals and resources, Increase social support, Increase ability to appropriately verbalize feelings, Increase emotional regulation, Facilitate acceptance of mental health diagnosis and concerns, Facilitate patient progression through stages of change regarding substance use diagnoses and concerns, Identify triggers associated with mental health/substance abuse issues, and Increase skills for wellness and recovery  Therapeutic Interventions: Assess for all discharge needs, 1 to 1 time with Social worker, Explore available resources and support systems, Assess for adequacy in community support network, Educate family and significant other(s) on suicide prevention, Complete Psychosocial Assessment, Interpersonal group therapy.  Evaluation of Outcomes: Not Progressing   Progress in Treatment: Attending groups: No. Participating in groups: No. Taking medication as prescribed: Yes. Toleration medication: Yes. Family/Significant other contact made: No, will contact:  pt declined consents Patient understands diagnosis: Yes. Discussing patient identified problems/goals with staff: Yes. Medical problems stabilized or resolved: Yes. Denies suicidal/homicidal ideation: Yes. Issues/concerns per patient self-inventory: No.  New problem(s) identified: No, Describe:  none  New Short Term/Long Term Goal(s): detox, medication management for mood stabilization; elimination of SI thoughts; development of comprehensive mental wellness/sobriety plan   Patient Goals:  Medication and a place to go when I  discharge  Discharge Plan or Barriers: Patient recently admitted. CSW will continue to follow and assess for appropriate referrals and possible discharge planning.    Reason for Continuation of Hospitalization: Depression Medication stabilization Suicidal ideation  Estimated Length of Stay: 5-7 days  Last 3 [place] Suicide Severity Risk Score: Flowsheet Row Ad[facility] (Current) [facility] from 02/27/20[facility] in [facility] ED from [facility]/26/2024 in [facility] at [facility]  C-SSRS RISK CATEGORY Low Risk Low Risk No Risk    Last [facility] 2/9 Scores:    02/18/2023   10:32 AM 02/09/2023   11:35 AM 07/30/2021    2:27 PM  Depression screen PHQ 2/9  Decreased Interest 3 3 0  Down, Depressed, Hopeless 3 3 0  PHQ - 2 Score 6 6 0  Altered sleeping 2 3 0  Tired, decreased energy 0  0  Change in appetite 2 3 0  Feeling bad or failure about yourself  3 3 0  Trouble concentrating 2 3 0  Moving slowly or fidgety/restless 1 3 0  Suicidal thoughts 0 3 0  PHQ-9 Score 16  24  0   Difficult doing work/chores Very difficult  Not difficult at all     Data  saved with a previous flowsheet row definition    Scribe for Treatment Team: Jenkins LULLA Primer, ISRA[place] 02/29/2024 1:31 PM

## 2024-02-29 NOTE — Plan of Care (Signed)
   Problem: Education: Goal: Knowledge of [facility] Education information/materials will improve Outcome: Progressing Goal: Emotional status will improve Outcome: Progressing Goal: Mental status will improve Outcome: Progressing Goal: Verbalization of understanding the information provided will improve Outcome: Progressing   Problem: Activity: Goal: Interest or engagement in activities will improve Outcome: Progressing

## 2024-02-29 NOTE — Group Note (Unsigned)
 Date:  02/29/2024 Time:  9:38 AM  Group Topic/Focus:  Goals Group:   The focus of this group is to help patients establish daily goals to achieve during treatment and discuss how the patient can incorporate goal setting into their daily lives to aide in recovery. Orientation:   The focus of this group is to educate the patient on the purpose and policies of crisis stabilization and provide a format to answer questions about their admission.  The group details unit policies and expectations of patients while admitted.     Participation Level:  {BHH PARTICIPATION OZCZO:77735}  Participation Quality:  {BHH PARTICIPATION QUALITY:22265}  Affect:  {BHH AFFECT:22266}  Cognitive:  {BHH COGNITIVE:22267}  Insight: {BHH Insight2:20797}  Engagement in Group:  {BHH ENGAGEMENT IN HMNLE:77731}  Modes of Intervention:  {BHH MODES OF INTERVENTION:22269}  Additional Comments:  ***  Lauris JONELLE Morales 02/29/2024, 9:38 AM

## 2024-02-29 NOTE — Plan of Care (Signed)
   Problem: Education: Goal: Knowledge of [facility] [facility] Education information/materials will improve Outcome: Progressing Goal: Emotional status will improve Outcome: Progressing Goal: Mental status will improve Outcome: Progressing

## 2024-02-29 NOTE — Group Note (Signed)
 Date:  02/29/2024 Time:  10:13 PM  Group Topic/Focus:  NA Group:      Additional Comments:  PT attend NA group they remain calm and participated in group.   Marquette GORMAN [place] 02/29/2024, 10:13 PM

## 2024-02-29 NOTE — B[facility] Group Notes (Signed)
 Spirituality Group   Description: Participant directed exploration of values, beliefs and meaning   **Group focused on self-compassion as additional supportive and healing method; locating our gifts   Following a brief framework of chaplains role and ground rules of group behavior, participants are invited to share concerns or questions that engage spiritual life. Emphasis placed on common themes and shared experiences and ways to make meaning and clarify living into ones values.   Theory/Process/Goal: Utilize the theoretical framework of group therapy established by Celena Kite, Relational Cultural Theory and Rogerian approaches to facilitate relational empathy and use of the here and now to foster reflection, self-awareness, and sharing.   Observations: Grace Montgomery was present for the full group but did not engage in the conversation.  [place] L. Delores HERO.Div

## 2024-02-29 NOTE — Group Note (Signed)
 Date:  02/29/2024 Time:  10:27 AM  Group Topic/Focus: Recreational Therapy     Pt did not attend recreational therapy group  Grace Montgomery R Damika Harmon 02/29/2024, 10:27 AM

## 2024-02-29 NOTE — Progress Notes (Signed)
(  Sleep Hours) - 1 of 2 notes  (Any PRNs that were needed, meds refused, or side effects to meds)- hydroxyzine  25mg   (Any disturbances and when (visitation, over night)-none  (Concerns raised by the patient)- none  (SI/HI/AVH)-denies

## 2024-02-29 NOTE — BH IP Treatment Plan (Deleted)
 Interdisciplinary Treatment and Diagnostic Plan Update  02/29/2024 Time of Session: 10:05 AM Grace Montgomery MRN: [id number]  Principal Diagnosis: MDD (major depressive disorder), recurrent severe, without psychosis (HCC)  Secondary Diagnoses: Principal Problem:   MDD (major depressive disorder), recurrent severe, without psychosis (HCC) Active Problems:   Type 2 diabetes mellitus without complication, without long-term current use of insulin  (HCC)   Alcohol use disorder, severe, dependence (HCC)   Current Medications:  Current Facility-Administered Medications  Medication Dose Route Frequency Provider Last Rate Last Admin   acetaminophen  (TYLENOL ) tablet 650 mg  650 mg Oral Q6H PRN Trudy Carwin, NP   650 mg at 02/28/24 2104   alum & mag hydroxide-simeth (MAALOX/MYLANTA) 200-200-20 MG/5ML suspension 30 mL  30 mL Oral Q4H PRN Trudy Carwin, NP       haloperidol  (HALDOL ) tablet 5 mg  5 mg Oral TID PRN Trudy Carwin, NP       And   diphenhydrAMINE  (BENADRYL ) capsule 50 mg  50 mg Oral TID PRN Trudy Carwin, NP       haloperidol  lactate (HALDOL ) injection 5 mg  5 mg Intramuscular TID PRN Trudy Carwin, NP       And   diphenhydrAMINE  (BENADRYL ) injection 50 mg  50 mg Intramuscular TID PRN Trudy Carwin, NP       And   LORazepam  (ATIVAN ) injection 2 mg  2 mg Intramuscular TID PRN Trudy Carwin, NP       haloperidol  lactate (HALDOL ) injection 10 mg  10 mg Intramuscular TID PRN Trudy Carwin, NP       And   diphenhydrAMINE  (BENADRYL ) injection 50 mg  50 mg Intramuscular TID PRN Trudy Carwin, NP       And   LORazepam  (ATIVAN ) injection 2 mg  2 mg Intramuscular TID PRN Trudy Carwin, NP       hydrOXYzine  (ATARAX ) tablet 25 mg  25 mg Oral TID PRN Pashayan, Alexander S, DO   25 mg at 02/28/24 2104   insulin  aspart (novoLOG ) injection 0-15 Units  0-15 Units Subcutaneous TID WC Pashayan, Alexander S, DO   2 Units at 02/28/24 1331   insulin  aspart (novoLOG ) injection 0-5 Units  0-5 Units  Subcutaneous QHS Pashayan, Alexander S, DO       loperamide  (IMODIUM ) capsule 2-4 mg  2-4 mg Oral PRN Pashayan, Alexander S, DO       LORazepam  (ATIVAN ) tablet 1 mg  1 mg Oral Q6H PRN Pashayan, Alexander S, DO       magnesium  hydroxide (MILK OF MAGNESIA) suspension 30 mL  30 mL Oral Daily PRN Trudy Carwin, NP       metFORMIN  (GLUCOPHAGE ) tablet 1,000 mg  1,000 mg Oral BID WC Pashayan, Alexander S, DO   1,000 mg at 02/28/24 1820   multivitamin with minerals tablet 1 tablet  1 tablet Oral Daily Pashayan, Alexander S, DO   1 tablet at 02/28/24 1331   ondansetron  (ZOFRAN -ODT) disintegrating tablet 4 mg  4 mg Oral Q6H PRN Pashayan, Alexander S, DO       sertraline  (ZOLOFT ) tablet 50 mg  50 mg Oral Daily Pashayan, Alexander S, DO   50 mg at 02/28/24 1331   thiamine  (Vitamin B-1) tablet 100 mg  100 mg Oral Daily Pashayan, Alexander S, DO       traZODone  (DESYREL ) tablet 50 mg  50 mg Oral QHS,MR X 1 Pashayan, Alexander S, DO   50 mg at 02/28/24 2104   PTA Medications: Medications Prior to Admission  Medication Sig Dispense  Refill Last Dose/Taking   albuterol  (VENTOLIN  HFA) 108 (90 Base) MCG/ACT inhaler Inhale 2 puffs into the lungs every 6 (six) hours as needed for wheezing or shortness of breath. (Patient taking differently: Inhale 2 puffs into the lungs every 4 (four) hours as needed for wheezing or shortness of breath.) 1 each 0    ketoconazole  (NIZORAL ) 2 % shampoo Apply 1 Application topically daily as needed for irritation. (Patient taking differently: Apply 1 Application topically daily as needed for irritation (SCALP).) 120 mL 0    metFORMIN  (GLUCOPHAGE ) 1000 MG tablet Take 1 tablet (1,000 mg total) by mouth 2 (two) times daily with a meal. 60 tablet 0    omeprazole  (PRILOSEC) 20 MG capsule Take 1 capsule (20 mg total) by mouth daily. (Patient not taking: Reported on 02/27/2024) 30 capsule 3    RYBELSUS  3 MG TABS Take 3 mg by mouth daily.      sertraline  (ZOLOFT ) 50 MG tablet Take 1 tablet (50 mg  total) by mouth daily. 30 tablet 0    triamcinolone  cream (KENALOG ) 0.1 % Apply 1 Application topically 2 (two) times daily. (Patient taking differently: Apply 1 Application topically 2 (two) times daily. Apply to irritated/affected areas 2 times a day) 30 g 0     Patient Stressors:    Patient Strengths:    Treatment Modalities: Medication Management, Group therapy, Case management,  1 to 1 session with clinician, Psychoeducation, Recreational therapy.   Physician Treatment Plan for Primary Diagnosis: MDD (major depressive disorder), recurrent severe, without psychosis (HCC) Long Term Goal(s): Improvement in symptoms so as ready for discharge   Short Term Goals: Ability to identify changes in lifestyle to reduce recurrence of condition will improve Ability to verbalize feelings will improve Ability to disclose and discuss suicidal ideas Ability to demonstrate self-control will improve Ability to identify and develop effective coping behaviors will improve Ability to maintain clinical measurements within normal limits will improve Compliance with prescribed medications will improve Ability to identify triggers associated with substance abuse/mental health issues will improve  Medication Management: Evaluate patient's response, side effects, and tolerance of medication regimen.  Therapeutic Interventions: 1 to 1 sessions, Unit Group sessions and Medication administration.  Evaluation of Outcomes: Not Progressing  Physician Treatment Plan for Secondary Diagnosis: Principal Problem:   MDD (major depressive disorder), recurrent severe, without psychosis (HCC) Active Problems:   Type 2 diabetes mellitus without complication, without long-term current use of insulin  (HCC)   Alcohol use disorder, severe, dependence (HCC)  Long Term Goal(s): Improvement in symptoms so as ready for discharge   Short Term Goals: Ability to identify changes in lifestyle to reduce recurrence of condition  will improve Ability to verbalize feelings will improve Ability to disclose and discuss suicidal ideas Ability to demonstrate self-control will improve Ability to identify and develop effective coping behaviors will improve Ability to maintain clinical measurements within normal limits will improve Compliance with prescribed medications will improve Ability to identify triggers associated with substance abuse/mental health issues will improve     Medication Management: Evaluate patient's response, side effects, and tolerance of medication regimen.  Therapeutic Interventions: 1 to 1 sessions, Unit Group sessions and Medication administration.  Evaluation of Outcomes: Not Progressing   RN Treatment Plan for Primary Diagnosis: MDD (major depressive disorder), recurrent severe, without psychosis (HCC) Long Term Goal(s): Knowledge of disease and therapeutic regimen to maintain health will improve  Short Term Goals: Ability to remain free from injury will improve, Ability to verbalize frustration and anger appropriately will  improve, Ability to demonstrate self-control, Ability to participate in decision making will improve, Ability to verbalize feelings will improve, Ability to disclose and discuss suicidal ideas, Ability to identify and develop effective coping behaviors will improve, and Compliance with prescribed medications will improve  Medication Management: RN will administer medications as ordered by provider, will assess and evaluate patient's response and provide education to patient for prescribed medication. RN will report any adverse and/or side effects to prescribing provider.  Therapeutic Interventions: 1 on 1 counseling sessions, Psychoeducation, Medication administration, Evaluate responses to treatment, Monitor vital signs and CBGs as ordered, Perform/monitor CIWA, COWS, AIMS and Fall Risk screenings as ordered, Perform wound care treatments as ordered.  Evaluation of Outcomes:  Not Progressing   LCSW Treatment Plan for Primary Diagnosis: MDD (major depressive disorder), recurrent severe, without psychosis (HCC) Long Term Goal(s): Safe transition to appropriate next level of care at discharge, Engage patient in therapeutic group addressing interpersonal concerns.  Short Term Goals: Engage patient in aftercare planning with referrals and resources, Increase social support, Increase ability to appropriately verbalize feelings, Increase emotional regulation, Facilitate acceptance of mental health diagnosis and concerns, Facilitate patient progression through stages of change regarding substance use diagnoses and concerns, Identify triggers associated with mental health/substance abuse issues, and Increase skills for wellness and recovery  Therapeutic Interventions: Assess for all discharge needs, 1 to 1 time with Social worker, Explore available resources and support systems, Assess for adequacy in community support network, Educate family and significant other(s) on suicide prevention, Complete Psychosocial Assessment, Interpersonal group therapy.  Evaluation of Outcomes: Not Progressing   Progress in Treatment: Attending groups:  attended some groups Participating in groups: Yes. Taking medication as prescribed: N/A Toleration medication:  patient hasn't been prescribed any medications yet Family/Significant other contact made: No, will contact:  patient declined to provide consents at this time Patient understands diagnosis: Yes. Discussing patient identified problems/goals with staff: Yes. Medical problems stabilized or resolved: Yes. Denies suicidal/homicidal ideation: Yes. Issues/concerns per patient self-inventory: No.  New problem(s) identified:  No  New Short Term/Long Term Goal(s):    medication stabilization, elimination of SI thoughts, development of comprehensive mental wellness plan.   Patient Goals:    Discharge Plan or Barriers:  Patient  recently admitted. CSW will continue to follow and assess for appropriate referrals and possible discharge planning.    Reason for Continuation of Hospitalization: Anxiety Depression Medication stabilization Suicidal ideation  Estimated Length of Stay:  5 - 7 days  Last 3 [place] Suicide Severity Risk Score: Flowsheet Row Ad[facility] (Current) [facility] from 02/27/2024 in [facility] Department at [facility] ED from 02/26/2024 in [facility] Emergency Department at [facility]  C-SSRS RISK CATEGORY Low Risk Low Risk No Risk    Last [facility] 2/9 Scores:    02/18/2023   10:32 AM 02/09/2023   11:35 AM 07/30/2021    2:27 PM  Depression screen PHQ 2/9  Decreased Interest 3 3 0  Down, Depressed, Hopeless 3 3 0  PHQ - 2 Score 6 6 0  Altered sleeping 2 3 0  Tired, decreased energy 0  0  Change in appetite 2 3 0  Feeling bad or failure about yourself  3 3 0  Trouble concentrating 2 3 0  Moving slowly or fidgety/restless 1 3 0  Suicidal thoughts 0 3 0  PHQ-9 Score 16  24  0   Difficult doing work/chores Very difficult  Not difficult  at all     Data saved with a previous flowsheet row definition    Scribe for Treatment Team: Dabria Wadas O Crystelle Ferrufino, LCSWA 02/29/2024 10:00 AM
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
## 2024-02-29 NOTE — Group Note (Signed)
 Date:  02/29/2024 Time:  10:18 AM  Group Topic/Focus:  Goals Group:   The focus of this group is to help patients establish daily goals to achieve during treatment and discuss how the patient can incorporate goal setting into their daily lives to aide in recovery. Orientation:   The focus of this group is to educate the patient on the purpose and policies of crisis stabilization and provide a format to answer questions about their admission.  The group details unit policies and expectations of patients while admitted.    Participation Level:  Did Not Attend  Grace Montgomery 02/29/2024, 10:18 AM

## 2024-03-01 LAB — GLUCOSE, CAPILLARY
Glucose-Capillary: 187 mg/dL — ABNORMAL HIGH (ref 70–99)
Glucose-Capillary: 155 mg/dL — ABNORMAL HIGH (ref 70–99)
Glucose-Capillary: 234 mg/dL — ABNORMAL HIGH (ref 70–99)
Glucose-Capillary: 158 mg/dL — ABNORMAL HIGH (ref 70–99)

## 2024-03-01 MED ORDER — BISACODYL 5 MG PO TBEC
10.0000 mg | DELAYED_RELEASE_TABLET | Freq: Once | ORAL | Status: AC
  Administered 2024-03-01: 10 mg via ORAL
  Filled 2024-03-01: qty 2

## 2024-03-01 MED ORDER — TRAZODONE HCL 100 MG PO TABS
100.0000 mg | ORAL_TABLET | Freq: Every day | ORAL | Status: DC
  Administered 2024-03-01 – 2024-03-04 (×4): 100 mg via ORAL
  Filled 2024-03-01: qty 1
  Filled 2024-03-01: qty 7
  Filled 2024-03-01 (×3): qty 1

## 2024-03-01 MED ORDER — POLYETHYLENE GLYCOL 3350 17 G PO PACK
17.0000 g | PACK | Freq: Every day | ORAL | Status: DC
  Filled 2024-03-01 (×6): qty 1

## 2024-03-01 MED ORDER — SERTRALINE HCL 50 MG PO TABS
75.0000 mg | ORAL_TABLET | Freq: Every day | ORAL | Status: DC
  Administered 2024-03-02 – 2024-03-05 (×4): 75 mg via ORAL
  Filled 2024-03-01 (×4): qty 1

## 2024-03-01 NOTE — Group Note (Signed)
 Date:  03/01/2024 Time:  9:26 PM  Group Topic/Focus:  Emotional Education:   The focus of this group is to discuss what feelings/emotions are, and how they are experienced.    Participation Level:  Active  Participation Quality:  Appropriate  Affect:  Appropriate  Cognitive:  Appropriate  Insight: Appropriate  Engagement in Group:  Engaged  Modes of Intervention:  Discussion  Additional Comments:    Grace Montgomery L 03/01/2024, 9:26 PM

## 2024-03-01 NOTE — Group Note (Signed)
 Date:  03/01/2024 Time:  9:48 AM  Group Topic/Focus:  Goals Group:   The focus of this group is to help patients establish daily goals to achieve during treatment and discuss how the patient can incorporate goal setting into their daily lives to aide in recovery.    Participation Level:  Did Not Attend   Grace Montgomery 03/01/2024, 9:48 AM

## 2024-03-01 NOTE — Group Note (Signed)
 Date:  03/01/2024 Time:  12:18 PM  Group Topic/Focus: Social Work    Pt did not attend social work group  Grace Montgomery R Kaysi Ourada 03/01/2024, 12:18 PM

## 2024-03-01 NOTE — Group Note (Signed)
 Date:  03/01/2024 Time:  4:08 PM  Group Topic/Focus:  Emotional Wellness: focused on anger is to help participants understand, manage, and express their anger in healthy ways. The group seeks to foster a supportive environment where individuals can explore the emotions behind their anger and develop tools for coping with and responding to anger constructively. Educate patients to realize that anger is often a response to other emotions (like feeling misunderstood, ignored, or helpless) and guide them in exploring those deeper feelings.     Participation Level:  Did Not Attend  Grace Montgomery R Camara Renstrom 03/01/2024, 4:08 PM
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
## 2024-03-01 NOTE — Group Note (Signed)
 LCSW Group Therapy Note   Group Date: 03/01/2024 Start Time: 1100 End Time: 1200   Participation:  did not attend  Type of Therapy:  Group Therapy  Topic:  Understanding Your Path to Change  Objective:  The goal is to help individuals understand the stages of change, identify where they currently are in the process, and provide actionable next steps to continue moving forward in their journey of change.  Goals:  Learn about the six stages of change:  Pre-contemplation, Contemplation, Preparation, Action, Maintenance, and Relapse 2.  Reflect on Current Change Efforts:  Recognize which stage participants are in regarding a personal change. 3.  Plan Next Steps for Moving Forward:  Create an action plan based on their current stage of change.  Summary:  In this session, we explored the Stages of Change as a framework to understand the process of change.  We discussed how each stage helps individuals recognize where they are in their personal journey and used the Stages of Change Worksheet for self-reflection. Participants answered questions to better understand their current stage, challenges, and progress. We also emphasized the importance of moving forward, even if setbacks (Relapse) occur, and created actionable steps to help participants continue progressing. By the end of the session, participants gained a clearer understanding of their path to change and left with a clear plan for next steps.  Therapeutic Modalities:  Elements of CBT (cognitive restructuring, problem solving)  Element of DBT (mindfulness, distress tolerance)   Grace Montgomery O Grace Montgomery, LCSWA 03/01/2024  3:14 PM

## 2024-03-01 NOTE — Progress Notes (Signed)
 Patient denies SI, HI, AVH. Patient stated they slept Fair last night. Scored 4/10 on anxiety and 8/10 on depression. Patient has been calm, cooperative, and med compliant.     03/01/24 1000  Psych Admission Type (Psych Patients Only)  Admission Status Voluntary  Psychosocial Assessment  Patient Complaints Depression  Eye Contact Fair  Facial Expression Flat  Affect Appropriate to circumstance  Speech Logical/coherent  Interaction Assertive  Motor Activity Slow  Appearance/Hygiene Unremarkable  Behavior Characteristics Cooperative;Calm  Mood Depressed;Pleasant  Thought Process  Coherency WDL  Content WDL  Delusions None reported or observed  Perception WDL  Hallucination None reported or observed  Judgment Poor  Confusion None  Danger to Self  Current suicidal ideation? Denies  Agreement Not to Harm Self Yes  Description of Agreement Verbal  Danger to Others  Danger to Others None reported or observed

## 2024-03-01 NOTE — Progress Notes (Signed)
(  Sleep Hours) - 9 hrs (Any PRNs that were needed, meds refused, or side effects to meds)- hydroxyzine (Any disturbances and when (visitation, over night)- none (Concerns raised by the patient)- none (SI/HI/AVH)- denies

## 2024-03-01 NOTE — Plan of Care (Signed)
   Problem: Education: Goal: Knowledge of [facility] Education information/materials will improve Outcome: Progressing Goal: Emotional status will improve Outcome: Progressing Goal: Mental status will improve Outcome: Progressing Goal: Verbalization of understanding the information provided will improve Outcome: Progressing   Problem: Activity: Goal: Interest or engagement in activities will improve Outcome: Progressing

## 2024-03-01 NOTE — Group Note (Addendum)
 Date:  03/01/2024 Time:  8:10 PM  Group Topic/Focus:  Dimensions of Wellness:   The focus of this group is to introduce the topic of wellness and discuss the role each dimension of wellness plays in total health. Example: How our gut flora affects our mood.     Participation Level:  Did Not Attend   Berwyn GORMAN Acosta 03/01/2024, 8:10 PM

## 2024-03-02 LAB — GLUCOSE, CAPILLARY
Glucose-Capillary: 174 mg/dL — ABNORMAL HIGH (ref 70–99)
Glucose-Capillary: 222 mg/dL — ABNORMAL HIGH (ref 70–99)
Glucose-Capillary: 124 mg/dL — ABNORMAL HIGH (ref 70–99)
Glucose-Capillary: 262 mg/dL — ABNORMAL HIGH (ref 70–99)

## 2024-03-02 NOTE — Progress Notes (Signed)
(  Sleep Hours) - 9 hrs (Any PRNs that were needed, meds refused, or side effects to meds)- hydroxyzine (Any disturbances and when (visitation, over night)- none (Concerns raised by the patient)- none (SI/HI/AVH)- denies

## 2024-03-02 NOTE — Group Note (Signed)
 Date:  03/02/2024 Time:  4:42 PM  Group Topic/Focus:  Overcoming Stress:   The focus of this group is to define stress and help patients assess their triggers. Identifying Stressors: External and Internal What Kind of Stressors Brought You in to the Hospital and What Kind of Coping Skills Can You Use to Alleviate Them?     Participation Level:  Did Not Attend   Inocente PARAS [facility] 03/02/2024, 4:42 PM

## 2024-03-02 NOTE — Plan of Care (Signed)
   Problem: Education: Goal: Knowledge of [facility] Education information/materials will improve Outcome: Progressing Goal: Emotional status will improve Outcome: Progressing Goal: Mental status will improve Outcome: Progressing Goal: Verbalization of understanding the information provided will improve Outcome: Progressing

## 2024-03-02 NOTE — Progress Notes (Signed)
 Grace Montgomery  Type of Note:  Updates  Pt was provided list of housing options (again); discussed substance use rehab facilities.  She is not wanting to pursue this option because she wants to come and go as I please.  Pt feels trapped in rehab facilities and prefers shelters as an only option.  She stated she will begin reaching out to facilities for shelter this afternoon and will update weekend CSW on progress re shelter availability and/or d/c to [facility] if she does not have any other options remaining.  Signed: DERICK BLANCH, LCSW 03/02/2024 3:16 PM
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
## 2024-03-02 NOTE — Plan of Care (Signed)
   Problem: Education: Goal: Emotional status will improve Outcome: Progressing Goal: Mental status will improve Outcome: Progressing

## 2024-03-02 NOTE — Group Note (Signed)
 Date:  03/02/2024 Time:  11:23 AM  Group Topic/Focus: Recreational Therapy Group Topic/Focus: Recreational Therapy Critical thinking in recreational therapy for mental health patients is essential for providing personalized, effective care. It enables therapists to assess each patient's unique needs, goals, and abilities, ensuring that interventions are tailored to promote emotional, cognitive, and social well-being. By critically evaluating patients' interests and challenges, therapists can select appropriate activities that align with therapeutic goals, adapt them as needed, and troubleshoot barriers to participation. Critical thinking also helps therapists track progress and make necessary adjustments to ensure activities are effective. Additionally, it encourages patients to develop self-reflection and problem-solving skills, empowering them to gain insight into their own behaviors and emotions. Overall, critical thinking in recreational therapy fosters a flexible, responsive approach that supports positive mental health outcomes and helps patients achieve greater independence and coping skills.     Participation Level:  Did Not Attend    Grace [facility] 03/02/2024, 11:23 AM

## 2024-03-02 NOTE — Group Note (Signed)
 Date:  03/02/2024 Time:  3:58 PM  Group Topic/Focus: Social Wellness Wellness Toolbox:   The focus of this group is to discuss various aspects of wellness, balancing those aspects and exploring ways to increase the ability to experience wellness.  Patients will create a wellness toolbox for use upon discharge.    Participation Level:  Active  Participation Quality:  Appropriate  Affect:  Appropriate  Cognitive:  Alert  Insight: Appropriate  Engagement in Group:  Engaged  Modes of Intervention:  Socialization  Additional Comments:    Dolores CHRISTELLA Fredericks 03/02/2024, 3:58 PM

## 2024-03-02 NOTE — [facility] Group Notes (Signed)
 Adult Psychoeducational Group Note  Date:  03/02/2024 Time:  8:23 PM  Group Topic/Focus:  Wrap-Up Group:   The focus of this group is to help patients review their daily goal of treatment and discuss progress on daily workbooks.  Participation Level:  Active  Participation Quality:  Appropriate  Affect:  Appropriate  Cognitive:  Appropriate  Insight: Appropriate  Engagement in Group:  Engaged  Modes of Intervention:  Discussion  Additional Comments:  Carola attend aa wrap up group  Lang Drilling Long 03/02/2024, 8:23 PM

## 2024-03-02 NOTE — Group Note (Signed)
 Recreation Therapy Group Note   Group Topic:Leisure Education  Group Date: 03/02/2024 Start Time: 0935 End Time: 1005 Facilitators: Mecca Barga-McCall, LRT,CTRS Location: [address] Dayroom   Group Topic: Communication, Team Building, Problem Solving  Goal Area(s) Addresses:  Patient will effectively work with peer towards shared goal.  Patient will identify skills used to make activity successful.  Patient will identify how skills used during activity can be used to reach post d/c goals.   Behavioral Response:   Intervention: STEM Activity  Activity: Straw Bridge. In teams of 3-5, patients were given 15 plastic drinking straws and an equal length of masking tape. Using the materials provided, patients were instructed to build a free standing bridge-like structure to suspend an everyday item (ex: puzzle box) off of the floor or table surface. All materials were required to be used by the team in their design. LRT facilitated post-activity discussion reviewing team process. Patients were encouraged to reflect how the skills used in this activity can be generalized to daily life post discharge.   Education: Pharmacist, Community, Scientist, Physiological, Discharge Planning   Education Outcome: Acknowledges education/In group clarification offered/Needs additional education.    Affect/Mood: N/A   Participation Level: Did not attend    Clinical Observations/Individualized Feedback:      Plan: Continue to engage patient in RT group sessions 2-3x/week.   Joshwa Hemric-McCall, LRT,CTRS 03/02/2024 11:33 AM

## 2024-03-02 NOTE — Group Note (Signed)
 Date:  03/02/2024 Time:  10:51 AM  Group Topic/Focus: goal and orientation Goals Group:   The focus of this group is to help patients establish daily goals to achieve during treatment and discuss how the patient can incorporate goal setting into their daily lives to aide in recovery. Orientation:   The focus of this group is to educate the patient on the purpose and policies of crisis stabilization and provide a format to answer questions about their admission.  The group details unit policies and expectations of patients while admitted.    Participation Level:  Did Not Attend   Grace [facility] 03/02/2024, 10:51 AM

## 2024-03-02 NOTE — Progress Notes (Signed)
" °   03/02/24 0900  Psych Admission Type (Psych Patients Only)  Admission Status Voluntary  Psychosocial Assessment  Patient Complaints Depression  Eye Contact Fair  Facial Expression Flat  Affect Appropriate to circumstance  Speech Logical/coherent  Interaction Assertive  Motor Activity Slow  Appearance/Hygiene Unremarkable  Behavior Characteristics Unwilling to participate  Mood Depressed  Thought Process  Coherency WDL  Content WDL  Delusions None reported or observed  Perception WDL  Hallucination None reported or observed  Judgment Poor  Confusion None  Danger to Self  Current suicidal ideation? Denies  Danger to Others  Danger to Others None reported or observed    "

## 2024-03-03 LAB — GLUCOSE, CAPILLARY
Glucose-Capillary: 171 mg/dL — ABNORMAL HIGH (ref 70–99)
Glucose-Capillary: 227 mg/dL — ABNORMAL HIGH (ref 70–99)
Glucose-Capillary: 284 mg/dL — ABNORMAL HIGH (ref 70–99)
Glucose-Capillary: 151 mg/dL — ABNORMAL HIGH (ref 70–99)

## 2024-03-03 MED ORDER — BISACODYL 10 MG RE SUPP: 10.0000 mg | Freq: Once | RECTAL | Status: DC

## 2024-03-03 NOTE — Group Note (Signed)
 Date:  03/03/2024 Time:  6:28 PM  Group Topic/Focus:  Emotional Education:   The focus of this group is to discuss what feelings/emotions are, and how they are experienced.    Participation Level:  Active  Participation Quality:  Appropriate  Affect:  Appropriate  Cognitive:  Appropriate  Insight: Appropriate  Engagement in Group:  Engaged  Modes of Intervention:  Activity  Additional Comments:    Grace [facility] 03/03/2024, 6:28 PM

## 2024-03-03 NOTE — Group Note (Unsigned)
 Date:  03/03/2024 Time:  8:46 PM  Group Topic/Focus:  Wrap-Up Group:   The focus of this group is to help patients review their daily goal of treatment and discuss progress on daily workbooks.     Participation Level:  {B[facility] PARTICIPATION OZCZO:77735}  Participation Quality:  {B[facility] PARTICIPATION QUALITY:22265}  Affect:  {B[facility] AFFECT:22266}  Cognitive:  {B[facility] COGNITIVE:22267}  Insight: {BHH Insight2:20797}  Engagement in Group:  {B[facility] ENGAGEMENT IN HMNLE:77731}  Modes of Intervention:  {BHH MODES OF INTERVENTION:22269}  Additional Comments:  ***  Gwenn Nobie [place] 03/03/2024, 8:46 PM
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
## 2024-03-03 NOTE — Progress Notes (Signed)
(  Sleep Hours) - (Any PRNs that were needed, meds refused, or side effects to meds)- PRN Hydroxyzine   (Any disturbances and when (visitation, over night)- None (Concerns raised by the patient)-  None (SI/HI/AVH)- Denies. Contracts for safety

## 2024-03-03 NOTE — Progress Notes (Signed)
(  Sleep Hours) -7.75 (Any PRNs that were needed, meds refused, or side effects to meds)- Atarax , and Trazodone   (Any disturbances and when (visitation, over night)-none (Concerns raised by the patient)- none (SI/HI/AVH)-denied

## 2024-03-03 NOTE — Plan of Care (Signed)
" °  Problem: Coping: Goal: Ability to verbalize frustrations and anger appropriately will improve Outcome: Progressing   Problem: Coping: Goal: Ability to adjust to condition or change in health will improve Outcome: Progressing   "

## 2024-03-03 NOTE — Group Note (Unsigned)
 Date:  03/03/2024 Time:  4:00 PM  Group Topic/Focus:  Nurse group     Participation Level:  {BHH PARTICIPATION OZCZO:77735}  Participation Quality:  {BHH PARTICIPATION QUALITY:22265}  Affect:  {BHH AFFECT:22266}  Cognitive:  {BHH COGNITIVE:22267}  Insight: {BHH Insight2:20797}  Engagement in Group:  {BHH ENGAGEMENT IN HMNLE:77731}  Modes of Intervention:  {BHH MODES OF INTERVENTION:22269}  Additional Comments:  ***  Christl Fessenden L Sabrina Arriaga 03/03/2024, 4:00 PM

## 2024-03-03 NOTE — Plan of Care (Signed)
   Problem: Safety: Goal: Periods of time without injury will increase Outcome: Progressing

## 2024-03-03 NOTE — BHH Group Notes (Signed)
 BHH Group Notes:  (Nursing/MHT/Case Management/Adjunct)  Date:  03/03/2024  Time:  2000  Type of Therapy:  Wrap  up group  Participation Level:  Active  Participation Quality:  Resistant and Sharing  Affect:  Irritable and Resistant  Cognitive:  Alert  Insight:  Appropriate  Engagement in Group:  Limited  Modes of Intervention:  Clarification, Education, and Support  Summary of Progress/Problems: Positive thinking and positive change were discussed.   Lenora Manuelita RAMAN 03/03/2024, 8:47 PM

## 2024-03-03 NOTE — Group Note (Signed)
 LCSW Group Therapy Note 03/03/2024  10:00am - 11:00am  Type of Therapy and Topic:  Group Therapy: Slogans/Quotes To Live By  Participation Level:  Did Not Attend  Description of Group: In this group, patients identified a slogan or saying that they use often in their lives and discussed how it helps them.  Other group members reacted and talked about whether that will be something they can add to their repertoire.  CSW then shared a variety of inspiring and/or humorous quotes to be an encouragement.  These were discussed, particularly how they can increase feelings of having power and a voice in their own lives.  The topic of boundaries was raised numerous times which led to an exploration of the concept of having boundaries with ourselves.  We talked at length about the importance of deciding on boundaries, not just guessing, and of telling the other people involved what the boundaries are so that they can have a chance to observe them.  Therapeutic Goals: Patients will share a positive quote or saying that helps them manage hard times. Patients will identify how the quotes shared in group by themselves and other could help them to succeed upon discharge Patients will be able to acknowledge the common theme of powerlessness among the group members Patients will consider ways to take back their power where and when they are able, especially by establishing appropriate boundaries Patients will learn about deciding on boundaries, telling the other person about them, implementing them, then enforcing them if necessary.  Summary of Patient Progress:  Patient was invited to group, did not attend.   Therapeutic Modalities:   Processing  Grace JINNY Crest, LCSW 03/03/2024  1:25 PM

## 2024-03-03 NOTE — Plan of Care (Signed)
  Problem: Education: Goal: Mental status will improve Outcome: Progressing   

## 2024-03-03 NOTE — Group Note (Signed)
 Date:  03/03/2024 Time:  4:07 PM  Group Topic/Focus:  Wellness Toolbox:   The focus of this group is to discuss various aspects of wellness, balancing those aspects and exploring ways to increase the ability to experience wellness.  Patients will create a wellness toolbox for use upon discharge. Nursing Group   Participation Level:  Active  Grace Montgomery 03/03/2024, 4:07 PM

## 2024-03-04 LAB — LIPID PANEL
Cholesterol: 180 mg/dL (ref 0–200)
HDL: 72 mg/dL
LDL Cholesterol: 68 mg/dL (ref 0–99)
Total CHOL/HDL Ratio: 2.5 ratio
Triglycerides: 199 mg/dL — ABNORMAL HIGH
VLDL: 40 mg/dL (ref 0–40)

## 2024-03-04 LAB — HEMOGLOBIN A1C
Hgb A1c MFr Bld: 9.2 % — ABNORMAL HIGH (ref 4.8–5.6)
Mean Plasma Glucose: 217.34 mg/dL

## 2024-03-04 LAB — GLUCOSE, CAPILLARY
Glucose-Capillary: 165 mg/dL — ABNORMAL HIGH (ref 70–99)
Glucose-Capillary: 166 mg/dL — ABNORMAL HIGH (ref 70–99)
Glucose-Capillary: 222 mg/dL — ABNORMAL HIGH (ref 70–99)
Glucose-Capillary: 185 mg/dL — ABNORMAL HIGH (ref 70–99)

## 2024-03-04 NOTE — Plan of Care (Signed)
 ?  Problem: Education: ?Goal: Emotional status will improve ?Outcome: Progressing ?Goal: Mental status will improve ?Outcome: Progressing ?Goal: Verbalization of understanding the information provided will improve ?Outcome: Progressing ?  ?Problem: Activity: ?Goal: Sleeping patterns will improve ?Outcome: Progressing ?  ?

## 2024-03-04 NOTE — Plan of Care (Signed)

## 2024-03-04 NOTE — Progress Notes (Signed)
 Patient signed 72 hour request for discharge 03/04/24 @ 1640.

## 2024-03-04 NOTE — Group Note (Signed)
 Date:  03/04/2024 Time:  8:31 PM  Group Topic/Focus:  Wrap-Up Group:   The focus of this group is to help patients review their daily goal of treatment and discuss progress on daily workbooks.    Participation Level:  Active  Participation Quality:  Appropriate and Sharing  Affect:  Appropriate  Cognitive:  Appropriate  Insight: Appropriate and Limited  Engagement in Group:  Distracting, Engaged, and Limited  Modes of Intervention:  Activity and Socialization  Additional Comments:  Patient completed wrap up group sheet. Patient stated that she is feel, great right now. Patient rated her day a 10. Patient high point from today, I got rest and low point from today, big back tried to fight.   Eward Mace 03/04/2024, 8:31 PM

## 2024-03-04 NOTE — Progress Notes (Signed)
" °   03/04/24 1100  Psych Admission Type (Psych Patients Only)  Admission Status Voluntary  Psychosocial Assessment  Patient Complaints Anxiety;Depression  Eye Contact Fair  Facial Expression Flat  Affect Appropriate to circumstance  Speech Logical/coherent  Interaction Assertive  Motor Activity Slow  Appearance/Hygiene Unremarkable  Behavior Characteristics Cooperative  Mood Depressed  Thought Process  Coherency WDL  Content WDL  Delusions None reported or observed  Perception WDL  Hallucination None reported or observed  Judgment Poor  Confusion None  Danger to Self  Current suicidal ideation? Denies  Agreement Not to Harm Self Yes  Description of Agreement verbal  Danger to Others  Danger to Others None reported or observed    "
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
## 2024-03-04 NOTE — Progress Notes (Signed)
(  Sleep Hours) -6.25  (Any PRNs that were needed, meds refused, or side effects to meds)- none  (Any disturbances and when (visitation, over night)-none  (Concerns raised by the patient)- none  (SI/HI/AVH)- denies

## 2024-03-05 ENCOUNTER — Encounter (HOSPITAL_COMMUNITY): Payer: Self-pay

## 2024-03-05 LAB — GLUCOSE, CAPILLARY: Glucose-Capillary: 167 mg/dL — ABNORMAL HIGH (ref 70–99)

## 2024-03-05 MED ORDER — TRAZODONE HCL 100 MG PO TABS: 100.0000 mg | ORAL_TABLET | Freq: Every day | ORAL | 0 refills | Status: AC

## 2024-03-05 MED ORDER — METFORMIN HCL 1000 MG PO TABS: 1000.0000 mg | ORAL_TABLET | Freq: Two times a day (BID) | ORAL | 0 refills | Status: DC

## 2024-03-05 MED ORDER — SERTRALINE HCL 25 MG PO TABS
75.0000 mg | ORAL_TABLET | Freq: Every day | ORAL | Status: DC
  Filled 2024-03-05: qty 3

## 2024-03-05 MED ORDER — POLYETHYLENE GLYCOL 3350 17 G PO PACK: 17.0000 g | PACK | Freq: Every day | ORAL | Status: AC

## 2024-03-05 MED ORDER — ADULT MULTIVITAMIN W/MINERALS CH: 1.0000 | ORAL_TABLET | Freq: Every day | ORAL | Status: AC

## 2024-03-05 MED ORDER — SERTRALINE HCL 25 MG PO TABS: 75.0000 mg | ORAL_TABLET | Freq: Every day | ORAL | 0 refills | Status: AC

## 2024-03-05 MED ORDER — HYDROXYZINE HCL 25 MG PO TABS: 25.0000 mg | ORAL_TABLET | Freq: Three times a day (TID) | ORAL | 0 refills | Status: AC | PRN

## 2024-03-05 NOTE — Group Note (Signed)
 Date:  03/05/2024 Time:  11:50 AM  Group Topic/Focus: Gut flora's impact on emotionally wellness. Making Healthy Choices:   The focus of this group is to help patients identify negative/unhealthy choices they were using prior to admission and identify positive/healthier coping strategies to replace them upon discharge.    Participation Level:  Active  Participation Quality:  Appropriate  Affect:  Appropriate  Cognitive:  Appropriate  Insight: Appropriate  Engagement in Group:  Engaged  Modes of Intervention:  Discussion and Education  Additional Comments:    Juliene CHRISTELLA Huddle 03/05/2024, 11:50 AM

## 2024-03-05 NOTE — Progress Notes (Signed)
 Discharge Note:  Patient denies SI/HI/AVH at this time. Discharge instructions, AVS, prescriptions, and transition record gone over with patient. Patient agrees to comply with medication management, follow-up visit, and outpatient therapy. Patient belongings returned to patient. Patient questions and concerns addressed and answered. Patient ambulatory off unit. Patient discharged to [facility].

## 2024-03-05 NOTE — BH IP Treatment Plan (Signed)
 Interdisciplinary Treatment and Diagnostic Plan Update  03/05/2024 Time of Session: 0850 Grace Montgomery MRN: [id number]  Principal Diagnosis: MDD (major depressive disorder), recurrent severe, without psychosis (HCC)  Secondary Diagnoses: Principal Problem:   MDD (major depressive disorder), recurrent severe, without psychosis (HCC) Active Problems:   Type 2 diabetes mellitus without complication, without long-term current use of insulin  (HCC)   Alcohol use disorder, severe, dependence (HCC)   Current Medications:  Current Facility-Administered Medications  Medication Dose Route Frequency Provider Last Rate Last Admin   acetaminophen  (TYLENOL ) tablet 650 mg  650 mg Oral Q6H PRN Trudy Carwin, NP   650 mg at 03/03/24 1024   alum & mag hydroxide-simeth (MAALOX/MYLANTA) 200-200-20 MG/5ML suspension 30 mL  30 mL Oral Q4H PRN Trudy Carwin, NP       bisacodyl  (DULCOLAX) suppository 10 mg  10 mg Rectal Once Nwoko, Agnes I, NP       haloperidol  (HALDOL ) tablet 5 mg  5 mg Oral TID PRN Trudy Carwin, NP       And   diphenhydrAMINE  (BENADRYL ) capsule 50 mg  50 mg Oral TID PRN Trudy Carwin, NP       haloperidol  lactate (HALDOL ) injection 5 mg  5 mg Intramuscular TID PRN Trudy Carwin, NP       And   diphenhydrAMINE  (BENADRYL ) injection 50 mg  50 mg Intramuscular TID PRN Trudy Carwin, NP       And   LORazepam  (ATIVAN ) injection 2 mg  2 mg Intramuscular TID PRN Trudy Carwin, NP       haloperidol  lactate (HALDOL ) injection 10 mg  10 mg Intramuscular TID PRN Trudy Carwin, NP       And   diphenhydrAMINE  (BENADRYL ) injection 50 mg  50 mg Intramuscular TID PRN Trudy Carwin, NP       And   LORazepam  (ATIVAN ) injection 2 mg  2 mg Intramuscular TID PRN Trudy Carwin, NP       hydrOXYzine  (ATARAX ) tablet 25 mg  25 mg Oral TID PRN Pashayan, Alexander S, DO   25 mg at 03/04/24 2105   insulin  aspart (novoLOG ) injection 0-15 Units  0-15 Units Subcutaneous TID WC Pashayan, Alexander S, DO   3 Units at  03/05/24 0848   insulin  aspart (novoLOG ) injection 0-5 Units  0-5 Units Subcutaneous QHS Pashayan, Alexander S, DO   3 Units at 03/02/24 2149   magnesium  hydroxide (MILK OF MAGNESIA) suspension 30 mL  30 mL Oral Daily PRN Trudy Carwin, NP       metFORMIN  (GLUCOPHAGE ) tablet 1,000 mg  1,000 mg Oral BID WC Pashayan, Alexander S, DO   1,000 mg at 03/05/24 9155   multivitamin with minerals tablet 1 tablet  1 tablet Oral Daily Pashayan, Alexander S, DO   1 tablet at 03/05/24 0844   polyethylene glycol (MIRALAX  / GLYCOLAX ) packet 17 g  17 g Oral Daily Nwoko, Mac I, NP       sertraline  (ZOLOFT ) tablet 75 mg  75 mg Oral Daily Nwoko, Agnes I, NP   75 mg at 03/05/24 0844   thiamine  (Vitamin B-1) tablet 100 mg  100 mg Oral Daily Pashayan, Alexander S, DO   100 mg at 03/05/24 9155   traZODone  (DESYREL ) tablet 100 mg  100 mg Oral QHS Collene Mac I, NP   100 mg at 03/04/24 2105   PTA Medications: Medications Prior to Admission  Medication Sig Dispense Refill Last Dose/Taking   albuterol  (VENTOLIN  HFA) 108 (90 Base) MCG/ACT inhaler Inhale 2 puffs into  the lungs every 6 (six) hours as needed for wheezing or shortness of breath. (Patient taking differently: Inhale 2 puffs into the lungs every 4 (four) hours as needed for wheezing or shortness of breath.) 1 each 0    ketoconazole  (NIZORAL ) 2 % shampoo Apply 1 Application topically daily as needed for irritation. (Patient taking differently: Apply 1 Application topically daily as needed for irritation (SCALP).) 120 mL 0    metFORMIN  (GLUCOPHAGE ) 1000 MG tablet Take 1 tablet (1,000 mg total) by mouth 2 (two) times daily with a meal. 60 tablet 0    omeprazole  (PRILOSEC) 20 MG capsule Take 1 capsule (20 mg total) by mouth daily. (Patient not taking: Reported on 02/27/2024) 30 capsule 3    RYBELSUS  3 MG TABS Take 3 mg by mouth daily.      sertraline  (ZOLOFT ) 50 MG tablet Take 1 tablet (50 mg total) by mouth daily. 30 tablet 0    triamcinolone  cream (KENALOG ) 0.1 %  Apply 1 Application topically 2 (two) times daily. (Patient taking differently: Apply 1 Application topically 2 (two) times daily. Apply to irritated/affected areas 2 times a day) 30 g 0     Patient Stressors:    Patient Strengths:    Treatment Modalities: Medication Management, Group therapy, Case management,  1 to 1 session with clinician, Psychoeducation, Recreational therapy.   Physician Treatment Plan for Primary Diagnosis: MDD (major depressive disorder), recurrent severe, without psychosis (HCC) Long Term Goal(s): Improvement in symptoms so as ready for discharge   Short Term Goals: Ability to identify changes in lifestyle to reduce recurrence of condition will improve Ability to verbalize feelings will improve Ability to disclose and discuss suicidal ideas Ability to demonstrate self-control will improve Ability to identify and develop effective coping behaviors will improve Ability to maintain clinical measurements within normal limits will improve Compliance with prescribed medications will improve Ability to identify triggers associated with substance abuse/mental health issues will improve  Medication Management: Evaluate patient's response, side effects, and tolerance of medication regimen.  Therapeutic Interventions: 1 to 1 sessions, Unit Group sessions and Medication administration.  Evaluation of Outcomes: Progressing  Physician Treatment Plan for Secondary Diagnosis: Principal Problem:   MDD (major depressive disorder), recurrent severe, without psychosis (HCC) Active Problems:   Type 2 diabetes mellitus without complication, without long-term current use of insulin  (HCC)   Alcohol use disorder, severe, dependence (HCC)  Long Term Goal(s): Improvement in symptoms so as ready for discharge   Short Term Goals: Ability to identify changes in lifestyle to reduce recurrence of condition will improve Ability to verbalize feelings will improve Ability to disclose and  discuss suicidal ideas Ability to demonstrate self-control will improve Ability to identify and develop effective coping behaviors will improve Ability to maintain clinical measurements within normal limits will improve Compliance with prescribed medications will improve Ability to identify triggers associated with substance abuse/mental health issues will improve     Medication Management: Evaluate patient's response, side effects, and tolerance of medication regimen.  Therapeutic Interventions: 1 to 1 sessions, Unit Group sessions and Medication administration.  Evaluation of Outcomes: Progressing   RN Treatment Plan for Primary Diagnosis: MDD (major depressive disorder), recurrent severe, without psychosis (HCC) Long Term Goal(s): Knowledge of disease and therapeutic regimen to maintain health will improve  Short Term Goals: Ability to remain free from injury will improve, Ability to demonstrate self-control, Ability to participate in decision making will improve, Ability to verbalize feelings will improve, and Ability to identify and develop effective coping behaviors  will improve  Medication Management: RN will administer medications as ordered by provider, will assess and evaluate patient's response and provide education to patient for prescribed medication. RN will report any adverse and/or side effects to prescribing provider.  Therapeutic Interventions: 1 on 1 counseling sessions, Psychoeducation, Medication administration, Evaluate responses to treatment, Monitor vital signs and CBGs as ordered, Perform/monitor CIWA, COWS, AIMS and Fall Risk screenings as ordered, Perform wound care treatments as ordered.  Evaluation of Outcomes: Progressing   LCSW Treatment Plan for Primary Diagnosis: MDD (major depressive disorder), recurrent severe, without psychosis (HCC) Long Term Goal(s): Safe transition to appropriate next level of care at discharge, Engage patient in therapeutic group  addressing interpersonal concerns.  Short Term Goals: Engage patient in aftercare planning with referrals and resources, Increase social support, Increase ability to appropriately verbalize feelings, Increase emotional regulation, Facilitate acceptance of mental health diagnosis and concerns, and Increase skills for wellness and recovery  Therapeutic Interventions: Assess for all discharge needs, 1 to 1 time with Social worker, Explore available resources and support systems, Assess for adequacy in community support network, Educate family and significant other(s) on suicide prevention, Complete Psychosocial Assessment, Interpersonal group therapy.  Evaluation of Outcomes: Progressing   Progress in Treatment: Attending groups: No. Participating in groups: No. Taking medication as prescribed: Yes. Toleration medication: Yes. Family/Significant other contact made: No, will contact:  pt declined consents Patient understands diagnosis: Yes. Discussing patient identified problems/goals with staff: Yes. Medical problems stabilized or resolved: Yes. Denies suicidal/homicidal ideation: Yes. Issues/concerns per patient self-inventory: No.   New problem(s) identified: No, Describe:  none   New Short Term/Long Term Goal(s): detox, medication management for mood stabilization; elimination of SI thoughts; development of comprehensive mental wellness/sobriety plan     Patient Goals:  Medication and a place to go when I discharge   Discharge Plan or Barriers: Patient recently admitted. CSW will continue to follow and assess for appropriate referrals and possible discharge planning.      Reason for Continuation of Hospitalization: Depression Medication stabilization Suicidal ideation   Estimated Length of Stay: 5-7 days Last 3 [place] Suicide Severity Risk Score: Flowsheet Row Ad[facility] (Current) [facility] from [facility]27/2024 in [facility] at [facility] ED from 02/26/2024 in [facility] Department at [facility]  C-SSRS RISK CATEGORY Low Risk Low Risk No Risk    Last [facility] 2/9 Scores:    02/18/2023   10:32 AM 02/09/2023   11:35 AM 07/30/2021    2:27 PM  Depression screen PHQ 2/9  Decreased Interest 3 3 0  Down, Depressed, Hopeless 3 3 0  PHQ - 2 Score 6 6 0  Altered sleeping 2 3 0  Tired, decreased energy 0  0  Change in appetite 2 3 0  Feeling bad or failure about yourself  3 3 0  Trouble concentrating 2 3 0  Moving slowly or fidgety/restless 1 3 0  Suicidal thoughts 0 3 0  PHQ-9 Score 16  24  0   Difficult doing work/chores Very difficult  Not difficult at all     Data saved with a previous flowsheet row definition    Scribe for Treatment Team: Derick JONELLE Blanch, LCSW 03/05/2024 8:53 AM

## 2024-03-05 NOTE — Progress Notes (Signed)
" °  [facility] Adult Case Management Discharge Plan :  Will you be returning to the same living situation after discharge:  Yes,  Pt will return to [facility]. At discharge, do you have transportation home?: Yes,  Pt will be transported via [facility] taxi. Do you have the ability to pay for your medications: Yes,  Pt has medical insurance.  Release of information consent forms completed and in the chart;  Patient's signature needed at discharge.  Patient to Follow up at:  Follow-up Information     [facility] Follow up on 03/15/2024.   Why: You have a hospital follow up appointment for therapy and medication management services on  03/15/24 at 8:00 am .  The appointment will be Virtual telehealth. Contact information: [address]  [address] [place] [address] [phone]                 Next level of care provider has access to [facility] Link:no  Safety Planning and Suicide Prevention discussed: Yes,  Completed with pt upon discharge.     Has patient been referred to the Quitline?: Patient refused referral for treatment  Patient has been referred for addiction treatment: Patient refused referral for treatment.  Derick JONELLE Blanch, LCSW 03/05/2024, 10:51 AM "

## 2024-03-05 NOTE — Progress Notes (Signed)
" °   03/05/24 0900  Psych Admission Type (Psych Patients Only)  Admission Status Voluntary/72 hour document signed  Date 72 hour document signed  03/04/24  Time 72 hour document signed  1640  Psychosocial Assessment  Patient Complaints Anxiety  Eye Contact Fair  Facial Expression Animated  Affect Appropriate to circumstance  Speech Logical/coherent  Interaction Assertive  Motor Activity Slow  Appearance/Hygiene Unremarkable  Behavior Characteristics Cooperative  Mood Anxious  Thought Process  Coherency WDL  Content WDL  Delusions None reported or observed  Perception WDL  Hallucination None reported or observed  Judgment Poor  Confusion None  Danger to Self  Current suicidal ideation? Denies  Agreement Not to Harm Self Yes  Description of Agreement verbal  Danger to Others  Danger to Others None reported or observed    "
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
## 2024-03-05 NOTE — Group Note (Signed)
 Date:  03/05/2024 Time:  9:51 AM  Group Topic/Focus: Recreational Therapy Exercise and music are both powerful tools for enhancing mental health. Physical activity releases endorphins, which help reduce stress, anxiety, and depression while boosting mood and energy levels. Regular exercise also improves sleep quality, enhances cognitive function, and increases self-esteem, making it an effective way to build emotional resilience. On the other hand, music has the ability to lift your spirits, calm your mind, and provide an emotional outlet. Whether through relaxing sounds or energetic beats, music helps regulate mood, reduce stress, and even foster social connections. When combined, exercise and music create a dynamic mental health therapy routine, offering both physical benefits and emotional healing. Whether its dancing to your favorite songs, using calming music during a yoga session, or motivating yourself with an upbeat playlist for a workout, integrating both can significantly enhance overall well-being.    Participation Level:  Active   Grace Montgomery 03/05/2024, 9:51 AM

## 2024-03-05 NOTE — Group Note (Signed)
 Date:  03/05/2024 Time:  9:22 AM  Group Topic/Focus: goals and orientation Goals Group:   The focus of this group is to help patients establish daily goals to achieve during treatment and discuss how the patient can incorporate goal setting into their daily lives to aide in recovery. Orientation:   The focus of this group is to educate the patient on the purpose and policies of crisis stabilization and provide a format to answer questions about their admission.  The group details unit policies and expectations of patients while admitted.    Participation Level:  Active  Participation Quality:  Appropriate  Affect:  Appropriate  Cognitive:  Alert  Insight: Appropriate  Engagement in Group:  Engaged  Modes of Intervention:  Orientation  Additional Comments:    Grace Montgomery 03/05/2024, 9:22 AM

## 2024-03-05 NOTE — [facility] Suicide Risk Assessment (Signed)
 Suicide Risk Assessment  Discharge Assessment    [facility] Discharge Suicide Risk Assessment   Principal Problem: MDD (major depressive disorder), recurrent severe, without psychosis (HCC) Discharge Diagnoses: Principal Problem:   MDD (major depressive disorder), recurrent severe, without psychosis (HCC) Active Problems:   Type 2 diabetes mellitus without complication, without long-term current use of insulin  (HCC)   Alcohol use disorder, severe, dependence (HCC)   Total Time spent with patient: 30 minutes  Musculoskeletal: Strength & Muscle Tone: within normal limits Gait & Station: normal Patient leans: N/A  Psychiatric Specialty Exam  Presentation  General Appearance:  Appropriate for Environment  Eye Contact: Good  Speech: Clear and Coherent; Normal Rate  Speech Volume: Normal  Handedness: Right   Mood and Affect  Mood: Euthymic  Duration of Depression Symptoms: Greater than two weeks  Affect: Appropriate; Full Range   Thought Process  Thought Processes: Coherent; Linear  Descriptions of Associations:Intact  Orientation:Full (Time, Place and Person)  Thought Content:Logical  History of Schizophrenia/Schizoaffective disorder:No  Duration of Psychotic Symptoms:No data recorded Hallucinations:Hallucinations: None  Ideas of Reference:None  Suicidal Thoughts:Suicidal Thoughts: No SI Passive Intent and/or Plan: -- (Denies)  Homicidal Thoughts:Homicidal Thoughts: No   Sensorium  Memory: Immediate Good; Recent Good; Remote Good  Judgment: Intact  Insight: Present   Executive Functions  Concentration: Good  Attention Span: Good  Recall: Good  Fund of Knowledge: Good  Language: Good   Psychomotor Activity  Psychomotor Activity:Psychomotor Activity: Normal   Assets  Assets: Communication Skills; Desire for Improvement; Resilience; Social Support   Sleep  Sleep:Sleep: Good  Estimated Sleeping Duration (Last 24 Hours):  5.75-7.25 hours  Physical Exam: Physical Exam ROS Blood pressure 133/80, pulse 90, temperature 98.2 F (36.8 C), temperature source Oral, resp. rate 18, height 5' 3 (1.6 m), weight 108.4 kg, SpO2 99%. Body mass index is 42.34 kg/m.  Mental Status Per Nursing Assessment::   On Admission:  NA  Demographic Factors:  Low socioeconomic status  Loss Factors: Financial problems/change in socioeconomic status  Historical Factors: Impulsivity  Risk Reduction Factors:   Responsible for children under 69 years of age, Sense of responsibility to family, Religious beliefs about death, Positive social support, Positive therapeutic relationship, and Positive coping skills or problem solving skills  Continued Clinical Symptoms:  More than one psychiatric diagnosis Previous Psychiatric Diagnoses and Treatments Medical Diagnoses and Treatments/Surgeries  Cognitive Features That Contribute To Risk:  None    Suicide Risk:  Minimal: No current suicidal thoughts.  No current homicidal thoughts.  No current thoughts of violence.  Modifiable risk factor targeted during this admission as mood symptoms.  Patient is stable on her current regimen.  No evidence of mania.  No evidence of psychosis.  No evidence of dissociation.  No new psychosocial stressor.  Patient is stable for care at the lower setting.    Follow-up Information     [facility] Follow up on 03/15/2024.   Why: You have a hospital follow up appointment for therapy and medication management services on  03/15/24 at 8:00 am .  The appointment will be Virtual telehealth. Contact information: [address]  [address] [place] [address] [phone]                 Plan Of Care/Follow-up recommendations:  See discharge summary.   Blair Chiquita Hint, NP 03/05/2024, 11:37 AM

## 2024-03-05 NOTE — Group Note (Signed)
 Recreation Therapy Group Note   Group Topic:Health and Wellness  Group Date: 03/05/2024 Start Time: 0935 End Time: 1000 Facilitators: Melyssa Signor-McCall, LRT,CTRS Location: [address] Dayroom   Group Topic: Exercise/Wellness  Goal Area(s) Addresses:  Patient will verbalize benefit of exercise during group session. Patient will identify an exercise that can be completed post d/c. Patient will acknowledge benefits of exercise when used as a coping mechanism.   Behavioral Response: Observed  Intervention: Music  Activity: LRT and patients were to complete 3 rounds of exercises. Each patient took turns leading the group in the exercises of their choosing. Patients could take breaks or get water  if needed.  Education: Physical Activity, Health and Wellness  Education Outcome: Acknowledges understanding/In group clarification offered/Needs additional education.    Affect/Mood: Appropriate   Participation Level: None   Participation Quality: None   Behavior: Attentive  and Distracted   Speech/Thought Process: Relevant   Insight: None   Judgement: None   Modes of Intervention: Music   Patient Response to Interventions:  Attentive   Education Outcome:  In group clarification offered    Clinical Observations/Individualized Feedback: Pt started off focused more on talking to her provider about discharge. Pt later became attentive once pt spoke with provider. Pt didn't participate in the exercises but was singing along to the songs that played.     Plan: Continue to engage patient in RT group sessions 2-3x/week.   Dallyn Bergland-McCall, LRT,CTRS 03/05/2024 11:47 AM

## 2024-03-12 ENCOUNTER — Encounter (HOSPITAL_COMMUNITY): Payer: Self-pay

## 2024-03-12 ENCOUNTER — Emergency Department (HOSPITAL_COMMUNITY)
Admission: EM | Admit: 2024-03-12 | Discharge: 2024-03-13 | Disposition: A | Discharge status: Home / Self Care | Payer: MEDICAID | Attending: Emergency Medicine | Admitting: Emergency Medicine

## 2024-03-12 ENCOUNTER — Other Ambulatory Visit: Payer: Self-pay

## 2024-03-12 DIAGNOSIS — E119 Type 2 diabetes mellitus without complications: Secondary | ICD-10-CM | POA: Diagnosis not present

## 2024-03-12 DIAGNOSIS — J45909 Unspecified asthma, uncomplicated: Secondary | ICD-10-CM | POA: Insufficient documentation

## 2024-03-12 DIAGNOSIS — Z91040 Latex allergy status: Secondary | ICD-10-CM | POA: Insufficient documentation

## 2024-03-12 DIAGNOSIS — Z7984 Long term (current) use of oral hypoglycemic drugs: Secondary | ICD-10-CM | POA: Insufficient documentation

## 2024-03-12 DIAGNOSIS — Z87891 Personal history of nicotine dependence: Secondary | ICD-10-CM | POA: Diagnosis not present

## 2024-03-12 DIAGNOSIS — R059 Cough, unspecified: Secondary | ICD-10-CM | POA: Diagnosis present

## 2024-03-12 DIAGNOSIS — J069 Acute upper respiratory infection, unspecified: Secondary | ICD-10-CM | POA: Diagnosis not present

## 2024-03-12 DIAGNOSIS — E871 Hypo-osmolality and hyponatremia: Secondary | ICD-10-CM | POA: Insufficient documentation

## 2024-03-12 MED ORDER — ALBUTEROL SULFATE (2.5 MG/3ML) 0.083% IN NEBU
5.0000 mg | INHALATION_SOLUTION | Freq: Once | RESPIRATORY_TRACT | Status: AC
  Administered 2024-03-13: 5 mg via RESPIRATORY_TRACT
  Filled 2024-03-12: qty 6

## 2024-03-12 MED ORDER — IPRATROPIUM BROMIDE 0.02 % IN SOLN
0.5000 mg | Freq: Once | RESPIRATORY_TRACT | Status: AC
  Administered 2024-03-13: 0.5 mg via RESPIRATORY_TRACT
  Filled 2024-03-12: qty 2.5

## 2024-03-12 NOTE — ED Triage Notes (Signed)
 Pt arrived from home via POV c/o headache, nasal congestion, cough, and sore throat that began yesterday. Headache 10/10

## 2024-03-12 NOTE — ED Provider Triage Note (Signed)
 Emergency Medicine Provider Triage Evaluation Note  RUMI KOLODZIEJ , a 34 y.o. female  was evaluated in triage.  Pt complains of cough, congestion, body aches started yesterday. ST started today. Has tried cough drops, tylenol . Around friend who has COVID recently. No fevers but chills  Also complaining of CP, shob intermittent for past three days. Worsens with deep breath, coughing. Has hx of asthma and has been wheezing. No breathing treatments nor inhaler at home  Also complains of HA that started yesterday. Started off as slight HA and has worsened since yesterday. No blurred vision  Review of Systems  Positive: See hpi Negative:   Physical Exam  BP (!) 136/98 (BP Location: Right Arm)   Pulse 89   Temp 99 F (37.2 C) (Oral)   Resp 18   Ht 5' 3 (1.6 m)   Wt 108.9 kg   LMP 02/20/2024 (Exact Date)   SpO2 100%   BMI 42.51 kg/m  Gen:   Awake, no distress   Resp:  Normal effort  MSK:   Moves extremities without difficulty  Other:  Expiratory wheezing in triage in all lobes. 1 pitting edema bilaterally. Motor 5/5 and sensation 2/2 of bue and ble  Medical Decision Making  Medically screening exam initiated at 11:47 PM.  Appropriate orders placed.  GLORIANNA GOTT was informed that the remainder of the evaluation will be completed by another provider, this initial triage assessment does not replace that evaluation, and the importance of remaining in the ED until their evaluation is complete.  Provided patient duoneb in triage. Resp panel and CXR pending Doubt ICH or SAH with no max onset HA    Minnie Tinnie BRAVO, [place] 03/12/24 2358

## 2024-03-13 ENCOUNTER — Emergency Department (HOSPITAL_COMMUNITY): Payer: MEDICAID

## 2024-03-13 LAB — CBC WITH DIFFERENTIAL/PLATELET
Abs Immature Granulocytes: 0.03 K/uL (ref 0.00–0.07)
Basophils Absolute: 0 K/uL (ref 0.0–0.1)
Basophils Relative: 0 %
Eosinophils Absolute: 0.2 K/uL (ref 0.0–0.5)
Eosinophils Relative: 2 %
HCT: 36.6 % (ref 36.0–46.0)
Hemoglobin: 11.6 g/dL — ABNORMAL LOW (ref 12.0–15.0)
Immature Granulocytes: 0 %
Lymphocytes Relative: 39 %
Lymphs Abs: 4.1 K/uL — ABNORMAL HIGH (ref 0.7–4.0)
MCH: 27.8 pg (ref 26.0–34.0)
MCHC: 31.7 g/dL (ref 30.0–36.0)
MCV: 87.6 fL (ref 80.0–100.0)
Monocytes Absolute: 0.6 K/uL (ref 0.1–1.0)
Monocytes Relative: 5 %
Neutro Abs: 5.5 K/uL (ref 1.7–7.7)
Neutrophils Relative %: 54 %
Platelets: 352 K/uL (ref 150–400)
RBC: 4.18 MIL/uL (ref 3.87–5.11)
RDW: 17.7 % — ABNORMAL HIGH (ref 11.5–15.5)
WBC: 10.4 K/uL (ref 4.0–10.5)
nRBC: 0 % (ref 0.0–0.2)

## 2024-03-13 LAB — BASIC METABOLIC PANEL WITH GFR
Anion gap: 12 (ref 5–15)
BUN: 12 mg/dL (ref 6–20)
CO2: 21 mmol/L — ABNORMAL LOW (ref 22–32)
Calcium: 9.1 mg/dL (ref 8.9–10.3)
Chloride: 101 mmol/L (ref 98–111)
Creatinine, Ser: 0.79 mg/dL (ref 0.44–1.00)
GFR, Estimated: >60 mL/min
Glucose, Bld: 173 mg/dL — ABNORMAL HIGH (ref 70–99)
Potassium: 3.7 mmol/L (ref 3.5–5.1)
Sodium: 134 mmol/L — ABNORMAL LOW (ref 135–145)

## 2024-03-13 LAB — RESP PANEL BY RT-PCR (RSV, FLU A&B, COVID)  RVPGX2
Influenza A by PCR: NEGATIVE
Influenza B by PCR: NEGATIVE
Resp Syncytial Virus by PCR: NEGATIVE
SARS Coronavirus 2 by RT PCR: NEGATIVE

## 2024-03-13 LAB — TROPONIN T, HIGH SENSITIVITY: Troponin T High Sensitivity: <15 ng/L (ref 0–19)

## 2024-03-13 LAB — HCG, SERUM, QUALITATIVE: Preg, Serum: NEGATIVE

## 2024-03-13 MED ORDER — ACETAMINOPHEN 500 MG PO TABS
1000.0000 mg | ORAL_TABLET | Freq: Once | ORAL | Status: AC
  Administered 2024-03-13: 1000 mg via ORAL
  Filled 2024-03-13: qty 2

## 2024-03-13 MED ORDER — ALBUTEROL SULFATE HFA 108 (90 BASE) MCG/ACT IN AERS
2.0000 | INHALATION_SPRAY | Freq: Once | RESPIRATORY_TRACT | Status: AC
  Administered 2024-03-13: 2 via RESPIRATORY_TRACT
  Filled 2024-03-13: qty 6.7

## 2024-03-13 NOTE — Discharge Instructions (Signed)
 You were seen today for upper respiratory symptoms.  Use the inhaler as needed.  Given your cough and upper respiratory symptoms, would advise that you stop smoking.  Take Tylenol  or ibuprofen  as needed for body aches or pains.
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
## 2024-03-13 NOTE — ED Notes (Signed)
 PA said 2nd trop not needed

## 2024-04-12 ENCOUNTER — Other Ambulatory Visit: Payer: Self-pay

## 2024-04-12 NOTE — Telephone Encounter (Signed)
 Patient calls nurse line requesting refill on Metformin .   Advised patient that she is due for diabetes follow up, given last visit was in 2024.  She is currently on vacation in [place] . She is asking if PCP could send in prescription to [facility] on [address] in [place], [place].   We scheduled her a follow up visit with PCP on 05/24/24.  Please advise.   Chiquita JAYSON English, RN

## 2024-04-13 MED ORDER — METFORMIN HCL 1000 MG PO TABS: 1000.0000 mg | ORAL_TABLET | Freq: Two times a day (BID) | ORAL | 0 refills | Status: AC

## 2024-05-24 ENCOUNTER — Ambulatory Visit: Payer: MEDICAID | Admitting: Family Medicine
